# Patient Record
Sex: Female | Born: 1940
Health system: Southern US, Community
[De-identification: ages and names within clinical notes are randomized; demographics above are authoritative.]

## PROBLEM LIST (undated history)

## (undated) DIAGNOSIS — R32 Unspecified urinary incontinence: Secondary | ICD-10-CM

## (undated) DIAGNOSIS — J302 Other seasonal allergic rhinitis: Secondary | ICD-10-CM

## (undated) DIAGNOSIS — E079 Disorder of thyroid, unspecified: Secondary | ICD-10-CM

## (undated) DIAGNOSIS — M1990 Unspecified osteoarthritis, unspecified site: Secondary | ICD-10-CM

## (undated) DIAGNOSIS — E039 Hypothyroidism, unspecified: Secondary | ICD-10-CM

## (undated) HISTORY — DX: Disorder of thyroid, unspecified: E07.9

## (undated) HISTORY — PX: FOOT SURGERY: SHX648

## (undated) HISTORY — PX: DILATION AND CURETTAGE OF UTERUS: SHX78

## (undated) HISTORY — DX: Unspecified urinary incontinence: R32

## (undated) HISTORY — PX: TONSILLECTOMY: SUR1361

---

## 1999-04-18 ENCOUNTER — Other Ambulatory Visit: Admission: RE | Admit: 1999-04-18 | Discharge: 1999-04-18 | Payer: Self-pay | Admitting: Obstetrics and Gynecology

## 2000-07-14 ENCOUNTER — Other Ambulatory Visit: Admission: RE | Admit: 2000-07-14 | Discharge: 2000-07-14 | Payer: Self-pay | Admitting: Obstetrics and Gynecology

## 2001-07-14 ENCOUNTER — Other Ambulatory Visit: Admission: RE | Admit: 2001-07-14 | Discharge: 2001-07-14 | Payer: Self-pay | Admitting: Obstetrics and Gynecology

## 2002-08-24 ENCOUNTER — Other Ambulatory Visit: Admission: RE | Admit: 2002-08-24 | Discharge: 2002-08-24 | Payer: Self-pay | Admitting: Obstetrics and Gynecology

## 2003-08-28 ENCOUNTER — Other Ambulatory Visit: Admission: RE | Admit: 2003-08-28 | Discharge: 2003-08-28 | Payer: Self-pay | Admitting: Obstetrics and Gynecology

## 2008-12-28 ENCOUNTER — Ambulatory Visit (HOSPITAL_COMMUNITY): Admission: RE | Admit: 2008-12-28 | Discharge: 2008-12-28 | Payer: Self-pay | Admitting: Obstetrics and Gynecology

## 2008-12-28 ENCOUNTER — Encounter (INDEPENDENT_AMBULATORY_CARE_PROVIDER_SITE_OTHER): Payer: Self-pay | Admitting: Obstetrics and Gynecology

## 2010-12-07 ENCOUNTER — Encounter: Payer: Self-pay | Admitting: Obstetrics and Gynecology

## 2011-03-04 LAB — CBC
HCT: 39.8 % (ref 36.0–46.0)
Hemoglobin: 13.2 g/dL (ref 12.0–15.0)
MCHC: 33.3 g/dL (ref 30.0–36.0)
MCV: 96.6 fL (ref 78.0–100.0)
Platelets: 207 10*3/uL (ref 150–400)
RBC: 4.12 MIL/uL (ref 3.87–5.11)
RDW: 13.2 % (ref 11.5–15.5)
WBC: 6.8 10*3/uL (ref 4.0–10.5)

## 2011-04-01 NOTE — Op Note (Signed)
Brenda Ortiz, Brenda Ortiz           ACCOUNT NO.:  [id number]   MEDICAL RECORD NO.:  [id number]          PATIENT TYPE:  AMB   LOCATION:  SDC                           FACILITY:  [facility]   PHYSICIAN:  Lenoard Aden, M.D.DATE OF BIRTH:  November 08, 1941   DATE OF PROCEDURE:  12/28/2008  DATE OF DISCHARGE:                               OPERATIVE REPORT   PREOPERATIVE DIAGNOSIS:  Postmenopausal bleeding.   POSTOPERATIVE DIAGNOSIS:  Postmenopausal bleeding plus probable atrophic  endometrium.   PROCEDURE:  Diagnostic hysteroscopy, D and C   SURGEON:  Lenoard Aden, MD   ANESTHESIA:  MAC paracervical.   ESTIMATED BLOOD LOSS:  Less than 50 mL.   COMPLICATIONS:  None.   FLUID DEFICIT:  30 mL.  The patient to recovery in good condition.   SPECIMEN:  Endometrial curettings to pathology.   BRIEF OPERATIVE NOTE:  After being apprised of risks of anesthesia,  infection, bleeding, injury to organs, need for repair delayed versus  immediate complications secondary to uterine perforation with possible  need for repair.  The patient was brought to the operating room.  She  was administered IV sedation without difficulty, prepped and draped in  usual sterile fashion.  Catheterized until bladder was empty.  Speculum  placed to paracervical block, placed at 4 and 8 o'clock using a dilute  Marcaine solution 22 mL total, a dilute Pitressin solution placed at 3  and 9 o'clock to the cervicovaginal junction.  Good hemostasis is noted.  No intravascular extravasation was noted.  At this time, the cervix is  easily was sounded to 8 cm.  Uterus sounds to 8 cm.  Cervix easily  dilated up to #25 [facility] dilator.  Hysteroscope placed.  Visualization  reveals a nontraumatized endometrial cavity.  No evidence of  perforation.  Normal tubal ostia.  Normal thin anterior and posterior  endometrial wall.  No focal lesions noted.  D&C is then performed in a  four quadrant method using sharp curettage.   Revisualization reveals no  evidence of perforation, otherwise normal endometrial cavity.  Procedure  is therefore terminated.  All instruments removed.  The patient  tolerates procedure, was awakened and transferred to recovery in good  condition.      Lenoard Aden, M.D.  Electronically Signed     RJT/MEDQ  D:  12/28/2008  T:  12/28/2008  Job:  [id number]

## 2013-02-07 ENCOUNTER — Other Ambulatory Visit: Payer: Self-pay | Admitting: Gastroenterology

## 2013-11-03 ENCOUNTER — Encounter: Payer: Self-pay | Admitting: Podiatrist

## 2013-11-03 ENCOUNTER — Ambulatory Visit (INDEPENDENT_AMBULATORY_CARE_PROVIDER_SITE_OTHER): Payer: Medicare Other

## 2013-11-03 ENCOUNTER — Ambulatory Visit (INDEPENDENT_AMBULATORY_CARE_PROVIDER_SITE_OTHER): Payer: Medicare Other | Admitting: Podiatrist

## 2013-11-03 VITALS — BP 112/62 | HR 60 | Resp 16 | Ht 66.0 in | Wt 140.0 lb

## 2013-11-03 DIAGNOSIS — Q828 Other specified congenital malformations of skin: Secondary | ICD-10-CM

## 2013-11-03 DIAGNOSIS — R52 Pain, unspecified: Secondary | ICD-10-CM

## 2013-11-03 DIAGNOSIS — M216X9 Other acquired deformities of unspecified foot: Secondary | ICD-10-CM

## 2013-11-03 DIAGNOSIS — M2580 Other specified joint disorders, unspecified joint: Secondary | ICD-10-CM

## 2013-11-03 DIAGNOSIS — M948X9 Other specified disorders of cartilage, unspecified sites: Secondary | ICD-10-CM

## 2013-11-03 NOTE — Patient Instructions (Signed)
Try the little offloading pad in your shoe-- if it helps, a custom orthotic to wear in your shoes may also be beneficial.  Your small bone underneath your great toe joint may also be inflammed-- if the padding doesn't help we can try a steroid injection into the area to reduce the pain.

## 2013-11-03 NOTE — Progress Notes (Signed)
   Subjective:    Patient ID: Brenda Ortiz, female    DOB: 12-25-1940, 72 y.o.   MRN: [id number]   "My right Ortiz hurts where the callus is.  My left Ortiz hurts on the ball of the Ortiz, sharp pains go through it."  Ortiz Pain This is a new (Pain 1st Metatarsal B/L) problem. Episode onset: Couple of months. The problem occurs intermittently. The problem has been waxing and waning. Nothing aggravates the symptoms. She has tried nothing for the symptoms.      Review of Systems  Genitourinary: Positive for urgency.  All other systems reviewed and are negative.       Objective:   Physical Exam GENERAL APPEARANCE: Alert, conversant. Appropriately groomed. No acute distress.  VASCULAR: Pedal pulses palpable and strong bilateral.  Capillary refill time is immediate to all digits,  Proximal to distal cooling it warm to warm.  Digital hair growth is present bilateral  NEUROLOGIC: sensation is intact epicritically and protectively to 5.07 monofilament at 5/5 sites bilateral.  Light touch is intact bilateral, vibratory sensation intact bilateral, achilles tendon reflex is intact bilateral.  MUSCULOSKELETAL: acceptable muscle strength, tone and stability bilateral.  High arched Ortiz type is noted with forefoot cavus deformity present. Contracture of lesser digits with nonweightbearing is noted. She also has pain in the sesamoid region of the left Ortiz as well.  DERMATOLOGIC: Hyperkeratotic lesion submetatarsal region is noted due to the cavus deformity and significant pressure being put forth on the metatarsal heads. No breakdown in integument is seen.      Assessment & Plan:  Cavus Ortiz deformity bilateral, sesamoiditis, hyperkeratotic porokeratotic lesion Plan: Lightly debrided the hyperkeratotic lesion. Recommended an offloading pad and discussed if this is beneficial a custom orthotic would be helpful for her Ortiz. He would need to be a very thin orthotic as she likes to wear cute  shoes

## 2015-06-11 ENCOUNTER — Encounter (HOSPITAL_COMMUNITY): Payer: Self-pay | Admitting: Internal Medicine

## 2015-06-11 ENCOUNTER — Observation Stay (HOSPITAL_COMMUNITY)
Admission: AD | Admit: 2015-06-11 | Discharge: 2015-06-12 | Disposition: A | Discharge status: Home / Self Care | Payer: Medicare Other | Source: Ambulatory Visit | Attending: Internal Medicine | Admitting: Internal Medicine

## 2015-06-11 DIAGNOSIS — E876 Hypokalemia: Secondary | ICD-10-CM | POA: Diagnosis not present

## 2015-06-11 DIAGNOSIS — R079 Chest pain, unspecified: Principal | ICD-10-CM | POA: Diagnosis present

## 2015-06-11 DIAGNOSIS — R32 Unspecified urinary incontinence: Secondary | ICD-10-CM | POA: Insufficient documentation

## 2015-06-11 DIAGNOSIS — E039 Hypothyroidism, unspecified: Secondary | ICD-10-CM | POA: Diagnosis not present

## 2015-06-11 DIAGNOSIS — Z87891 Personal history of nicotine dependence: Secondary | ICD-10-CM | POA: Diagnosis not present

## 2015-06-11 LAB — COMPREHENSIVE METABOLIC PANEL
ALBUMIN: 4.1 g/dL (ref 3.5–5.0)
ALT: 16 U/L (ref 14–54)
ANION GAP: 8 (ref 5–15)
AST: 24 U/L (ref 15–41)
Alkaline Phosphatase: 45 U/L (ref 38–126)
BILIRUBIN TOTAL: 0.8 mg/dL (ref 0.3–1.2)
BUN: 12 mg/dL (ref 6–20)
CALCIUM: 9.3 mg/dL (ref 8.9–10.3)
CHLORIDE: 104 mmol/L (ref 101–111)
CO2: 29 mmol/L (ref 22–32)
Creatinine, Ser: 0.92 mg/dL (ref 0.44–1.00)
GFR calc non Af Amer: >60 mL/min (ref >60)
Glucose, Bld: 95 mg/dL (ref 65–99)
POTASSIUM: 3.4 mmol/L — AB (ref 3.5–5.1)
SODIUM: 141 mmol/L (ref 135–145)
Total Protein: 7.1 g/dL (ref 6.5–8.1)

## 2015-06-11 LAB — CBC WITH DIFFERENTIAL/PLATELET
Basophils Absolute: 0.1 10*3/uL (ref 0.0–0.1)
Basophils Relative: 1 % (ref 0–1)
EOS ABS: 0.4 10*3/uL (ref 0.0–0.7)
Eosinophils Relative: 6 % — ABNORMAL HIGH (ref 0–5)
HCT: 43.1 % (ref 36.0–46.0)
Hemoglobin: 14.4 g/dL (ref 12.0–15.0)
LYMPHS PCT: 38 % (ref 12–46)
Lymphs Abs: 2.7 10*3/uL (ref 0.7–4.0)
MCH: 31.9 pg (ref 26.0–34.0)
MCHC: 33.4 g/dL (ref 30.0–36.0)
MCV: 95.6 fL (ref 78.0–100.0)
Monocytes Absolute: 0.5 10*3/uL (ref 0.1–1.0)
Monocytes Relative: 6 % (ref 3–12)
Neutro Abs: 3.6 10*3/uL (ref 1.7–7.7)
Neutrophils Relative %: 49 % (ref 43–77)
PLATELETS: 222 10*3/uL (ref 150–400)
RBC: 4.51 MIL/uL (ref 3.87–5.11)
RDW: 13.4 % (ref 11.5–15.5)
WBC: 7.2 10*3/uL (ref 4.0–10.5)

## 2015-06-11 LAB — TROPONIN I: Troponin I: <0.03 ng/mL (ref <0.031)

## 2015-06-11 MED ORDER — LEVOTHYROXINE SODIUM 75 MCG PO TABS
75.0000 ug | ORAL_TABLET | Freq: Every day | ORAL | Status: DC
  Administered 2015-06-12: 75 ug via ORAL
  Filled 2015-06-11 (×2): qty 1

## 2015-06-11 MED ORDER — METHOCARBAMOL 500 MG PO TABS
500.0000 mg | ORAL_TABLET | Freq: Four times a day (QID) | ORAL | Status: DC | PRN
  Filled 2015-06-11: qty 1

## 2015-06-11 MED ORDER — DARIFENACIN HYDROBROMIDE ER 7.5 MG PO TB24
7.5000 mg | ORAL_TABLET | Freq: Every day | ORAL | Status: DC
  Administered 2015-06-12: 7.5 mg via ORAL
  Filled 2015-06-11: qty 1

## 2015-06-11 MED ORDER — POTASSIUM CHLORIDE CRYS ER 20 MEQ PO TBCR
20.0000 meq | EXTENDED_RELEASE_TABLET | Freq: Once | ORAL | Status: AC
  Administered 2015-06-11: 20 meq via ORAL
  Filled 2015-06-11: qty 1

## 2015-06-11 MED ORDER — SODIUM CHLORIDE 0.9 % IV SOLN
INTRAVENOUS | Status: AC
  Administered 2015-06-11: 20:00:00 via INTRAVENOUS

## 2015-06-11 MED ORDER — LISINOPRIL 5 MG PO TABS
5.0000 mg | ORAL_TABLET | Freq: Every day | ORAL | Status: DC
  Administered 2015-06-12: 5 mg via ORAL
  Filled 2015-06-11 (×2): qty 1

## 2015-06-11 MED ORDER — ATORVASTATIN CALCIUM 80 MG PO TABS
80.0000 mg | ORAL_TABLET | Freq: Every day | ORAL | Status: DC
  Filled 2015-06-11: qty 1

## 2015-06-11 MED ORDER — TRAMADOL HCL 50 MG PO TABS
50.0000 mg | ORAL_TABLET | Freq: Four times a day (QID) | ORAL | Status: DC | PRN
  Administered 2015-06-11 – 2015-06-12 (×2): 50 mg via ORAL
  Filled 2015-06-11 (×2): qty 1

## 2015-06-11 MED ORDER — ENOXAPARIN SODIUM 40 MG/0.4ML ~~LOC~~ SOLN
40.0000 mg | SUBCUTANEOUS | Status: DC
  Filled 2015-06-11 (×2): qty 0.4

## 2015-06-11 MED ORDER — ACETAMINOPHEN 325 MG PO TABS
650.0000 mg | ORAL_TABLET | Freq: Four times a day (QID) | ORAL | Status: DC | PRN
  Administered 2015-06-11: 650 mg via ORAL
  Filled 2015-06-11: qty 2

## 2015-06-11 MED ORDER — ACETAMINOPHEN 650 MG RE SUPP: 650.0000 mg | Freq: Four times a day (QID) | RECTAL | Status: DC | PRN

## 2015-06-11 MED ORDER — ESTROGENS CONJUGATED 0.625 MG PO TABS
0.6250 mg | ORAL_TABLET | Freq: Every day | ORAL | Status: DC
  Filled 2015-06-11: qty 1

## 2015-06-11 MED ORDER — SODIUM CHLORIDE 0.9 % IJ SOLN
3.0000 mL | Freq: Two times a day (BID) | INTRAMUSCULAR | Status: DC
  Administered 2015-06-11: 3 mL via INTRAVENOUS

## 2015-06-11 MED ORDER — CONJ ESTROG-MEDROXYPROGEST ACE 0.625-2.5 MG PO TABS: 1.0000 | ORAL_TABLET | Freq: Every day | ORAL | Status: DC

## 2015-06-11 MED ORDER — HYDRALAZINE HCL 20 MG/ML IJ SOLN: 10.0000 mg | Freq: Four times a day (QID) | INTRAMUSCULAR | Status: DC | PRN

## 2015-06-11 MED ORDER — MEDROXYPROGESTERONE ACETATE 2.5 MG PO TABS
2.5000 mg | ORAL_TABLET | Freq: Every day | ORAL | Status: DC
  Filled 2015-06-11: qty 1

## 2015-06-11 MED ORDER — ASPIRIN EC 325 MG PO TBEC
325.0000 mg | DELAYED_RELEASE_TABLET | Freq: Every day | ORAL | Status: DC
  Administered 2015-06-11 – 2015-06-12 (×2): 325 mg via ORAL
  Filled 2015-06-11 (×2): qty 1

## 2015-06-11 NOTE — Progress Notes (Signed)
Pt refusing lisinupril, BP 135/63, potassium 3.4 Dr. Selena Batten notified.

## 2015-06-11 NOTE — H&P (Signed)
Brenda Ortiz is an 74 y.o. female.    Pcp: Pearson Grippe   Chief Complaint:  Chest pain HPI: 74 yo female with hx of hypothyroidism c/o chest pain starting yesterday afternoon.  Lasting for about 10 hrs.  Pt states that the pain was left sided under breast and radiated around towards the back. Pt denies fever chills, cough palp, sob, n/v, heartburn.  EKG nsr at 66, nl axis, slight t inversion in the v1-3.  No prior ekg for comparison.  Pt was chest pain free at office and sent for evaluation to hospital.    Past Medical History  Diagnosis Date  . Thyroid disease   . Incontinence     Past Surgical History  Procedure Laterality Date  . Dilation and curettage of uterus    . Foot surgery Right   . Tonsillectomy      Family History  Problem Relation Age of Onset  . Arthritis Mother   . Arthritis Father   . Diabetes Father   . Diabetes Brother   . Diabetes Paternal Grandmother    Social History:  reports that she quit smoking about 44 years ago. Her smoking use included Cigarettes. She has a 5 pack-year smoking history. She has never used smokeless tobacco. She reports that she drinks alcohol. She reports that she does not use illicit drugs.  Allergies: No Known Allergies  Medications Prior to Admission  Medication Sig Dispense Refill  . calcium-vitamin D (OSCAL) 250-125 MG-UNIT per tablet Take 1 tablet by mouth daily.    Marland Kitchen PREMPRO 0.625-2.5 MG per tablet Take 1 tablet by mouth daily.     Marland Kitchen SYNTHROID 75 MCG tablet Take 75 mcg by mouth daily before breakfast.     . VESICARE 5 MG tablet Take 5 mg by mouth daily.       No results found for this or any previous visit (from the past 48 hour(s)). No results found.  Review of Systems  Constitutional: Negative.   HENT: Negative.   Eyes: Negative.   Respiratory: Negative.   Cardiovascular: Positive for chest pain. Negative for palpitations, orthopnea, claudication, leg swelling and PND.  Gastrointestinal: Negative.    Genitourinary: Negative.   Musculoskeletal: Negative.   Skin: Negative.   Neurological: Negative.   Endo/Heme/Allergies: Negative.   Psychiatric/Behavioral: Negative.     Blood pressure 166/73, pulse 63, temperature 97.7 F (36.5 C), temperature source Oral, resp. rate 18, height  (1.676 m), weight 65.046 kg (143 lb 6.4 oz), SpO2 100 %. Physical Exam  Constitutional: She is oriented to person, place, and time. She appears well-developed and well-nourished.  HENT:  Head: Normocephalic and atraumatic.  Mouth/Throat: No oropharyngeal exudate.  Eyes: Conjunctivae and EOM are normal. Pupils are equal, round, and reactive to light. No scleral icterus.  Neck: Normal range of motion. Neck supple. No JVD present. No tracheal deviation present. No thyromegaly present.  Cardiovascular: Normal rate and regular rhythm.  Exam reveals no gallop and no friction rub.   No murmur heard. Respiratory: Effort normal and breath sounds normal. No respiratory distress. She has no wheezes. She has no rales. She exhibits no tenderness.  GI: Soft. Bowel sounds are normal. She exhibits no distension. There is no tenderness. There is no rebound and no guarding.  Musculoskeletal: Normal range of motion. She exhibits no edema or tenderness.  Lymphadenopathy:    She has no cervical adenopathy.  Neurological: She is alert and oriented to person, place, and time. She has normal reflexes. She displays  normal reflexes. No cranial nerve deficit. She exhibits normal muscle tone. Coordination normal.  Skin: Skin is warm and dry. No rash noted. No erythema. No pallor.  Psychiatric: She has a normal mood and affect. Her behavior is normal. Judgment and thought content normal.     Assessment/Plan Cp Tele Trop i q6h x3 Aspirin, lipitor, lisinopril for bp control.  Discuss case with Ganji, outpatient stress testing if cardiac markers negative  Hypothyroidism Cont synthroid  Hypokalemia Replete Check cmp in  am  Pearson Grippe 06/11/2015, 6:47 PM

## 2015-06-12 ENCOUNTER — Observation Stay (HOSPITAL_COMMUNITY): Payer: Medicare Other

## 2015-06-12 DIAGNOSIS — R079 Chest pain, unspecified: Secondary | ICD-10-CM | POA: Diagnosis not present

## 2015-06-12 LAB — CBC WITH DIFFERENTIAL/PLATELET
BASOS PCT: 1 % (ref 0–1)
Basophils Absolute: 0.1 10*3/uL (ref 0.0–0.1)
Eosinophils Absolute: 0.4 10*3/uL (ref 0.0–0.7)
Eosinophils Relative: 7 % — ABNORMAL HIGH (ref 0–5)
HEMATOCRIT: 37.7 % (ref 36.0–46.0)
Hemoglobin: 12.5 g/dL (ref 12.0–15.0)
Lymphocytes Relative: 48 % — ABNORMAL HIGH (ref 12–46)
Lymphs Abs: 2.7 10*3/uL (ref 0.7–4.0)
MCH: 31.7 pg (ref 26.0–34.0)
MCHC: 33.2 g/dL (ref 30.0–36.0)
MCV: 95.7 fL (ref 78.0–100.0)
Monocytes Absolute: 0.5 10*3/uL (ref 0.1–1.0)
Monocytes Relative: 8 % (ref 3–12)
NEUTROS ABS: 2 10*3/uL (ref 1.7–7.7)
Neutrophils Relative %: 36 % — ABNORMAL LOW (ref 43–77)
Platelets: 185 10*3/uL (ref 150–400)
RBC: 3.94 MIL/uL (ref 3.87–5.11)
RDW: 13.5 % (ref 11.5–15.5)
WBC: 5.5 10*3/uL (ref 4.0–10.5)

## 2015-06-12 LAB — TROPONIN I: Troponin I: <0.03 ng/mL (ref <0.031)

## 2015-06-12 LAB — COMPREHENSIVE METABOLIC PANEL
ALT: 12 U/L — ABNORMAL LOW (ref 14–54)
AST: 17 U/L (ref 15–41)
Albumin: 3.1 g/dL — ABNORMAL LOW (ref 3.5–5.0)
Alkaline Phosphatase: 34 U/L — ABNORMAL LOW (ref 38–126)
Anion gap: 6 (ref 5–15)
BUN: 8 mg/dL (ref 6–20)
CO2: 27 mmol/L (ref 22–32)
Calcium: 8.1 mg/dL — ABNORMAL LOW (ref 8.9–10.3)
Chloride: 108 mmol/L (ref 101–111)
Creatinine, Ser: 0.8 mg/dL (ref 0.44–1.00)
GFR calc non Af Amer: >60 mL/min (ref >60)
Glucose, Bld: 96 mg/dL (ref 65–99)
Potassium: 3.9 mmol/L (ref 3.5–5.1)
Sodium: 141 mmol/L (ref 135–145)
Total Bilirubin: 0.6 mg/dL (ref 0.3–1.2)
Total Protein: 5.4 g/dL — ABNORMAL LOW (ref 6.5–8.1)

## 2015-06-12 LAB — LIPID PANEL
Cholesterol: 155 mg/dL (ref 0–200)
HDL: 66 mg/dL (ref >40)
LDL CALC: 80 mg/dL (ref 0–99)
TRIGLYCERIDES: 43 mg/dL (ref <150)
Total CHOL/HDL Ratio: 2.3 RATIO
VLDL: 9 mg/dL (ref 0–40)

## 2015-06-12 MED ORDER — ASPIRIN 325 MG PO TBEC: 325.0000 mg | DELAYED_RELEASE_TABLET | Freq: Every day | ORAL | Status: DC

## 2015-06-12 MED ORDER — ATORVASTATIN CALCIUM 80 MG PO TABS: 80.0000 mg | ORAL_TABLET | Freq: Every day | ORAL | Status: DC

## 2015-06-12 MED ORDER — TRAMADOL HCL 50 MG PO TABS: 50.0000 mg | ORAL_TABLET | Freq: Four times a day (QID) | ORAL | Status: DC | PRN

## 2015-06-12 MED ORDER — METHOCARBAMOL 500 MG PO TABS: 500.0000 mg | ORAL_TABLET | Freq: Four times a day (QID) | ORAL | Status: DC | PRN

## 2015-06-12 NOTE — Discharge Summary (Signed)
Physician Discharge Summary  Patient ID: Brenda Ortiz MRN: [id number] DOB/AGE: 07/17/1941 74 y.o.  Admit date: 06/11/2015 Discharge date: 06/12/2015  Admission Diagnoses:   Chest pain Hypothyroidism Hypokalemia Urinary incontinence  Discharge Diagnoses:  Principal Problem:   Chest pain Active Problems:   Hypothyroidism   Discharged Condition: stable  Hospital Course: 74 yo female with hypothryoidism c/o left sided chest pain starting 1 day prior to admission lasting for about 10 hrs.  Pt was seen in office and sent to hospital for chest pain r/o.  Discussed case with Dr. Jacinto Halim and has stress testing for Friday.  Cp free during admission. Trop markers were negative.  Potassium was repleted.  Pt was continued on aspirin and started on lipitor.   Has some mild left sided back pain, "muscle spasm" .  I warned the patient that there is a small chance of shingles in the differential diagnosis as she has had shingles in the past.  Pt will watch for any rash and let us know immediately if this occurs. Pt's markers were negative and she has follow up arranged as above.   Consults: None  Significant Diagnostic Studies: labs:   Treatments: aspirin, lipitor  Discharge Exam:   Blood pressure 130/66, pulse 60, temperature 97.3 F (36.3 C), temperature source Oral, resp. rate 18, height 5\' 6"  (1.676 m), weight 65.046 kg (143 lb 6.4 oz), SpO2 100 %. Heent: anicteric Neck: no jvd Heart: rrr s1, s2 Lung: ctab Abd: soft, nt, nd, +bs Ext: no c/c/e Skin:  No rash  A/P Cp, resolved Cont aspirin, lipitor Please f/u with Dr. Jerre Simon office for stress test on Friday,  I spoke with [name] to arrange  Hypokalemia Repleted,  Resolved  Hypothyroidism Cont levothyroxine  Back pain Robaxin, tramadol, tylenol prn Pt is aware to watch for signs of shingles  Disposition: Home     Medication List    TAKE these medications        aspirin 325 MG EC tablet  Take 1 tablet (325 mg  total) by mouth daily.     atorvastatin 80 MG tablet  Commonly known as:  LIPITOR  Take 1 tablet (80 mg total) by mouth daily at 6 PM.     calcium-vitamin D 250-125 MG-UNIT per tablet  Commonly known as:  OSCAL  Take 1 tablet by mouth daily.     methocarbamol 500 MG tablet  Commonly known as:  ROBAXIN  Take 1 tablet (500 mg total) by mouth every 6 (six) hours as needed for muscle spasms.     multivitamin with minerals Tabs tablet  Take 1 tablet by mouth daily.     PREMPRO 0.625-2.5 MG per tablet  Generic drug:  estrogen (conjugated)-medroxyprogesterone  Take 1 tablet by mouth daily.     SYNTHROID 75 MCG tablet  Generic drug:  levothyroxine  Take 75 mcg by mouth every morning.     traMADol 50 MG tablet  Commonly known as:  ULTRAM  Take 1 tablet (50 mg total) by mouth every 6 (six) hours as needed for severe pain.     VESICARE 5 MG tablet  Generic drug:  solifenacin  Take 5 mg by mouth daily.           Follow-up Information    Follow up with Pearson Grippe, MD In 3 weeks.   Specialty:  Internal Medicine   Contact information:   [address] [place] [address] [phone]       Follow up with Yates Decamp,  MD In 3 days.   Specialty:  Cardiology   Why:  for stress test   Contact information:   [address] [place] [address] [phone]       Signed: Pearson Grippe 06/12/2015, 9:12 AM

## 2015-06-12 NOTE — Discharge Instructions (Signed)
Please contact pcp if any further chest pain and follow up with Dr. Jacinto Halim

## 2016-07-22 ENCOUNTER — Other Ambulatory Visit: Payer: Self-pay | Admitting: Obstetrics and Gynecology

## 2016-08-07 NOTE — Patient Instructions (Signed)
Your procedure is scheduled on:08/14/16  Enter through the Main Entrance at :8am Pick up desk phone and dial [phone] and inform us of your arrival.  Please call [phone] if you have any problems the morning of surgery.  Remember: Do not eat food or drink liquids, including water, after midnight:WED.   You may brush your teeth the morning of surgery.  Take these meds the morning of surgery with a sip of water:Synthroid  DO NOT wear jewelry, eye make-up, lipstick,body lotion, or dark fingernail polish.  (Polished toes are ok) You may wear deodorant.   Patients discharged on the day of surgery will not be allowed to drive home. Wear loose fitting, comfortable clothes for your ride home.

## 2016-08-08 ENCOUNTER — Encounter (HOSPITAL_COMMUNITY): Payer: Self-pay

## 2016-08-08 ENCOUNTER — Encounter (HOSPITAL_COMMUNITY)
Admission: RE | Admit: 2016-08-08 | Discharge: 2016-08-08 | Disposition: A | Discharge status: Home / Self Care | Payer: Medicare Other | Source: Ambulatory Visit | Attending: Obstetrics and Gynecology | Admitting: Obstetrics and Gynecology

## 2016-08-08 DIAGNOSIS — Z01818 Encounter for other preprocedural examination: Secondary | ICD-10-CM | POA: Diagnosis not present

## 2016-08-08 HISTORY — DX: Hypothyroidism, unspecified: E03.9

## 2016-08-08 LAB — CBC
HCT: 40.7 % (ref 36.0–46.0)
Hemoglobin: 14.1 g/dL (ref 12.0–15.0)
MCH: 32 pg (ref 26.0–34.0)
MCHC: 34.6 g/dL (ref 30.0–36.0)
MCV: 92.5 fL (ref 78.0–100.0)
Platelets: 234 10*3/uL (ref 150–400)
RBC: 4.4 MIL/uL (ref 3.87–5.11)
RDW: 13.9 % (ref 11.5–15.5)
WBC: 6 10*3/uL (ref 4.0–10.5)

## 2016-08-13 MED ORDER — CEFAZOLIN SODIUM-DEXTROSE 2-4 GM/100ML-% IV SOLN
2.0000 g | INTRAVENOUS | Status: AC
Start: 2016-08-14 — End: 2016-08-14
  Administered 2016-08-14: 2 g via INTRAVENOUS

## 2016-08-14 ENCOUNTER — Ambulatory Visit (HOSPITAL_COMMUNITY)
Admission: RE | Admit: 2016-08-14 | Discharge: 2016-08-14 | Disposition: A | Discharge status: Home / Self Care | Payer: Medicare Other | Source: Ambulatory Visit | Attending: Obstetrics and Gynecology | Admitting: Obstetrics and Gynecology

## 2016-08-14 ENCOUNTER — Encounter (HOSPITAL_COMMUNITY)
Admission: RE | Disposition: A | Discharge status: Home / Self Care | Payer: Self-pay | Source: Ambulatory Visit | Attending: Obstetrics and Gynecology

## 2016-08-14 ENCOUNTER — Encounter (HOSPITAL_COMMUNITY): Payer: Self-pay

## 2016-08-14 ENCOUNTER — Ambulatory Visit (HOSPITAL_COMMUNITY): Payer: Medicare Other | Admitting: Anesthesiology

## 2016-08-14 DIAGNOSIS — N840 Polyp of corpus uteri: Secondary | ICD-10-CM | POA: Diagnosis not present

## 2016-08-14 DIAGNOSIS — N950 Postmenopausal bleeding: Secondary | ICD-10-CM | POA: Diagnosis present

## 2016-08-14 DIAGNOSIS — Z7982 Long term (current) use of aspirin: Secondary | ICD-10-CM | POA: Insufficient documentation

## 2016-08-14 DIAGNOSIS — Z87891 Personal history of nicotine dependence: Secondary | ICD-10-CM | POA: Diagnosis not present

## 2016-08-14 HISTORY — PX: HYSTEROSCOPY WITH D & C: SHX1775

## 2016-08-14 SURGERY — DILATATION AND CURETTAGE /HYSTEROSCOPY
Anesthesia: General | Site: Vagina

## 2016-08-14 MED ORDER — LACTATED RINGERS IV SOLN
INTRAVENOUS | Status: DC
Start: 2016-08-14 — End: 2016-08-14
  Administered 2016-08-14 (×2): via INTRAVENOUS

## 2016-08-14 MED ORDER — PROPOFOL 10 MG/ML IV BOLUS
INTRAVENOUS | Status: DC | PRN
Start: 2016-08-14 — End: 2016-08-14
  Administered 2016-08-14: 200 mg via INTRAVENOUS

## 2016-08-14 MED ORDER — HYDROMORPHONE HCL 1 MG/ML IJ SOLN
INTRAMUSCULAR | Status: AC
  Filled 2016-08-14: qty 1

## 2016-08-14 MED ORDER — ONDANSETRON HCL 4 MG/2ML IJ SOLN: 4.0000 mg | Freq: Once | INTRAMUSCULAR | Status: DC | PRN

## 2016-08-14 MED ORDER — BUPIVACAINE HCL (PF) 0.25 % IJ SOLN
INTRAMUSCULAR | Status: AC
  Filled 2016-08-14: qty 30

## 2016-08-14 MED ORDER — LIDOCAINE HCL (CARDIAC) 20 MG/ML IV SOLN
INTRAVENOUS | Status: DC | PRN
  Administered 2016-08-14: 100 mg via INTRAVENOUS

## 2016-08-14 MED ORDER — MIDAZOLAM HCL 5 MG/5ML IJ SOLN: INTRAMUSCULAR | Status: DC | PRN

## 2016-08-14 MED ORDER — LACTATED RINGERS IR SOLN
Status: DC | PRN
  Administered 2016-08-14: 3000 mL

## 2016-08-14 MED ORDER — BUPIVACAINE HCL (PF) 0.25 % IJ SOLN
INTRAMUSCULAR | Status: DC | PRN
  Administered 2016-08-14: 20 mL

## 2016-08-14 MED ORDER — TRAMADOL HCL 50 MG PO TABS: 50.0000 mg | ORAL_TABLET | Freq: Four times a day (QID) | ORAL | 0 refills | Status: DC | PRN

## 2016-08-14 MED ORDER — KETOROLAC TROMETHAMINE 30 MG/ML IJ SOLN
INTRAMUSCULAR | Status: DC | PRN
  Administered 2016-08-14: 30 mg via INTRAVENOUS

## 2016-08-14 MED ORDER — ONDANSETRON HCL 4 MG/2ML IJ SOLN
INTRAMUSCULAR | Status: DC | PRN
  Administered 2016-08-14: 4 mg via INTRAVENOUS

## 2016-08-14 MED ORDER — LIDOCAINE HCL (CARDIAC) 20 MG/ML IV SOLN
INTRAVENOUS | Status: AC
  Filled 2016-08-14: qty 5

## 2016-08-14 MED ORDER — KETOROLAC TROMETHAMINE 30 MG/ML IJ SOLN
INTRAMUSCULAR | Status: AC
  Filled 2016-08-14: qty 1

## 2016-08-14 MED ORDER — FENTANYL CITRATE (PF) 100 MCG/2ML IJ SOLN: 25.0000 ug | INTRAMUSCULAR | Status: DC | PRN

## 2016-08-14 MED ORDER — DEXAMETHASONE SODIUM PHOSPHATE 10 MG/ML IJ SOLN
INTRAMUSCULAR | Status: AC
  Filled 2016-08-14: qty 1

## 2016-08-14 MED ORDER — DEXAMETHASONE SODIUM PHOSPHATE 4 MG/ML IJ SOLN
INTRAMUSCULAR | Status: DC | PRN
  Administered 2016-08-14: 10 mg via INTRAVENOUS

## 2016-08-14 MED ORDER — ONDANSETRON HCL 4 MG/2ML IJ SOLN
INTRAMUSCULAR | Status: AC
  Filled 2016-08-14: qty 2

## 2016-08-14 MED ORDER — FENTANYL CITRATE (PF) 100 MCG/2ML IJ SOLN
INTRAMUSCULAR | Status: AC
  Filled 2016-08-14: qty 2

## 2016-08-14 MED ORDER — PROPOFOL 10 MG/ML IV BOLUS
INTRAVENOUS | Status: AC
  Filled 2016-08-14: qty 20

## 2016-08-14 SURGICAL SUPPLY — 15 items
CANISTER SUCT 3000ML (MISCELLANEOUS) ×3 IMPLANT
CATH ROBINSON RED A/P 16FR (CATHETERS) ×3 IMPLANT
CLOTH BEACON ORANGE TIMEOUT ST (SAFETY) ×3 IMPLANT
CONTAINER PREFILL 10% NBF 60ML (FORM) ×3 IMPLANT
GLOVE BIO SURGEON STRL SZ7.5 (GLOVE) ×3 IMPLANT
GLOVE BIOGEL PI IND STRL 7.0 (GLOVE) ×1 IMPLANT
GLOVE BIOGEL PI INDICATOR 7.0 (GLOVE) ×2
GOWN STRL REUS W/TWL LRG LVL3 (GOWN DISPOSABLE) ×9 IMPLANT
PACK VAGINAL MINOR WOMEN LF (CUSTOM PROCEDURE TRAY) ×3 IMPLANT
PAD OB MATERNITY 4.3X12.25 (PERSONAL CARE ITEMS) ×3 IMPLANT
SYR TB 1ML 25GX5/8 (SYRINGE) IMPLANT
TOWEL OR 17X24 6PK STRL BLUE (TOWEL DISPOSABLE) ×6 IMPLANT
TUBING AQUILEX INFLOW (TUBING) ×3 IMPLANT
TUBING AQUILEX OUTFLOW (TUBING) ×3 IMPLANT
WATER STERILE IRR 1000ML POUR (IV SOLUTION) ×3 IMPLANT

## 2016-08-14 NOTE — Transfer of Care (Signed)
Immediate Anesthesia Transfer of Care Note  Patient: Brenda Ortiz  Procedure(s) Performed: Procedure(s): DILATATION AND CURETTAGE /HYSTEROSCOPY (N/A)  Patient Location: PACU  Anesthesia Type:General  Level of Consciousness: awake, alert  and oriented  Airway & Oxygen Therapy: Patient Spontanous Breathing and Patient connected to nasal cannula oxygen  Post-op Assessment: Report given to RN and Post -op Vital signs reviewed and stable  Post vital signs: Reviewed and stable  Last Vitals:  Vitals:   08/14/16 0821  BP: (!) 151/55  Pulse: 69  Resp: 14  Temp: 36.4 C    Last Pain:  Vitals:   08/14/16 0821  TempSrc: Oral      Patients Stated Pain Goal:  (pt doesn't wany anything for pain narcotic wise) (08/14/16 16100821)  Complications: No apparent anesthesia complications

## 2016-08-14 NOTE — Op Note (Signed)
08/14/2016  10:44 AM  PATIENT:  Epimenio FootMargaret B Ranes  75 y.o. female  PRE-OPERATIVE DIAGNOSIS:  Postmenopausal Bleeding  POST-OPERATIVE DIAGNOSIS:  Postmenopausal Bleeding  PROCEDURE:  Procedure(s): DILATATION AND CURETTAGE /HYSTEROSCOPY REMOVAL of endometrial polyp  SURGEON:  Surgeon(s): Olivia Mackieichard Teralyn Mullins, MD  ASSISTANTS: none   ANESTHESIA:   local and general  ESTIMATED BLOOD LOSS: minimal  DRAINS: none   LOCAL MEDICATIONS USED:  MARCAINE    and Amount: 20 ml  SPECIMEN:  Source of Specimen:  EMC and polyp  DISPOSITION OF SPECIMEN:  PATHOLOGY  COUNTS:  YES  DICTATION #: [id number]  PLAN OF CARE: dc home  PATIENT DISPOSITION:  PACU - hemodynamically stable.

## 2016-08-14 NOTE — Discharge Instructions (Signed)
DISCHARGE INSTRUCTIONS: HYSTEROSCOPY The following instructions have been prepared to help you care for yourself upon your return home.  May take Ibuprofen after 4:00 pm 08/14/16.   Personal hygiene:  Use sanitary pads for vaginal drainage, not tampons.  Shower the day after your procedure.  NO tub baths, pools or Jacuzzis for 2-3 weeks.  Wipe front to back after using the bathroom.  Activity and limitations:  Do NOT drive or operate any equipment for 24 hours. The effects of anesthesia are still present and drowsiness may result.  Do NOT rest in bed all day.  Walking is encouraged.  Walk up and down stairs slowly.  You may resume your normal activity in one to two days or as indicated by your physician. Sexual activity: NO intercourse for at least 2 weeks after the procedure, or as indicated by your Doctor.  Diet: Eat a light meal as desired this evening. You may resume your usual diet tomorrow.  Return to Work: You may resume your work activities in one to two days or as indicated by Therapist, sportsyour Doctor.  What to expect after your surgery: Expect to have vaginal bleeding/discharge for 2-3 days and spotting for up to 10 days. It is not unusual to have soreness for up to 1-2 weeks. You may have a slight burning sensation when you urinate for the first day. Mild cramps may continue for a couple of days. You may have a regular period in 2-6 weeks.  Call your doctor for any of the following:  Excessive vaginal bleeding or clotting, saturating and changing one pad every hour.  Inability to urinate 6 hours after discharge from hospital.  Pain not relieved by pain medication.  Fever of 100.4 F or greater.  Unusual vaginal discharge or odor.  Return to office _________________Call for an appointment ___________________ Patients signature: ______________________ Nurses signature ________________________  Post Anesthesia Care Unit [phone]

## 2016-08-14 NOTE — H&P (Signed)
Brenda FootMargaret B Ortiz is an 75 y.o. female with PMB. Nl ebx. Recurrent PMB. Diag HS today.  Pertinent Gynecological History: Menses: post-menopausal Bleeding: post menopausal bleeding Contraception: none DES exposure: denies Blood transfusions: none Sexually transmitted diseases: no past history Previous GYN Procedures: na  Last mammogram: normal Date: 2017 Last pap: normal Date: 2017 OB History: G2, P2   Menstrual History: Menarche age: [age] No LMP recorded. Patient is postmenopausal.    Past Medical History:  Diagnosis Date  . Hypothyroidism   . Incontinence   . Thyroid disease     Past Surgical History:  Procedure Laterality Date  . DILATION AND CURETTAGE OF UTERUS    . Ortiz SURGERY Right   . TONSILLECTOMY      Family History  Problem Relation Age of Onset  . Arthritis Mother   . Arthritis Father   . Diabetes Father   . Diabetes Brother   . Diabetes Paternal Grandmother     Social History:  reports that she quit smoking about 45 years ago. Her smoking use included Cigarettes. She has a 5.00 pack-year smoking history. She has never used smokeless tobacco. She reports that she drinks alcohol. She reports that she does not use drugs.  Allergies: No Known Allergies  Prescriptions Prior to Admission  Medication Sig Dispense Refill Last Dose  . calcium-vitamin D (OSCAL) 250-125 MG-UNIT per tablet Take 1 tablet by mouth daily.   08/13/2016 at Unknown time  . mirabegron ER (MYRBETRIQ) 50 MG TB24 tablet Take 50 mg by mouth every morning.   08/13/2016 at Unknown time  . Multiple Vitamin (MULTIVITAMIN WITH MINERALS) TABS tablet Take 1 tablet by mouth daily.   08/13/2016 at Unknown time  . PREMPRO 0.625-2.5 MG per tablet Take 1 tablet by mouth daily.    08/13/2016 at Unknown time  . SYNTHROID 75 MCG tablet Take 75 mcg by mouth every morning.    08/14/2016 at 0715  . aspirin EC 325 MG EC tablet Take 1 tablet (325 mg total) by mouth daily. 30 tablet 0 Unknown at Unknown time  .  atorvastatin (LIPITOR) 80 MG tablet Take 1 tablet (80 mg total) by mouth daily at 6 PM. (Patient not taking: Reported on 08/06/2016) 30 tablet 0 Not Taking at Unknown time  . methocarbamol (ROBAXIN) 500 MG tablet Take 1 tablet (500 mg total) by mouth every 6 (six) hours as needed for muscle spasms. (Patient not taking: Reported on 08/06/2016) 60 tablet 0 Not Taking at Unknown time  . traMADol (ULTRAM) 50 MG tablet Take 1 tablet (50 mg total) by mouth every 6 (six) hours as needed for severe pain. (Patient not taking: Reported on 08/06/2016) 30 tablet 0 Not Taking at Unknown time    Review of Systems  Constitutional: Negative.   All other systems reviewed and are negative.   There were no vitals taken for this visit. Physical Exam  Nursing note and vitals reviewed. Constitutional: She is oriented to person, place, and time. She appears well-developed and well-nourished.  HENT:  Head: Normocephalic and atraumatic.  Neck: Normal range of motion. Neck supple. No thyromegaly present.  Cardiovascular: Normal rate and regular rhythm.   Respiratory: Effort normal and breath sounds normal.  GI: Soft. Bowel sounds are normal.  Genitourinary: Vagina normal. Rectal exam shows guaiac positive stool.  Musculoskeletal: Normal range of motion.  Neurological: She is alert and oriented to person, place, and time. She has normal reflexes.  Skin: Skin is warm and dry.  Psychiatric: She has a normal mood  and affect.    No results found for this or any previous visit (from the past 24 hour(s)).  No results found.  Assessment/Plan: PMB- recurrrent- nl sono, nl ebx Diag HS with D&C. Consent done.  Brenda Ortiz J 08/14/2016, 8:29 AM

## 2016-08-14 NOTE — Anesthesia Preprocedure Evaluation (Signed)
Anesthesia Evaluation  Patient identified by MRN, date of birth, ID band Patient awake    Reviewed: Allergy & Precautions, NPO status , Patient's Chart, lab work & pertinent test results  History of Anesthesia Complications Negative for: history of anesthetic complications  Airway Mallampati: II  TM Distance: >3 FB Neck ROM: Full    Dental no notable dental hx. (+) Dental Advisory Given   Pulmonary neg pulmonary ROS, former smoker,    Pulmonary exam normal breath sounds clear to auscultation       Cardiovascular negative cardio ROS Normal cardiovascular exam Rhythm:Regular Rate:Normal     Neuro/Psych negative neurological ROS  negative psych ROS   GI/Hepatic negative GI ROS, Neg liver ROS,   Endo/Other  Hypothyroidism   Renal/GU negative Renal ROS  negative genitourinary   Musculoskeletal negative musculoskeletal ROS (+)   Abdominal   Peds negative pediatric ROS (+)  Hematology negative hematology ROS (+)   Anesthesia Other Findings   Reproductive/Obstetrics negative OB ROS                             Anesthesia Physical Anesthesia Plan  ASA: II  Anesthesia Plan: General   Post-op Pain Management:    Induction: Intravenous  Airway Management Planned: LMA  Additional Equipment:   Intra-op Plan:   Post-operative Plan: Extubation in OR  Informed Consent: I have reviewed the patients History and Physical, chart, labs and discussed the procedure including the risks, benefits and alternatives for the proposed anesthesia with the patient or authorized representative who has indicated his/her understanding and acceptance.   Dental advisory given  Plan Discussed with: CRNA  Anesthesia Plan Comments:         Anesthesia Quick Evaluation

## 2016-08-14 NOTE — Anesthesia Procedure Notes (Signed)
Procedure Name: LMA Insertion Date/Time: 08/14/2016 9:40 AM Performed by: Junious SilkGILBERT, Alondria Mousseau Pre-anesthesia Checklist: Patient identified, Emergency Drugs available, Suction available, Patient being monitored and Timeout performed Patient Re-evaluated:Patient Re-evaluated prior to inductionOxygen Delivery Method: Circle system utilized Preoxygenation: Pre-oxygenation with 100% oxygen Intubation Type: IV induction LMA: LMA inserted LMA Size: 4.0 Number of attempts: 1 Placement Confirmation: positive ETCO2,  CO2 detector and breath sounds checked- equal and bilateral Tube secured with: Tape Dental Injury: Teeth and Oropharynx as per pre-operative assessment

## 2016-08-14 NOTE — Progress Notes (Signed)
Patient seen and examined. Consent witnessed and signed. No changes noted. Update completed.Patient ID: Brenda FootMargaret B Ortiz, female   DOB: 1941/06/19, 75 y.o.   MRN: [id number]

## 2016-08-15 ENCOUNTER — Encounter (HOSPITAL_COMMUNITY): Payer: Self-pay | Admitting: Obstetrics and Gynecology

## 2016-08-15 NOTE — Op Note (Signed)
[facility]:  Ortiz, Brenda           ACCOUNT NO.:  [id number]  MEDICAL RECORD NO.:  [id number]  LOCATION:  [facility]                          FACILITY:  [facility]  PHYSICIAN:  Lenoard Adenichard J. Treniyah Lynn, M.D.DATE OF BIRTH:  [birth year]  DATE OF PROCEDURE: DATE OF DISCHARGE:  08/14/2016                              OPERATIVE REPORT   PREOPERATIVE DIAGNOSIS:  Postmenopausal bleeding with negative endometrial biopsy and refractory bleeding.  POSTOPERATIVE DIAGNOSIS:  Postmenopausal bleeding with negative endometrial biopsy and refractory bleeding plus small left endometrial polyp.  PROCEDURE:  Diagnostic hysteroscopy, D and C, endometrial polypectomy.  SURGEON:  Lenoard Adenichard J. Fedra Lanter, M.D.  ASSISTANT:  None.  ANESTHESIA:  General and local.  ESTIMATED BLOOD LOSS:  Less than 50 mL.  FLUID DEFICIT:  75 mL.  COMPLICATIONS:  None.  COUNTS:  Correct.  DISPOSITION:  The patient to recovery in good condition.  BRIEF OPERATIVE NOTE:  After being apprised of risks of anesthesia, infection, bleeding, injury to surrounding organs, possible need for repair, delayed versus immediate complications to include bowel and bladder injury, possible need for repair, the patient was brought to the operating room, and she was administered general anesthetic without complications.  She was prepped and draped in usual sterile fashion. Catheterized until the bladder was empty.  Exam under anesthesia reveals an anteflexed uterus and no adnexal masses appreciated.  No rectovaginal nodularity noted.  At this time, bivalve speculum placed, dilute Marcaine solution, placed 20 mL total standard paracervical block. Cervix easily dilated up to a #23 Pratt dilator.  Hysteroscope placed. Visualization reveals a thin endometrium with 1 small polypoid area along the lateral uterine wall.  By the left tubal ostia, which was removed using biopsy forceps and sent with the endometrial curettings which are correct collected using  sharp curettage in a four-quadrant method.  At this time, all instruments removed.  Good hemostasis was noted.  Fluid deficit as noted.  The patient tolerated the procedure well, was awakened, and transferred to recovery in good condition.     Lenoard Adenichard J. Bohdi Leeds, M.D.     RJT/MEDQ  D:  08/14/2016  T:  08/15/2016  Job:  [id number]  cc:   Lenoard Adenichard J. Mahealani Sulak, M.D. Fax: [phone]

## 2016-08-15 NOTE — Anesthesia Postprocedure Evaluation (Signed)
Anesthesia Post Note  Patient: Brenda Ortiz  Procedure(s) Performed: Procedure(s) (LRB): DILATATION AND CURETTAGE /HYSTEROSCOPY (N/A)  Anesthesia Type: General Level of consciousness: awake and alert Pain management: pain level controlled Vital Signs Assessment: post-procedure vital signs reviewed and stable Respiratory status: spontaneous breathing, nonlabored ventilation, respiratory function stable and patient connected to nasal cannula oxygen Cardiovascular status: blood pressure returned to baseline and stable Postop Assessment: no signs of nausea or vomiting Anesthetic complications: no    Last Vitals:  Vitals:   08/14/16 1115 08/14/16 1150  BP: (!) 150/73 (!) 134/92  Pulse: (!) 57 (!) 57  Resp: 14 14  Temp: 36.6 C 36.5 C    Last Pain:  Vitals:   08/14/16 0821  TempSrc: Oral                 Byren Pankow JENNETTE

## 2017-03-16 ENCOUNTER — Encounter: Payer: Self-pay | Admitting: Podiatry

## 2017-03-16 ENCOUNTER — Ambulatory Visit (INDEPENDENT_AMBULATORY_CARE_PROVIDER_SITE_OTHER): Payer: Medicare Other

## 2017-03-16 ENCOUNTER — Ambulatory Visit (INDEPENDENT_AMBULATORY_CARE_PROVIDER_SITE_OTHER): Payer: Medicare Other | Admitting: Podiatry

## 2017-03-16 VITALS — BP 134/92 | HR 73

## 2017-03-16 DIAGNOSIS — M79673 Pain in unspecified foot: Secondary | ICD-10-CM

## 2017-03-16 DIAGNOSIS — L84 Corns and callosities: Secondary | ICD-10-CM

## 2017-03-16 DIAGNOSIS — Q667 Congenital pes cavus, unspecified foot: Secondary | ICD-10-CM

## 2017-03-16 DIAGNOSIS — R52 Pain, unspecified: Secondary | ICD-10-CM

## 2017-03-16 NOTE — Progress Notes (Signed)
   Subjective:    Patient ID: Brenda Ortiz, female    DOB: June 15, 1941, 76 y.o.   MRN: [id number]  HPI 76 year old female presents the office today for concerns of high arch feet which been ongoing her entire life and she's had foot pain majority of her life because of her foot type. He did have heel surgery when she was younger but no other foot surgery. She gets painful calluses which have been worsening. She Is interested in orthotics. No other complaints.    Review of Systems  All other systems reviewed and are negative.      Objective:   Physical Exam General: AAO x3, NAD  Dermatological: Hyperkertotic lesions bilateral submetatarsal 1 and 5. No underlying ulceration, drainage or signs of infection. No open lesions.   Vascular: Dorsalis Pedis artery and Posterior Tibial artery pedal pulses are 2/4 bilateral with immedate capillary fill time. There is no pain with calf compression, swelling, warmth, erythema.   Neruologic: Grossly intact via light touch bilateral. Vibratory intact via tuning fork bilateral. Protective threshold with Semmes Wienstein monofilament intact to all pedal sites bilateral.   Musculoskeletal: Cavus foot type is present. Tenderness to the hyperkeratotic lesions. No other areas of tenderness are identified.  Muscular strength 5/5 in all groups tested bilateral.  Gait: Unassisted, Nonantalgic.     Assessment & Plan:  76 year old female with symptomatic hyperkeratotic lesions due to cavus foot type -Treatment options discussed including all alternatives, risks, and complications -Etiology of symptoms were discussed -Lesions sharply debrided without complications.  -She would like to proceed with orthotics. She was measured for them today and sent to [facility] labs. Will offload the callus areas.  -RTC in 3 weeks to PUO or sooner if needed.  Ovid Curd, DPM

## 2017-04-09 ENCOUNTER — Other Ambulatory Visit: Payer: Medicare Other

## 2017-04-20 ENCOUNTER — Telehealth: Payer: Self-pay | Admitting: *Deleted

## 2017-04-20 NOTE — Telephone Encounter (Signed)
Called and left message to reschedule missed appt to pick up orthotics

## 2017-04-23 ENCOUNTER — Other Ambulatory Visit: Payer: Medicare Other | Admitting: Orthotics

## 2017-07-13 ENCOUNTER — Ambulatory Visit (INDEPENDENT_AMBULATORY_CARE_PROVIDER_SITE_OTHER): Payer: Medicare Other | Admitting: Sports Medicine

## 2017-07-13 ENCOUNTER — Ambulatory Visit
Admission: RE | Admit: 2017-07-13 | Discharge: 2017-07-13 | Disposition: A | Discharge status: Home / Self Care | Payer: Medicare Other | Source: Ambulatory Visit | Attending: Sports Medicine | Admitting: Sports Medicine

## 2017-07-13 VITALS — BP 116/80 | Ht 66.0 in | Wt 142.0 lb

## 2017-07-13 DIAGNOSIS — M25551 Pain in right hip: Secondary | ICD-10-CM

## 2017-07-13 NOTE — Progress Notes (Signed)
   Subjective:    Patient ID: Brenda Ortiz, female    DOB: 01-06-1941, 76 y.o.   MRN: [id number]  HPI 76 yo female presenting with R hip and thigh pain x 4-6 weeks duration.  No history of falls or injury.  She notes the pain is worse when going from a seated to standing position and with climbing stairs.  She lives in a 3-story home so she has been climbing stairs daily.  She notes most of her pain occurs at nighttime when she lies down.  She reports she has had greater trochanteric bursitis in the past.  Advil helps some, has not tried applying ice or heating pads.  Denies radicular pain but reports sometimes her right thigh also feels sore.  Denies back pain.  Denies numbness and tingling.    Review of Systems  Musculoskeletal: Negative for back pain and joint swelling.  Neurological: Negative for numbness.      Objective:   Physical Exam  76 yo female, NAD, sitting comfortably in exam room.   BP 116/80   Ht 5\' 6"  (1.676 m)   Wt 142 lb (64.4 kg)   BMI 22.92 kg/m   Right hip: Full ROM bilaterally.  IR: 80 Deg, ER: 80 Deg, Flexion: 120 Deg, Extension: 100 Deg, Abduction: 45 Deg, Adduction: 45 Deg Strength IR: 5/5, ER: 5/5, Flexion: 5/5, Extension: 5/5, Abduction: 5/5, Adduction: 5/5 Strength 4/5 with resistance against abduction.  Pelvic alignment unremarkable to inspection and palpation. Standing hip rotation and gait without unsteadiness. Greater trochanter without tenderness to palpation   Mild tenderness to palpation over gluteus medius.  No SI joint tenderness and normal minimal SI movement.    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  No tenderness to palpation over greater troch to suggest bursitis.  Possible she may have some underlying arthritic changes also contributing to her pain, however less likely given absence of groin pain.  -will obtain AP and lateral XR to r/o arthritis  -Recommend PT: 1-2 visits for  pelvic stabilization and hip strengthening home exercise program  -follow up in 6 weeks   Patient seen and evaluated with the resident. I agree with the above plan of care. X-rays were reviewed. There are no significant degenerative changes seen. Proceed with physical therapy as noted above and follow-up with me in 6 weeks. Call with questions or concerns in the interim.

## 2017-07-14 DIAGNOSIS — M25551 Pain in right hip: Secondary | ICD-10-CM | POA: Insufficient documentation

## 2017-07-14 NOTE — Assessment & Plan Note (Signed)
Likely 2/2 gluteus medius tendinopathy given location.  No tenderness to palpation over greater troch to suggest bursitis.  Possible she may have some underlying arthritic changes also contributing to her pain, however less likely given absence of groin pain.  -will obtain AP and lateral XR to r/o arthritis  -Recommend PT: 1-2 visits for pelvic stabilization and hip strengthening home exercise program  -follow up in 6 weeks

## 2017-07-21 ENCOUNTER — Ambulatory Visit: Payer: Medicare Other | Attending: Sports Medicine | Admitting: Physical Therapy

## 2017-07-21 ENCOUNTER — Encounter: Payer: Self-pay | Admitting: Physical Therapy

## 2017-07-21 DIAGNOSIS — M6281 Muscle weakness (generalized): Secondary | ICD-10-CM | POA: Insufficient documentation

## 2017-07-21 DIAGNOSIS — M25551 Pain in right hip: Secondary | ICD-10-CM | POA: Insufficient documentation

## 2017-07-21 DIAGNOSIS — R262 Difficulty in walking, not elsewhere classified: Secondary | ICD-10-CM | POA: Insufficient documentation

## 2017-07-21 NOTE — Therapy (Signed)
[facility] Outpatient Rehabilitation [facility] [address] [place], [place], [address] Phone: [phone]   Fax:  [phone]  Physical Therapy Evaluation  Patient Details  Name: Brenda Ortiz MRN: [id number] Date of Birth: 1941/03/01 Referring Provider: Dr. Reino Bellis   Encounter Date: 07/21/2017      PT End of Session - 07/21/17 1533    Visit Number 1   Number of Visits 8   Date for PT Re-Evaluation 09/15/17   PT Start Time 0933   PT Stop Time 1015   PT Time Calculation (min) 42 min   Activity Tolerance Patient tolerated treatment well   Behavior During Therapy [facility] for tasks assessed/performed      Past Medical History:  Diagnosis Date  . Hypothyroidism   . Incontinence   . Thyroid disease     Past Surgical History:  Procedure Laterality Date  . DILATION AND CURETTAGE OF UTERUS    . FOOT SURGERY Right   . HYSTEROSCOPY W/D&C N/A 08/14/2016   Procedure: DILATATION AND CURETTAGE /HYSTEROSCOPY;  Surgeon: Olivia Mackie, MD;  Location: [facility];  Service: Gynecology;  Laterality: N/A;  . TONSILLECTOMY      There were no vitals filed for this visit.       Subjective Assessment - 07/21/17 0940    Subjective Patient has a 2-3 month history of right hip pain that raidiates into her right thigh. She has increased pain when she goes up stairs and when she sits.    Patient is accompained by: Interpreter   Pertinent History Has had bursitis in the past    Limitations Sitting;Standing;House hold activities  in and out of the car    How long can you sit comfortably? If she sits for too long she has some pain when standing    How long can you stand comfortably? No limit   How long can you walk comfortably? has to walk slow    Currently in Pain? Yes   Pain Score 4    Pain Location Hip   Pain Orientation Right   Pain Descriptors / Indicators Aching   Pain Type Acute pain   Pain Onset More than a month ago   Pain Frequency Constant   Aggravating  Factors  Going up and down stairs    Pain Relieving Factors rest; advil;    Effect of Pain on Daily Activities Pain when goin up and down stairs             [facility] PT Assessment - 07/21/17 0001      Assessment   Medical Diagnosis Right hip pain    Referring Provider Dr. Reino Bellis    Onset Date/Surgical Date --  2-3 weeks prior   Hand Dominance Right   Next MD Visit about 4 weeks    Prior Therapy None      Precautions   Precautions None     Restrictions   Weight Bearing Restrictions No     Balance Screen   Has the patient fallen in the past 6 months No   Has the patient had a decrease in activity level because of a fear of falling?  No   Is the patient reluctant to leave their home because of a fear of falling?  No     Home Environment   Additional Comments 3 sets of stairs in her house.      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  Cognition   Overall Cognitive Status Within Functional Limits for tasks assessed   Attention Focused   Focused Attention Appears intact   Memory Appears intact   Awareness Appears intact     Observation/Other Assessments   Focus on Therapeutic Outcomes (FOTO)  Not given by the front desk      Sensation   Light Touch Appears Intact     Coordination   Gross Motor Movements are Fluid and Coordinated Yes   Fine Motor Movements are Fluid and Coordinated Yes     ROM / Strength   AROM / PROM / Strength AROM;PROM;Strength     AROM   Overall AROM Comments noraml lumbar motion      PROM   Overall PROM Comments minor pain at end range flexion      Strength   Overall Strength Comments left lower extrenmity 5/5    Strength Assessment Site Hip;Knee   Right/Left Hip Right   Right Hip Flexion 3+/5   Right Hip ABduction 3+/5   Right/Left Knee Right;Left   Right Knee Flexion 4+/5   Right Knee Extension 4/5   Left Knee Flexion 5/5   Left Knee Extension 5/5      Flexibility   Soft Tissue Assessment /Muscle Length yes   Hamstrings 90/90 20 degrees bilateral    Piriformis decreased pain with prrifromis stretching      Palpation   Palpation comment Tenderness to plapation around her greater trochanter and iup into the glut/ glut medius area. No tenderness to palpation in the lumbar spine      Special Tests    Special Tests Hip Special Tests   Hip Special Tests  Hip Scouring     Hip Scouring   Comments (-) bilateral      Ambulation/Gait   Gait Comments right foot pronation with gait.             Objective measurements completed on examination: See above findings.          [facility] Adult PT Treatment/Exercise - 07/21/17 0001      Lumbar Exercises: Stretches   Active Hamstring Stretch Limitations 90/90 2x30 sec hold    Single Knee to Chest Stretch Limitations 2x30sec hold    Piriformis Stretch Limitations 2x30 sec hold      Knee/Hip Exercises: Standing   Other Standing Knee Exercises Hip extension 2x10      Knee/Hip Exercises: Supine   Bridges Limitations x10   Other Supine Knee/Hip Exercises clamshell 2x10                PT Education - 07/21/17 1532    Education provided Yes   Education Details reviewed HEP; Reviewed the anatomy of bursisits and overuse of gluteals for walking    Person(s) Educated Patient   Methods Explanation;Demonstration;Tactile cues;Verbal cues;Handout   Comprehension Verbalized understanding;Returned demonstration;Verbal cues required;Tactile cues required          PT Short Term Goals - 07/21/17 1543      PT SHORT TERM GOAL #1   Title Patient will increase gross right hip strength to 4+/5    Time 4   Period Weeks   Status New     PT SHORT TERM GOAL #2   Title Patient will dmesotrate a 15 second single leg stance time without pain    Time 4   Period Weeks   Status New     PT SHORT TERM GOAL #3   Title Patient be independent with initial HEP  Time 4   Period Weeks   Status  New           PT Long Term Goals - 07/21/17 1544      PT LONG TERM GOAL #1   Title Patient will demsotrate 5/5 gross bilateral hip strength in order to perfrom ADL's without pain    Time 8   Period Weeks   Status New   Target Date 08/18/17     PT LONG TERM GOAL #2   Title Patient will go up/down 8 steps without pain in order to ambaulte in her house.    Time 8   Period Weeks   Status New   Target Date 09/15/17     PT LONG TERM GOAL #3   Title Patient will be idependent with workout program to proomote hip strength and stability.    Time 8   Period Weeks   Status New   Target Date 09/15/17                Plan - 07/21/17 1535    Clinical Impression Statement Patient is a 76 year old female who presents with right sided hip pain with pain into her anterior thigh at times. Signs and symptoms are consistent with trochanteric bursitis. She has soreness in her gluteals. She has increased pain when going up and down stairs. She has weakness with hip extnesion and hip abduction. She would benefit from further skilled therapy to improve her strength and decrease her pain in her hip. She was given and HEP. She will work on her HEP and come in 1x a week for updates, modaliteis and manual therapy if needed.    Clinical Presentation Evolving   Clinical Presentation due to: fluctuationg pain with activity    Clinical Decision Making Low   Rehab Potential Good   Clinical Impairments Affecting Rehab Potential high arches    PT Frequency 2x / week   PT Duration 8 weeks   PT Treatment/Interventions ADLs/Self Care Home Management;Cryotherapy;Electrical Stimulation;Iontophoresis 4mg /ml Dexamethasone;Stair training;Gait training;Traction;Ultrasound;Therapeutic activities;Therapeutic exercise;Patient/family education;Passive range of motion;Manual techniques;Splinting;Taping;Dry needling;Neuromuscular re-education   PT Next Visit Plan assess tolerance to exercises; add single leg stance;  standing hip 3 way; yellow lateral band walk; review stretches; SLR; add thomas stretch and lateral trunk rotation    PT Home Exercise Plan pirifromis stretch; single knee to chest; hamstring stretch; supine clamshell; bridge, standing hip extension    Consulted and Agree with Plan of Care Patient      Patient will benefit from skilled therapeutic intervention in order to improve the following deficits and impairments:  Abnormal gait, Pain, Decreased strength, Decreased endurance, Decreased activity tolerance  Visit Diagnosis: Pain in right hip - Plan: PT plan of care cert/re-cert  Muscle weakness (generalized) - Plan: PT plan of care cert/re-cert  Difficulty in walking, not elsewhere classified - Plan: PT plan of care cert/re-cert      G-Codes - 07/21/17 1557    Functional Assessment Tool Used (Outpatient Only) clinical decision making    Functional Limitation Mobility: Walking and moving around   Mobility: Walking and Moving Around Current Status ([id number]) At least 1 percent but less than 20 percent impaired, limited or restricted   Mobility: Walking and Moving Around Goal Status (U0454(G8979) At least 1 percent but less than 20 percent impaired, limited or restricted       Problem List Patient Active Problem List   Diagnosis Date Noted  . Right hip pain 07/14/2017  . Chest pain 06/11/2015  .  Hypothyroidism 06/11/2015    Dessie Coma PT DPT  07/21/2017, 4:22 PM  [facility] [address] [place], [place], [address] Phone: [phone]   Fax:  [phone]  Name: Brenda Ortiz MRN: [id number] Date of Birth: 30-Apr-1941

## 2017-07-28 ENCOUNTER — Ambulatory Visit: Payer: Medicare Other | Admitting: Physical Therapy

## 2017-07-28 DIAGNOSIS — R262 Difficulty in walking, not elsewhere classified: Secondary | ICD-10-CM

## 2017-07-28 DIAGNOSIS — M6281 Muscle weakness (generalized): Secondary | ICD-10-CM

## 2017-07-28 DIAGNOSIS — M25551 Pain in right hip: Secondary | ICD-10-CM

## 2017-07-29 ENCOUNTER — Encounter: Payer: Self-pay | Admitting: Physical Therapy

## 2017-07-29 NOTE — Therapy (Signed)
[facility] Outpatient Rehabilitation [facility] [address] [place], [place], [address] Phone: [phone]   Fax:  [phone]  Physical Therapy Treatment  Patient Details  Name: Brenda Ortiz MRN: [id number] Date of Birth: [birth year] Referring Provider: Dr. Reino Bellis   Encounter Date: 07/28/2017      PT End of Session - 07/29/17 1453    Visit Number 2   Number of Visits 8   Date for PT Re-Evaluation 09/15/17   PT Start Time 1500   PT Stop Time 1544   PT Time Calculation (min) 44 min   Activity Tolerance Patient tolerated treatment well   Behavior During Therapy [facility] for tasks assessed/performed      Past Medical History:  Diagnosis Date  . Hypothyroidism   . Incontinence   . Thyroid disease     Past Surgical History:  Procedure Laterality Date  . DILATION AND CURETTAGE OF UTERUS    . FOOT SURGERY Right   . HYSTEROSCOPY W/D&C N/A 08/14/2016   Procedure: DILATATION AND CURETTAGE /HYSTEROSCOPY;  Surgeon: Olivia Mackie, MD;  Location: [facility];  Service: Gynecology;  Laterality: N/A;  . TONSILLECTOMY      There were no vitals filed for this visit.      Subjective Assessment - 07/29/17 1216    Subjective Patient reports her hip is better but still sore. She feels more sore in the morning. She has been working on her stretching and strengthening at home.    Pertinent History Has had bursitis in the past    Limitations Sitting;Standing;House hold activities   How long can you sit comfortably? If she sits for too long she has some pain when standing    How long can you stand comfortably? No limit   How long can you walk comfortably? has to walk slow    Currently in Pain? Yes   Pain Score 2    Pain Location Hip   Pain Orientation Right   Pain Descriptors / Indicators Aching   Pain Type Acute pain   Pain Onset More than a month ago   Pain Frequency Constant   Aggravating Factors  going down the stairs    Pain Relieving Factors rest and  advil    Effect of Pain on Daily Activities pain when going down the stairs                          [facility] Adult PT Treatment/Exercise - 07/29/17 0001      Lumbar Exercises: Stretches   Active Hamstring Stretch Limitations 90/90 2x30 sec hold    Single Knee to Chest Stretch Limitations 2x30sec hold    Lower Trunk Rotation Limitations x10    Piriformis Stretch Limitations 2x30 sec hold      Knee/Hip Exercises: Supine   Bridges Limitations x10    Other Supine Knee/Hip Exercises clamshell 2x10     Manual Therapy   Manual Therapy Soft tissue mobilization;Manual Traction   Soft tissue mobilization IASTYM to glutes; IT band toll out inc sidelying.    Manual Traction LAD to left hip           Trigger Point Dry Needling - 07/29/17 1451    Consent Given? Yes   Education Handout Provided Yes   Muscles Treated Lower Body Piriformis   Piriformis Response Twitch response elicited              PT Education - 07/29/17 1451    Education provided Yes  Education Details reviewed HEP; reviewed rationale behind TPDN; Reviewed benefits and risk of TPDN;     Person(s) Educated Patient   Methods Explanation;Demonstration;Tactile cues;Verbal cues;Handout   Comprehension Verbalized understanding;Returned demonstration;Verbal cues required;Tactile cues required          PT Short Term Goals - 07/29/17 1457      PT SHORT TERM GOAL #1   Title Patient will increase gross right hip strength to 4+/5    Time 4   Period Weeks   Status On-going     PT SHORT TERM GOAL #2   Title Patient will dmesotrate a 15 second single leg stance time without pain    Time 4   Period Weeks   Status On-going     PT SHORT TERM GOAL #3   Title Patient be independent with initial HEP    Time 4   Period Weeks   Status On-going           PT Long Term Goals - 07/21/17 1544      PT LONG TERM GOAL #1   Title Patient will demsotrate 5/5 gross bilateral hip strength in order to  perfrom ADL's without pain    Time 8   Period Weeks   Status New   Target Date 08/18/17     PT LONG TERM GOAL #2   Title Patient will go up/down 8 steps without pain in order to ambaulte in her house.    Time 8   Period Weeks   Status New   Target Date 09/15/17     PT LONG TERM GOAL #3   Title Patient will be idependent with workout program to proomote hip strength and stability.    Time 8   Period Weeks   Status New   Target Date 09/15/17               Plan - 07/29/17 1453    Clinical Impression Statement Patient tolerated dry needling well. She had a good twitch repsose of her pirfiromis. She reported improved tightness after treatment. Shetolerated exercises well. She was advised to continue with exrcises at home.    Clinical Presentation Evolving   Clinical Decision Making Low   Rehab Potential Good   Clinical Impairments Affecting Rehab Potential high arches    PT Frequency 2x / week   PT Duration 8 weeks   PT Treatment/Interventions ADLs/Self Care Home Management;Cryotherapy;Electrical Stimulation;Iontophoresis /ml Dexamethasone;Stair training;Gait training;Traction;Ultrasound;Therapeutic activities;Therapeutic exercise;Patient/family education;Passive range of motion;Manual techniques;Splinting;Taping;Dry needling;Neuromuscular re-education   PT Next Visit Plan assess tolerance to exercises; add single leg stance; standing hip 3 way; yellow lateral band walk; review stretches; SLR; add thomas stretch and lateral trunk rotation    PT Home Exercise Plan pirifromis stretch; single knee to chest; hamstring stretch; supine clamshell; bridge, standing hip extension    Consulted and Agree with Plan of Care Patient      Patient will benefit from skilled therapeutic intervention in order to improve the following deficits and impairments:  Abnormal gait, Pain, Decreased strength, Decreased endurance, Decreased activity tolerance  Visit Diagnosis: Pain in right  hip  Muscle weakness (generalized)  Difficulty in walking, not elsewhere classified     Problem List Patient Active Problem List   Diagnosis Date Noted  . Right hip pain 07/14/2017  . Chest pain 06/11/2015  . Hypothyroidism 06/11/2015    Dessie Coma PT DPT  07/29/2017, 2:59 PM  [facility] [address] [place], [place], [address] Phone: [phone]  Fax:  [phone]  Name: Brenda Ortiz MRN: [id number] Date of Birth: 08/17/1941

## 2017-08-05 ENCOUNTER — Ambulatory Visit: Payer: Medicare Other | Admitting: Physical Therapy

## 2017-08-05 DIAGNOSIS — R262 Difficulty in walking, not elsewhere classified: Secondary | ICD-10-CM

## 2017-08-05 DIAGNOSIS — M25551 Pain in right hip: Secondary | ICD-10-CM

## 2017-08-05 DIAGNOSIS — M6281 Muscle weakness (generalized): Secondary | ICD-10-CM

## 2017-08-06 NOTE — Therapy (Signed)
[facility] [address] [place], [place], [address] Phone: [phone]   Fax:  [phone]  Physical Therapy Treatment  Patient Details  Name: Brenda Ortiz MRN: [id number] Date of Birth: 05/02/1941 Referring Provider: Dr. Reino Bellis   Encounter Date: 08/05/2017      PT End of Session - 08/06/17 0952    Visit Number 3   Number of Visits 8   Date for PT Re-Evaluation 09/15/17   PT Start Time 1018   PT Stop Time 1102   PT Time Calculation (min) 44 min   Activity Tolerance Patient tolerated treatment well   Behavior During Therapy [facility] for tasks assessed/performed      Past Medical History:  Diagnosis Date  . Hypothyroidism   . Incontinence   . Thyroid disease     Past Surgical History:  Procedure Laterality Date  . DILATION AND CURETTAGE OF UTERUS    . FOOT SURGERY Right   . HYSTEROSCOPY W/D&C N/A 08/14/2016   Procedure: DILATATION AND CURETTAGE /HYSTEROSCOPY;  Surgeon: Olivia Mackie, MD;  Location: [facility];  Service: Gynecology;  Laterality: N/A;  . TONSILLECTOMY      There were no vitals filed for this visit.      Subjective Assessment - 08/06/17 0949    Subjective Patient reports she can still feel her hip but it is much better. She was very sore for 2 days after the needling but after it was much better. She will be going to [place] for 2 weeks. Therapy will review stretching for her trip.    Patient is accompained by: Interpreter   Pertinent History Has had bursitis in the past    Limitations Sitting;Standing;House hold activities   How long can you sit comfortably? If she sits for too long she has some pain when standing    How long can you stand comfortably? No limit   How long can you walk comfortably? has to walk slow    Currently in Pain? Yes   Pain Score 1    Pain Location Hip   Pain Orientation Right   Pain Descriptors / Indicators Aching   Pain Type Acute pain   Pain Onset More than a  month ago   Pain Frequency Constant   Aggravating Factors  going down the stairs    Pain Relieving Factors rest and advil    Effect of Pain on Daily Activities pain when going down the stairs                          [facility] Adult PT Treatment/Exercise - 08/06/17 0001      Lumbar Exercises: Stretches   Active Hamstring Stretch Limitations 90/90 2x30 sec hold    Single Knee to Chest Stretch Limitations 2x30sec hold    Lower Trunk Rotation Limitations x10    Piriformis Stretch Limitations 2x30 sec hold; supine hip ER 2x20sec hold      Knee/Hip Exercises: Standing   Other Standing Knee Exercises supine clamshell      Manual Therapy   Manual Therapy Soft tissue mobilization;Manual Traction   Soft tissue mobilization IASTYM to glutes; IT band toll out inc sidelying.    Manual Traction LAD to left hip           Trigger Point Dry Needling - 08/06/17 0956    Consent Given? Yes   Education Handout Provided No   Muscles Treated Lower Body Piriformis   Piriformis Response Twitch response elicited  PT Education - 08/06/17 [phone]    Education provided Yes   Education Details rebviewed strethcing for her trip    Person(s) Educated Patient   Methods Explanation;Demonstration;Tactile cues;Verbal cues;Handout   Comprehension Verbalized understanding;Returned demonstration;Verbal cues required;Tactile cues required          PT Short Term Goals - 07/29/17 1457      PT SHORT TERM GOAL #1   Title Patient will increase gross right hip strength to 4+/5    Time 4   Period Weeks   Status On-going     PT SHORT TERM GOAL #2   Title Patient will dmesotrate a 15 second single leg stance time without pain    Time 4   Period Weeks   Status On-going     PT SHORT TERM GOAL #3   Title Patient be independent with initial HEP    Time 4   Period Weeks   Status On-going           PT Long Term Goals - 07/21/17 1544      PT LONG TERM GOAL #1   Title  Patient will demsotrate 5/5 gross bilateral hip strength in order to perfrom ADL's without pain    Time 8   Period Weeks   Status New   Target Date 08/18/17     PT LONG TERM GOAL #2   Title Patient will go up/down 8 steps without pain in order to ambaulte in her house.    Time 8   Period Weeks   Status New   Target Date 09/15/17     PT LONG TERM GOAL #3   Title Patient will be idependent with workout program to proomote hip strength and stability.    Time 8   Period Weeks   Status New   Target Date 09/15/17               Plan - 08/06/17 0953    Clinical Impression Statement Patient tolerated needling well. Therapy needled 2 spots in her piriformis muscle and got a great twitch response. Patient tolerated stretching well. When patient returns therapy will begin with more strengthening exercies.    Clinical Presentation Evolving   Clinical Presentation due to: fluctuating pain and activity    Clinical Decision Making Low   Rehab Potential Good   Clinical Impairments Affecting Rehab Potential high arches    PT Frequency 2x / week   PT Duration 8 weeks   PT Treatment/Interventions ADLs/Self Care Home Management;Cryotherapy;Electrical Stimulation;Iontophoresis /ml Dexamethasone;Stair training;Gait training;Traction;Ultrasound;Therapeutic activities;Therapeutic exercise;Patient/family education;Passive range of motion;Manual techniques;Splinting;Taping;Dry needling;Neuromuscular re-education   PT Next Visit Plan assess tolerance to exercises; add single leg stance; standing hip 3 way; yellow lateral band walk; review stretches; SLR; add thomas stretch and lateral trunk rotation    PT Home Exercise Plan pirifromis stretch; single knee to chest; hamstring stretch; supine clamshell; bridge, standing hip extension    Consulted and Agree with Plan of Care Patient      Patient will benefit from skilled therapeutic intervention in order to improve the following deficits and  impairments:  Abnormal gait, Pain, Decreased strength, Decreased endurance, Decreased activity tolerance  Visit Diagnosis: Pain in right hip  Muscle weakness (generalized)  Difficulty in walking, not elsewhere classified     Problem List Patient Active Problem List   Diagnosis Date Noted  . Right hip pain 07/14/2017  . Chest pain 06/11/2015  . Hypothyroidism 06/11/2015    Dessie Coma PT DPT  08/06/2017, 9:58 AM  [facility] [address] [place], [place], [address] Phone: [phone]   Fax:  [phone]  Name: DANIJAH NOH MRN: [id number] Date of Birth: 09/05/1941

## 2017-08-24 ENCOUNTER — Ambulatory Visit: Payer: Medicare Other | Attending: Sports Medicine | Admitting: Physical Therapy

## 2017-08-24 ENCOUNTER — Encounter: Payer: Self-pay | Admitting: Physical Therapy

## 2017-08-24 DIAGNOSIS — R262 Difficulty in walking, not elsewhere classified: Secondary | ICD-10-CM | POA: Diagnosis present

## 2017-08-24 DIAGNOSIS — M6281 Muscle weakness (generalized): Secondary | ICD-10-CM | POA: Insufficient documentation

## 2017-08-24 DIAGNOSIS — M25551 Pain in right hip: Secondary | ICD-10-CM | POA: Diagnosis present

## 2017-08-24 NOTE — Therapy (Signed)
[facility] [address] [place], [place], [address] Phone: [phone]   Fax:  [phone]  Physical Therapy Treatment  Patient Details  Name: Brenda Ortiz MRN: [id number] Date of Birth: Dec 20, 1940 Referring Provider: Dr. Reino Bellis   Encounter Date: 08/24/2017      PT End of Session - 08/24/17 2154    Visit Number 4   Number of Visits 8   Date for PT Re-Evaluation 09/15/17   PT Start Time 1330   PT Stop Time 1415   PT Time Calculation (min) 45 min   Activity Tolerance Patient tolerated treatment well   Behavior During Therapy [facility] for tasks assessed/performed      Past Medical History:  Diagnosis Date  . Hypothyroidism   . Incontinence   . Thyroid disease     Past Surgical History:  Procedure Laterality Date  . DILATION AND CURETTAGE OF UTERUS    . FOOT SURGERY Right   . HYSTEROSCOPY W/D&C N/A 08/14/2016   Procedure: DILATATION AND CURETTAGE /HYSTEROSCOPY;  Surgeon: Olivia Mackie, MD;  Location: [facility];  Service: Gynecology;  Laterality: N/A;  . TONSILLECTOMY      There were no vitals filed for this visit.      Subjective Assessment - 08/24/17 1341    Subjective Patient just returned from [place] for 2 weeks. She did a large amount of driving and walking. Her left hip is now hurting her worse then her right. Her right hip has improved. she continues to have pain with stairs.    Currently in Pain? Yes   Pain Score 5    Pain Location Hip   Pain Orientation Right   Pain Descriptors / Indicators Aching   Pain Type Acute pain   Pain Onset More than a month ago   Pain Frequency Constant   Aggravating Factors  going down the stairs    Pain Relieving Factors rest and advil    Effect of Pain on Daily Activities pain going down stairs    Multiple Pain Sites Yes                         [facility] Adult PT Treatment/Exercise - 08/24/17 0001      Lumbar Exercises: Stretches   Active  Hamstring Stretch Limitations 90/90 2x30 sec hold    Single Knee to Chest Stretch Limitations 2x30sec hold    Lower Trunk Rotation Limitations x10    Piriformis Stretch Limitations 2x30 sec hold; supine hip ER 2x20sec hold      Knee/Hip Exercises: Supine   Bridges Limitations 2x10    Straight Leg Raises Limitations 2x7 bilateral    Other Supine Knee/Hip Exercises clamshell 2x10     Manual Therapy   Manual Therapy Soft tissue mobilization;Manual Traction   Soft tissue mobilization IASTYM to glutes; IT band roll out in sidelying bilateral .    Manual Traction LAD to left hip                 PT Education - 08/24/17 2151    Education provided Yes   Education Details added strengthening exercises    Person(s) Educated Patient   Methods Explanation;Demonstration;Tactile cues;Verbal cues   Comprehension Verbalized understanding;Returned demonstration;Verbal cues required;Tactile cues required          PT Short Term Goals - 07/29/17 1457      PT SHORT TERM GOAL #1   Title Patient will increase gross right hip strength to 4+/5  Time 4   Period Weeks   Status On-going     PT SHORT TERM GOAL #2   Title Patient will dmesotrate a 15 second single leg stance time without pain    Time 4   Period Weeks   Status On-going     PT SHORT TERM GOAL #3   Title Patient be independent with initial HEP    Time 4   Period Weeks   Status On-going           PT Long Term Goals - 07/21/17 1544      PT LONG TERM GOAL #1   Title Patient will demsotrate 5/5 gross bilateral hip strength in order to perfrom ADL's without pain    Time 8   Period Weeks   Status New   Target Date 08/18/17     PT LONG TERM GOAL #2   Title Patient will go up/down 8 steps without pain in order to ambaulte in her house.    Time 8   Period Weeks   Status New   Target Date 09/15/17     PT LONG TERM GOAL #3   Title Patient will be idependent with workout program to proomote hip strength and  stability.    Time 8   Period Weeks   Status New   Target Date 09/15/17               Plan - 08/24/17 2157    Clinical Impression Statement Good tolerance to treatment. Therapy added strengthening exercises. no increased pain on the left. Therapy rolled the left today as well. Patient will see Dr Margaretha Sheffield this week.  If needed she will get perscription so therapy can work on hr left as well.    Clinical Presentation Evolving   Clinical Presentation due to: fluctuating pain with activity    Clinical Decision Making Low   Rehab Potential Good   Clinical Impairments Affecting Rehab Potential high arches    PT Frequency 2x / week   PT Duration 8 weeks   PT Treatment/Interventions ADLs/Self Care Home Management;Cryotherapy;Electrical Stimulation;Iontophoresis /ml Dexamethasone;Stair training;Gait training;Traction;Ultrasound;Therapeutic activities;Therapeutic exercise;Patient/family education;Passive range of motion;Manual techniques;Splinting;Taping;Dry needling;Neuromuscular re-education   PT Next Visit Plan assess tolerance to exercises; add single leg stance; standing hip 3 way; yellow lateral band walk; review stretches; SLR; add thomas stretch and lateral trunk rotation    PT Home Exercise Plan pirifromis stretch; single knee to chest; hamstring stretch; supine clamshell; bridge, standing hip extension    Consulted and Agree with Plan of Care Patient      Patient will benefit from skilled therapeutic intervention in order to improve the following deficits and impairments:  Abnormal gait, Pain, Decreased strength, Decreased endurance, Decreased activity tolerance  Visit Diagnosis: Pain in right hip  Muscle weakness (generalized)  Difficulty in walking, not elsewhere classified     Problem List Patient Active Problem List   Diagnosis Date Noted  . Right hip pain 07/14/2017  . Chest pain 06/11/2015  . Hypothyroidism 06/11/2015    Dessie Coma 08/24/2017, 10:02  PM  [facility] [address] [place], [place], [address] Phone: [phone]   Fax:  [phone]  Name: Brenda Ortiz MRN: [id number] Date of Birth: June 06, 1941

## 2017-09-01 ENCOUNTER — Ambulatory Visit (INDEPENDENT_AMBULATORY_CARE_PROVIDER_SITE_OTHER): Payer: Medicare Other | Admitting: Sports Medicine

## 2017-09-01 VITALS — BP 112/70 | Ht 66.0 in | Wt 140.0 lb

## 2017-09-01 DIAGNOSIS — M25552 Pain in left hip: Secondary | ICD-10-CM

## 2017-09-01 DIAGNOSIS — M25551 Pain in right hip: Secondary | ICD-10-CM

## 2017-09-01 NOTE — Progress Notes (Signed)
   Subjective:    Patient ID: Brenda Ortiz, female    DOB: [birth year], 76 y.o.   MRN: [id number]  HPI   Patient comes in today for follow-up on right hip pain. She was improving with physical therapy until she took a long trip to [place]. Since then, her right hip pain has worsened slightly she is also getting identical pain in the left hip. She continues to localize it to the lateral hip. Worse with activity such as getting up and down out of a chair. Pain will radiate at times into her right thigh. She denies groin pain. No numbness or tingling. She takes 200 mg of Advil as needed which does help. Heat also helps. X-rays of her hip are fairly unremarkable. No significant degenerative changes. She has had a total of 4 physical therapy visits.   Review of Systems    as above Objective:   Physical Exam  Well-developed, well-nourished. No acute distress  Examination of both hips show smooth painless hip range of motion bilaterally. Negative logroll bilaterally. She is tender to palpation at the insertion of the gluteus medius tendon bilaterally. Still has some hip weakness bilaterally. No real tenderness over the greater trochanteric bursa. Negative straight leg raise. Neurovascularly intact distally.      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 I want her to continue with physical therapy and I will put in an order to have the therapist treat both hips. She may continue with her 200 mg of ibuprofen as needed since this is helpful. Continue with heat as well. Follow-up with me again in 6 weeks. We did discuss the possibility of an IM Depo-Medrol injection if symptoms persist but she would like to wait on that for now. Call with questions or concerns prior to her follow-up visit.

## 2017-09-08 ENCOUNTER — Ambulatory Visit: Payer: Medicare Other | Admitting: Physical Therapy

## 2017-09-08 DIAGNOSIS — M25551 Pain in right hip: Secondary | ICD-10-CM

## 2017-09-08 DIAGNOSIS — R262 Difficulty in walking, not elsewhere classified: Secondary | ICD-10-CM

## 2017-09-08 DIAGNOSIS — M6281 Muscle weakness (generalized): Secondary | ICD-10-CM

## 2017-09-09 ENCOUNTER — Encounter: Payer: Self-pay | Admitting: Physical Therapy

## 2017-09-09 NOTE — Therapy (Signed)
[facility] [address] [place], [place], [id number] Phone: [phone]   Fax:  [phone]  Physical Therapy Treatment  Patient Details  Name: [name] MRN: [id number] Date of Birth: 1941-04-13 Referring Provider: Dr. Reino Bellisimothy Draper   Encounter Date: 09/08/2017      PT End of Session - 09/09/17 0758    Visit Number 5   Number of Visits 8   Date for PT Re-Evaluation 09/15/17   PT Start Time 1415   PT Stop Time 1458   PT Time Calculation (min) 43 min   Activity Tolerance Patient tolerated treatment well   [facility] for tasks assessed/performed      Past Medical History:  Diagnosis Date  . Hypothyroidism   . Incontinence   . Thyroid disease     Past Surgical History:  Procedure Laterality Date  . DILATION AND CURETTAGE OF UTERUS    . FOOT SURGERY Right   . HYSTEROSCOPY W/D&C N/A 08/14/2016   Procedure: DILATATION AND CURETTAGE /HYSTEROSCOPY;  Surgeon: Olivia Mackieichard Taavon, MD;  Location: [facility];  Service: Gynecology;  Laterality: N/A;  . TONSILLECTOMY      There were no vitals filed for this visit.      Subjective Assessment - 09/09/17 0748    Subjective Patient continues to have pain but she rpeorts it is not as bad on eighter side. She has revcieved a script to work on her left hip. She has been going to the gym.    Pertinent History Has had bursitis in the past    Limitations Sitting;Standing;House hold activities   How long can you sit comfortably? If she sits for too long she has some pain when standing    How long can you stand comfortably? No limit   How long can you walk comfortably? has to walk slow    Currently in Pain? Yes   Pain Score 4    Pain Location Hip   Pain Orientation Right   Pain Descriptors / Indicators Aching   Pain Type Acute pain   Pain Onset More than a month ago   Pain Frequency Constant   Aggravating Factors  going down stairs    Pain Relieving Factors  rest and advil   Effect of Pain on Daily Activities Pain going up and down stairs                          [facility] Adult PT Treatment/Exercise - 09/09/17 0001      Lumbar Exercises: Stretches   Active Hamstring Stretch Limitations 90/90 2x30 sec hold    Single Knee to Chest Stretch Limitations 2x30sec hold    Lower Trunk Rotation Limitations x10    Piriformis Stretch Limitations 2x30 sec hold; supine hip ER 2x20sec hold      Knee/Hip Exercises: Standing   Knee Flexion Limitations 2x10   Forward Step Up Limitations 2x10 with 2-3 second hold bilateral    SLS 5x20 sec hold      Knee/Hip Exercises: Supine   Bridges Limitations 2x10    Straight Leg Raises Limitations 2x10 bilateral    Other Supine Knee/Hip Exercises clamshell 2x10                PT Education - 09/09/17 0756    Education provided Yes   Education Details reviewed strengthening and exercises    Person(s) Educated Patient   Methods Explanation;Demonstration;Tactile cues;Verbal cues   Comprehension Verbalized understanding;Returned demonstration;Verbal cues  required;Tactile cues required          PT Short Term Goals - 09/09/17 0826      PT SHORT TERM GOAL #1   Title Patient will increase gross right hip strength to 4+/5    Baseline continues to work on strength    Time 4   Period Weeks   Status On-going     PT SHORT TERM GOAL #2   Title Patient will dmesotrate a 15 second single leg stance time without pain    Baseline given for HEP needs mod UE assist    Time 4   Period Weeks   Status On-going     PT SHORT TERM GOAL #3   Title Patient be independent with initial HEP    Time 4   Period Weeks   Status On-going           PT Long Term Goals - 07/21/17 1544      PT LONG TERM GOAL #1   Title Patient will demsotrate 5/5 gross bilateral hip strength in order to perfrom ADL's without pain    Time 8   Period Weeks   Status New   Target Date 08/18/17     PT LONG TERM GOAL #2    Title Patient will go up/down 8 steps without pain in order to ambaulte in her house.    Time 8   Period Weeks   Status New   Target Date 09/15/17     PT LONG TERM GOAL #3   Title Patient will be idependent with workout program to proomote hip strength and stability.    Time 8   Period Weeks   Status New   Target Date 09/15/17               Plan - 09/09/17 0759    Clinical Impression Statement Therapy added exercises for full HEP today. Therapy also added stretching. She had no increase in pain with treatment. Her left leg appears to be much weaker then the right, She was given graded step exercises to work on at the gym. Stairs continue to be her C/O.    Clinical Presentation Evolving   Clinical Presentation due to: fluctuating pain with activity    Rehab Potential Good   Clinical Impairments Affecting Rehab Potential high arches    PT Frequency 2x / week   PT Duration 8 weeks   PT Treatment/Interventions ADLs/Self Care Home Management;Cryotherapy;Electrical Stimulation;Iontophoresis 4mg /ml Dexamethasone;Stair training;Gait training;Traction;Ultrasound;Therapeutic activities;Therapeutic exercise;Patient/family education;Passive range of motion;Manual techniques;Splinting;Taping;Dry needling;Neuromuscular re-education   PT Next Visit Plan assess tolerance to exercises; add single leg stance; standing hip 3 way; yellow lateral band walk; review stretches; SLR; add thomas stretch and lateral trunk rotation    PT Home Exercise Plan pirifromis stretch; single knee to chest; hamstring stretch; supine clamshell; bridge, standing hip extension    Consulted and Agree with Plan of Care Patient      Patient will benefit from skilled therapeutic intervention in order to improve the following deficits and impairments:  Abnormal gait, Pain, Decreased strength, Decreased endurance, Decreased activity tolerance  Visit Diagnosis: Pain in right hip  Muscle weakness  (generalized)  Difficulty in walking, not elsewhere classified     Problem List Patient Active Problem List   Diagnosis Date Noted  . Right hip pain 07/14/2017  . Chest pain 06/11/2015  . Hypothyroidism 06/11/2015    Dessie Coma PT DPT  09/09/2017, 12:42 PM  [facility] [address]  [address] [place], [place], [address] Phone: [phone]   Fax:  [phone]  Name: Brenda Ortiz MRN: [id number] Date of Birth: April 26, 1941

## 2017-09-28 ENCOUNTER — Ambulatory Visit: Payer: Medicare Other | Attending: Sports Medicine | Admitting: Physical Therapy

## 2017-09-28 DIAGNOSIS — M25551 Pain in right hip: Secondary | ICD-10-CM

## 2017-09-28 DIAGNOSIS — R262 Difficulty in walking, not elsewhere classified: Secondary | ICD-10-CM

## 2017-09-28 DIAGNOSIS — M6281 Muscle weakness (generalized): Secondary | ICD-10-CM

## 2017-09-28 NOTE — Therapy (Signed)
[facility] [address] [place], [place], [address] Phone: [phone]   Fax:  [phone]  Physical Therapy Treatment  Patient Details  Name: Brenda Ortiz MRN: [id number] Date of Birth: 01-29-1941 Referring Provider: Dr. Reino Bellis    Encounter Date: 09/28/2017  PT End of Session - 09/28/17 1101    Visit Number  6    Number of Visits  8    Date for PT Re-Evaluation  10/26/17    PT Start Time  1025    PT Stop Time  1103    PT Time Calculation (min)  38 min    Activity Tolerance  Patient tolerated treatment well    Behavior During Therapy  [facility] for tasks assessed/performed       Past Medical History:  Diagnosis Date  . Hypothyroidism   . Incontinence   . Thyroid disease     Past Surgical History:  Procedure Laterality Date  . DILATION AND CURETTAGE OF UTERUS    . FOOT SURGERY Right   . TONSILLECTOMY      There were no vitals filed for this visit.  Subjective Assessment - 09/28/17 1428    Subjective  Patient reports intermittent pain. She has been walking more but has not been doing her exercises as much as she shoulder. Her left hip hurts worse but her right hip is hurting as well.  She had no pain today but she did have pain in hte left hip yesterday.     Patient is accompained by:  Interpreter    Pertinent History  Has had bursitis in the past     Limitations  Sitting;Standing;House hold activities    How long can you sit comfortably?  If she sits for too long she has some pain when standing     How long can you stand comfortably?  No limit    How long can you walk comfortably?  has to walk slow     Currently in Pain?  No/denies         [facility] PT Assessment - 09/28/17 0001      Strength   Right Hip Flexion  4/5    Right Hip ABduction  4/5    Right Knee Flexion  4+/5    Right Knee Extension  4+/5    Left Knee Flexion  5/5    Left Knee Extension  5/5      Palpation   Palpation comment   continued toenderness to palpation bilateral                   [facility] Adult PT Treatment/Exercise - 09/28/17 0001      Lumbar Exercises: Stretches   Active Hamstring Stretch Limitations  90/90 2x30 sec hold     Piriformis Stretch Limitations  2x30 sec hold; supine hip ER 2x20sec hold       Knee/Hip Exercises: Standing   Knee Flexion Limitations  2x10    Other Standing Knee Exercises  tandem stance 2x20sec hold each; narrow base of supprt x30 seconds eyes open x30 sec eyes closed.       Knee/Hip Exercises: Supine   Bridges Limitations  2x10     Straight Leg Raises Limitations  2x10 bilateral     Other Supine Knee/Hip Exercises  clamshell 2x10             PT Education - 09/28/17 1434    Education provided  Yes    Education Details  reviewed ther-ex and balance  exercises     Person(s) Educated  Patient    Methods  Explanation;Demonstration;Tactile cues    Comprehension  Verbalized understanding;Returned demonstration;Verbal cues required;Tactile cues required       PT Short Term Goals - 09/28/17 1443      PT SHORT TERM GOAL #1   Title  Patient will increase gross right hip strength to 4+/5     Baseline  continues to work on strength     Time  4    Period  Weeks    Status  On-going      PT SHORT TERM GOAL #2   Title  Patient will dmesotrate a 15 second single leg stance time without pain     Baseline  given for HEP needs mod UE assist     Time  4    Period  Weeks    Status  On-going      PT SHORT TERM GOAL #3   Title  Patient be independent with initial HEP     Time  4    Period  Weeks    Status  On-going        PT Long Term Goals - 09/28/17 1444      PT LONG TERM GOAL #1   Title  Patient will demsotrate 5/5 gross bilateral hip strength in order to perfrom ADL's without pain     Time  8    Period  Weeks    Status  On-going      PT LONG TERM GOAL #2   Title  Patient will go up/down 8 steps without pain in order to ambaulte in her house.      Time  8    Period  Weeks    Status  On-going      PT LONG TERM GOAL #3   Title  Patient will be idependent with workout program to proomote hip strength and stability.     Time  8    Period  Weeks    Status  On-going            Plan - 09/28/17 1033    Clinical Impression Statement  Reviewed exercises and exercise technique. Patient encouraged to continue her exercises at home over the the next week. Therapy will continue to focus on balance and strengthening for the next few weeks. She will have more time to work on her exercises consitently     Clinical Presentation  Evolving    Clinical Decision Making  Low    PT Treatment/Interventions  ADLs/Self Care Home Management;Cryotherapy;Electrical Stimulation;Iontophoresis 4mg /ml Dexamethasone;Stair training;Gait training;Traction;Ultrasound;Therapeutic activities;Therapeutic exercise;Patient/family education;Passive range of motion;Manual techniques;Splinting;Taping;Dry needling;Neuromuscular re-education    PT Next Visit Plan  assess tolerance to exercises; add single leg stance; standing hip 3 way; yellow lateral band walk; review stretches; SLR; add thomas stretch and lateral trunk rotation     PT Home Exercise Plan  pirifromis stretch; single knee to chest; hamstring stretch; supine clamshell; bridge, standing hip extension     Consulted and Agree with Plan of Care  Patient       Patient will benefit from skilled therapeutic intervention in order to improve the following deficits and impairments:  Abnormal gait, Pain, Decreased strength, Decreased endurance, Decreased activity tolerance  Visit Diagnosis: Pain in right hip - Plan: PT plan of care cert/re-cert  Muscle weakness (generalized) - Plan: PT plan of care cert/re-cert  Difficulty in walking, not elsewhere classified - Plan: PT plan of care cert/re-cert   G-Codes - 09/28/17  1445    Functional Assessment Tool Used (Outpatient Only)  clinical decision making     Functional  Limitation  Mobility: Walking and moving around    Mobility: Walking and Moving Around Current Status [phone](G8978)  At least 1 percent but less than 20 percent impaired, limited or restricted    Mobility: Walking and Moving Around Goal Status [id number]([id number])  At least 1 percent but less than 20 percent impaired, limited or restricted       Problem List Patient Active Problem List   Diagnosis Date Noted  . Right hip pain 07/14/2017  . Chest pain 06/11/2015  . Hypothyroidism 06/11/2015    Dessie Comaavid J Kemisha Bonnette PT DPT  09/28/2017, 2:49 PM  [facility] [address] [place], [place], [address] Phone: [phone]   Fax:  [phone]  Name: [name] MRN: [id number] Date of Birth: 08-Jun-1941

## 2017-10-12 ENCOUNTER — Ambulatory Visit: Payer: Medicare Other | Admitting: Physical Therapy

## 2017-10-12 ENCOUNTER — Encounter: Payer: Self-pay | Admitting: Physical Therapy

## 2017-10-12 DIAGNOSIS — M25551 Pain in right hip: Secondary | ICD-10-CM

## 2017-10-12 DIAGNOSIS — M6281 Muscle weakness (generalized): Secondary | ICD-10-CM

## 2017-10-12 DIAGNOSIS — R262 Difficulty in walking, not elsewhere classified: Secondary | ICD-10-CM

## 2017-10-12 NOTE — Therapy (Signed)
[facility] [address] [place], [place], [address] Phone: [phone]   Fax:  [phone]  Physical Therapy Treatment  Patient Details  Name: Epimenio FootMargaret B Lacap MRN: [id number] Date of Birth: Dec 22, 1940 Referring Provider: Dr. Reino Bellisimothy Draper    Encounter Date: 10/12/2017  PT End of Session - 10/12/17 1031    Visit Number  7    Number of Visits  8    Date for PT Re-Evaluation  10/26/17    PT Start Time  1018    PT Stop Time  1100    PT Time Calculation (min)  42 min    Activity Tolerance  Patient tolerated treatment well    Behavior During Therapy  [facility] for tasks assessed/performed       Past Medical History:  Diagnosis Date  . Hypothyroidism   . Incontinence   . Thyroid disease     Past Surgical History:  Procedure Laterality Date  . DILATION AND CURETTAGE OF UTERUS    . FOOT SURGERY Right   . HYSTEROSCOPY W/D&C N/A 08/14/2016   Procedure: DILATATION AND CURETTAGE /HYSTEROSCOPY;  Surgeon: Olivia Mackieichard Taavon, MD;  Location: [facility];  Service: Gynecology;  Laterality: N/A;  . TONSILLECTOMY      There were no vitals filed for this visit.  Subjective Assessment - 10/12/17 1027    Currently in Pain?  No/denies                      [facility] - 10/12/17 0001      High Level Balance   High Level Balance Comments  tandem stance 2x30 sec mod UE asstistance; Narrow base on air-ex EO/ EC 2x20 xsec each; step onto air-ex x10 each       Knee/Hip Exercises: Supine   Bridges Limitations  2x10 with green band     Straight Leg Raises Limitations  2x10 bilateral 1 lb     Other Supine Knee/Hip Exercises  clamshell 2x10 green              PT Education - 10/12/17 1030    Education provided  No    Person(s) Educated  Patient    Methods  Explanation    Comprehension  Verbalized understanding;Returned demonstration;Verbal cues required;Tactile cues required       PT Short Term  Goals - 10/12/17 1128      PT SHORT TERM GOAL #1   Title  Patient will increase gross right hip strength to 4+/5     Baseline  continues to work on strength     Time  4    Period  Weeks    Status  On-going      PT SHORT TERM GOAL #2   Title  Patient will dmesotrate a 15 second single leg stance time without pain     Baseline  continues to require mod A     Time  4    Period  Weeks    Status  On-going      PT SHORT TERM GOAL #3   Title  Patient be independent with initial HEP     Time  4    Period  Weeks    Status  On-going        PT Long Term Goals - 10/12/17 1129      PT LONG TERM GOAL #1   Title  Patient will demsotrate 5/5 gross bilateral hip strength in order to perfrom ADL's without pain  Time  8    Period  Weeks    Status  On-going      PT LONG TERM GOAL #2   Title  Patient will go up/down 8 steps without pain in order to ambaulte in her house.     Time  8    Period  Weeks    Status  On-going      PT LONG TERM GOAL #3   Title  Patient will be idependent with workout program to proomote hip strength and stability.     Time  8    Period  Weeks    Status  On-going            Plan - 10/12/17 1033    Clinical Impression Statement  Patient encouraged to continue eworking on her HEP at home. She has not been able to do much work 2nd to the holidays. She is having less pain. She had no pain with treatment. Therapy continues to focus on balance and strengthening exercises. She would benefit from further skilled therapy to continue to work on balance and strengthening.     Clinical Presentation  Evolving    Clinical Decision Making  Low    Rehab Potential  Good    Clinical Impairments Affecting Rehab Potential  high arches     PT Frequency  2x / week    PT Duration  8 weeks    PT Treatment/Interventions  ADLs/Self Care Home Management;Cryotherapy;Electrical Stimulation;Iontophoresis 4mg /ml Dexamethasone;Stair training;Gait  training;Traction;Ultrasound;Therapeutic activities;Therapeutic exercise;Patient/family education;Passive range of motion;Manual techniques;Splinting;Taping;Dry needling;Neuromuscular re-education    PT Next Visit Plan  assess tolerance to exercises; add single leg stance; standing hip 3 way; yellow lateral band walk; review stretches; SLR; add thomas stretch and lateral trunk rotation; consider Ionto     PT Home Exercise Plan  pirifromis stretch; single knee to chest; hamstring stretch; supine clamshell; bridge, standing hip extension     Consulted and Agree with Plan of Care  Patient       Patient will benefit from skilled therapeutic intervention in order to improve the following deficits and impairments:  Abnormal gait, Pain, Decreased strength, Decreased endurance, Decreased activity tolerance  Visit Diagnosis: Pain in right hip  Muscle weakness (generalized)  Difficulty in walking, not elsewhere classified     Problem List Patient Active Problem List   Diagnosis Date Noted  . Right hip pain 07/14/2017  . Chest pain 06/11/2015  . Hypothyroidism 06/11/2015    Dessie Comaavid J Genavie Boettger 10/12/2017, 11:33 AM  [facility] [address] [place], [place], [address] Phone: [phone]   Fax:  [phone]  Name: Epimenio FootMargaret B Rubis MRN: [id number] Date of Birth: [birth year]

## 2017-10-13 ENCOUNTER — Encounter: Payer: Self-pay | Admitting: Sports Medicine

## 2017-10-13 ENCOUNTER — Ambulatory Visit: Payer: Medicare Other | Admitting: Sports Medicine

## 2017-10-13 VITALS — BP 120/80 | Ht 66.5 in | Wt 144.0 lb

## 2017-10-13 DIAGNOSIS — M25551 Pain in right hip: Secondary | ICD-10-CM

## 2017-10-13 DIAGNOSIS — M25552 Pain in left hip: Secondary | ICD-10-CM | POA: Diagnosis not present

## 2017-10-14 NOTE — Progress Notes (Signed)
   Subjective:    Patient ID: Brenda Ortiz, female    DOB: [birth year], 76 y.o.   MRN: [id number]  HPI   Patient comes in today for follow-up on right hip pain. She continues to improve with physical therapy. She has very little discomfort. She is able to ambulate up and down stairs much easier now.   Review of Systems    as above Objective:   Physical Exam  Well-developed, well-nourished. No acute distress. Awake alert and oriented 3. Vital signs reviewed  Right hip: Smooth painless hip range of motion with a negative log roll. She has no tenderness to palpation today at the insertion of the gluteus medius tendon onto the femur. Negative Trendelenburg. Slight hip weakness but improved since her last office visit in October. Neurovascularly intact distally. Walking without a limp.      Assessment & Plan:   Much improved hip pain secondary to gluteus medius tendinopathy  Patient is doing very well. I think she would benefit from a few more sessions of physical therapy. We will put in a new order to continue with PT with instructions for the patient to be discharged to a home exercise program per the therapist's discretion. As long as the patient continues to improve she can follow-up with me as needed.

## 2017-10-19 ENCOUNTER — Encounter: Payer: Self-pay | Admitting: Physical Therapy

## 2017-10-19 ENCOUNTER — Ambulatory Visit: Payer: Medicare Other | Attending: Sports Medicine | Admitting: Physical Therapy

## 2017-10-19 DIAGNOSIS — M6281 Muscle weakness (generalized): Secondary | ICD-10-CM | POA: Diagnosis present

## 2017-10-19 DIAGNOSIS — M25551 Pain in right hip: Secondary | ICD-10-CM | POA: Diagnosis present

## 2017-10-19 DIAGNOSIS — R262 Difficulty in walking, not elsewhere classified: Secondary | ICD-10-CM

## 2017-10-20 ENCOUNTER — Encounter: Payer: Self-pay | Admitting: Physical Therapy

## 2017-10-20 NOTE — Therapy (Signed)
[facility] [address] [place], [place], [address] Phone: [phone]   Fax:  [phone]  Physical Therapy Treatment  Patient Details  Name: Brenda Ortiz MRN: [id number] Date of Birth: 02/27/1941 Referring Provider: Dr. Reino Bellisimothy Draper    Encounter Date: 10/19/2017  PT End of Session - 10/19/17 0825    Visit Number  8    Number of Visits  13    Date for PT Re-Evaluation  11/30/17    PT Start Time  0804    PT Stop Time  0845    PT Time Calculation (min)  41 min    Activity Tolerance  Patient tolerated treatment well    Behavior During Therapy  [facility] for tasks assessed/performed       Past Medical History:  Diagnosis Date  . Hypothyroidism   . Incontinence   . Thyroid disease     Past Surgical History:  Procedure Laterality Date  . DILATION AND CURETTAGE OF UTERUS    . FOOT SURGERY Right   . HYSTEROSCOPY W/D&C N/A 08/14/2016   Procedure: DILATATION AND CURETTAGE /HYSTEROSCOPY;  Surgeon: Olivia Mackieichard Taavon, MD;  Location: [facility];  Service: Gynecology;  Laterality: N/A;  . TONSILLECTOMY      There were no vitals filed for this visit.  Subjective Assessment - 10/19/17 0816    Subjective  Patient reports her hip had felt good until this morning. She went on a long ride on Saturday but other then that she can think of anything that she has done. Her pain today is on the left.     Pertinent History  Has had bursitis in the past     Limitations  Sitting;Standing;House hold activities    How long can you sit comfortably?  If she sits for too long she has some pain when standing     How long can you stand comfortably?  No limit    How long can you walk comfortably?  has to walk slow     Currently in Pain?  Yes    Pain Score  4     Pain Location  Hip    Pain Orientation  Right    Pain Descriptors / Indicators  Aching    Pain Type  Acute pain    Pain Onset  More than a month ago    Pain Frequency  Constant     Aggravating Factors   going down the stairs     Pain Relieving Factors  rest and advil     Effect of Pain on Daily Activities  pain going up and down the stairs                       [facility] - 10/20/17 0001      High Level Balance   High Level Balance Comments  tandem stance 2x30 sec mod UE asstistance; Narrow base on air-ex EO/ EC 2x20 xsec each; step onto air-ex x10 each       Lumbar Exercises: Stretches   Active Hamstring Stretch Limitations  90/90 2x30 sec hold     Single Knee to Chest Stretch Limitations  2x30sec hold     Piriformis Stretch Limitations  2x30 sec hold; supine hip ER 2x20sec hold       Knee/Hip Exercises: Standing   Knee Flexion Limitations  2x10    Forward Step Up Limitations  2x10 with 2-3 second hold bilateral     SLS  5x20  sec hold       Knee/Hip Exercises: Supine   Bridges Limitations  2x10 with green band     Straight Leg Raises Limitations  2x10 bilateral 1 lb     Other Supine Knee/Hip Exercises  clamshell 2x10 green              PT Education - 10/19/17 0824    Education provided  Yes    Education Details  reviewed HEP; IONTO use    Person(s) Educated  Patient    Methods  Explanation    Comprehension  Verbalized understanding;Returned demonstration;Verbal cues required;Tactile cues required       PT Short Term Goals - 10/12/17 1128      PT SHORT TERM GOAL #1   Title  Patient will increase gross right hip strength to 4+/5     Baseline  continues to work on strength     Time  4    Period  Weeks    Status  On-going      PT SHORT TERM GOAL #2   Title  Patient will dmesotrate a 15 second single leg stance time without pain     Baseline  continues to require mod A     Time  4    Period  Weeks    Status  On-going      PT SHORT TERM GOAL #3   Title  Patient be independent with initial HEP     Time  4    Period  Weeks    Status  On-going        PT Long Term Goals - 10/12/17 1129      PT  LONG TERM GOAL #1   Title  Patient will demsotrate 5/5 gross bilateral hip strength in order to perfrom ADL's without pain     Time  8    Period  Weeks    Status  On-going      PT LONG TERM GOAL #2   Title  Patient will go up/down 8 steps without pain in order to ambaulte in her house.     Time  8    Period  Weeks    Status  On-going      PT LONG TERM GOAL #3   Title  Patient will be idependent with workout program to proomote hip strength and stability.     Time  8    Period  Weeks    Status  On-going            Plan - 10/19/17 0835    Clinical Impression Statement  Therapy added Ionto and worked on manual therapy to reduce left hip inflammation. Her pain is focused right around her trochanter on the left. She had some instability with the air-ex activity which was more on the left.     Clinical Presentation  Evolving    Clinical Decision Making  Low    Rehab Potential  Good    PT Frequency  2x / week    PT Duration  8 weeks    PT Treatment/Interventions  ADLs/Self Care Home Management;Cryotherapy;Electrical Stimulation;Iontophoresis 4mg /ml Dexamethasone;Stair training;Gait training;Traction;Ultrasound;Therapeutic activities;Therapeutic exercise;Patient/family education;Passive range of motion;Manual techniques;Splinting;Taping;Dry needling;Neuromuscular re-education    PT Next Visit Plan  continue with balance and stability activity     PT Home Exercise Plan  pirifromis stretch; single knee to chest; hamstring stretch; supine clamshell; bridge, standing hip extension     Consulted and Agree with Plan of Care  Patient  Patient will benefit from skilled therapeutic intervention in order to improve the following deficits and impairments:  Abnormal gait, Pain, Decreased strength, Decreased endurance, Decreased activity tolerance  Visit Diagnosis: Pain in right hip  Muscle weakness (generalized)  Difficulty in walking, not elsewhere classified     Problem  List Patient Active Problem List   Diagnosis Date Noted  . Right hip pain 07/14/2017  . Chest pain 06/11/2015  . Hypothyroidism 06/11/2015    Dessie Coma  PT DPT  10/20/2017, 12:39 PM  [facility] [address] [place], [place], [address] Phone: [phone]   Fax:  [phone]  Name: Brenda Ortiz MRN: [id number] Date of Birth: Jul 13, 1941

## 2017-10-26 ENCOUNTER — Ambulatory Visit: Payer: Medicare Other | Admitting: Physical Therapy

## 2017-11-02 ENCOUNTER — Ambulatory Visit: Payer: Medicare Other | Admitting: Physical Therapy

## 2017-11-02 ENCOUNTER — Encounter: Payer: Self-pay | Admitting: Physical Therapy

## 2017-11-02 DIAGNOSIS — M6281 Muscle weakness (generalized): Secondary | ICD-10-CM

## 2017-11-02 DIAGNOSIS — M25551 Pain in right hip: Secondary | ICD-10-CM | POA: Diagnosis not present

## 2017-11-02 DIAGNOSIS — R262 Difficulty in walking, not elsewhere classified: Secondary | ICD-10-CM

## 2017-11-02 NOTE — Therapy (Signed)
[facility] [address] [place], [place], [address] Phone: [phone]   Fax:  [phone]  Physical Therapy Treatment  Patient Details  Name: Brenda Ortiz MRN: [id number] Date of Birth: 08/17/1941 Referring Provider: Dr. Reino Bellisimothy Draper    Encounter Date: 11/02/2017  PT End of Session - 11/02/17 1009    Visit Number  9    Number of Visits  13    Date for PT Re-Evaluation  11/30/17    PT Start Time  0935    PT Stop Time  1015    PT Time Calculation (min)  40 min    Activity Tolerance  Patient tolerated treatment well    Behavior During Therapy  [facility] assessed/[facility]       Past Medical History:  Diagnosis Date  . Hypothyroidism   . Incontinence   . Thyroid disease     Past Surgical History:  Procedure Laterality Date  . DILATION AND CURETTAGE OF UTERUS    . FOOT SURGERY Right   . HYSTEROSCOPY W/D&C N/A 08/14/2016   Procedure: DILATATION AND CURETTAGE /HYSTEROSCOPY;  Surgeon: Olivia Mackieichard Taavon, MD;  Location: [facility];  Service: Gynecology;  Laterality: N/A;  . TONSILLECTOMY      There were no vitals filed for this visit.  Subjective Assessment - 11/02/17 0959    Subjective  Patients pain level was aout a 6/10 today. She had very little pain yesterday.                       [facility] Adult PT Treatment/Exercise - 11/02/17 1242      High Level Balance   High Level Balance Comments  tandem stance 2x30 sec mod UE asstistance; Narrow base on air-ex EO/ EC 2x20 xsec each; step onto air-ex x10 each       Lumbar Exercises: Stretches   Active Hamstring Stretch Limitations  90/90 2x30 sec hold     Single Knee to Chest Stretch Limitations  2x30sec hold     Piriformis Stretch Limitations  2x30 sec hold; supine hip ER 2x20sec hold       Knee/Hip Exercises: Standing   Forward Step Up Limitations  2x10 with 2-3 second hold bilateral     SLS  5x20 sec hold       Knee/Hip Exercises: Supine   Bridges Limitations  2x10 with green band     Bridges with Clamshell  10 reps;2 sets    Straight Leg Raises Limitations  2x10 bilateral 1 lb     Other Supine Knee/Hip Exercises  clamshell 2x10 green              PT Education - 11/02/17 1238    Education provided  Yes    Education Details  reviewed HEP; importance of improving stability     Person(s) Educated  Patient    Methods  Explanation;Demonstration    Comprehension  Verbalized understanding;Returned demonstration;Verbal cues required;Tactile cues required       PT Short Term Goals - 10/12/17 1128      PT SHORT TERM GOAL #1   Title  Patient will increase gross right hip strength to 4+/5     Baseline  continues to work on strength     Time  4    Period  Weeks    Status  On-going      PT SHORT TERM GOAL #2   Title  Patient will dmesotrate a 15 second single leg stance time without pain  Baseline  continues to require mod A     Time  4    Period  Weeks    Status  On-going      PT SHORT TERM GOAL #3   Title  Patient be independent with initial HEP     Time  4    Period  Weeks    Status  On-going        PT Long Term Goals - 10/12/17 1129      PT LONG TERM GOAL #1   Title  Patient will demsotrate 5/5 gross bilateral hip strength in order to perfrom ADL's without pain     Time  8    Period  Weeks    Status  On-going      PT LONG TERM GOAL #2   Title  Patient will go up/down 8 steps without pain in order to ambaulte in her house.     Time  8    Period  Weeks    Status  On-going      PT LONG TERM GOAL #3   Title  Patient will be idependent with workout program to proomote hip strength and stability.     Time  8    Period  Weeks    Status  On-going            Plan - 11/02/17 1239    Clinical Impression Statement  Patient reported fatigue in her hip but no pain. She continues to work on her exercises. There dosent seem to be any pattern to her pain. Some days she has pain. Some days she has no  pain.     Clinical Presentation  Evolving    Clinical Decision Making  Low    Rehab Potential  Good    Clinical Impairments Affecting Rehab Potential  high arches     PT Frequency  2x / week    PT Duration  8 weeks    PT Treatment/Interventions  ADLs/Self Care Home Management;Cryotherapy;Electrical Stimulation;Iontophoresis 4mg /ml Dexamethasone;Stair training;Gait training;Traction;Ultrasound;Therapeutic activities;Therapeutic exercise;Patient/family education;Passive range of motion;Manual techniques;Splinting;Taping;Dry needling;Neuromuscular re-education    PT Next Visit Plan  continue with balance and stability activity     PT Home Exercise Plan  pirifromis stretch; single knee to chest; hamstring stretch; supine clamshell; bridge, standing hip extension     Consulted and Agree with Plan of Care  Patient       Patient will benefit from skilled therapeutic intervention in order to improve the following deficits and impairments:  Abnormal gait, Pain, Decreased strength, Decreased endurance, Decreased activity tolerance  Visit Diagnosis: Pain in right hip  Muscle weakness (generalized)  Difficulty in walking, not elsewhere classified     Problem List Patient Active Problem List   Diagnosis Date Noted  . Right hip pain 07/14/2017  . Chest pain 06/11/2015  . Hypothyroidism 06/11/2015    Dessie Comaavid J Elis Sauber  PT DPT  11/02/2017, 12:43 PM  [facility] [address] [place], [place], [address] Phone: [phone]   Fax:  [phone]  Name: Brenda Ortiz MRN: [id number] Date of Birth: December 23, 1940

## 2017-11-18 ENCOUNTER — Encounter: Payer: Self-pay | Admitting: Physical Therapy

## 2017-11-18 ENCOUNTER — Ambulatory Visit: Payer: Medicare Other | Attending: Sports Medicine | Admitting: Physical Therapy

## 2017-11-18 DIAGNOSIS — M6281 Muscle weakness (generalized): Secondary | ICD-10-CM | POA: Insufficient documentation

## 2017-11-18 DIAGNOSIS — R262 Difficulty in walking, not elsewhere classified: Secondary | ICD-10-CM | POA: Diagnosis present

## 2017-11-18 DIAGNOSIS — M25551 Pain in right hip: Secondary | ICD-10-CM | POA: Insufficient documentation

## 2017-11-18 NOTE — Therapy (Signed)
[facility] [address] [place], [place], [address] Phone: [phone]   Fax:  [phone]  Physical Therapy Treatment  Patient Details  Name: Brenda Ortiz MRN: [id number] Date of Birth: 1941/02/13 Referring Provider: Dr. Reino Bellisimothy Draper    Encounter Date: 11/18/2017  PT End of Session - 11/18/17 0934    Visit Number  10    Number of Visits  13    Date for PT Re-Evaluation  11/30/17    PT Start Time  0934    PT Stop Time  1014    PT Time Calculation (min)  40 min    Activity Tolerance  Patient tolerated treatment well    Behavior During Therapy  [facility] assessed/performed       Past Medical History:  Diagnosis Date  . Hypothyroidism   . Incontinence   . Thyroid disease     Past Surgical History:  Procedure Laterality Date  . DILATION AND CURETTAGE OF UTERUS    . Ortiz SURGERY Right   . HYSTEROSCOPY W/D&C N/A 08/14/2016   Procedure: DILATATION AND CURETTAGE /HYSTEROSCOPY;  Surgeon: Olivia Mackieichard Taavon, MD;  Location: [facility];  Service: Gynecology;  Laterality: N/A;  . TONSILLECTOMY      There were no vitals filed for this visit.  Subjective Assessment - 11/18/17 0935    Subjective  Pt reports obtaining a balance pad for home. Exercises almost every day at sport time. Stairs aggrivate Lt hip and it wakes her at night. Reports neighbor asked if she was limping.     Currently in Pain?  Yes    Pain Score  5  when moving or rolling on it at night       Pilates Reformer used for LE/core strength, postural strength, lumbopelvic disassociation and core control.  Exercises included: Footwork- 2R 1B Bridging- 2R1B ball bw knees Supine Arm work- 1R1B Feet in Straps- 1R1B  Figure 4 stretch                          PT Short Term Goals - 10/12/17 1128      PT SHORT TERM GOAL #1   Title  Patient will increase gross right hip strength to 4+/5     Baseline  continues to work on strength     Time  4    Period  Weeks    Status  On-going      PT SHORT TERM GOAL #2   Title  Patient will dmesotrate a 15 second single leg stance time without pain     Baseline  continues to require mod A     Time  4    Period  Weeks    Status  On-going      PT SHORT TERM GOAL #3   Title  Patient be independent with initial HEP     Time  4    Period  Weeks    Status  On-going        PT Long Term Goals - 10/12/17 1129      PT LONG TERM GOAL #1   Title  Patient will demsotrate 5/5 gross bilateral hip strength in order to perfrom ADL's without pain     Time  8    Period  Weeks    Status  On-going      PT LONG TERM GOAL #2   Title  Patient will go up/down 8 steps without pain in order to ambaulte  in her house.     Time  8    Period  Weeks    Status  On-going      PT LONG TERM GOAL #3   Title  Patient will be idependent with workout program to proomote hip strength and stability.     Time  8    Period  Weeks    Status  On-going            Plan - 11/18/17 1010    Clinical Impression Statement  Utilized reformer today for full body strength challenge, felt work without increase in pain. Notable limitation in Lt Ortiz DF v Rt.     PT Treatment/Interventions  ADLs/Self Care Home Management;Cryotherapy;Electrical Stimulation;Iontophoresis 4mg /ml Dexamethasone;Stair training;Gait training;Traction;Ultrasound;Therapeutic activities;Therapeutic exercise;Patient/family education;Passive range of motion;Manual techniques;Splinting;Taping;Dry needling;Neuromuscular re-education    PT Next Visit Plan  Lt ankle DF stretch, gross lumbopelvic strength & balance    PT Home Exercise Plan  pirifromis stretch; single knee to chest; hamstring stretch; supine clamshell; bridge, standing hip extension     Consulted and Agree with Plan of Care  Patient       Patient will benefit from skilled therapeutic intervention in order to improve the following deficits and impairments:  Abnormal gait, Pain,  Decreased strength, Decreased endurance, Decreased activity tolerance  Visit Diagnosis: Pain in right hip  Muscle weakness (generalized)  Difficulty in walking, not elsewhere classified     Problem List Patient Active Problem List   Diagnosis Date Noted  . Right hip pain 07/14/2017  . Chest pain 06/11/2015  . Hypothyroidism 06/11/2015    Peg Fifer C. Waldon Sheerin PT, DPT 11/18/17 1:07 PM   [facility] [address] [place], [place], [address] Phone: [phone]   Fax:  [phone]  Name: Brenda Ortiz MRN: [id number] Date of Birth: 1940/11/30

## 2017-11-24 ENCOUNTER — Encounter: Payer: Self-pay | Admitting: Physical Therapy

## 2017-11-24 ENCOUNTER — Ambulatory Visit: Payer: Medicare Other | Admitting: Physical Therapy

## 2017-11-24 DIAGNOSIS — M25551 Pain in right hip: Secondary | ICD-10-CM | POA: Diagnosis not present

## 2017-11-24 DIAGNOSIS — R262 Difficulty in walking, not elsewhere classified: Secondary | ICD-10-CM

## 2017-11-24 DIAGNOSIS — M6281 Muscle weakness (generalized): Secondary | ICD-10-CM

## 2017-11-24 NOTE — Therapy (Signed)
[facility] [address] [place], [place], [address] Phone: [phone]   Fax:  [phone]  Physical Therapy Treatment  Patient Details  Name: Brenda Ortiz MRN: [id number] Date of Birth: [birth year] Referring Provider: Dr. Reino Bellis    Encounter Date: 11/24/2017  PT End of Session - 11/24/17 2141    Visit Number  11    Number of Visits  13    Date for PT Re-Evaluation  11/30/17    PT Start Time  1425    PT Stop Time  1505    PT Time Calculation (min)  40 min    Activity Tolerance  Patient tolerated treatment well    Behavior During Therapy  [facility] for tasks assessed/performed       Past Medical History:  Diagnosis Date  . Hypothyroidism   . Incontinence   . Thyroid disease     Past Surgical History:  Procedure Laterality Date  . DILATION AND CURETTAGE OF UTERUS    . FOOT SURGERY Right   . HYSTEROSCOPY W/D&C N/A 08/14/2016   Procedure: DILATATION AND CURETTAGE /HYSTEROSCOPY;  Surgeon: Olivia Mackie, MD;  Location: [facility];  Service: Gynecology;  Laterality: N/A;  . TONSILLECTOMY      There were no vitals filed for this visit.  Subjective Assessment - 11/24/17 1431    Subjective  Patient reports it has not been bad but it is still there. She feels like it is getting better but the pain still comes and goes. She has some pain at night.     Patient is accompained by:  Interpreter    Pertinent History  Has had bursitis in the past     Limitations  Sitting;Standing;House hold activities    How long can you sit comfortably?  If she sits for too long she has some pain when standing     How long can you stand comfortably?  No limit    How long can you walk comfortably?  has to walk slow     Currently in Pain?  Yes    Pain Score  5     Pain Location  Hip    Pain Orientation  Right    Pain Descriptors / Indicators  Aching    Pain Type  Acute pain    Pain Onset  More than a month ago    Pain Frequency  Constant     Aggravating Factors   going down the stairs     Pain Relieving Factors  rest and advil     Effect of Pain on Daily Activities  pain going up and down                       [facility] Adult PT Treatment/Exercise - 11/24/17 0001      Lumbar Exercises: Stretches   Active Hamstring Stretch Limitations  90/90 2x30 sec hold     Single Knee to Chest Stretch Limitations  2x30sec hold     Piriformis Stretch Limitations  2x30 sec hold; supine hip ER 2x20sec hold       Knee/Hip Exercises: Supine   Other Supine Knee/Hip Exercises  Reformer: leg press 2x10; leg press with ball 2x10; leg circles 1 blue 2x10 each direction cuing to keep back flat and core tight.              PT Education - 11/24/17 2139    Education provided  Yes    Education Details  technique with  ther-ex     Person(s) Educated  Patient    Methods  Explanation;Demonstration;Tactile cues;Verbal cues    Comprehension  Verbalized understanding;Returned demonstration;Verbal cues required;Tactile cues required       PT Short Term Goals - 10/12/17 1128      PT SHORT TERM GOAL #1   Title  Patient will increase gross right hip strength to 4+/5     Baseline  continues to work on strength     Time  4    Period  Weeks    Status  On-going      PT SHORT TERM GOAL #2   Title  Patient will dmesotrate a 15 second single leg stance time without pain     Baseline  continues to require mod A     Time  4    Period  Weeks    Status  On-going      PT SHORT TERM GOAL #3   Title  Patient be independent with initial HEP     Time  4    Period  Weeks    Status  On-going        PT Long Term Goals - 10/12/17 1129      PT LONG TERM GOAL #1   Title  Patient will demsotrate 5/5 gross bilateral hip strength in order to perfrom ADL's without pain     Time  8    Period  Weeks    Status  On-going      PT LONG TERM GOAL #2   Title  Patient will go up/down 8 steps without pain in order to ambaulte in her house.     Time   8    Period  Weeks    Status  On-going      PT LONG TERM GOAL #3   Title  Patient will be idependent with workout program to proomote hip strength and stability.     Time  8    Period  Weeks    Status  On-going            Plan - 11/24/17 2148    Clinical Impression Statement  Patient  reported improved pain after the reformed. she required min cuing for technique. She was given inforamtion about the pilatis class. Therapy will re-certafy patient next visit with the potential of D/C     Clinical Presentation  Evolving    Clinical Decision Making  Low    Rehab Potential  Good    Clinical Impairments Affecting Rehab Potential  high arches     PT Frequency  2x / week    PT Duration  8 weeks    PT Treatment/Interventions  ADLs/Self Care Home Management;Cryotherapy;Electrical Stimulation;Iontophoresis 4mg /ml Dexamethasone;Stair training;Gait training;Traction;Ultrasound;Therapeutic activities;Therapeutic exercise;Patient/family education;Passive range of motion;Manual techniques;Splinting;Taping;Dry needling;Neuromuscular re-education    PT Next Visit Plan  Lt ankle DF stretch, gross lumbopelvic strength & balance    PT Home Exercise Plan  pirifromis stretch; single knee to chest; hamstring stretch; supine clamshell; bridge, standing hip extension     Consulted and Agree with Plan of Care  Patient       Patient will benefit from skilled therapeutic intervention in order to improve the following deficits and impairments:  Abnormal gait, Pain, Decreased strength, Decreased endurance, Decreased activity tolerance  Visit Diagnosis: Pain in right hip  Muscle weakness (generalized)  Difficulty in walking, not elsewhere classified     Problem List Patient Active Problem List   Diagnosis Date Noted  . Right hip  pain 07/14/2017  . Chest pain 06/11/2015  . Hypothyroidism 06/11/2015    Dessie Coma PT DPT  11/24/2017, 9:57 PM  [facility] [address] [place], [place], [address] Phone: [phone]   Fax:  [phone]  Name: Brenda Ortiz MRN: [id number] Date of Birth: [birth year]

## 2017-11-25 ENCOUNTER — Ambulatory Visit: Payer: Medicare Other | Admitting: Physical Therapy

## 2017-12-08 ENCOUNTER — Encounter: Payer: Self-pay | Admitting: Physical Therapy

## 2017-12-08 ENCOUNTER — Ambulatory Visit: Payer: Medicare Other | Admitting: Physical Therapy

## 2017-12-08 DIAGNOSIS — M25551 Pain in right hip: Secondary | ICD-10-CM | POA: Diagnosis not present

## 2017-12-08 DIAGNOSIS — M6281 Muscle weakness (generalized): Secondary | ICD-10-CM

## 2017-12-08 DIAGNOSIS — R262 Difficulty in walking, not elsewhere classified: Secondary | ICD-10-CM

## 2017-12-09 NOTE — Therapy (Signed)
[place], [place], [address] Phone: [phone]   Fax:  [phone]  Physical Therapy Treatment/ Discharge   Patient Details  Name: Brenda Ortiz MRN: [id number] Date of Birth: 1941/05/07 Referring Provider: Dr. Lilia Argue    Encounter Date: 12/08/2017  PT End of Session - 12/08/17 1057    Visit Number  12    Number of Visits  13    Date for PT Re-Evaluation  11/30/17    PT Start Time  1027 Patient was 17 minutes late     PT Stop Time  1058    PT Time Calculation (min)  31 min    Activity Tolerance  Patient tolerated treatment well    Behavior During Therapy  [facility] for tasks assessed/performed       Past Medical History:  Diagnosis Date  . Hypothyroidism   . Incontinence   . Thyroid disease     Past Surgical History:  Procedure Laterality Date  . DILATION AND CURETTAGE OF UTERUS    . FOOT SURGERY Right   . HYSTEROSCOPY W/D&C N/A 08/14/2016   Procedure: DILATATION AND CURETTAGE /HYSTEROSCOPY;  Surgeon: Brien Few, MD;  Location: [facility];  Service: Gynecology;  Laterality: N/A;  . TONSILLECTOMY      There were no vitals filed for this visit.  Subjective Assessment - 12/09/17 1016    Subjective  Patient feels like her hip has been better recently. She still has minor pain from time to time but overall it is better. She has been trying to do her exercises more consistently.     Patient is accompained by:  Interpreter    Pertinent History  Has had bursitis in the past     Limitations  Sitting;Standing;House hold activities    How long can you sit comfortably?  If she sits for too long she has some pain when standing     How long can you stand comfortably?  No limit    How long can you walk comfortably?  has to walk slow     Currently in Pain?  No/denies                      [facility] Adult PT Treatment/Exercise - 12/09/17 0001      Lumbar Exercises: Stretches   Active  Hamstring Stretch Limitations  90/90 2x30 sec hold     Single Knee to Chest Stretch Limitations  2x30sec hold     Piriformis Stretch Limitations  2x30 sec hold; supine hip ER 2x20sec hold       Knee/Hip Exercises: Supine   Bridges Limitations  2x10 with green band     Bridges with Clamshell  10 reps;2 sets    Straight Leg Raises Limitations  2x10 bilateral 1 lb     Other Supine Knee/Hip Exercises  clamshell 2x10 green              PT Education - 12/09/17 1020    Education provided  Yes    Education Details  HEP     Person(s) Educated  Patient    Methods  Explanation;Demonstration;Tactile cues    Comprehension  Verbalized understanding;Returned demonstration;Verbal cues required;Tactile cues required;Need further instruction       PT Short Term Goals - 12/09/17 1028      PT SHORT TERM GOAL #1   Title  Patient will increase gross right hip strength to 4+/5     Baseline  4+/5 hip flexion and  abduction blateral     Time  4    Period  Weeks    Status  Achieved      PT SHORT TERM GOAL #2   Title  Patient will dmesotrate a 15 second single leg stance time without pain     Baseline  20 secondse with only small UE correction     Time  4    Period  Weeks    Status  Achieved      PT SHORT TERM GOAL #3   Title  Patient be independent with initial HEP     Baseline  perfroming at home     Time  4    Period  Weeks        PT Long Term Goals - 12/09/17 1029      PT LONG TERM GOAL #1   Title  Patient will demsotrate 5/5 gross bilateral hip strength in order to perfrom ADL's without pain     Baseline  4+/5 but no pain with ADL's     Time  8    Period  Weeks    Status  Partially Met      PT LONG TERM GOAL #2   Title  Patient will go up/down 8 steps without pain in order to ambaulte in her house.     Baseline  perfroming stairs without much pain     Period  Weeks    Status  Achieved      PT LONG TERM GOAL #3   Title  Patient will be idependent with workout program to  proomote hip strength and stability.     Time  8    Period  Weeks    Status  Achieved            Plan - 12/08/17 1104    Clinical Impression Statement  Patient conntinues to have intermittent pain but she has a full program to work on at home. She was advised she needs to be more consistent with her exercises. She is going to a trainer and she is doing yoga as well. Therapy reviewed her complete HEP. She feels comfortable with HEP. See below for goal specific progress.     Clinical Presentation  Evolving    Clinical Decision Making  Low    Rehab Potential  Good    Clinical Impairments Affecting Rehab Potential  high arches     PT Frequency  2x / week    PT Duration  8 weeks    PT Treatment/Interventions  ADLs/Self Care Home Management;Cryotherapy;Electrical Stimulation;Iontophoresis 36m/ml Dexamethasone;Stair training;Gait training;Traction;Ultrasound;Therapeutic activities;Therapeutic exercise;Patient/family education;Passive range of motion;Manual techniques;Splinting;Taping;Dry needling;Neuromuscular re-education    PT Next Visit Plan  Lt ankle DF stretch, gross lumbopelvic strength & balance    PT Home Exercise Plan  pirifromis stretch; single knee to chest; hamstring stretch; supine clamshell; bridge, standing hip extension     Consulted and Agree with Plan of Care  Patient       Patient will benefit from skilled therapeutic intervention in order to improve the following deficits and impairments:  Abnormal gait, Pain, Decreased strength, Decreased endurance, Decreased activity tolerance  Visit Diagnosis: Pain in right hip  Muscle weakness (generalized)  Difficulty in walking, not elsewhere classified  PHYSICAL THERAPY DISCHARGE SUMMARY  Visits from Start of Care: 12  Current functional level related to goals / functional outcomes: Back to working out and walking with minimal pain    Remaining deficits: Intdermittent pain. Still not consistent with POC  Education /  Equipment: HEP Plan: Patient agrees to discharge.  Patient goals were not met. Patient is being discharged due to meeting the stated rehab goals.  ?????       Problem List Patient Active Problem List   Diagnosis Date Noted  . Right hip pain 07/14/2017  . Chest pain 06/11/2015  . Hypothyroidism 06/11/2015    Carney Living PT DPT  12/09/2017, 10:31 AM  [facility] [address] [place], [place], [address] Phone: [phone]   Fax:  [phone]  Name: ANNALEIA PENCE MRN: [id number] Date of Birth: 09-23-1941

## 2018-01-08 ENCOUNTER — Emergency Department (HOSPITAL_COMMUNITY): Admission: EM | Admit: 2018-01-08 | Payer: Medicare Other | Source: Home / Self Care

## 2019-10-25 ENCOUNTER — Ambulatory Visit
Admission: RE | Admit: 2019-10-25 | Discharge: 2019-10-25 | Disposition: A | Discharge status: Home / Self Care | Payer: Medicare Other | Source: Ambulatory Visit | Attending: Internal Medicine | Admitting: Internal Medicine

## 2019-10-25 ENCOUNTER — Other Ambulatory Visit: Payer: Self-pay | Admitting: Internal Medicine

## 2019-10-25 ENCOUNTER — Other Ambulatory Visit: Payer: Medicare Other

## 2019-10-25 DIAGNOSIS — R109 Unspecified abdominal pain: Secondary | ICD-10-CM

## 2019-10-25 DIAGNOSIS — R319 Hematuria, unspecified: Secondary | ICD-10-CM

## 2019-10-27 ENCOUNTER — Ambulatory Visit: Payer: Medicare Other | Admitting: Sports Medicine

## 2019-10-27 ENCOUNTER — Other Ambulatory Visit: Payer: Self-pay

## 2019-10-27 ENCOUNTER — Encounter: Payer: Self-pay | Admitting: Sports Medicine

## 2019-10-27 VITALS — BP 167/70 | Ht 66.0 in | Wt 148.0 lb

## 2019-10-27 DIAGNOSIS — M25552 Pain in left hip: Secondary | ICD-10-CM | POA: Insufficient documentation

## 2019-10-27 DIAGNOSIS — M545 Low back pain, unspecified: Secondary | ICD-10-CM

## 2019-10-27 DIAGNOSIS — M25551 Pain in right hip: Secondary | ICD-10-CM | POA: Diagnosis not present

## 2019-10-27 NOTE — Assessment & Plan Note (Signed)
History and exam today most consistent with mild osteoarthritis of the hip.  Hip flexor tendinopathy is possible though less likely.  Recent CT pelvis shows mild degenerative changes of the left hip. We will move forward with MRI of the hip for better evaluation of soft tissue and potential degenerative changes.  Following MRI, will consider a cortisone injection which would help provide a definitive diagnosis of osteoarthritis of the hip.  At present, this seems to be only a mild inconvenience and has not been significantly impacting her quality of life.  If her upcoming imaging and treatment is consistent with osteoarthritis, will have a more thorough discussion regarding possible hip replacement. 

## 2019-10-27 NOTE — Assessment & Plan Note (Deleted)
History and exam today most consistent with mild osteoarthritis of the hip.  Hip flexor tendinopathy is possible though less likely.  Recent CT pelvis shows mild degenerative changes of the left hip. We will move forward with MRI of the hip for better evaluation of soft tissue and potential degenerative changes.  Following MRI, will consider a cortisone injection which would help provide a definitive diagnosis of osteoarthritis of the hip.  At present, this seems to be only a mild inconvenience and has not been significantly impacting her quality of life.  If her upcoming imaging and treatment is consistent with osteoarthritis, will have a more thorough discussion regarding possible hip replacement.

## 2019-10-27 NOTE — Assessment & Plan Note (Addendum)
Differential includes paraspinal muscle strain, progression of lumbar spine osteoarthritis.  Most likely muscular.  She is not aware of any trauma or changes in her exercise regimen though she does note significantly increased stress in her life recently due to 2 deaths in the family.  Low suspicion that this is related to the degenerative changes in the lumbar spine noted on recent CT.  We will plan to treat conservatively for now. -Ibuprofen as needed -Robaxin as needed -Heat as needed -Call clinic if no improvement in the next 1 to 2 weeks

## 2019-10-27 NOTE — Progress Notes (Signed)
Brenda Ortiz is a 78 y.o. female who presents to [facility] today for the following: Chronic left hip pain and new low back pain.  Left hip pain She reports that her left hip pain has remained unchanged for several years now.  She has mild hip pain on a daily basis.  This pain is an achy sensation with a 5/10 severity that she notices throughout the day and is worsened by some exercises.  The pain is primarily in the front of her hip where her yoga instructor suggested she may have a hip flexor injury.  She does not notice any sharp pain or numbness or tingling.  She most often has discomfort with walking and yoga.  Overall, this is not a debilitating discomfort more of an acute awareness that her left hip is not moving or functioning perfectly.  Low back pain She first noticed her moderate low back pain 5 days ago.  She does not recall any trauma or changes in her exercise regimen.  (She rides her stationary bike 30 minutes a day and performs yoga 2 times a week) on the first day she noticed the back pain, it was incredibly uncomfortable and has been slowly improving.  The pain is generally an achy back pain that she first noted along her right lower back and has spread to both sides now.  She has been applying a heat pad regularly in addition to 200-400 mg of ibuprofen daily and occasional Robaxin.    PMH reviewed.  ROS as above. Medications reviewed.  Exam:  BP (!) 167/70   Ht 5\' 6"  (1.676 m)   Wt 148 lb (67.1 kg)   BMI 23.89 kg/m  Gen: Well NAD  Lower back Inspection: normal curvature of lumbar spine Palpation: Nontender to percussion over spinous processes.  Mild tenderness to palpation of the right paraspinal muscles.  No tenderness over SI joints bilaterally. ROM: Good ROM with axial rotation of the lumbar spine.  Mild reproducible discomfort with rotation to the right.  No pain and good ROM with lumbar flexion and extension. Neurovascular: Straight leg raise negative bilaterally.   Sensation to light touch intact. Gait: antalgic  Left hip MSK: Normal musculature.  No evident atrophy or hypertrophy. Palpation:No tenderness to palpation of the left trochanter. Mild tenderness to palpation roughly 5 cm below her ASIS. ROM: Decreased range of motion with internal rotation of the hips while seated.   Strength: 5/5 strength with hip flexion, knee flexion, knee extension bilaterally. Neurovascular: Sensation to light touch intact.  Straight leg raise negative. Special test: Discomfort elicited with internal and external rotation of the left hip while lying down and while seated.  Assessment and Plan: 1) Low back pain Differential includes paraspinal muscle strain, progression of lumbar spine osteoarthritis.  Most likely muscular.  She is not aware of any trauma or changes in her exercise regimen though she does note significantly increased stress in her life recently due to 2 deaths in the family.  Low suspicion that this is related to the degenerative changes in the lumbar spine noted on recent CT.  We will plan to treat conservatively for now. -Ibuprofen as needed -Robaxin as needed -Heat as needed -Call clinic if no improvement in the next 1 to 2 weeks  Left hip pain History and exam today most consistent with mild osteoarthritis of the hip.  Hip flexor tendinopathy is possible though less likely.  Recent CT pelvis shows mild degenerative changes of the left hip. We will move  forward with MRI of the hip for better evaluation of soft tissue and potential degenerative changes.  Following MRI, will consider a cortisone injection which would help provide a definitive diagnosis of osteoarthritis of the hip.  At present, this seems to be only a mild inconvenience and has not been significantly impacting her quality of life.  If her upcoming imaging and treatment is consistent with osteoarthritis, will have a more thorough discussion regarding possible hip replacement.   Patient  seen and evaluated with the resident.  I agree with the above plan of care.  Patient has been under my care for hip pain for several months.  She has had exhaustive physical therapy with minimal improvement.  X-rays and a recent CT scan did suggest mild degenerative changes in this left hip but I think they are more advanced than what these images show.  In order to differentiate OA from soft tissue pathology I recommended an MRI scan of the left hip.  Phone follow-up with those results when available.  If OA is confirmed then I would recommend a single diagnostic intra-articular hip injection prior to total hip arthroplasty.  Patient agrees with this plan.

## 2019-10-27 NOTE — Patient Instructions (Addendum)
It was a nice to meet you today Brenda Ortiz.  Here is a quick summary of the 2 problems that we talked about:  Back pain: It seems like this is most likely a muscular issue probably related to the increased stress in her life.  I expect this to get better in the next 1 to 2 weeks.  I think that you doing all of the right things for treatment.  You can continue to take ibuprofen as we discussed in addition to Robaxin if helpful.  Heat is also helpful.  Please give [place] a call if you do not experience any improvement in the next 2 weeks.  Hip pain: Based on your history and physical, this is very likely some osteoarthritis of your left hip.  Were going to get an MRI for further evaluation and better imaging of the soft tissue (tendons and muscles) of your hip.  Following the MRI, we will consider a cortisone injection.

## 2019-11-14 ENCOUNTER — Other Ambulatory Visit: Payer: Medicare Other

## 2019-11-19 ENCOUNTER — Other Ambulatory Visit: Payer: Self-pay

## 2019-11-19 ENCOUNTER — Ambulatory Visit
Admission: RE | Admit: 2019-11-19 | Discharge: 2019-11-19 | Disposition: A | Discharge status: Home / Self Care | Payer: Medicare Other | Source: Ambulatory Visit | Attending: Sports Medicine | Admitting: Sports Medicine

## 2019-11-19 DIAGNOSIS — M25552 Pain in left hip: Secondary | ICD-10-CM

## 2019-11-21 ENCOUNTER — Telehealth: Payer: Self-pay | Admitting: Sports Medicine

## 2019-11-21 ENCOUNTER — Other Ambulatory Visit: Payer: Self-pay

## 2019-11-21 DIAGNOSIS — M25552 Pain in left hip: Secondary | ICD-10-CM

## 2019-11-21 NOTE — Telephone Encounter (Signed)
  I spoke with Brenda Ortiz on the phone today after reviewing the MRI of her left hip.  She has moderate to severe left hip osteoarthritis.  Based on this finding I recommended consultation with Dr. Magnus Ivan to discuss further treatment.  Although a cortisone injection may temporarily help, definitive treatment is a total hip arthroplasty and the patient has had symptoms now for several years.  I will defer further treatment to the discretion of Dr. Magnus Ivan and the patient will follow up with me as needed.

## 2019-11-22 ENCOUNTER — Ambulatory Visit: Payer: Medicare Other | Admitting: Sports Medicine

## 2019-12-01 ENCOUNTER — Ambulatory Visit: Payer: Self-pay

## 2019-12-01 ENCOUNTER — Ambulatory Visit (INDEPENDENT_AMBULATORY_CARE_PROVIDER_SITE_OTHER): Payer: Medicare PPO

## 2019-12-01 ENCOUNTER — Ambulatory Visit (INDEPENDENT_AMBULATORY_CARE_PROVIDER_SITE_OTHER): Payer: Medicare PPO | Admitting: Orthopaedic Surgery

## 2019-12-01 ENCOUNTER — Encounter: Payer: Self-pay | Admitting: Orthopaedic Surgery

## 2019-12-01 ENCOUNTER — Other Ambulatory Visit: Payer: Self-pay

## 2019-12-01 DIAGNOSIS — M25552 Pain in left hip: Secondary | ICD-10-CM

## 2019-12-01 DIAGNOSIS — M1612 Unilateral primary osteoarthritis, left hip: Secondary | ICD-10-CM

## 2019-12-01 NOTE — Progress Notes (Deleted)
Office Visit Note   Patient: Brenda Ortiz           Date of Birth: August 28, 1941           MRN: [id number] Visit Date: 12/01/2019              Requested by: Thurman Coyer, DO [address],  [place] [address] PCP: Prince Solian, MD   Assessment & Plan: Visit Diagnoses:  1. Pain in left hip   2. Unilateral primary osteoarthritis, left hip     Plan: ***  Follow-Up Instructions: No follow-ups on file.   Orders:  Orders Placed This Encounter  Procedures  . XR HIP UNILAT W OR W/O PELVIS 2-3 VIEWS LEFT  . XR HIP UNILAT W OR W/O PELVIS 2-3 VIEWS LEFT   No orders of the defined types were placed in this encounter.     Procedures: No procedures performed   Clinical Data: No additional findings.   Subjective: Chief Complaint  Patient presents with  . Left Hip - Pain    HPI  Review of Systems   Objective: Vital Signs: There were no vitals taken for this visit.  Physical Exam  Ortho  Exam  Specialty Comments:  No specialty comments available.  Imaging: No results found.   PMFS History: Patient Active Problem List   Diagnosis Date Noted  . Unilateral primary  osteoarthritis, left hip 12/01/2019  . Low back pain 10/27/2019  . Left hip pain 10/27/2019  . Right hip pain 07/14/2017  . Chest pain 06/11/2015  . Hypothyroidism 06/11/2015   Past Medical History:  Diagnosis Date  . Hypothyroidism   . Incontinence   . Thyroid disease     Family History  Problem Relation Age of Onset  . Arthritis Mother   . Arthritis Father   . Diabetes Father   . Diabetes Brother   . Diabetes Paternal Grandmother     Past Surgical History:  Procedure Laterality Date  . DILATION AND CURETTAGE OF UTERUS    . FOOT SURGERY Right   . HYSTEROSCOPY WITH D & C N/A 08/14/2016   Procedure: DILATATION AND CURETTAGE /HYSTEROSCOPY;  Surgeon: Olivia Mackie, MD;  Location: [facility];  Service: Gynecology;  Laterality: N/A;  . TONSILLECTOMY     Social History   Occupational History  . Not on file  Tobacco Use  . Smoking status: Former Smoker    Packs/day: 0.50    Years: 10.00    Pack years: 5.00    Types: Cigarettes    Quit date: 11/17/1970    Years since quitting: 49.0  . Smokeless tobacco: Never Used  Substance and Sexual Activity  . Alcohol use: Yes    Alcohol/week: 0.0 standard drinks    Comment: 2-3 times a week  . Drug use: No  . Sexual activity: Not on file

## 2019-12-01 NOTE — Progress Notes (Signed)
Office Visit Note   Patient: Brenda Ortiz           Date of Birth: 12/08/1940           MRN: [id number] Visit Date: 12/01/2019              Requested by: Thurman Coyer, DO [address],  [place] [address] PCP: Prince Solian, MD   Assessment & Plan: Visit Diagnoses:  1. Pain in left hip   2. Unilateral primary osteoarthritis, left hip     Plan: I went over her x-rays in detail with her including her CT and MRI.  She does understand that she has significant arthritis in her left hip.  I do feel at some point given her high level activities and function and given the arthritis in her hip that she would benefit from a hip replacement.  She does wish to try an intra-articular steroid injection in her left hip and I agree with this treatment plan.  I would like her to see my partner Dr. Junius Roads next week as a consultation for an ultrasound-guided intra-articular steroid injection in her left hip.  He can then have her come back and see me 3 to 4 weeks after that injection.  All question concerns were answered and addressed.  Follow-Up Instructions: No follow-ups on file.   Orders:  Orders Placed This Encounter  Procedures  . XR HIP UNILAT W OR W/O PELVIS 2-3 VIEWS LEFT  . XR HIP UNILAT W OR W/O PELVIS 2-3 VIEWS LEFT   No orders of the defined types were placed in this encounter.     Procedures: No procedures performed   Clinical Data: No additional findings.   Subjective: Chief Complaint  Patient presents with  . Left Hip - Pain  The patient is a very pleasant and active 79 year old female who has been having worsening left hip pain for several months now.  She actually has a CT scan of her abdomen and pelvis as well as an MRI.  We did get plain films today.  She does hurt only in the groin on that left hip.  It hurts with pivoting activities.  She is active with Pilates and yoga and she has been experiencing worsening pain and stiffness with those  activities.  She is never had any surgery on her hip before.  She denies any specific injury.  She has had no active medical issues otherwise.  HPI  Review of Systems She currently denies any headache, chest pain, shortness of breath, fever, chills, nausea, vomiting  Objective: Vital Signs: There were no vitals taken for this visit.  Physical Exam She is alert and orient x3 and in no acute distress.  She gets up on the exam table easily.  She walks with just a slight limp.  She does not walk with an assistive device. Ortho Exam Examination of her right hip is normal examination of her left hip shows stiffness and pain with internal and external rotation. Specialty Comments:  No specialty comments available.  Imaging: XR HIP UNILAT W OR W/O PELVIS 2-3 VIEWS LEFT  Result Date: 12/01/2019 An AP pelvis and lateral of the left hip shows significant arthritic changes in the left hip with joint space narrowing and sclerotic changes as well as particular osteophytes.  XR HIP UNILAT W OR W/O PELVIS 2-3 VIEWS LEFT  Result Date: 12/01/2019 A low AP pelvis and lateral left hip shows significant arthritic changes of left hip.  The  MRI is independently reviewed of her left hip and it does show moderate to severe arthritic changes with joint effusion, degenerative labral tearing, significant cartilage wear on the femoral head and acetabulum as well as signal changes in the bone itself.  PMFS History: Patient Active Problem List   Diagnosis Date Noted  . Unilateral primary osteoarthritis, left hip 12/01/2019  . Low back pain 10/27/2019  . Left hip pain 10/27/2019  . Right hip pain 07/14/2017  . Chest pain 06/11/2015  . Hypothyroidism 06/11/2015   Past Medical History:  Diagnosis Date  . Hypothyroidism   . Incontinence   . Thyroid disease     Family History  Problem Relation Age of Onset  . Arthritis Mother   . Arthritis Father   . Diabetes Father   . Diabetes Brother   . Diabetes  Paternal Grandmother     Past Surgical History:  Procedure Laterality Date  . DILATION AND CURETTAGE OF UTERUS    . FOOT SURGERY Right   . HYSTEROSCOPY WITH D & C N/A 08/14/2016   Procedure: DILATATION AND CURETTAGE /HYSTEROSCOPY;  Surgeon: Olivia Mackie, MD;  Location: [facility];  Service: Gynecology;  Laterality: N/A;  . TONSILLECTOMY     Social History   Occupational History  . Not on file  Tobacco Use  . Smoking status: Former Smoker    Packs/day: 0.50    Years: 10.00    Pack years: 5.00    Types: Cigarettes    Quit date: 11/17/1970    Years since quitting: 49.0  . Smokeless tobacco: Never Used  Substance and Sexual Activity  . Alcohol use: Yes    Alcohol/week: 0.0 standard drinks    Comment: 2-3 times a week  . Drug use: No  . Sexual activity: Not on file

## 2019-12-06 ENCOUNTER — Encounter: Payer: Self-pay | Admitting: Family Medicine

## 2019-12-06 ENCOUNTER — Ambulatory Visit: Payer: Self-pay

## 2019-12-06 ENCOUNTER — Ambulatory Visit: Payer: Medicare PPO | Admitting: Family Medicine

## 2019-12-06 ENCOUNTER — Other Ambulatory Visit: Payer: Self-pay

## 2019-12-06 DIAGNOSIS — M1612 Unilateral primary osteoarthritis, left hip: Secondary | ICD-10-CM | POA: Diagnosis not present

## 2019-12-06 NOTE — Progress Notes (Signed)
Subjective: Patient is here for ultrasound-guided intra-articular left hip injection.   Left-sided groin pain and stiffness with DJD.  Interestingly, her father was the first patient to ever have a hip replacement.  Objective: She has pain with internal hip rotation.  Procedure: Ultrasound-guided left hip injection: After sterile prep with Betadine, injected 8 cc 1% lidocaine without epinephrine and 40 mg methylprednisolone using a 22-gauge spinal needle, passing the needle through the iliofemoral ligament into the femoral head/neck junction.  Injectate was seen filling the joint capsule.  She had very good immediate relief.  She will follow up with Dr. Magnus Ivan as scheduled.

## 2019-12-12 ENCOUNTER — Ambulatory Visit: Payer: Medicare PPO | Attending: Internal Medicine

## 2019-12-12 DIAGNOSIS — Z23 Encounter for immunization: Secondary | ICD-10-CM

## 2019-12-12 NOTE — Progress Notes (Signed)
   Covid-19 Vaccination Clinic  Name:  ALZINA GOLDA    MRN: [id number] DOB: [birth year]  12/12/2019  Ms. Brocks was observed post Covid-19 immunization for 15 minutes without incidence. She was provided with Vaccine Information Sheet and instruction to access the V-Safe system.   Ms. Carley was instructed to call 911 with any severe reactions post vaccine: Marland Kitchen Difficulty breathing  . Swelling of your face and throat  . A fast heartbeat  . A bad rash all over your body  . Dizziness and weakness    Immunizations Administered    Name Date Dose VIS Date Route   Pfizer COVID-19 Vaccine 12/12/2019  8:46 AM 0.3 mL 10/28/2019 Intramuscular   Manufacturer: [employer], [employer]   Lot: [id number]   NDC: [id number]

## 2020-01-02 ENCOUNTER — Ambulatory Visit: Payer: Medicare PPO | Admitting: Orthopaedic Surgery

## 2020-01-02 ENCOUNTER — Other Ambulatory Visit: Payer: Self-pay

## 2020-01-02 ENCOUNTER — Ambulatory Visit: Payer: Medicare PPO | Attending: Internal Medicine

## 2020-01-02 DIAGNOSIS — Z23 Encounter for immunization: Secondary | ICD-10-CM

## 2020-01-02 NOTE — Progress Notes (Signed)
   Covid-19 Vaccination Clinic  Name:  AMEISHA MCCLELLAN    MRN: [id number] DOB: 1941/08/25  01/02/2020  Ms. Gayler was observed post Covid-19 immunization for 15 minutes without incidence. She was provided with Vaccine Information Sheet and instruction to access the V-Safe system.   Ms. Yazdi was instructed to call 911 with any severe reactions post vaccine: Marland Kitchen Difficulty breathing  . Swelling of your face and throat  . A fast heartbeat  . A bad rash all over your body  . Dizziness and weakness    Immunizations Administered    Name Date Dose VIS Date Route   Pfizer COVID-19 Vaccine 01/02/2020  9:16 AM 0.3 mL 10/28/2019 Intramuscular   Manufacturer: [employer], [employer]   Lot: [id number]   NDC: [id number]

## 2020-01-03 ENCOUNTER — Encounter: Payer: Self-pay | Admitting: Orthopaedic Surgery

## 2020-01-03 ENCOUNTER — Ambulatory Visit: Payer: Medicare PPO | Admitting: Orthopaedic Surgery

## 2020-01-03 DIAGNOSIS — M1612 Unilateral primary osteoarthritis, left hip: Secondary | ICD-10-CM

## 2020-01-03 NOTE — Progress Notes (Signed)
The patient comes in today for continued follow-up.  She has known significant arthritis of her left hip.  This is now detrimentally affecting her actives daily living, her quality of life and her mobility.  She does walk with a limp.  Her pain is in the groin.  Her x-rays her last visit shows the extent of her osteoarthritis of her left hip.  She did try an intra-articular left hip injection under ultrasound.  That was with a steroid.  She says it felt just only minimally better after that injection.  She says it is becoming more aggravating to her.  She cannot walk and be as active as she would like to be at this point.  She is otherwise a very healthy 79 years old.  I had given her a handout before about hip replacement surgery.  Today I showed her hip model.  We talked in detail about what her interoperative and postoperative course would involve with hip replacement surgery.  Given the failure of conservative treatment for now over a year and her worsening signs and symptoms I agree that a hip replacement is the next step for her.  On exam she does have significant pain with the left hip during internal and external rotation and compression of the hip itself.  She has no other active medical issues.  She is not on blood thinning medication.  We talked about her interoperative and postoperative course and what to expect with this type of surgery.  At this point we will work on getting her scheduled.  All question concerns were answered and addressed.

## 2020-01-16 ENCOUNTER — Other Ambulatory Visit: Payer: Self-pay

## 2020-01-20 ENCOUNTER — Other Ambulatory Visit: Payer: Self-pay | Admitting: Physician Assistant

## 2020-01-26 ENCOUNTER — Encounter (HOSPITAL_COMMUNITY): Payer: Self-pay

## 2020-01-26 NOTE — Patient Instructions (Addendum)
DUE TO COVID-19 ONLY ONE VISITOR IS ALLOWED TO COME WITH YOU AND STAY IN THE WAITING ROOM ONLY DURING PRE OP AND PROCEDURE. THE ONE VISITOR MAY VISIT WITH YOU IN YOUR PRIVATE ROOM DURING VISITING HOURS ONLY!!   COVID SWAB TESTING MUST BE COMPLETED ON: Tuesday, January 31, 2020 at  9:20AM  [address], [place] -[facility] enter pre surgical testing line (Must self quarantine after testing. Follow instructions on handout.)             Your procedure is scheduled on: Friday, February 03, 2020   Report to [facility] Main  Entrance    Report to admitting at 7:15 AM   Call this number if you have problems the morning of surgery [phone]   Do not eat food:After Midnight.   May have liquids until 6:45 AM day of surgery   CLEAR LIQUID DIET  Foods Allowed                                                                     Foods Excluded  Water, Black Coffee and tea, regular and decaf                             liquids that you cannot  Plain Jell-O in any flavor  (No red)                                           see through such as: Fruit ices (not with fruit pulp)                                     milk, soups, orange juice  Iced Popsicles (No red)                                    All solid food Carbonated beverages, regular and diet                                    Apple juices Sports drinks like Gatorade (No red) Lightly seasoned clear broth or consume(fat free) Sugar, honey syrup  Sample Menu Breakfast                                Lunch                                     Supper Cranberry juice                    Beef broth                            Chicken broth Jell-O  Grape juice                           Apple juice Coffee or tea                        Jell-O                                      Popsicle                                                Coffee or tea                        Coffee or  tea   Complete one Ensure drink the morning of surgery at 6:45 AM the day of surgery.   Oral Hygiene is also important to reduce your risk of infection.                                    Remember - BRUSH YOUR TEETH THE MORNING OF SURGERY WITH YOUR REGULAR TOOTHPASTE   Do NOT smoke after Midnight   Take these medicines the morning of surgery with A SIP OF WATER: Synthroid, Allegra                               You may not have any metal on your body including hair pins, jewelry, and body piercings             Do not wear make-up, lotions, powders, perfumes/cologne, or deodorant             Do not wear nail polish.  Do not shave  48 hours prior to surgery.               Do not bring valuables to the hospital. [place].   Contacts, dentures or bridgework may not be worn into surgery.   Bring small overnight bag day of surgery.    Special Instructions: Bring a copy of your healthcare power of attorney and living will documents         the day of surgery if you haven't scanned them in before.              Please read over the following fact sheets you were given:  [facility] - Preparing for Surgery Before surgery, you can play an important role.  Because skin is not sterile, your skin needs to be as free of germs as possible.  You can reduce the number of germs on your skin by washing with CHG (chlorahexidine gluconate) soap before surgery.  CHG is an antiseptic cleaner which kills germs and bonds with the skin to continue killing germs even after washing. Please DO NOT use if you have an allergy to CHG or antibacterial soaps.  If your skin becomes reddened/irritated stop using the CHG and inform your nurse when you arrive at Short Stay. Do not shave (  including legs and underarms) for at least 48 hours prior to the first CHG shower.  You may shave your face/neck.  Please follow these instructions carefully:  1.  Shower with CHG Soap the  night before surgery and the  morning of surgery.  2.  If you choose to wash your hair, wash your hair first as usual with your normal  shampoo.  3.  After you shampoo, rinse your hair and body thoroughly to remove the shampoo.                             4.  Use CHG as you would any other liquid soap.  You can apply chg directly to the skin and wash.  Gently with a scrungie or clean washcloth.  5.  Apply the CHG Soap to your body ONLY FROM THE NECK DOWN.   Do   not use on face/ open                           Wound or open sores. Avoid contact with eyes, ears mouth and   genitals (private parts).                       Wash face,  Genitals (private parts) with your normal soap.             6.  Wash thoroughly, paying special attention to the area where your    surgery  will be performed.  7.  Thoroughly rinse your body with warm water from the neck down.  8.  DO NOT shower/wash with your normal soap after using and rinsing off the CHG Soap.                9.  Pat yourself dry with a clean towel.            10.  Wear clean pajamas.            11.  Place clean sheets on your bed the night of your first shower and do not  sleep with pets. Day of Surgery : Do not apply any lotions/deodorants the morning of surgery.  Please wear clean clothes to the hospital/surgery center.  FAILURE TO FOLLOW THESE INSTRUCTIONS MAY RESULT IN THE CANCELLATION OF YOUR SURGERY  PATIENT SIGNATURE_________________________________  NURSE SIGNATURE__________________________________  ________________________________________________________________________   Rogelia Mire  An incentive spirometer is a tool that can help keep your lungs clear and active. This tool measures how well you are filling your lungs with each breath. Taking long deep breaths may help reverse or decrease the chance of developing breathing (pulmonary) problems (especially infection) following:  A long period of time when you are unable to  move or be active. BEFORE THE PROCEDURE   If the spirometer includes an indicator to show your best effort, your nurse or respiratory therapist will set it to a desired goal.  If possible, sit up straight or lean slightly forward. Try not to slouch.  Hold the incentive spirometer in an upright position. INSTRUCTIONS FOR USE  1. Sit on the edge of your bed if possible, or sit up as far as you can in bed or on a chair. 2. Hold the incentive spirometer in an upright position. 3. Breathe out normally. 4. Place the mouthpiece in your mouth and seal your lips tightly around it. 5. Breathe in slowly and as deeply  as possible, raising the piston or the ball toward the top of the column. 6. Hold your breath for 3-5 seconds or for as long as possible. Allow the piston or ball to fall to the bottom of the column. 7. Remove the mouthpiece from your mouth and breathe out normally. 8. Rest for a few seconds and repeat Steps 1 through 7 at least 10 times every 1-2 hours when you are awake. Take your time and take a few normal breaths between deep breaths. 9. The spirometer may include an indicator to show your best effort. Use the indicator as a goal to work toward during each repetition. 10. After each set of 10 deep breaths, practice coughing to be sure your lungs are clear. If you have an incision (the cut made at the time of surgery), support your incision when coughing by placing a pillow or rolled up towels firmly against it. Once you are able to get out of bed, walk around indoors and cough well. You may stop using the incentive spirometer when instructed by your caregiver.  RISKS AND COMPLICATIONS  Take your time so you do not get dizzy or light-headed.  If you are in pain, you may need to take or ask for pain medication before doing incentive spirometry. It is harder to take a deep breath if you are having pain. AFTER USE  Rest and breathe slowly and easily.  It can be helpful to keep track of  a log of your progress. Your caregiver can provide you with a simple table to help with this. If you are using the spirometer at home, follow these instructions: SEEK MEDICAL CARE IF:   You are having difficultly using the spirometer.  You have trouble using the spirometer as often as instructed.  Your pain medication is not giving enough relief while using the spirometer.  You develop fever of 100.5 F (38.1 C) or higher. SEEK IMMEDIATE MEDICAL CARE IF:   You cough up bloody sputum that had not been present before.  You develop fever of 102 F (38.9 C) or greater.  You develop worsening pain at or near the incision site. MAKE SURE YOU:   Understand these instructions.  Will watch your condition.  Will get help right away if you are not doing well or get worse. Document Released: 03/16/2007 Document Revised: 01/26/2012 Document Reviewed: 05/17/2007 [facility] Patient Information 2014 [place], [place].   ________________________________________________________________________

## 2020-01-30 ENCOUNTER — Other Ambulatory Visit: Payer: Self-pay

## 2020-01-30 ENCOUNTER — Encounter (HOSPITAL_COMMUNITY): Payer: Self-pay

## 2020-01-30 ENCOUNTER — Encounter (HOSPITAL_COMMUNITY)
Admission: RE | Admit: 2020-01-30 | Discharge: 2020-01-30 | Disposition: A | Discharge status: Home / Self Care | Payer: Medicare PPO | Source: Ambulatory Visit | Attending: Orthopaedic Surgery | Admitting: Orthopaedic Surgery

## 2020-01-30 HISTORY — DX: Unspecified osteoarthritis, unspecified site: M19.90

## 2020-01-30 HISTORY — DX: Other seasonal allergic rhinitis: J30.2

## 2020-01-30 NOTE — Progress Notes (Signed)
Brenda Ortiz has completed her COVID 55 vaccination series  PCP - Dr. Felipa Eth Cardiologist - N/A  Chest x-ray - N/A EKG - N/A Stress Test - N/A ECHO - N/A Cardiac Cath - N/A  Sleep Study - N/A CPAP - N/A   Fasting Blood Sugar - N/A Checks Blood Sugar __N/A___ times a day  Blood Thinner Instructions: N/A Aspirin Instructions: N/A Last Dose: N/A  Anesthesia review: N/A  Patient denies shortness of breath, fever, cough and chest pain at PAT appointment   Patient verbalized understanding of instructions that were given to them at the PAT appointment. Patient was also instructed that they will need to review over the PAT instructions again at home before surgery.

## 2020-01-31 ENCOUNTER — Other Ambulatory Visit (HOSPITAL_COMMUNITY)
Admission: RE | Admit: 2020-01-31 | Discharge: 2020-01-31 | Disposition: A | Discharge status: Home / Self Care | Payer: Medicare PPO | Source: Ambulatory Visit | Attending: Orthopaedic Surgery | Admitting: Orthopaedic Surgery

## 2020-01-31 ENCOUNTER — Encounter (HOSPITAL_COMMUNITY)
Admission: RE | Admit: 2020-01-31 | Discharge: 2020-01-31 | Disposition: A | Discharge status: Home / Self Care | Payer: Medicare PPO | Source: Ambulatory Visit | Attending: Orthopaedic Surgery | Admitting: Orthopaedic Surgery

## 2020-01-31 DIAGNOSIS — Z20822 Contact with and (suspected) exposure to covid-19: Secondary | ICD-10-CM | POA: Diagnosis not present

## 2020-01-31 DIAGNOSIS — Z01812 Encounter for preprocedural laboratory examination: Secondary | ICD-10-CM | POA: Diagnosis present

## 2020-01-31 LAB — CBC
HCT: 41.3 % (ref 36.0–46.0)
Hemoglobin: 13.5 g/dL (ref 12.0–15.0)
MCH: 31.8 pg (ref 26.0–34.0)
MCHC: 32.7 g/dL (ref 30.0–36.0)
MCV: 97.2 fL (ref 80.0–100.0)
Platelets: 239 10*3/uL (ref 150–400)
RBC: 4.25 MIL/uL (ref 3.87–5.11)
RDW: 13.2 % (ref 11.5–15.5)
WBC: 7.2 10*3/uL (ref 4.0–10.5)
nRBC: 0 % (ref 0.0–0.2)

## 2020-01-31 LAB — SARS CORONAVIRUS 2 (TAT 6-24 HRS): SARS Coronavirus 2: NEGATIVE

## 2020-01-31 LAB — SURGICAL PCR SCREEN
MRSA, PCR: NEGATIVE
Staphylococcus aureus: POSITIVE — AB

## 2020-02-02 NOTE — H&P (Signed)
TOTAL HIP ADMISSION H&P  Patient is admitted for left total hip arthroplasty.  Subjective:  Chief Complaint: left hip pain  HPI: Brenda Ortiz, 79 y.o. female, has a history of pain and functional disability in the left hip(s) due to arthritis and patient has failed non-surgical conservative treatments for greater than 12 weeks to include NSAID's and/or analgesics, corticosteriod injections, use of assistive devices and activity modification.  Onset of symptoms was gradual starting 2 years ago with gradually worsening course since that time.The patient noted no past surgery on the left hip(s).  Patient currently rates pain in the left hip at 10 out of 10 with activity. Patient has night pain, worsening of pain with activity and weight bearing, pain that interfers with activities of daily living and pain with passive range of motion. Patient has evidence of subchondral cysts, subchondral sclerosis, periarticular osteophytes and joint space narrowing by imaging studies. This condition presents safety issues increasing the risk of falls.  There is no current active infection.  Patient Active Problem List   Diagnosis Date Noted  . Unilateral primary osteoarthritis, left hip 12/01/2019  . Low back pain 10/27/2019  . Left hip pain 10/27/2019  . Right hip pain 07/14/2017  . Chest pain 06/11/2015  . Hypothyroidism 06/11/2015   Past Medical History:  Diagnosis Date  . Arthritis   . Hypothyroidism   . Incontinence   . Seasonal allergies     Past Surgical History:  Procedure Laterality Date  . DILATION AND CURETTAGE OF UTERUS     x2  . FOOT SURGERY Right   . HYSTEROSCOPY WITH D & C N/A 08/14/2016   Procedure: DILATATION AND CURETTAGE /HYSTEROSCOPY;  Surgeon: Brien Few, MD;  Location: [facility];  Service: Gynecology;  Laterality: N/A;  . TONSILLECTOMY      No current facility-administered medications for this encounter.   Current Outpatient Medications  Medication Sig Dispense Refill  Last Dose  . acetaminophen (TYLENOL) 500 MG tablet Take 500 mg by mouth every 6 (six) hours as needed (for pain.).     Marland Kitchen calcium-vitamin D (OSCAL) 250-125 MG-UNIT per tablet Take 1 tablet by mouth at bedtime.      . fexofenadine (ALLEGRA) 180 MG tablet Take 180 mg by mouth daily as needed for allergies or rhinitis.     . Multiple Vitamin (MULTIVITAMIN WITH MINERALS) TABS tablet Take 1 tablet by mouth at bedtime.      . naproxen sodium (ALEVE) 220 MG tablet Take 220 mg by mouth daily as needed (pain.).     Marland Kitchen PREMPRO 0.625-2.5 MG per tablet Take 1 tablet by mouth at bedtime.      Marland Kitchen SYNTHROID 75 MCG tablet Take 75 mcg by mouth daily before breakfast.       No Known Allergies  Social History   Tobacco Use  . Smoking status: Former Smoker    Packs/day: 0.50    Years: 10.00    Pack years: 5.00    Types: Cigarettes    Quit date: 11/17/1970    Years since quitting: 49.2  . Smokeless tobacco: Never Used  Substance Use Topics  . Alcohol use: Yes    Alcohol/week: 0.0 standard drinks    Comment: 2-3 times a week    Family History  Problem Relation Age of Onset  . Arthritis Mother   . Arthritis Father   . Diabetes Father   . Diabetes Brother   . Diabetes Paternal Grandmother      Review of Systems  All other  systems reviewed and are negative.   Objective:  Physical Exam  Constitutional: She is oriented to person, place, and time. She appears well-developed and well-nourished.  HENT:  Head: Normocephalic and atraumatic.  Eyes: Pupils are equal, round, and reactive to light. EOM are normal.  Cardiovascular: Normal rate and regular rhythm.  Respiratory: Effort normal and breath sounds normal.  GI: Soft. Bowel sounds are normal.  Musculoskeletal:     Cervical back: Normal range of motion and neck supple.     Left hip: Tenderness and bony tenderness present. Decreased range of motion. Decreased strength.  Neurological: She is alert and oriented to person, place, and time.  Skin: Skin  is warm and dry.  Psychiatric: She has a normal mood and affect.    Vital signs in last 24 hours:    Labs:   Estimated body mass index is 24.12 kg/m as calculated from the following:   Height as of 01/31/20: 5\' 7"  (1.702 m).   Weight as of 01/31/20: 69.9 kg.   Imaging Review Plain radiographs demonstrate severe degenerative joint disease of the left hip(s). The bone quality appears to be good for age and reported activity level.      Assessment/Plan:  End stage arthritis, left hip(s)  The patient history, physical examination, clinical judgement of the provider and imaging studies are consistent with end stage degenerative joint disease of the left hip(s) and total hip arthroplasty is deemed medically necessary. The treatment options including medical management, injection therapy, arthroscopy and arthroplasty were discussed at length. The risks and benefits of total hip arthroplasty were presented and reviewed. The risks due to aseptic loosening, infection, stiffness, dislocation/subluxation,  thromboembolic complications and other imponderables were discussed.  The patient acknowledged the explanation, agreed to proceed with the plan and consent was signed. Patient is being admitted for inpatient treatment for surgery, pain control, PT, OT, prophylactic antibiotics, VTE prophylaxis, progressive ambulation and ADL's and discharge planning.The patient is planning to be discharged home with home health services

## 2020-02-03 ENCOUNTER — Ambulatory Visit (HOSPITAL_COMMUNITY): Payer: Medicare PPO

## 2020-02-03 ENCOUNTER — Observation Stay (HOSPITAL_COMMUNITY): Payer: Medicare PPO

## 2020-02-03 ENCOUNTER — Encounter (HOSPITAL_COMMUNITY)
Admission: RE | Disposition: A | Discharge status: Home / Self Care | Payer: Self-pay | Source: Ambulatory Visit | Attending: Orthopaedic Surgery

## 2020-02-03 ENCOUNTER — Ambulatory Visit (HOSPITAL_COMMUNITY): Payer: Medicare PPO | Admitting: Certified Registered Nurse Anesthetist

## 2020-02-03 ENCOUNTER — Encounter (HOSPITAL_COMMUNITY): Payer: Self-pay | Admitting: Orthopaedic Surgery

## 2020-02-03 ENCOUNTER — Observation Stay (HOSPITAL_COMMUNITY)
Admission: RE | Admit: 2020-02-03 | Discharge: 2020-02-05 | Disposition: A | Discharge status: Home / Self Care | Payer: Medicare PPO | Source: Ambulatory Visit | Attending: Orthopaedic Surgery | Admitting: Orthopaedic Surgery

## 2020-02-03 ENCOUNTER — Telehealth (HOSPITAL_COMMUNITY): Payer: Self-pay | Admitting: *Deleted

## 2020-02-03 ENCOUNTER — Other Ambulatory Visit: Payer: Self-pay

## 2020-02-03 DIAGNOSIS — E039 Hypothyroidism, unspecified: Secondary | ICD-10-CM | POA: Diagnosis not present

## 2020-02-03 DIAGNOSIS — Z79890 Hormone replacement therapy (postmenopausal): Secondary | ICD-10-CM | POA: Insufficient documentation

## 2020-02-03 DIAGNOSIS — Z96642 Presence of left artificial hip joint: Secondary | ICD-10-CM | POA: Diagnosis present

## 2020-02-03 DIAGNOSIS — Z419 Encounter for procedure for purposes other than remedying health state, unspecified: Secondary | ICD-10-CM

## 2020-02-03 DIAGNOSIS — M1612 Unilateral primary osteoarthritis, left hip: Secondary | ICD-10-CM | POA: Diagnosis not present

## 2020-02-03 DIAGNOSIS — Z87891 Personal history of nicotine dependence: Secondary | ICD-10-CM | POA: Insufficient documentation

## 2020-02-03 HISTORY — PX: TOTAL HIP ARTHROPLASTY: SHX124

## 2020-02-03 HISTORY — PX: JOINT REPLACEMENT: SHX530

## 2020-02-03 SURGERY — ARTHROPLASTY, HIP, TOTAL, ANTERIOR APPROACH
Anesthesia: Spinal | Site: Hip | Laterality: Left

## 2020-02-03 MED ORDER — STERILE WATER FOR IRRIGATION IR SOLN
Status: DC | PRN
  Administered 2020-02-03: 2000 mL

## 2020-02-03 MED ORDER — ONDANSETRON HCL 4 MG/2ML IJ SOLN
INTRAMUSCULAR | Status: AC
  Filled 2020-02-03: qty 2

## 2020-02-03 MED ORDER — TRANEXAMIC ACID-NACL 1000-0.7 MG/100ML-% IV SOLN
1000.0000 mg | INTRAVENOUS | Status: AC
  Administered 2020-02-03: 1000 mg via INTRAVENOUS
  Filled 2020-02-03: qty 100

## 2020-02-03 MED ORDER — EPHEDRINE 5 MG/ML INJ
INTRAVENOUS | Status: AC
  Filled 2020-02-03: qty 10

## 2020-02-03 MED ORDER — PHENOL 1.4 % MT LIQD: 1.0000 | OROMUCOSAL | Status: DC | PRN

## 2020-02-03 MED ORDER — EPHEDRINE SULFATE-NACL 50-0.9 MG/10ML-% IV SOSY
PREFILLED_SYRINGE | INTRAVENOUS | Status: DC | PRN
  Administered 2020-02-03 (×2): 5 mg via INTRAVENOUS

## 2020-02-03 MED ORDER — MENTHOL 3 MG MT LOZG: 1.0000 | LOZENGE | OROMUCOSAL | Status: DC | PRN

## 2020-02-03 MED ORDER — DOCUSATE SODIUM 100 MG PO CAPS
100.0000 mg | ORAL_CAPSULE | Freq: Two times a day (BID) | ORAL | Status: DC
  Administered 2020-02-03 – 2020-02-05 (×4): 100 mg via ORAL
  Filled 2020-02-03 (×4): qty 1

## 2020-02-03 MED ORDER — METHOCARBAMOL 500 MG IVPB - SIMPLE MED
500.0000 mg | Freq: Four times a day (QID) | INTRAVENOUS | Status: DC | PRN
  Filled 2020-02-03: qty 50

## 2020-02-03 MED ORDER — ASPIRIN 81 MG PO CHEW
81.0000 mg | CHEWABLE_TABLET | Freq: Two times a day (BID) | ORAL | Status: DC
  Administered 2020-02-03 – 2020-02-05 (×4): 81 mg via ORAL
  Filled 2020-02-03 (×4): qty 1

## 2020-02-03 MED ORDER — LEVOTHYROXINE SODIUM 75 MCG PO TABS
75.0000 ug | ORAL_TABLET | Freq: Every day | ORAL | Status: DC
  Administered 2020-02-04 – 2020-02-05 (×2): 75 ug via ORAL
  Filled 2020-02-03 (×2): qty 1

## 2020-02-03 MED ORDER — ONDANSETRON HCL 4 MG PO TABS: 4.0000 mg | ORAL_TABLET | Freq: Four times a day (QID) | ORAL | Status: DC | PRN

## 2020-02-03 MED ORDER — ACETAMINOPHEN 325 MG PO TABS
325.0000 mg | ORAL_TABLET | Freq: Four times a day (QID) | ORAL | Status: DC | PRN
  Administered 2020-02-05: 650 mg via ORAL
  Filled 2020-02-03: qty 2

## 2020-02-03 MED ORDER — METOCLOPRAMIDE HCL 5 MG/ML IJ SOLN: 5.0000 mg | Freq: Three times a day (TID) | INTRAMUSCULAR | Status: DC | PRN

## 2020-02-03 MED ORDER — ALUM & MAG HYDROXIDE-SIMETH 200-200-20 MG/5ML PO SUSP: 30.0000 mL | ORAL | Status: DC | PRN

## 2020-02-03 MED ORDER — SODIUM CHLORIDE 0.9 % IR SOLN
Status: DC | PRN
  Administered 2020-02-03: 1000 mL

## 2020-02-03 MED ORDER — ESTROGENS CONJUGATED 0.625 MG PO TABS
0.6250 mg | ORAL_TABLET | Freq: Every day | ORAL | Status: DC
  Administered 2020-02-03 – 2020-02-04 (×2): 0.625 mg via ORAL
  Filled 2020-02-03 (×3): qty 1

## 2020-02-03 MED ORDER — CHLORHEXIDINE GLUCONATE 4 % EX LIQD: 60.0000 mL | Freq: Once | CUTANEOUS | Status: DC

## 2020-02-03 MED ORDER — POVIDONE-IODINE 10 % EX SWAB
2.0000 "application " | Freq: Once | CUTANEOUS | Status: AC
  Administered 2020-02-03: 2 via TOPICAL

## 2020-02-03 MED ORDER — METHOCARBAMOL 500 MG PO TABS
500.0000 mg | ORAL_TABLET | Freq: Four times a day (QID) | ORAL | Status: DC | PRN
  Administered 2020-02-03 – 2020-02-05 (×7): 500 mg via ORAL
  Filled 2020-02-03 (×8): qty 1

## 2020-02-03 MED ORDER — DIPHENHYDRAMINE HCL 12.5 MG/5ML PO ELIX: 12.5000 mg | ORAL_SOLUTION | ORAL | Status: DC | PRN

## 2020-02-03 MED ORDER — CEFAZOLIN SODIUM-DEXTROSE 1-4 GM/50ML-% IV SOLN
1.0000 g | Freq: Four times a day (QID) | INTRAVENOUS | Status: AC
  Administered 2020-02-03 (×2): 1 g via INTRAVENOUS
  Filled 2020-02-03 (×2): qty 50

## 2020-02-03 MED ORDER — BUPIVACAINE IN DEXTROSE 0.75-8.25 % IT SOLN
INTRATHECAL | Status: DC | PRN
  Administered 2020-02-03: 1.8 mL via INTRATHECAL

## 2020-02-03 MED ORDER — SODIUM CHLORIDE 0.9 % IV SOLN: INTRAVENOUS | Status: DC

## 2020-02-03 MED ORDER — HYDROCODONE-ACETAMINOPHEN 5-325 MG PO TABS
1.0000 | ORAL_TABLET | ORAL | Status: DC | PRN
  Administered 2020-02-03: 15:00:00 1 via ORAL
  Filled 2020-02-03: qty 1

## 2020-02-03 MED ORDER — LACTATED RINGERS IV SOLN: INTRAVENOUS | Status: DC

## 2020-02-03 MED ORDER — OXYCODONE HCL 5 MG PO TABS
5.0000 mg | ORAL_TABLET | ORAL | Status: DC | PRN
  Administered 2020-02-03: 10 mg via ORAL
  Administered 2020-02-03: 5 mg via ORAL
  Administered 2020-02-04 – 2020-02-05 (×6): 10 mg via ORAL
  Filled 2020-02-03 (×5): qty 2
  Filled 2020-02-03: qty 1
  Filled 2020-02-03: qty 2

## 2020-02-03 MED ORDER — FENTANYL CITRATE (PF) 100 MCG/2ML IJ SOLN
INTRAMUSCULAR | Status: AC
  Filled 2020-02-03: qty 2

## 2020-02-03 MED ORDER — ONDANSETRON HCL 4 MG/2ML IJ SOLN
INTRAMUSCULAR | Status: DC | PRN
  Administered 2020-02-03: 4 mg via INTRAVENOUS

## 2020-02-03 MED ORDER — FENTANYL CITRATE (PF) 100 MCG/2ML IJ SOLN
INTRAMUSCULAR | Status: DC | PRN
  Administered 2020-02-03 (×2): 50 ug via INTRAVENOUS

## 2020-02-03 MED ORDER — PROPOFOL 10 MG/ML IV BOLUS
INTRAVENOUS | Status: DC | PRN
  Administered 2020-02-03: 20 mg via INTRAVENOUS

## 2020-02-03 MED ORDER — POLYETHYLENE GLYCOL 3350 17 G PO PACK: 17.0000 g | PACK | Freq: Every day | ORAL | Status: DC | PRN

## 2020-02-03 MED ORDER — ADULT MULTIVITAMIN W/MINERALS CH
1.0000 | ORAL_TABLET | Freq: Every day | ORAL | Status: DC
  Administered 2020-02-03 – 2020-02-04 (×2): 1 via ORAL
  Filled 2020-02-03 (×2): qty 1

## 2020-02-03 MED ORDER — METOCLOPRAMIDE HCL 5 MG PO TABS: 5.0000 mg | ORAL_TABLET | Freq: Three times a day (TID) | ORAL | Status: DC | PRN

## 2020-02-03 MED ORDER — MEDROXYPROGESTERONE ACETATE 2.5 MG PO TABS
2.5000 mg | ORAL_TABLET | Freq: Every day | ORAL | Status: DC
  Administered 2020-02-03 – 2020-02-04 (×2): 2.5 mg via ORAL
  Filled 2020-02-03 (×3): qty 1

## 2020-02-03 MED ORDER — CONJ ESTROG-MEDROXYPROGEST ACE 0.625-2.5 MG PO TABS: 1.0000 | ORAL_TABLET | Freq: Every day | ORAL | Status: DC

## 2020-02-03 MED ORDER — MORPHINE SULFATE (PF) 2 MG/ML IV SOLN: 0.5000 mg | INTRAVENOUS | Status: DC | PRN

## 2020-02-03 MED ORDER — PANTOPRAZOLE SODIUM 40 MG PO TBEC: 40.0000 mg | DELAYED_RELEASE_TABLET | Freq: Every day | ORAL | Status: DC

## 2020-02-03 MED ORDER — TRAMADOL HCL 50 MG PO TABS
50.0000 mg | ORAL_TABLET | Freq: Four times a day (QID) | ORAL | Status: DC
  Administered 2020-02-03 – 2020-02-04 (×3): 50 mg via ORAL
  Filled 2020-02-03 (×3): qty 1

## 2020-02-03 MED ORDER — HYDROCODONE-ACETAMINOPHEN 7.5-325 MG PO TABS: 1.0000 | ORAL_TABLET | ORAL | Status: DC | PRN

## 2020-02-03 MED ORDER — 0.9 % SODIUM CHLORIDE (POUR BTL) OPTIME
TOPICAL | Status: DC | PRN
  Administered 2020-02-03: 1000 mL

## 2020-02-03 MED ORDER — CALCIUM CARBONATE-VITAMIN D 500-200 MG-UNIT PO TABS
1.0000 | ORAL_TABLET | Freq: Every day | ORAL | Status: DC
  Administered 2020-02-03 – 2020-02-04 (×2): 1 via ORAL
  Filled 2020-02-03 (×2): qty 1

## 2020-02-03 MED ORDER — PANTOPRAZOLE SODIUM 40 MG PO TBEC
40.0000 mg | DELAYED_RELEASE_TABLET | Freq: Every day | ORAL | Status: DC
  Administered 2020-02-03 – 2020-02-04 (×2): 40 mg via ORAL
  Filled 2020-02-03 (×2): qty 1

## 2020-02-03 MED ORDER — PROPOFOL 500 MG/50ML IV EMUL
INTRAVENOUS | Status: DC | PRN
  Administered 2020-02-03: 75 ug/kg/min via INTRAVENOUS

## 2020-02-03 MED ORDER — ONDANSETRON HCL 4 MG/2ML IJ SOLN
4.0000 mg | Freq: Four times a day (QID) | INTRAMUSCULAR | Status: DC | PRN
  Administered 2020-02-03: 4 mg via INTRAVENOUS
  Filled 2020-02-03: qty 2

## 2020-02-03 MED ORDER — PHENYLEPHRINE HCL-NACL 10-0.9 MG/250ML-% IV SOLN
INTRAVENOUS | Status: DC | PRN
  Administered 2020-02-03: 25 ug/min via INTRAVENOUS

## 2020-02-03 MED ORDER — CEFAZOLIN SODIUM-DEXTROSE 2-4 GM/100ML-% IV SOLN
2.0000 g | INTRAVENOUS | Status: AC
  Administered 2020-02-03: 2 g via INTRAVENOUS
  Filled 2020-02-03: qty 100

## 2020-02-03 SURGICAL SUPPLY — 42 items
BAG ZIPLOCK 12X15 (MISCELLANEOUS) IMPLANT
BENZOIN TINCTURE PRP APPL 2/3 (GAUZE/BANDAGES/DRESSINGS) IMPLANT
BLADE SAW SGTL 18X1.27X75 (BLADE) ×2 IMPLANT
BLADE SAW SGTL 18X1.27X75MM (BLADE) ×1
CLOSURE WOUND 1/2 X4 (GAUZE/BANDAGES/DRESSINGS)
COVER PERINEAL POST (MISCELLANEOUS) ×3 IMPLANT
COVER SURGICAL LIGHT HANDLE (MISCELLANEOUS) ×3 IMPLANT
COVER WAND RF STERILE (DRAPES) ×3 IMPLANT
DRAPE STERI IOBAN 125X83 (DRAPES) ×3 IMPLANT
DRAPE U-SHAPE 47X51 STRL (DRAPES) ×6 IMPLANT
DRSG AQUACEL AG ADV 3.5X10 (GAUZE/BANDAGES/DRESSINGS) ×3 IMPLANT
DURAPREP 26ML APPLICATOR (WOUND CARE) ×3 IMPLANT
ELECT REM PT RETURN 15FT ADLT (MISCELLANEOUS) ×3 IMPLANT
GAUZE XEROFORM 1X8 LF (GAUZE/BANDAGES/DRESSINGS) ×3 IMPLANT
GLOVE BIO SURGEON STRL SZ7.5 (GLOVE) ×3 IMPLANT
GLOVE BIOGEL PI IND STRL 8 (GLOVE) ×2 IMPLANT
GLOVE BIOGEL PI INDICATOR 8 (GLOVE) ×4
GLOVE ECLIPSE 8.0 STRL XLNG CF (GLOVE) ×3 IMPLANT
GOWN STRL REUS W/TWL XL LVL3 (GOWN DISPOSABLE) ×6 IMPLANT
HANDPIECE INTERPULSE COAX TIP (DISPOSABLE) ×3
HEAD M SROM 36MM PLUS 1.5 (Hips) IMPLANT
HOLDER FOLEY CATH W/STRAP (MISCELLANEOUS) ×3 IMPLANT
KIT TURNOVER KIT A (KITS) IMPLANT
LINER ACETAB NEUTRAL 36ID 520D (Liner) ×2 IMPLANT
PACK ANTERIOR HIP CUSTOM (KITS) ×3 IMPLANT
PENCIL SMOKE EVACUATOR (MISCELLANEOUS) ×2 IMPLANT
PIN SECTOR W/GRIP ACE CUP 52MM (Hips) ×2 IMPLANT
SET HNDPC FAN SPRY TIP SCT (DISPOSABLE) ×1 IMPLANT
SROM M HEAD 36MM PLUS 1.5 (Hips) ×3 IMPLANT
STAPLER VISISTAT 35W (STAPLE) ×2 IMPLANT
STEM CORAIL KA11 (Stem) ×2 IMPLANT
STRIP CLOSURE SKIN 1/2X4 (GAUZE/BANDAGES/DRESSINGS) IMPLANT
SUT ETHIBOND NAB CT1 #1 30IN (SUTURE) ×3 IMPLANT
SUT ETHILON 2 0 PS N (SUTURE) IMPLANT
SUT MNCRL AB 4-0 PS2 18 (SUTURE) IMPLANT
SUT VIC AB 0 CT1 36 (SUTURE) ×3 IMPLANT
SUT VIC AB 1 CT1 36 (SUTURE) ×3 IMPLANT
SUT VIC AB 2-0 CT1 27 (SUTURE) ×6
SUT VIC AB 2-0 CT1 TAPERPNT 27 (SUTURE) ×2 IMPLANT
TRAY FOLEY MTR SLVR 14FR STAT (SET/KITS/TRAYS/PACK) ×2 IMPLANT
TRAY FOLEY MTR SLVR 16FR STAT (SET/KITS/TRAYS/PACK) IMPLANT
YANKAUER SUCT BULB TIP 10FT TU (MISCELLANEOUS) ×3 IMPLANT

## 2020-02-03 NOTE — Transfer of Care (Signed)
Immediate Anesthesia Transfer of Care Note  Patient: Brenda Ortiz  Procedure(s) Performed: LEFT TOTAL HIP ARTHROPLASTY ANTERIOR APPROACH (Left Hip)  Patient Location: PACU  Anesthesia Type:Spinal  Level of Consciousness: awake, alert  and oriented  Airway & Oxygen Therapy: Patient Spontanous Breathing and Patient connected to face mask oxygen  Post-op Assessment: Report given to RN and Post -op Vital signs reviewed and stable  Post vital signs: Reviewed and stable  Last Vitals:  Vitals Value Taken Time  BP 119/60 02/03/20 1109  Temp    Pulse 71 02/03/20 1110  Resp 21 02/03/20 1110  SpO2 100 % 02/03/20 1110  Vitals shown include unvalidated device data.  Last Pain:  Vitals:   02/03/20 0800  TempSrc:   PainSc: 0-No pain         Complications: No apparent anesthesia complications

## 2020-02-03 NOTE — Progress Notes (Signed)
Physical Therapy Evaluation Patient Details Name: Brenda Ortiz MRN: [id number] DOB: [birth year] Today's Date: 02/03/2020   02/03/20 1500  PT Visit Information  Last PT Received On 02/03/20  Assistance Needed +1  History of Present Illness Patient is 79 y.o. female s/p Lt THA anterior approach on 02/03/20 with PMH significant for hyothyroidism, OA.  Precautions  Precautions Fall  Restrictions  Weight Bearing Restrictions No  Home Living  Family/patient expects to be discharged to: Private residence  Living Arrangements Spouse/significant other  Available Help at Discharge Family  Type of [place] Access Level entry;Stairs to enter  Entrance Stairs-Number of Steps 2  Entrance Stairs-Rails None  Home Layout Multi-level  Alternate Level Stairs-Number of Steps 14  Alternate Level Stairs-Rails Left  Bathroom Shower/Tub Walk-in shower  Bathroom Toilet Handicapped height  Bathroom Accessibility Yes  Home Equipment Walker - 4 wheels;BSC;Shower seat  Additional Comments pt has 3 level home, level entry through the back to basement level, pt plans to stay down here in bedroom and has walkin shower on this level  Prior Function  Level of Independence Independent  Communication  Communication No difficulties  Pain Assessment  Pain Assessment 0-10  Pain Score 7  Pain Location Lt hip  Pain Descriptors / Indicators Aching;Burning;Sore  Pain Intervention(s) Limited activity within patient's tolerance;Monitored during session;Repositioned  Cognition  Arousal/Alertness Awake/alert  Behavior During Therapy WFL for tasks assessed/performed  Overall Cognitive Status Within Functional Limits for tasks assessed  Upper Extremity Assessment  Upper Extremity Assessment Overall WFL for tasks assessed  Lower Extremity Assessment  Lower Extremity Assessment Generalized weakness;LLE deficits/detail  LLE Unable to fully assess due to immobilization  LLE Sensation WNL  LLE Coordination  WNL  Cervical / Trunk Assessment  Cervical / Trunk Assessment Normal  Bed Mobility  Overal bed mobility Needs Assistance  Bed Mobility Supine to Sit  Supine to sit Min assist;HOB elevated  General bed mobility comments verbal cues to use bed rail, assist to bring Lt LE to EOB, pt limited by pain  Transfers  Overall transfer level Needs assistance  Equipment used Rolling walker (2 wheeled)  Transfers Sit to/from [employer] Transfers  Sit to Stand Min assist;From elevated surface  Stand pivot transfers Min assist  General transfer comment cues for hand placement/technique with RW, assist required to complete power up and steady in standing. pt required verbal cues for step sequencing, pt limited by pain and greater difficulty advancing Lt LE vs. weight bearing on Lt LE. Pt able to take small steps in RW with assist to position to turn to recliner.  Balance  Overall balance assessment Needs assistance  Sitting-balance support Feet supported  Sitting balance-Leahy Scale Good  Standing balance support During functional activity;Bilateral upper extremity supported  Standing balance-Leahy Scale Poor  Exercises  Exercises Total Joint  Total Joint Exercises  Ankle Circles/Pumps AROM;Both;10 reps;Seated  PT - End of Session  Equipment Utilized During Treatment Gait belt  Activity Tolerance Patient limited by pain  Patient left in chair;with call bell/phone within reach;with chair alarm set;with family/visitor present  Nurse Communication Mobility status  PT Assessment  PT Recommendation/Assessment Patient needs continued PT services  PT Visit Diagnosis Muscle weakness (generalized) (M62.81);Difficulty in walking, not elsewhere classified (R26.2);Pain  Pain - Right/Left Left  Pain - part of body Hip  PT Problem List Decreased strength;Decreased range of motion;Decreased activity tolerance;Decreased balance;Decreased mobility;Decreased knowledge of use of DME;Pain  PT Plan  PT  Frequency (ACUTE ONLY) 7X/week  PT Treatment/Interventions (ACUTE  ONLY) DME instruction;Gait training;Stair training;Functional mobility training;Therapeutic activities;Therapeutic exercise;Balance training;Patient/family education  AM-PAC PT "6 Clicks" Mobility Outcome Measure (Version 2)  Help needed turning from your back to your side while in a flat bed without using bedrails? 3  Help needed moving from lying on your back to sitting on the side of a flat bed without using bedrails? 3  Help needed moving to and from a bed to a chair (including a wheelchair)? 3  Help needed standing up from a chair using your arms (e.g., wheelchair or bedside chair)? 3  Help needed to walk in hospital room? 2  Help needed climbing 3-5 steps with a railing?  2  6 Click Score 16  Consider Recommendation of Discharge To: Home with [facility]  PT Recommendation  Follow Up Recommendations Follow surgeon's recommendation for DC plan and follow-up therapies;Home health PT  PT equipment Rolling walker with 5" wheels  Individuals Consulted  Consulted and Agree with Results and Recommendations Patient  Acute Rehab PT Goals  Patient Stated Goal to stop hurting  PT Goal Formulation With patient  Time For Goal Achievement 02/10/20  Potential to Achieve Goals Good  PT Time Calculation  PT Start Time (ACUTE ONLY) 1553  PT Stop Time (ACUTE ONLY) 1630  PT Time Calculation (min) (ACUTE ONLY) 37 min  PT General Charges  $$ ACUTE PT VISIT 1 Visit  PT Evaluation  $PT Eval Low Complexity 1 Low  PT Treatments  $Therapeutic Activity 8-22 mins  Written Expression  Dominant Hand Right    Wynn Maudlin, DPT Physical Therapist with [facility] [phone]  02/03/2020 7:23 PM

## 2020-02-03 NOTE — H&P (Signed)
The patient understands fully that we are proceeding with a left total hip arthroplasty to treat the pain of her severe osteoarthritis.  See current H&P.  There has been no acute changes in her medical status.  We had a long and thorough discussion about the risk and benefits of surgery.  The left hip has been marked and informed consent is obtained.

## 2020-02-03 NOTE — Brief Op Note (Signed)
02/03/2020  10:52 AM  PATIENT:  Brenda Ortiz  79 y.o. female  PRE-OPERATIVE DIAGNOSIS:  Left hip osteoarthritis  POST-OPERATIVE DIAGNOSIS:  Left hip osteoarthritis  PROCEDURE:  Procedure(s): LEFT TOTAL HIP ARTHROPLASTY ANTERIOR APPROACH (Left)  SURGEON:  Surgeon(s) and Role:    Kathryne Hitch, MD - Primary  PHYSICIAN ASSISTANT:  Rexene Edison, PA-C  ANESTHESIA:   spinal  EBL:  150 mL   COUNTS:  YES  DICTATION: .Other Dictation: Dictation Number [id number]  PLAN OF CARE: Admit for overnight observation  PATIENT DISPOSITION:  PACU - hemodynamically stable.   Delay start of Pharmacological VTE agent (>24hrs) due to surgical blood loss or risk of bleeding: no

## 2020-02-03 NOTE — Anesthesia Preprocedure Evaluation (Signed)
Anesthesia Evaluation  Patient identified by MRN, date of birth, ID band Patient awake    Reviewed: Allergy & Precautions, NPO status , Patient's Chart, lab work & pertinent test results  History of Anesthesia Complications Negative for: history of anesthetic complications  Airway Mallampati: II  TM Distance: >3 FB Neck ROM: Full    Dental  (+) Teeth Intact   Pulmonary neg pulmonary ROS, former smoker,    Pulmonary exam normal        Cardiovascular negative cardio ROS Normal cardiovascular exam     Neuro/Psych negative neurological ROS  negative psych ROS   GI/Hepatic negative GI ROS, Neg liver ROS,   Endo/Other  Hypothyroidism   Renal/GU negative Renal ROS  negative genitourinary   Musculoskeletal  (+) Arthritis , Osteoarthritis,    Abdominal   Peds  Hematology negative hematology ROS (+)   Anesthesia Other Findings   Reproductive/Obstetrics                             Anesthesia Physical Anesthesia Plan  ASA: II  Anesthesia Plan: Spinal   Post-op Pain Management:    Induction:   PONV Risk Score and Plan: 2 and Propofol infusion, Treatment may vary due to age or medical condition, Ondansetron and TIVA  Airway Management Planned: Nasal Cannula and Simple Face Mask  Additional Equipment: None  Intra-op Plan:   Post-operative Plan:   Informed Consent: I have reviewed the patients History and Physical, chart, labs and discussed the procedure including the risks, benefits and alternatives for the proposed anesthesia with the patient or authorized representative who has indicated his/her understanding and acceptance.       Plan Discussed with:   Anesthesia Plan Comments:         Anesthesia Quick Evaluation

## 2020-02-03 NOTE — Anesthesia Procedure Notes (Signed)
Spinal  Patient location during procedure: OR End time: 02/03/2020 9:50 AM Staffing Performed: resident/CRNA  Anesthesiologist: Lidia Collum, MD Resident/CRNA: Maxwell Caul, CRNA Preanesthetic Checklist Completed: patient identified, IV checked, site marked, risks and benefits discussed, surgical consent, monitors and equipment checked, pre-op evaluation and timeout performed Spinal Block Patient position: sitting Prep: DuraPrep Patient monitoring: heart rate, cardiac monitor, continuous pulse ox and blood pressure Approach: midline Location: L3-4 Injection technique: single-shot Needle Needle type: Pencan  Needle gauge: 24 G Needle length: 10 cm Assessment Sensory level: T4 Additional Notes IV functioning, monitors applied to pt. Expiration date of kit checked and confirmed to be in date. Sterile prep and drape, hand hygiene and sterile gloved used. Pt was positioned and spine was prepped in sterile fashion. Skin was anesthetized with lidocaine. Free flow of clear CSF obtained prior to injecting local anesthetic into CSF. Spinal needle aspirated freely following injection. Needle was carefully withdrawn, and pt tolerated procedure well. Loss of motor and sensory on exam post injection.

## 2020-02-03 NOTE — Op Note (Signed)
NAME: Brenda Ortiz, Brenda Ortiz MEDICAL RECORD [id number] ACCOUNT [id number] DATE OF BIRTH:Feb 19, 1941 FACILITY: [facility] LOCATION: [facility] PHYSICIAN:Kathryn Cosby Aretha Parrot, MD  OPERATIVE REPORT  DATE OF PROCEDURE:  02/03/2020  PREOPERATIVE DIAGNOSIS:  Primary osteoarthritis and degenerative joint disease of left hip.  POSTOPERATIVE DIAGNOSIS:  Primary osteoarthritis and degenerative joint disease of left hip.  PROCEDURE:  Left total hip arthroplasty through direct anterior approach.  IMPLANTS:  DePuy Sector Gription acetabular component size 52, size 32+0 neutral polyethylene liner, size 11 Corail femoral component with standard offset, size 36+1.5 metal hip ball.  SURGEON:  Vanita Panda.  Magnus Ivan, MD  ASSISTANT:  Richardean Canal, PA-C  ANESTHESIA:  Spinal.  ANTIBIOTICS:  Two g IV Ancef.  ESTIMATED BLOOD LOSS:  150 mL.  COMPLICATIONS:  None.  INDICATIONS:  The patient is a very pleasant 79 year old patient well known to me.  We have been seeing her for a while due to severe arthritis involving her left hip.  She is an avid walker and it has gotten to where this is really affecting her  exercise routine in terms of arthritis and pain in her left hip.  She actually has a leg length discrepancy as well due to the arthritis.  Her left leg is slightly shorter than the right.  She has tried all forms of conservative treatment, including an  intraarticular steroid injection.  At this point, her pain has become daily and is detrimentally affecting her mobility, her quality of life and her activities of daily living to the point she does wish to proceed with a total hip arthroplasty.  We  talked about the risk of acute blood loss anemia, nerve or vessel injury, fracture, infection, dislocation, DVT and implant failure.  We talked about our goals being decreased pain, improved mobility and overall improved quality of life.  DESCRIPTION OF PROCEDURE:  After informed consent was obtained and  appropriate left hip was marked, she was brought to the operating room and sat up on a stretcher.  Spinal anesthesia was obtained.  She was then laid in the supine position on a  stretcher.  A Foley catheter was placed.  I assessed her leg length again and found her to be a little shorter on the left than the right.  Traction boots were then placed on both her feet.  I placed her supine on the Hana fracture table, with the  perineal post in place and both legs in line skeletal traction device and no traction applied.  Her left operative hip was prepped and draped with DuraPrep and sterile drapes.  A timeout was called.  She was identified as correct patient, correct left  hip.  I then made an incision just inferior and posterior to the anterior superior spine and carried this obliquely down the leg.  We dissected down to the tensor fascia lata muscle.  Tensor fascia was then divided longitudinally to proceed with direct  anterior approach to the hip.  We identified and cauterized circumflex vessels and identified the hip capsule, opened up the hip capsule in an L-type format, finding a moderate joint effusion and significant periarticular osteophytes around the femoral  head and neck.  Within the joint capsule, I placed Cobra retractors around the medial and lateral femoral neck and then made our femoral neck cut with an oscillating saw just proximal to the lesser trochanter and completed this with an osteotome.  We  placed a corkscrew guide in the femoral head and removed the femoral head in its entirety.  I then  placed a bent Hohmann over the medial acetabular rim and removed remnants of the acetabulum and the labral debris.  I then began reaming under direct  visualization from a size 44 reamer in stepwise increments, going up to a size 51, with all reamers under direct visualization, the last reamer placed under direct fluoroscopy so I could obtain my depth of reaming, my inclination and anteversion.  I  then  placed the real DePuy Sector Gription acetabular component size 52 and we went with a 36+0 neutral polyethylene liner.  Attention was then turned to the femur.  With the leg externally rotated to 120 degrees, extended and adducted, we were able to place  a Mueller retractor medially and a Hohmann retractor above the greater trochanter.  I released lateral joint capsule and used a box-cutting osteotome to enter the femoral canal and a rongeur to lateralize.  We then began broaching using the Corail  broaching system from a size 8 going up to a size 11.  With a size 11 in place, we trialed a standard offset femoral neck and a 32+1.5 trial hip ball.  I brought the leg back over and up and with traction and internal rotation, reducing the pelvis, we  were pleased with leg length, offset, range of motion and stability assessed radiographically and clinically.  I then dislocated the hip and removed the trial components.  We placed the real Corail femoral component with standard offset size 11 and the  real 36+1.5 metal hip ball and again reduced this in the acetabulum and it was stable.  We then irrigated the soft tissue with normal saline solution using pulse lavage.  We closed the joint capsule with interrupted #1 Ethibond suture, followed by  closing the tensor fascia with #1 Vicryl.  Zero Vicryl was used to close deep tissue, 2-0 Vicryl was used to close subcutaneous tissue and we reapproximated the skin with interrupted staples.  Xeroform and Aquacel dressing was applied.  She was taken off  the Hana table and taken to the recovery room in stable condition.  All final counts were correct.  There were no complications noted.  Of note Benita Stabile, PA-C, assisted during the entire case.  His assistance was crucial for facilitating all aspects of  this case.  VN/NUANCE  D:02/03/2020 T:02/03/2020 JOB:[id number]

## 2020-02-03 NOTE — Anesthesia Procedure Notes (Signed)
Procedure Name: MAC Date/Time: 02/03/2020 9:42 AM Performed by: Maxwell Caul, CRNA Pre-anesthesia Checklist: Patient identified, Emergency Drugs available, Suction available and Patient being monitored Oxygen Delivery Method: Simple face mask

## 2020-02-03 NOTE — Anesthesia Postprocedure Evaluation (Signed)
Anesthesia Post Note  Patient: ROBYNN MARCEL  Procedure(s) Performed: LEFT TOTAL HIP ARTHROPLASTY ANTERIOR APPROACH (Left Hip)     Patient location during evaluation: PACU Anesthesia Type: Spinal Level of consciousness: oriented and awake and alert Pain management: pain level controlled Vital Signs Assessment: post-procedure vital signs reviewed and stable Respiratory status: spontaneous breathing, respiratory function stable and nonlabored ventilation Cardiovascular status: blood pressure returned to baseline and stable Postop Assessment: no headache, no backache, no apparent nausea or vomiting and spinal receding Anesthetic complications: no    Last Vitals:  Vitals:   02/03/20 1446 02/03/20 1556  BP: 112/88 130/68  Pulse: 63 67  Resp: 16 16  Temp: 36.7 C 36.6 C  SpO2: 100% 99%    Last Pain:  Vitals:   02/03/20 1634  TempSrc:   PainSc: 5                  Lucretia Kern

## 2020-02-04 DIAGNOSIS — M1612 Unilateral primary osteoarthritis, left hip: Secondary | ICD-10-CM | POA: Diagnosis not present

## 2020-02-04 LAB — CBC
HCT: 38.3 % (ref 36.0–46.0)
Hemoglobin: 12.3 g/dL (ref 12.0–15.0)
MCH: 32.2 pg (ref 26.0–34.0)
MCHC: 32.1 g/dL (ref 30.0–36.0)
MCV: 100.3 fL — ABNORMAL HIGH (ref 80.0–100.0)
Platelets: 193 10*3/uL (ref 150–400)
RBC: 3.82 MIL/uL — ABNORMAL LOW (ref 3.87–5.11)
RDW: 13.2 % (ref 11.5–15.5)
WBC: 7.1 10*3/uL (ref 4.0–10.5)
nRBC: 0 % (ref 0.0–0.2)

## 2020-02-04 LAB — BASIC METABOLIC PANEL
Anion gap: 7 (ref 5–15)
BUN: 10 mg/dL (ref 8–23)
CO2: 28 mmol/L (ref 22–32)
Calcium: 8.2 mg/dL — ABNORMAL LOW (ref 8.9–10.3)
Chloride: 103 mmol/L (ref 98–111)
Creatinine, Ser: 0.7 mg/dL (ref 0.44–1.00)
GFR calc Af Amer: >60 mL/min (ref >60)
GFR calc non Af Amer: >60 mL/min (ref >60)
Glucose, Bld: 117 mg/dL — ABNORMAL HIGH (ref 70–99)
Potassium: 4.2 mmol/L (ref 3.5–5.1)
Sodium: 138 mmol/L (ref 135–145)

## 2020-02-04 MED ORDER — ASPIRIN 81 MG PO CHEW: 81.0000 mg | CHEWABLE_TABLET | Freq: Two times a day (BID) | ORAL | 0 refills | Status: DC

## 2020-02-04 MED ORDER — METHOCARBAMOL 500 MG PO TABS: 500.0000 mg | ORAL_TABLET | Freq: Four times a day (QID) | ORAL | 1 refills | Status: DC | PRN

## 2020-02-04 MED ORDER — OXYCODONE HCL 5 MG PO TABS: 5.0000 mg | ORAL_TABLET | ORAL | 0 refills | Status: DC | PRN

## 2020-02-04 NOTE — Progress Notes (Signed)
Subjective: 1 Day Post-Op Procedure(s) (LRB): LEFT TOTAL HIP ARTHROPLASTY ANTERIOR APPROACH (Left) Patient reports pain as moderate.    Objective: Vital signs in last 24 hours: Temp:  [96.8 F (36 C)-98.7 F (37.1 C)] 98 F (36.7 C) (03/20 0447) Pulse Rate:  [58-74] 70 (03/20 0447) Resp:  [8-22] 18 (03/20 0447) BP: (104-138)/(51-88) 131/66 (03/20 0447) SpO2:  [99 %-100 %] 99 % (03/20 0447)  Intake/Output from previous day: 03/19 0701 - 03/20 0700 In: 3671.1 [P.O.:965; I.V.:2456.1; IV Piggyback:250] Out: 1875 [Urine:1725; Blood:150] Intake/Output this shift: Total I/O In: 120 [P.O.:120] Out: -   Recent Labs    02/04/20 0234  HGB 12.3   Recent Labs    02/04/20 0234  WBC 7.1  RBC 3.82*  HCT 38.3  PLT 193   Recent Labs    02/04/20 0234  NA 138  K 4.2  CL 103  CO2 28  BUN 10  CREATININE 0.70  GLUCOSE 117*  CALCIUM 8.2*   No results for input(s): LABPT, INR in the last 72 hours.  Sensation intact distally Intact pulses distally Dorsiflexion/Plantar flexion intact Incision: dressing C/D/I   Assessment/Plan: 1 Day Post-Op Procedure(s) (LRB): LEFT TOTAL HIP ARTHROPLASTY ANTERIOR APPROACH (Left) Up with therapy Discharge home with home health this afternoon if clears therapy.    Patient's anticipated LOS is less than 2 midnights, meeting these requirements: - Younger than 62 - Lives within 1 hour of care - Has a competent adult at home to recover with post-op recover - NO history of  - Chronic pain requiring opiods  - Diabetes  - Coronary Artery Disease  - Heart failure  - Heart attack  - Stroke  - DVT/VTE  - Cardiac arrhythmia  - Respiratory Failure/COPD  - Renal failure  - Anemia  - Advanced Liver disease       Kathryne Hitch 02/04/2020, 10:17 AM

## 2020-02-04 NOTE — Discharge Summary (Signed)
Patient ID: Brenda Ortiz MRN: [id number] DOB/AGE: 77-Aug-1942 77 y.o.  Admit date: 02/03/2020 Discharge date: 02/04/2020  Admission Diagnoses:  Principal Problem:   Unilateral primary osteoarthritis, left hip Active Problems:   Status post total replacement of left hip   Discharge Diagnoses:  Same  Past Medical History:  Diagnosis Date  . Arthritis   . Hypothyroidism   . Incontinence   . Seasonal allergies     Surgeries: Procedure(s): LEFT TOTAL HIP ARTHROPLASTY ANTERIOR APPROACH on 02/03/2020   Consultants:   Discharged Condition: Improved  Hospital Course: Brenda Ortiz is an 79 y.o. female who was admitted 02/03/2020 for operative treatment ofUnilateral primary osteoarthritis, left hip. Patient has severe unremitting pain that affects sleep, daily activities, and work/hobbies. After pre-op clearance the patient was taken to the operating room on 02/03/2020 and underwent  Procedure(s): LEFT TOTAL HIP ARTHROPLASTY ANTERIOR APPROACH.    Patient was given perioperative antibiotics:  Anti-infectives (From admission, onward)   Start     Dose/Rate Route Frequency Ordered Stop   02/03/20 1600  ceFAZolin (ANCEF) IVPB 1 g/50 mL premix     1 g 100 mL/hr over 30 Minutes Intravenous Every 6 hours 02/03/20 1408 02/03/20 2230   02/03/20 0745  ceFAZolin (ANCEF) IVPB 2g/100 mL premix     2 g 200 mL/hr over 30 Minutes Intravenous On call to O.R. 02/03/20 0736 02/03/20 1001       Patient was given sequential compression devices, early ambulation, and chemoprophylaxis to prevent DVT.  Patient benefited maximally from hospital stay and there were no complications.    Recent vital signs:  Patient Vitals for the past 24 hrs:  BP Temp Temp src Pulse Resp SpO2  02/04/20 0447 131/66 98 F (36.7 C) Oral 70 18 99 %  02/04/20 0115 129/61 98.7 F (37.1 C) - 74 16 100 %  02/03/20 2200 138/68 97.8 F (36.6 C) Oral 72 16 99 %  02/03/20 1701 137/66 98.2 F (36.8 C) - 71 16 99 %   02/03/20 1556 130/68 97.8 F (36.6 C) Oral 67 16 99 %  02/03/20 1446 112/88 98.1 F (36.7 C) - 63 16 100 %  02/03/20 1345 133/60 (!) 97.5 F (36.4 C) Oral 70 20 100 %  02/03/20 1245 111/69 (!) 96.8 F (36 C) - 65 20 100 %  02/03/20 1230 104/61 - - 68 (!) 8 100 %  02/03/20 1215 (!) 119/51 - - (!) 58 19 100 %  02/03/20 1200 (!) 107/55 - - 60 13 100 %  02/03/20 1145 (!) 105/56 - - 63 11 100 %  02/03/20 1130 (!) 112/55 - - 69 12 100 %  02/03/20 1115 (!) 112/53 - - 71 (!) 22 100 %  02/03/20 1109 119/60 (!) 97.4 F (36.3 C) - 71 18 100 %     Recent laboratory studies:  Recent Labs    02/04/20 0234  WBC 7.1  HGB 12.3  HCT 38.3  PLT 193  NA 138  K 4.2  CL 103  CO2 28  BUN 10  CREATININE 0.70  GLUCOSE 117*  CALCIUM 8.2*     Discharge Medications:   Allergies as of 02/04/2020   No Known Allergies     Medication List    TAKE these medications   acetaminophen 500 MG tablet Commonly known as: TYLENOL Take 500 mg by mouth every 6 (six) hours as needed (for pain.).   aspirin 81 MG chewable tablet Chew 1 tablet (81 mg total) by mouth 2 (two)  times daily.   calcium-vitamin D 250-125 MG-UNIT tablet Commonly known as: OSCAL Take 1 tablet by mouth at bedtime.   fexofenadine 180 MG tablet Commonly known as: ALLEGRA Take 180 mg by mouth daily as needed for allergies or rhinitis.   methocarbamol 500 MG tablet Commonly known as: ROBAXIN Take 1 tablet (500 mg total) by mouth every 6 (six) hours as needed for muscle spasms.   multivitamin with minerals Tabs tablet Take 1 tablet by mouth at bedtime.   naproxen sodium 220 MG tablet Commonly known as: ALEVE Take 220 mg by mouth daily as needed (pain.).   oxyCODONE 5 MG immediate release tablet Commonly known as: Oxy IR/ROXICODONE Take 1 tablet (5 mg total) by mouth every 4 (four) hours as needed for moderate pain.   Prempro 0.625-2.5 MG tablet Generic drug: estrogen (conjugated)-medroxyprogesterone Take 1 tablet by  mouth at bedtime.   Synthroid 75 MCG tablet Generic drug: levothyroxine Take 75 mcg by mouth daily before breakfast.            Durable Medical Equipment  (From admission, onward)         Start     Ordered   02/03/20 1409  DME 3 n 1  Once     02/03/20 1408   02/03/20 1409  DME Walker rolling  Once    Question Answer Comment  Walker: With 5 Inch Wheels   Patient needs a walker to treat with the following condition Status post total replacement of left hip      02/03/20 1408          Diagnostic Studies: DG Pelvis Portable  Result Date: 02/03/2020 CLINICAL DATA:  Left hip arthroplasty EXAM: PORTABLE PELVIS 1-2 VIEWS COMPARISON:  12/01/2019 FINDINGS: Single AP view of the pelvis demonstrates interval postsurgical changes from left total hip arthroplasty. Arthroplasty components appear in their expected alignment without periprosthetic fracture. Expected postsurgical changes within the soft tissues overlying the left hip. IMPRESSION: Interval postsurgical changes from left total hip arthroplasty. Electronically Signed   By: Davina Poke D.O.   On: 02/03/2020 11:40   DG C-Arm 1-60 Min-No Report  Result Date: 02/03/2020 CLINICAL DATA:  Osteoarthritis of the left hip. Status post left total hip arthroplasty. EXAM: OPERATIVE LEFT HIP (WITH PELVIS IF PERFORMED) 1 VIEW, dg C-arm 1-60 MIN TECHNIQUE: Fluoroscopic spot image(s) were submitted for interpretation post-operatively. COMPARISON:  RADIOGRAPHS DATED 12/01/2019 FINDINGS: AP C-arm images demonstrate that the acetabular and femoral components of the total hip prosthesis are in excellent position. No fracture or dislocation. IMPRESSION: Satisfactory appearance of the left hip after total hip prosthesis insertion. FLUOROSCOPY TIME:  16.5 seconds.  1.97 mGy. C-arm fluoroscopic images were obtained intraoperatively and submitted for post operative interpretation. Electronically Signed   By: Lorriane Shire M.D.   On: 02/03/2020 11:08    DG HIP OPERATIVE UNILAT W OR W/O PELVIS LEFT  Result Date: 02/03/2020 CLINICAL DATA:  Osteoarthritis of the left hip. Status post left total hip arthroplasty. EXAM: OPERATIVE LEFT HIP (WITH PELVIS IF PERFORMED) 1 VIEW, dg C-arm 1-60 MIN TECHNIQUE: Fluoroscopic spot image(s) were submitted for interpretation post-operatively. COMPARISON:  RADIOGRAPHS DATED 12/01/2019 FINDINGS: AP C-arm images demonstrate that the acetabular and femoral components of the total hip prosthesis are in excellent position. No fracture or dislocation. IMPRESSION: Satisfactory appearance of the left hip after total hip prosthesis insertion. FLUOROSCOPY TIME:  16.5 seconds.  1.97 mGy. C-arm fluoroscopic images were obtained intraoperatively and submitted for post operative interpretation. Electronically Signed   By: Jeneen Rinks  Maxwell M.D.   On: 02/03/2020 11:08    Disposition: Discharge disposition: 01-Home or Self Care         Follow-up Information    Kathryne Hitch, MD Follow up in 2 week(s).   Specialty: Orthopedic Surgery Contact information: [address] [place] [address] [phone]            Signed: Kathryne Hitch 02/04/2020, 10:18 AM

## 2020-02-04 NOTE — Plan of Care (Signed)
Progressing well today

## 2020-02-04 NOTE — Progress Notes (Signed)
Physical Therapy Treatment Patient Details Name: Brenda Ortiz MRN: [id number] DOB: 01-02-1941 Today's Date: 02/04/2020    History of Present Illness Patient is 79 y.o. female s/p Lt THA anterior approach on 02/03/20 with PMH significant for hyothyroidism, OA.    PT Comments    Pt assisted with ambulating to and from bathroom.  Pt reports pain and mild dizziness with mobility.  Pt requires increased time for movement and ambulating.  Pt assisted to recliner end of session and reported nausea so provided cool washclothes.  Will return for afternoon session however do not anticipate d/c home today.   Follow Up Recommendations  Follow surgeon's recommendation for DC plan and follow-up therapies;Home health PT     Equipment Recommendations  Rolling walker with 5" wheels    Recommendations for Other Services       Precautions / Restrictions Precautions Precautions: Fall Restrictions Weight Bearing Restrictions: No    Mobility  Bed Mobility Overal bed mobility: Needs Assistance Bed Mobility: Supine to Sit     Supine to sit: Min guard     General bed mobility comments: verbal cues for self assist, utilized bed rail  Transfers Overall transfer level: Needs assistance Equipment used: Rolling walker (2 wheeled) Transfers: Sit to/from Stand Sit to Stand: Min assist         General transfer comment: verbal cues for UE and LE positioning and weight shifting; assist to rise and steady  Ambulation/Gait Ambulation/Gait assistance: Min assist Gait Distance (Feet): 16 Feet Assistive device: Rolling walker (2 wheeled) Gait Pattern/deviations: Step-to pattern;Decreased stance time - left;Antalgic Gait velocity: decreased   General Gait Details: verbal cues for sequence, RW positioning, step length; pt requires significantly increased time   Stairs             Wheelchair Mobility    Modified Rankin (Stroke Patients Only)       Balance                                             Cognition Arousal/Alertness: Awake/alert Behavior During Therapy: WFL for tasks assessed/performed Overall Cognitive Status: Within Functional Limits for tasks assessed                                        Exercises      General Comments        Pertinent Vitals/Pain Pain Assessment: 0-10 Pain Score: 8  Pain Location: Lt hip Pain Descriptors / Indicators: Aching;Burning;Sore Pain Intervention(s): Monitored during session;Repositioned    Home Living                      Prior Function            PT Goals (current goals can now be found in the care plan section) Progress towards PT goals: Progressing toward goals    Frequency    7X/week      PT Plan Current plan remains appropriate    Co-evaluation              AM-PAC PT "6 Clicks" Mobility   Outcome Measure  Help needed turning from your back to your side while in a flat bed without using bedrails?: A Little Help needed moving from lying on your back to sitting on the  side of a flat bed without using bedrails?: A Little Help needed moving to and from a bed to a chair (including a wheelchair)?: A Little Help needed standing up from a chair using your arms (e.g., wheelchair or bedside chair)?: A Little Help needed to walk in hospital room?: A Little Help needed climbing 3-5 steps with a railing? : A Lot 6 Click Score: 17    End of Session Equipment Utilized During Treatment: Gait belt Activity Tolerance: Patient limited by pain Patient left: in chair;with call bell/phone within reach;with chair alarm set Nurse Communication: Mobility status PT Visit Diagnosis: Muscle weakness (generalized) (M62.81);Difficulty in walking, not elsewhere classified (R26.2)     Time: 9977-4142 PT Time Calculation (min) (ACUTE ONLY): 44 min  Charges:  $Gait Training: 23-37 mins $Therapeutic Activity: 8-22 mins                     Thomasene Mohair PT,  DPT Acute Rehabilitation Services Office: [phone]   Sarajane Jews 02/04/2020, 4:31 PM

## 2020-02-04 NOTE — Discharge Instructions (Signed)

## 2020-02-04 NOTE — Progress Notes (Signed)
Physical Therapy Treatment Patient Details Name: Brenda Ortiz MRN: [id number] DOB: 08-12-1941 Today's Date: 02/04/2020    History of Present Illness Patient is 79 y.o. female s/p Lt THA anterior approach on 02/03/20 with PMH significant for hyothyroidism, OA.    PT Comments    Pt assisted with performed LE exercises.  Pt requested ambulating to bathroom again this afternoon and then assisted back to bed.  Pt did not feel able to tolerate ambulating in hallway yet.  Pt does not appear ready for d/c home today.    Follow Up Recommendations  Follow surgeon's recommendation for DC plan and follow-up therapies;Home health PT     Equipment Recommendations  Rolling walker with 5" wheels    Recommendations for Other Services       Precautions / Restrictions Precautions Precautions: Fall Restrictions Weight Bearing Restrictions: No    Mobility  Bed Mobility Overal bed mobility: Needs Assistance Bed Mobility: Supine to Sit;Sit to Supine     Supine to sit: Min guard Sit to supine: Min guard   General bed mobility comments: verbal cues for self assist, utilized bed rail  Transfers Overall transfer level: Needs assistance Equipment used: Rolling walker (2 wheeled) Transfers: Sit to/from Stand Sit to Stand: Min assist         General transfer comment: verbal cues for UE and LE positioning and weight shifting; assist to rise and steady  Ambulation/Gait Ambulation/Gait assistance: Min assist Gait Distance (Feet): 16 Feet Assistive device: Rolling walker (2 wheeled) Gait Pattern/deviations: Step-to pattern;Decreased stance time - left;Antalgic Gait velocity: decreased   General Gait Details: verbal cues for sequence, RW positioning, step length; pt requires significantly increased time; ambulated to bathroom again, declined hallway due to weakness and fatigue   Stairs             Wheelchair Mobility    Modified Rankin (Stroke Patients Only)        Balance                                            Cognition Arousal/Alertness: Awake/alert Behavior During Therapy: WFL for tasks assessed/performed Overall Cognitive Status: Within Functional Limits for tasks assessed                                        Exercises Total Joint Exercises Ankle Circles/Pumps: AROM;Both;10 reps Quad Sets: AROM;Left;10 reps Heel Slides: AAROM;Left;10 reps Hip ABduction/ADduction: AAROM;Left;10 reps Long Arc Quad: AROM;Left;10 reps    General Comments        Pertinent Vitals/Pain Pain Assessment: 0-10 Pain Score: 6  Pain Location: Lt hip Pain Descriptors / Indicators: Aching;Burning;Sore Pain Intervention(s): Repositioned;Monitored during session;Premedicated before session    Home Living                      Prior Function            PT Goals (current goals can now be found in the care plan section) Progress towards PT goals: Progressing toward goals    Frequency    7X/week      PT Plan Current plan remains appropriate    Co-evaluation              AM-PAC PT "6 Clicks" Mobility   Outcome Measure  Help  needed turning from your back to your side while in a flat bed without using bedrails?: A Little Help needed moving from lying on your back to sitting on the side of a flat bed without using bedrails?: A Little Help needed moving to and from a bed to a chair (including a wheelchair)?: A Little Help needed standing up from a chair using your arms (e.g., wheelchair or bedside chair)?: A Little Help needed to walk in hospital room?: A Little Help needed climbing 3-5 steps with a railing? : A Little 6 Click Score: 18    End of Session Equipment Utilized During Treatment: Gait belt Activity Tolerance: Patient limited by pain Patient left: in bed;with call bell/phone within reach;with bed alarm set;with family/visitor present Nurse Communication: Mobility status PT Visit  Diagnosis: Muscle weakness (generalized) (M62.81);Difficulty in walking, not elsewhere classified (R26.2)     Time: 4276-7011 PT Time Calculation (min) (ACUTE ONLY): 38 min  Charges:  $Gait Training: 8-22 mins $Therapeutic Exercise: 8-22 mins                    Paulino Door, DPT Acute Rehabilitation Services Office: [phone]  Brenda Ortiz 02/04/2020, 4:39 PM

## 2020-02-04 NOTE — TOC Initial Note (Signed)
Transition of Care [facility]) - Initial/Assessment Note    Patient Details  Name: Brenda Ortiz MRN: [id number] Date of Birth: 05/26/1941  [facility]) CM/SW Contact:    Armanda Heritage, RN Phone Number: 02/04/2020, 11:36 AM  Clinical Narrative:  Patient [facility] up with [facility] for HHPT. [employer] to deliver rolling walker to bedside. Patient declines 3in1.                 Expected Discharge Plan: Home w Home Health Services Barriers to Discharge: No Barriers Identified   Patient Goals and CMS Choice Patient states their goals for this hospitalization and ongoing recovery are:: to go home CMS Medicare.gov Compare Post Acute Care list provided to:: Patient Choice offered to / list presented to : Patient  Expected Discharge Plan and Services Expected Discharge Plan: Home w Home Health Services   Discharge Planning Services: CM Consult Post Acute Care Choice: Home Health Living arrangements for the past 2 months: Single Family Home Expected Discharge Date: 02/04/20               DME Arranged: Dan Humphreys rolling DME Agency: [employer] Date DME Agency Contacted: 02/04/20 Time DME Agency Contacted: 1136 Representative spoke with at DME Agency: [place] HH Arranged: PT HH Agency: [facility] (formerly [facility]) Date HH Agency Contacted: 02/04/20 Time HH Agency Contacted: 1136 Representative spoke with at [facility] Agency: Laurelyn Sickle  Prior Living Arrangements/Services Living arrangements for the past 2 months: Single Family Home   Patient language and need for interpreter reviewed:: Yes Do you feel safe going back to the place where you live?: Yes      Need for Family Participation in Patient Care: Yes (Comment) Care giver support system in place?: Yes (comment)   Criminal Activity/Legal Involvement Pertinent to Current Situation/Hospitalization: No - Comment as needed  Activities of Daily Living Home Assistive Devices/Equipment: Eyeglasses, Raised toilet seat  with rails ADL Screening (condition at time of admission) Patient's cognitive ability adequate to safely complete daily activities?: Yes Is the patient deaf or have difficulty hearing?: No Does the patient have difficulty seeing, even when wearing glasses/contacts?: No Does the patient have difficulty concentrating, remembering, or making decisions?: No Patient able to express need for assistance with ADLs?: Yes Does the patient have difficulty dressing or bathing?: No Independently performs ADLs?: Yes (appropriate for developmental age) Does the patient have difficulty walking or climbing stairs?: No Weakness of Legs: None Weakness of Arms/Hands: None  Permission Sought/Granted                  Emotional Assessment Appearance:: Appears stated age Attitude/Demeanor/Rapport: Engaged Affect (typically observed): Accepting Orientation: : Oriented to Self, Oriented to Place, Oriented to  Time, Oriented to Situation   Psych Involvement: No (comment)  Admission diagnosis:  Status post total replacement of left hip [Z96.642] Patient Active Problem List   Diagnosis Date Noted  . Status post total replacement of left hip 02/03/2020  . Unilateral primary osteoarthritis, left hip 12/01/2019  . Low back pain 10/27/2019  . Left hip pain 10/27/2019  . Right hip pain 07/14/2017  . Chest pain 06/11/2015  . Hypothyroidism 06/11/2015   PCP:  Chilton Greathouse, MD Pharmacy:   [id number] - [place], [place] - [address] AT [facility] [address] [place] [address] Phone: [phone] Fax: [phone]  [id number] - [place], [place] - [address] AT [facility] [address]  [place] [address] Phone: [phone] Fax: [phone]     Social Determinants of Health (SDOH) Interventions    Readmission Risk Interventions No flowsheet data found.

## 2020-02-05 DIAGNOSIS — M1612 Unilateral primary osteoarthritis, left hip: Secondary | ICD-10-CM | POA: Diagnosis not present

## 2020-02-05 NOTE — Discharge Summary (Signed)
Discharge Diagnoses:  Principal Problem:   Unilateral primary osteoarthritis, left hip Active Problems:   Status post total replacement of left hip   Surgeries: Procedure(s): LEFT TOTAL HIP ARTHROPLASTY ANTERIOR APPROACH on 02/03/2020    Consultants:   Discharged Condition: Improved  Hospital Course: Brenda Ortiz is an 79 y.o. female who was admitted 02/03/2020 with a chief complaint of osteoarthritis left hip, with a final diagnosis of Left hip osteoarthritis.  Patient was brought to the operating room on 02/03/2020 and underwent Procedure(s): LEFT TOTAL HIP ARTHROPLASTY ANTERIOR APPROACH.    Patient was given perioperative antibiotics:  Anti-infectives (From admission, onward)   Start     Dose/Rate Route Frequency Ordered Stop   02/03/20 1600  ceFAZolin (ANCEF) IVPB 1 g/50 mL premix     1 g 100 mL/hr over 30 Minutes Intravenous Every 6 hours 02/03/20 1408 02/03/20 2230   02/03/20 0745  ceFAZolin (ANCEF) IVPB 2g/100 mL premix     2 g 200 mL/hr over 30 Minutes Intravenous On call to O.R. 02/03/20 0736 02/03/20 1001    .  Patient was given sequential compression devices, early ambulation, and aspirin for DVT prophylaxis.  Recent vital signs:  Patient Vitals for the past 24 hrs:  BP Temp Temp src Pulse Resp SpO2  02/05/20 0514 126/60 98.3 F (36.8 C) Oral 78 14 93 %  02/04/20 2052 125/60 98.4 F (36.9 C) Oral 68 16 98 %  .  Recent laboratory studies: DG Pelvis Portable  Result Date: 02/03/2020 CLINICAL DATA:  Left hip arthroplasty EXAM: PORTABLE PELVIS 1-2 VIEWS COMPARISON:  12/01/2019 FINDINGS: Single AP view of the pelvis demonstrates interval postsurgical changes from left total hip arthroplasty. Arthroplasty components appear in their expected alignment without periprosthetic fracture. Expected postsurgical changes within the soft tissues overlying the left hip. IMPRESSION: Interval postsurgical changes from left total hip arthroplasty. Electronically Signed   By:  Duanne Guess D.O.   On: 02/03/2020 11:40   DG C-Arm 1-60 Min-No Report  Result Date: 02/03/2020 CLINICAL DATA:  Osteoarthritis of the left hip. Status post left total hip arthroplasty. EXAM: OPERATIVE LEFT HIP (WITH PELVIS IF PERFORMED) 1 VIEW, dg C-arm 1-60 MIN TECHNIQUE: Fluoroscopic spot image(s) were submitted for interpretation post-operatively. COMPARISON:  RADIOGRAPHS DATED 12/01/2019 FINDINGS: AP C-arm images demonstrate that the acetabular and femoral components of the total hip prosthesis are in excellent position. No fracture or dislocation. IMPRESSION: Satisfactory appearance of the left hip after total hip prosthesis insertion. FLUOROSCOPY TIME:  16.5 seconds.  1.97 mGy. C-arm fluoroscopic images were obtained intraoperatively and submitted for post operative interpretation. Electronically Signed   By: Francene Boyers M.D.   On: 02/03/2020 11:08   DG HIP OPERATIVE UNILAT W OR W/O PELVIS LEFT  Result Date: 02/03/2020 CLINICAL DATA:  Osteoarthritis of the left hip. Status post left total hip arthroplasty. EXAM: OPERATIVE LEFT HIP (WITH PELVIS IF PERFORMED) 1 VIEW, dg C-arm 1-60 MIN TECHNIQUE: Fluoroscopic spot image(s) were submitted for interpretation post-operatively. COMPARISON:  RADIOGRAPHS DATED 12/01/2019 FINDINGS: AP C-arm images demonstrate that the acetabular and femoral components of the total hip prosthesis are in excellent position. No fracture or dislocation. IMPRESSION: Satisfactory appearance of the left hip after total hip prosthesis insertion. FLUOROSCOPY TIME:  16.5 seconds.  1.97 mGy. C-arm fluoroscopic images were obtained intraoperatively and submitted for post operative interpretation. Electronically Signed   By: Francene Boyers M.D.   On: 02/03/2020 11:08    Discharge Medications:   Allergies as of 02/05/2020   No Known Allergies  Medication List    TAKE these medications   acetaminophen 500 MG tablet Commonly known as: TYLENOL Take 500 mg by mouth every 6  (six) hours as needed (for pain.).   aspirin 81 MG chewable tablet Chew 1 tablet (81 mg total) by mouth 2 (two) times daily.   calcium-vitamin D 250-125 MG-UNIT tablet Commonly known as: OSCAL Take 1 tablet by mouth at bedtime.   fexofenadine 180 MG tablet Commonly known as: ALLEGRA Take 180 mg by mouth daily as needed for allergies or rhinitis.   methocarbamol 500 MG tablet Commonly known as: ROBAXIN Take 1 tablet (500 mg total) by mouth every 6 (six) hours as needed for muscle spasms.   multivitamin with minerals Tabs tablet Take 1 tablet by mouth at bedtime.   naproxen sodium 220 MG tablet Commonly known as: ALEVE Take 220 mg by mouth daily as needed (pain.).   oxyCODONE 5 MG immediate release tablet Commonly known as: Oxy IR/ROXICODONE Take 1 tablet (5 mg total) by mouth every 4 (four) hours as needed for moderate pain.   Prempro 0.625-2.5 MG tablet Generic drug: estrogen (conjugated)-medroxyprogesterone Take 1 tablet by mouth at bedtime.   Synthroid 75 MCG tablet Generic drug: levothyroxine Take 75 mcg by mouth daily before breakfast.            Durable Medical Equipment  (From admission, onward)         Start     Ordered   02/03/20 1409  DME 3 n 1  Once     02/03/20 1408   02/03/20 1409  DME Walker rolling  Once    Question Answer Comment  Walker: With 5 Inch Wheels   Patient needs a walker to treat with the following condition Status post total replacement of left hip      02/03/20 1408          Diagnostic Studies: DG Pelvis Portable  Result Date: 02/03/2020 CLINICAL DATA:  Left hip arthroplasty EXAM: PORTABLE PELVIS 1-2 VIEWS COMPARISON:  12/01/2019 FINDINGS: Single AP view of the pelvis demonstrates interval postsurgical changes from left total hip arthroplasty. Arthroplasty components appear in their expected alignment without periprosthetic fracture. Expected postsurgical changes within the soft tissues overlying the left hip. IMPRESSION:  Interval postsurgical changes from left total hip arthroplasty. Electronically Signed   By: Davina Poke D.O.   On: 02/03/2020 11:40   DG C-Arm 1-60 Min-No Report  Result Date: 02/03/2020 CLINICAL DATA:  Osteoarthritis of the left hip. Status post left total hip arthroplasty. EXAM: OPERATIVE LEFT HIP (WITH PELVIS IF PERFORMED) 1 VIEW, dg C-arm 1-60 MIN TECHNIQUE: Fluoroscopic spot image(s) were submitted for interpretation post-operatively. COMPARISON:  RADIOGRAPHS DATED 12/01/2019 FINDINGS: AP C-arm images demonstrate that the acetabular and femoral components of the total hip prosthesis are in excellent position. No fracture or dislocation. IMPRESSION: Satisfactory appearance of the left hip after total hip prosthesis insertion. FLUOROSCOPY TIME:  16.5 seconds.  1.97 mGy. C-arm fluoroscopic images were obtained intraoperatively and submitted for post operative interpretation. Electronically Signed   By: Lorriane Shire M.D.   On: 02/03/2020 11:08   DG HIP OPERATIVE UNILAT W OR W/O PELVIS LEFT  Result Date: 02/03/2020 CLINICAL DATA:  Osteoarthritis of the left hip. Status post left total hip arthroplasty. EXAM: OPERATIVE LEFT HIP (WITH PELVIS IF PERFORMED) 1 VIEW, dg C-arm 1-60 MIN TECHNIQUE: Fluoroscopic spot image(s) were submitted for interpretation post-operatively. COMPARISON:  RADIOGRAPHS DATED 12/01/2019 FINDINGS: AP C-arm images demonstrate that the acetabular and femoral components of the total  hip prosthesis are in excellent position. No fracture or dislocation. IMPRESSION: Satisfactory appearance of the left hip after total hip prosthesis insertion. FLUOROSCOPY TIME:  16.5 seconds.  1.97 mGy. C-arm fluoroscopic images were obtained intraoperatively and submitted for post operative interpretation. Electronically Signed   By: Francene Boyers M.D.   On: 02/03/2020 11:08    Patient benefited maximally from their hospital stay and there were no complications.     Disposition: Discharge  disposition: 01-Home or Self Care      Discharge Instructions    Call MD / Call 911   Complete by: As directed    If you experience chest pain or shortness of breath, CALL 911 and be transported to the hospital emergency room.  If you develope a fever above 101 F, pus (white drainage) or increased drainage or redness at the wound, or calf pain, call your surgeon's office.   Constipation Prevention   Complete by: As directed    Drink plenty of fluids.  Prune juice may be helpful.  You may use a stool softener, such as Colace (over the counter) 100 mg twice a day.  Use MiraLax (over the counter) for constipation as needed.   Diet - low sodium heart healthy   Complete by: As directed    Increase activity slowly as tolerated   Complete by: As directed      Follow-up Information    Kathryne Hitch, MD Follow up in 2 week(s).   Specialty: Orthopedic Surgery Contact information: [address] [place] [address] [phone]        [facility] Follow up.   Specialty: Home Health Services Why: agency will provide home health physical therapy. agency will call you to schedule first visit. Contact information: [address] [place] [address] [phone]            Signed: Nadara Mustard 02/05/2020, 8:23 AM

## 2020-02-05 NOTE — Progress Notes (Signed)
Physical Therapy Treatment Patient Details Name: Brenda Ortiz MRN: [id number] DOB: Sep 25, 1941 Today's Date: 02/05/2020    History of Present Illness Patient is 79 y.o. female s/p Lt THA anterior approach on 02/03/20 with PMH significant for hyothyroidism, OA.    PT Comments    Pt progressing well. Improved gait stability today, although decr velocity this pm compared to am. Reviewed stairs with dtr, pt and dtr able to return demo; visually simulated/demo'd up/down for pt husband. Pt ready to d/c with family support from PT standpoint.    Follow Up Recommendations  Follow surgeon's recommendation for DC plan and follow-up therapies;Home health PT     Equipment Recommendations  Rolling walker with 5" wheels    Recommendations for Other Services       Precautions / Restrictions Precautions Precautions: Fall Restrictions Weight Bearing Restrictions: No    Mobility  Bed Mobility               General bed mobility comments: in chair   Transfers Overall transfer level: Needs assistance Equipment used: Rolling walker (2 wheeled) Transfers: Sit to/from Stand Sit to Stand: Supervision(from recliner and BSC)         General transfer comment: verbal cues for UE and LE positioning and weight shifting;   Ambulation/Gait Ambulation/Gait assistance: Min guard;Supervision Gait Distance (Feet): 30 Feet(10' more ) Assistive device: Rolling walker (2 wheeled) Gait Pattern/deviations: Step-to pattern;Decreased stance time - left;Antalgic Gait velocity: decreased   General Gait Details: verbal cues for sequence, gait progression. improved fluidity of gait today, incr step length   Stairs Stairs: Yes Stairs assistance: Min assist;Min guard Stair Management: Step to pattern;Backwards;With walker Number of Stairs: 1(x2) General stair comments: cues for sequence and safe technique    Wheelchair Mobility    Modified Rankin (Stroke Patients Only)       Balance                                             Cognition Arousal/Alertness: Awake/alert Behavior During Therapy: WFL for tasks assessed/performed Overall Cognitive Status: Within Functional Limits for tasks assessed                                        Exercises      General Comments        Pertinent Vitals/Pain Pain Assessment: 0-10 Pain Score: 4  Pain Location: Lt hip Pain Descriptors / Indicators: Aching;Burning;Sore Pain Intervention(s): Limited activity within patient's tolerance;Monitored during session;Premedicated before session    Home Living                      Prior Function            PT Goals (current goals can now be found in the care plan section) Acute Rehab PT Goals Patient Stated Goal: to stop hurting PT Goal Formulation: With patient Time For Goal Achievement: 02/10/20 Potential to Achieve Goals: Good Progress towards PT goals: Progressing toward goals    Frequency    7X/week      PT Plan Current plan remains appropriate    Co-evaluation              AM-PAC PT "6 Clicks" Mobility   Outcome Measure  Help needed turning from your back to  your side while in a flat bed without using bedrails?: A Little Help needed moving from lying on your back to sitting on the side of a flat bed without using bedrails?: A Little Help needed moving to and from a bed to a chair (including a wheelchair)?: A Little Help needed standing up from a chair using your arms (e.g., wheelchair or bedside chair)?: A Little Help needed to walk in hospital room?: A Little Help needed climbing 3-5 steps with a railing? : A Little 6 Click Score: 18    End of Session Equipment Utilized During Treatment: Gait belt Activity Tolerance: Patient tolerated treatment well Patient left: in bed;with call bell/phone within reach;with bed alarm set;with family/visitor present Nurse Communication: Mobility status PT Visit Diagnosis:  Muscle weakness (generalized) (M62.81);Difficulty in walking, not elsewhere classified (R26.2) Pain - Right/Left: Left Pain - part of body: Hip     Time: 1025-8527 PT Time Calculation (min) (ACUTE ONLY): 50 min  Charges:  $Gait Training: 38-52 mins                     Kyla Duffy, PT   Acute Rehab Dept [facility]): [phone]   02/05/2020    [facility] 02/05/2020, 3:59 PM

## 2020-02-05 NOTE — Progress Notes (Signed)
Physical Therapy Treatment Patient Details Name: Brenda Ortiz MRN: [id number] DOB: 08/21/1941 Today's Date: 02/05/2020    History of Present Illness Patient is 79 y.o. female s/p Lt THA anterior approach on 02/03/20 with PMH significant for hyothyroidism, OA.    PT Comments    Pt progressing well this am, incr gait distance and less assist overall; will see again this pm--pt may be ready for d/c today depending on pm progress   Follow Up Recommendations  Follow surgeon's recommendation for DC plan and follow-up therapies;Home health PT     Equipment Recommendations  Rolling walker with 5" wheels    Recommendations for Other Services       Precautions / Restrictions Precautions Precautions: Fall Restrictions Weight Bearing Restrictions: No    Mobility  Bed Mobility Overal bed mobility: Needs Assistance Bed Mobility: Supine to Sit;Sit to Supine     Supine to sit: Min guard;Supervision Sit to supine: Min guard;Supervision   General bed mobility comments: verbal cues for self assist   Transfers Overall transfer level: Needs assistance Equipment used: Rolling walker (2 wheeled) Transfers: Sit to/from Stand Sit to Stand: Min guard;Supervision         General transfer comment: verbal cues for UE and LE positioning and weight shifting;   Ambulation/Gait Ambulation/Gait assistance: Min guard;Supervision Gait Distance (Feet): 55 Feet Assistive device: Rolling walker (2 wheeled) Gait Pattern/deviations: Step-to pattern;Decreased stance time - left;Antalgic     General Gait Details: verbal cues for sequence, gait progression. improved fluidity of gait today, incr step length   Stairs             Wheelchair Mobility    Modified Rankin (Stroke Patients Only)       Balance                                            Cognition Arousal/Alertness: Awake/alert Behavior During Therapy: WFL for tasks assessed/performed Overall  Cognitive Status: Within Functional Limits for tasks assessed                                        Exercises Total Joint Exercises Ankle Circles/Pumps: AROM;Both;10 reps Quad Sets: AROM;Left;10 reps Heel Slides: AAROM;Left;10 reps Hip ABduction/ADduction: AAROM;Left;10 reps    General Comments        Pertinent Vitals/Pain Pain Assessment: 0-10 Pain Score: 5  Pain Location: Lt hip Pain Descriptors / Indicators: Aching;Burning;Sore Pain Intervention(s): Limited activity within patient's tolerance;Monitored during session;Premedicated before session;Repositioned    Home Living                      Prior Function            PT Goals (current goals can now be found in the care plan section) Acute Rehab PT Goals Patient Stated Goal: to stop hurting PT Goal Formulation: With patient Time For Goal Achievement: 02/10/20 Potential to Achieve Goals: Good Progress towards PT goals: Progressing toward goals    Frequency    7X/week      PT Plan Current plan remains appropriate    Co-evaluation              AM-PAC PT "6 Clicks" Mobility   Outcome Measure  Help needed turning from your back to your side while in  a flat bed without using bedrails?: A Little Help needed moving from lying on your back to sitting on the side of a flat bed without using bedrails?: A Little Help needed moving to and from a bed to a chair (including a wheelchair)?: A Little Help needed standing up from a chair using your arms (e.g., wheelchair or bedside chair)?: A Little Help needed to walk in hospital room?: A Little Help needed climbing 3-5 steps with a railing? : A Little 6 Click Score: 18    End of Session Equipment Utilized During Treatment: Gait belt Activity Tolerance: Patient tolerated treatment well Patient left: in bed;with call bell/phone within reach;with bed alarm set;with family/visitor present Nurse Communication: Mobility status PT Visit  Diagnosis: Muscle weakness (generalized) (M62.81);Difficulty in walking, not elsewhere classified (R26.2) Pain - Right/Left: Left Pain - part of body: Hip     Time: 9980-0123 PT Time Calculation (min) (ACUTE ONLY): 32 min  Charges:  $Gait Training: 8-22 mins $Therapeutic Exercise: 8-22 mins                     Alie Hardgrove, PT   Acute Rehab Dept [facility]): [phone]   02/05/2020    [facility] 02/05/2020, 11:36 AM

## 2020-02-05 NOTE — Progress Notes (Signed)
Patient ID: Brenda Ortiz, female   DOB: 09-19-1941, 79 y.o.   MRN: [id number] Patient states she still has some pain and was slow with therapy yesterday.  Plan for therapy today discussed that if she does well with therapy orders are written for discharge to home if patient does not do well with therapy anticipate discharge on Monday.

## 2020-02-07 ENCOUNTER — Telehealth: Payer: Self-pay | Admitting: Physician Assistant

## 2020-02-07 ENCOUNTER — Encounter: Payer: Self-pay | Admitting: *Deleted

## 2020-02-07 NOTE — Telephone Encounter (Signed)
Gracee with [facility] called. She would like verbal orders for PT 3x wk 2 wk. Her call back number is [phone]

## 2020-02-08 NOTE — Telephone Encounter (Signed)
Called Gracee back to approve orders.

## 2020-02-09 ENCOUNTER — Telehealth: Payer: Self-pay | Admitting: Orthopaedic Surgery

## 2020-02-09 MED ORDER — OXYCODONE HCL 5 MG PO TABS: 5.0000 mg | ORAL_TABLET | ORAL | 0 refills | Status: DC | PRN

## 2020-02-09 NOTE — Telephone Encounter (Signed)
Patient called.  She needs a refill on her oxycodone.   Call back: [phone]

## 2020-02-09 NOTE — Telephone Encounter (Signed)
Please advise 

## 2020-02-16 ENCOUNTER — Other Ambulatory Visit: Payer: Self-pay

## 2020-02-16 ENCOUNTER — Ambulatory Visit (INDEPENDENT_AMBULATORY_CARE_PROVIDER_SITE_OTHER): Payer: Medicare PPO | Admitting: Physician Assistant

## 2020-02-16 ENCOUNTER — Encounter: Payer: Self-pay | Admitting: Physician Assistant

## 2020-02-16 DIAGNOSIS — Z96642 Presence of left artificial hip joint: Secondary | ICD-10-CM

## 2020-02-16 MED ORDER — OXYCODONE-ACETAMINOPHEN 5-325 MG PO TABS: 1.0000 | ORAL_TABLET | Freq: Four times a day (QID) | ORAL | 0 refills | Status: DC | PRN

## 2020-02-16 MED ORDER — METHOCARBAMOL 500 MG PO TABS: 500.0000 mg | ORAL_TABLET | Freq: Two times a day (BID) | ORAL | 0 refills | Status: DC | PRN

## 2020-02-16 NOTE — Progress Notes (Signed)
Post-Op Visit Note   Patient: Brenda Ortiz           Date of Birth: 10/16/1941           MRN: [id number] Visit Date: 02/16/2020 PCP: Chilton Greathouse, MD   Assessment & Plan:  Chief Complaint: No chief complaint on file.  Visit Diagnoses:  1. Status post total hip replacement, left     Plan: Patient is a pleasant 79 year old female who comes in today 2 weeks out left anterior total hip replacement by Dr. Magnus Ivan 02/03/2020.  She has been doing well.  She has been getting home health physical therapy where she is progressing nicely and ambulating with a walker.  She has been taking oxycodone and Robaxin nearly around-the-clock.  No fevers or chills.  Examination of the left hip reveals a well-healing surgical incision with staples intact.  She does have moderate swelling to the left lower extremity.  Calf is soft and nontender.  She is neurovascular intact distally.  Today, staples were removed and Steri-Strips applied.  She will continue with home health physical therapy for 1 additional visit and then transition to an outpatient setting.  Internal referral has been made for this.  I have refilled her oxycodone and Robaxin but we will start the weaning process now.  She will follow-up with Dr. Magnus Ivan in 4 weeks time for repeat evaluation and AP pelvis lateral left hip x-rays.  Follow-Up Instructions: Return in about 4 weeks (around 03/15/2020) for with Dr. Magnus Ivan.   Orders:  No orders of the defined types were placed in this encounter.  No orders of the defined types were placed in this encounter.   Imaging: No new imaging  PMFS History: Patient Active Problem List   Diagnosis Date Noted  . Status post total replacement of left hip 02/03/2020  . Unilateral primary osteoarthritis, left hip 12/01/2019  . Low back pain 10/27/2019  . Left hip pain 10/27/2019  . Right hip pain 07/14/2017  . Chest pain 06/11/2015  . Hypothyroidism 06/11/2015   Past Medical History:    Diagnosis Date  . Arthritis   . Hypothyroidism   . Incontinence   . Seasonal allergies     Family History  Problem Relation Age of Onset  . Arthritis Mother   . Arthritis Father   . Diabetes Father   . Diabetes Brother   . Diabetes Paternal Grandmother     Past Surgical History:  Procedure Laterality Date  . DILATION AND CURETTAGE OF UTERUS     x2  . FOOT SURGERY Right   . HYSTEROSCOPY WITH D & C N/A 08/14/2016   Procedure: DILATATION AND CURETTAGE /HYSTEROSCOPY;  Surgeon: Olivia Mackie, MD;  Location: [facility];  Service: Gynecology;  Laterality: N/A;  . TONSILLECTOMY    . TOTAL HIP ARTHROPLASTY Left 02/03/2020   Procedure: LEFT TOTAL HIP ARTHROPLASTY ANTERIOR APPROACH;  Surgeon: Kathryne Hitch, MD;  Location: [facility];  Service: Orthopedics;  Laterality: Left;   Social History   Occupational History  . Not on file  Tobacco Use  . Smoking status: Former Smoker    Packs/day: 0.50    Years: 10.00    Pack years: 5.00    Types: Cigarettes    Quit date: 11/17/1970    Years since quitting: 49.2  . Smokeless tobacco: Never Used  Substance and Sexual Activity  . Alcohol use: Yes    Alcohol/week: 0.0 standard drinks    Comment: 2-3 times a week  . Drug use: No  .  Sexual activity: Not on file

## 2020-02-23 ENCOUNTER — Ambulatory Visit: Payer: Medicare PPO | Attending: Physician Assistant | Admitting: Physical Therapy

## 2020-02-23 ENCOUNTER — Other Ambulatory Visit: Payer: Self-pay

## 2020-02-23 ENCOUNTER — Encounter: Payer: Self-pay | Admitting: Physical Therapy

## 2020-02-23 DIAGNOSIS — M25652 Stiffness of left hip, not elsewhere classified: Secondary | ICD-10-CM | POA: Insufficient documentation

## 2020-02-23 DIAGNOSIS — M25559 Pain in unspecified hip: Secondary | ICD-10-CM | POA: Diagnosis not present

## 2020-02-23 DIAGNOSIS — R29898 Other symptoms and signs involving the musculoskeletal system: Secondary | ICD-10-CM | POA: Insufficient documentation

## 2020-02-23 NOTE — Therapy (Addendum)
[facility] [address] [place], [place], [address] Phone: [phone]   Fax:  [phone]  Physical Therapy Evaluation  Patient Details  Name: Brenda Ortiz MRN: [id number] Date of Birth: [birth year] Referring Provider (PT): Cristie Hem, [place]   Encounter Date: 02/23/2020  PT End of Session - 02/23/20 1226    Visit Number  1    Number of Visits  17    Date for PT Re-Evaluation  04/19/20    Progress Note Due on Visit  10    PT Start Time  1138    PT Stop Time  1226    PT Time Calculation (min)  48 min    Activity Tolerance  Patient tolerated treatment well    Behavior During Therapy  [facility] for tasks assessed/performed       Past Medical History:  Diagnosis Date  . Arthritis   . Hypothyroidism   . Incontinence   . Seasonal allergies     Past Surgical History:  Procedure Laterality Date  . DILATION AND CURETTAGE OF UTERUS     x2  . FOOT SURGERY Right   . HYSTEROSCOPY WITH D & C N/A 08/14/2016   Procedure: DILATATION AND CURETTAGE /HYSTEROSCOPY;  Surgeon: Olivia Mackie, MD;  Location: [facility];  Service: Gynecology;  Laterality: N/A;  . TONSILLECTOMY    . TOTAL HIP ARTHROPLASTY Left 02/03/2020   Procedure: LEFT TOTAL HIP ARTHROPLASTY ANTERIOR APPROACH;  Surgeon: Kathryne Hitch, MD;  Location: [facility];  Service: Orthopedics;  Laterality: Left;    There were no vitals filed for this visit.   Subjective Assessment - 02/23/20 1134    Subjective  " I had arthritis in my hip. I went to PT when it first started years ago. We tried the shot, but it didn't work, the biggest issue was discomfort when walking. Everything is going fine after the surgery. It hurts after the surgery, but i'm just having discomfort. I've been given 4 home health sessions.    Limitations  Lifting;Walking;Standing;Other (comment)   Getting balance and getting first steps   How long can you sit comfortably?  unlimited    How long can  you stand comfortably?  unlimited    How long can you walk comfortably?  unlimited    Patient Stated Goals  Wants to be able to get up stairs and walking outside    Currently in Pain?  No/denies   Took Oxycodone   Pain Score  0-No pain    Pain Location  Hip    Pain Orientation  Left    Pain Type  Surgical pain    Pain Frequency  Intermittent         [facility] PT Assessment - 02/23/20 0001      Assessment   Medical Diagnosis  Status post total hip replacement    Referring Provider (PT)  Cristie Hem, PA-C    Onset Date/Surgical Date  02/03/20    Hand Dominance  Right    Next MD Visit  03/15/2020    Prior Therapy  Yes      Precautions   Precautions  Anterior Hip    Precaution Booklet Issued  No      Restrictions   Weight Bearing Restrictions  No      Balance Screen   Has the patient fallen in the past 6 months  No   Indicates balance as an issue   Has the patient had a decrease in activity level because  of a fear of falling?   No    Is the patient reluctant to leave their home because of a fear of falling?   No      Home Environment   Living Environment  Private residence    Living Arrangements  Spouse/significant other    Available Help at Discharge  Family    Type of Home  House    Home Access  Level entry    Home Layout  Multi-level    Alternate Level Stairs-Number of Steps  14 steps each level    Alternate Level Stairs-Rails  Left    Home Equipment  Marvell - single point;Walker - 2 wheels      Prior Function   Level of Independence  Independent with basic ADLs    Vocation  Retired;Volunteer work    Leisure  Walking, exercising, gardening       Cognition   Overall Cognitive Status  Within Functional Limits for tasks assessed    Attention  Focused    Focused Attention  Appears intact    Memory  Appears intact    Awareness  Appears intact    Problem Solving  Appears intact    Executive Function  Reasoning    Reasoning  Appears intact      Observation/Other  Assessments   Focus on Therapeutic Outcomes (FOTO)   47% limited   predicted 31% limited     Posture/Postural Control   Posture/Postural Control  Postural limitations    Postural Limitations  Rounded Shoulders;Forward head;Decreased lumbar lordosis      ROM / Strength   AROM / PROM / Strength  AROM;PROM;Strength      AROM   AROM Assessment Site  Hip    Right/Left Hip  Left;Right    Right Hip Extension  -15   from neutral, standing with elbows on table    Right Hip Flexion  104    Right Hip External Rotation   8   mild flexion noted during assessment   Right Hip Internal Rotation   24    Left Hip Extension  -2   from neutral, standing with elbows on table    Left Hip Flexion  64    Left Hip External Rotation   24   mild flexion noted during assessment   Left Hip Internal Rotation   14      PROM   PROM Assessment Site  Hip    Right/Left Hip  Left    Left Hip Flexion  84   end range stiffness     Strength   Strength Assessment Site  Hip    Right/Left Hip  Right;Left    Right Hip Flexion  4/5    Right Hip Extension  3+/5   in availble ROM   Left Hip Flexion  4/5    Left Hip Extension  3+/5   inavailble ROM   Left Hip ABduction  3+/5   within availble ROM               Objective measurements completed on examination: See above findings.      [facility] Adult PT Treatment/Exercise - 02/23/20 0001      Exercises   Exercises  Knee/Hip      Knee/Hip Exercises: Stretches   Hip Flexor Stretch  Left;1 rep;30 seconds      Knee/Hip Exercises: Seated   Sit to Sand  1 set;5 reps      Knee/Hip Exercises: Supine   Straight Leg Raises  AROM;Left;1 set;10 reps      Knee/Hip Exercises: Sidelying   Clams  1 set, 10 reps             PT Education - 02/23/20 1254    Education Details  Patient was educated on new HEP, importance of good mechanics when standing from a seated position, and progression expectations    Person(s) Educated  Patient    Methods   Explanation;Demonstration;Handout    Comprehension  Verbalized understanding;Returned demonstration       PT Short Term Goals - 02/23/20 1255      PT SHORT TERM GOAL #1   Title  Patient will be able to independently initiate HEP    Baseline  Patient received HEP on 02/23/2020    Time  4    Period  Weeks    Status  New    Target Date  03/22/20      PT SHORT TERM GOAL #2   Title  Patient will be able to demonstrate and verbalize appropriate posture    Baseline  Forward head, decreased lumbar lordosis, Rounded shoulders    Time  4    Period  Weeks    Status  New    Target Date  03/22/20      PT SHORT TERM GOAL #3   Title  Patient will demonstrate an increased heel strike during initial contact in order to ambulate without an assistive device    Baseline  Using walker    Time  4    Period  Weeks    Status  New    Target Date  03/22/20        PT Long Term Goals - 02/23/20 1301      PT LONG TERM GOAL #1   Title  Patient will demsotrate 4+/5 gross bilateral hip strength in order to walk without pain    Baseline  4/5    Time  8    Period  Weeks    Status  New    Target Date  04/19/20      PT LONG TERM GOAL #2   Title  Patient will increase Lt. hip flexion and extension by >/= 10 degrees in order to navigate stairs in her home    Baseline  Lt. hip flexion: 64 Lt. hip extension: -2    Time  8    Period  Weeks    Status  New    Target Date  04/19/20      PT LONG TERM GOAL #3   Title  Patient will demonstrate 4/5 Lt. hip extension and abduction in order to get back to exercising independently per the patient's goals.    Baseline  extension: 3+/5,  abduction: 3+/5    Time  8    Period  Weeks    Status  New    Target Date  04/19/20      PT LONG TERM GOAL #4   Title  Patient will be independent with workout program to promote hip strength and stability.    Time  8    Period  Weeks    Target Date  04/19/20             Plan - 02/23/20 1243    Clinical  Impression Statement  Patient presents to the clinic with left hip stiffness after a THA on 02/03/2020. She reports no pain, only slight discomfort. She has limited Left hip ROM and bilateral weakness in the hip flexors and abductors. It  was noted that the patient has forefoot inversion and a decreased heel strike when walking. She reports that she would like to be able to walk up the stairs in her home and not have to use a cane within the next 3 weeks. Patient was educated on the importance of not pushing through her legs during sit-to-stands, and required a moderate amount of motivation and cueing. She would benefit from PT to address stiffness, pain, and ROM deficits in the left hip.    Personal Factors and Comorbidities  Age;Comorbidity 2    Comorbidities  Arthritis, Hypothyroidism    Examination-Activity Limitations  Continence;Dressing;Lift;Locomotion Level;Squat;Stairs;Stand;Transfers    Examination-Participation Restrictions  Community Activity    Stability/Clinical Decision Making  Stable/Uncomplicated    Clinical Decision Making  Low    Rehab Potential  Good    PT Frequency  2x / week    PT Duration  8 weeks    PT Treatment/Interventions  ADLs/Self Care Home Management;Cryotherapy;Electrical Stimulation;Iontophoresis 4mg /ml Dexamethasone;Moist Heat;Traction;Ultrasound;Gait training;Stair training;Functional mobility training;Therapeutic activities;Therapeutic exercise;Balance training;Neuromuscular re-education;Cognitive remediation;Patient/family education;Manual techniques;Compression bandaging;Scar mobilization;Passive range of motion;Dry needling;Energy conservation;Splinting;Visual/perceptual remediation/compensation;Joint Manipulations    PT Next Visit Plan  DIscuss HEP, Sit-to-stand, work on gait, Hip strengthening, stair training    PT Home Exercise Plan  Clams, SLR, Hip Flexor Stretch, Isometric Adduction w/ glute bridge    Consulted and Agree with Plan of Care  Patient        Patient will benefit from skilled therapeutic intervention in order to improve the following deficits and impairments:  Abnormal gait, Decreased balance, Decreased coordination, Decreased mobility, Decreased range of motion, Decreased scar mobility, Decreased strength, Difficulty walking, Increased fascial restricitons, Impaired flexibility, Improper body mechanics, Postural dysfunction, Pain  Visit Diagnosis: Hip pain  Stiffness of left hip, not elsewhere classified  Weakness of both hips     Problem List Patient Active Problem List   Diagnosis Date Noted  . Status post total replacement of left hip 02/03/2020  . Unilateral primary osteoarthritis, left hip 12/01/2019  . Low back pain 10/27/2019  . Left hip pain 10/27/2019  . Right hip pain 07/14/2017  . Chest pain 06/11/2015  . Hypothyroidism 06/11/2015    06/13/2015, SPT 02/23/2020, 1:26 PM  [facility] [address] [place], [place], [place] Phone: [phone]   Fax:  [phone]  Name: ANDRIEA HASEGAWA MRN: [name] Date of Birth: March 01, 1941

## 2020-02-24 ENCOUNTER — Telehealth: Payer: Self-pay | Admitting: Orthopaedic Surgery

## 2020-02-24 MED ORDER — METHOCARBAMOL 500 MG PO TABS: 500.0000 mg | ORAL_TABLET | Freq: Two times a day (BID) | ORAL | 0 refills | Status: DC | PRN

## 2020-02-24 MED ORDER — OXYCODONE-ACETAMINOPHEN 5-325 MG PO TABS: 1.0000 | ORAL_TABLET | Freq: Four times a day (QID) | ORAL | 0 refills | Status: DC | PRN

## 2020-02-24 NOTE — Telephone Encounter (Signed)
Pls advise. Thanks.  

## 2020-02-24 NOTE — Telephone Encounter (Signed)
Rx refill Oxycodon & Methocarbamol  Pharmacy: [facility] @ [address]

## 2020-02-29 ENCOUNTER — Ambulatory Visit: Payer: Medicare PPO | Admitting: Rehabilitative and Restorative Service Providers"

## 2020-02-29 ENCOUNTER — Encounter: Payer: Self-pay | Admitting: Rehabilitative and Restorative Service Providers"

## 2020-02-29 ENCOUNTER — Other Ambulatory Visit: Payer: Self-pay

## 2020-02-29 DIAGNOSIS — M25559 Pain in unspecified hip: Secondary | ICD-10-CM

## 2020-02-29 DIAGNOSIS — M25652 Stiffness of left hip, not elsewhere classified: Secondary | ICD-10-CM

## 2020-02-29 DIAGNOSIS — R29898 Other symptoms and signs involving the musculoskeletal system: Secondary | ICD-10-CM

## 2020-02-29 NOTE — Therapy (Signed)
[facility] Outpatient Rehabilitation [facility] [address] [place], [place], [address] Phone: [phone]   Fax:  [phone]  Physical Therapy Treatment  Patient Details  Name: Brenda Ortiz MRN: [id number] Date of Birth: 1941/03/13 Referring Provider (PT): Cristie Hem, [place]   Encounter Date: 02/29/2020  PT End of Session - 02/29/20 1622    Visit Number  2    Number of Visits  17    Date for PT Re-Evaluation  04/19/20    PT Start Time  0330    PT Stop Time  0429    PT Time Calculation (min)  59 min    Activity Tolerance  Patient tolerated treatment well    Behavior During Therapy  [facility] for tasks assessed/performed       Past Medical History:  Diagnosis Date  . Arthritis   . Hypothyroidism   . Incontinence   . Seasonal allergies     Past Surgical History:  Procedure Laterality Date  . DILATION AND CURETTAGE OF UTERUS     x2  . FOOT SURGERY Right   . HYSTEROSCOPY WITH D & C N/A 08/14/2016   Procedure: DILATATION AND CURETTAGE /HYSTEROSCOPY;  Surgeon: Olivia Mackie, MD;  Location: [facility];  Service: Gynecology;  Laterality: N/A;  . TONSILLECTOMY    . TOTAL HIP ARTHROPLASTY Left 02/03/2020   Procedure: LEFT TOTAL HIP ARTHROPLASTY ANTERIOR APPROACH;  Surgeon: Kathryne Hitch, MD;  Location: [facility];  Service: Orthopedics;  Laterality: Left;    There were no vitals filed for this visit.  Subjective Assessment - 02/29/20 1531    Subjective  I went on a walk yesterday outside 1/2 a block using my cane; walked up the stairs yesterday and today. No pain but my left hip is a little sore in the front.                       [facility] Adult PT Treatment/Exercise - 02/29/20 0001      Knee/Hip Exercises: Standing   Other Standing Knee Exercises  standing hamstring curls unilat bil x 15 at hi/lo bed at highest position; hip circles clockwise x 20 unilat bil; 2 step hip abduction unilat bil x 15; squats x 10 with hands on hi/lo table;  standing lateral walk holding onto hi/low table x 6 ft each direction x 2 bouts      Knee/Hip Exercises: Supine   Other Supine Knee/Hip Exercises  attempted SLR with quad set but too difficult for patient x 6; attempted SLR AAROM x 3 but still uncomfortable and therefore D/C; glute set with clam shell x 20; glute sett/quad set/SAQ combo x 20; SAQ x 20 with 5 sec hold; L small AROM hip abdct/addct with leg on bolster x 20; hamstring set with knees on bolster x 20 with 5 sec hold;       Modalities   Modalities  Cryotherapy      Cryotherapy   Number Minutes Cryotherapy  --   8 min   Cryotherapy Location  --   L hip supine              PT Short Term Goals - 02/29/20 1631      PT SHORT TERM GOAL #1   Title  Patient will be able to independently initiate HEP    Baseline  Patient received HEP on 02/23/2020    Time  4    Period  Weeks    Status  On-going    Target  Date  03/22/20      PT SHORT TERM GOAL #2   Title  Patient will be able to demonstrate and verbalize appropriate posture    Baseline  Forward head, decreased lumbar lordosis, Rounded shoulders    Time  4    Status  On-going    Target Date  03/22/20      PT SHORT TERM GOAL #3   Title  Patient will demonstrate an increased heel strike during initial contact in order to ambulate without an assistive device    Baseline  Using walker    Time  4    Period  Weeks    Status  On-going    Target Date  03/22/20        PT Long Term Goals - 02/23/20 1301      PT LONG TERM GOAL #1   Title  Patient will demsotrate 4+/5 gross bilateral hip strength in order to walk without pain    Baseline  4/5    Time  8    Period  Weeks    Status  New    Target Date  04/19/20      PT LONG TERM GOAL #2   Title  Patient will increase Lt. hip flexion and extension by >/= 10 degrees in order to navigate stairs in her home    Baseline  Lt. hip flexion: 64 Lt. hip extension: -2    Time  8    Period  Weeks    Status  New    Target Date   04/19/20      PT LONG TERM GOAL #3   Title  Patient will demonstrate 4/5 Lt. hip extension and abduction in order to get back to exercising independently per the patient's goals.    Baseline  extension: 3+/5,  abduction: 3+/5    Time  8    Period  Weeks    Status  New    Target Date  04/19/20      PT LONG TERM GOAL #4   Title  Patient will be independent with workout program to promote hip strength and stability.    Time  8    Period  Weeks    Target Date  04/19/20            Plan - 02/29/20 1622    Clinical Impression Statement  Pt presents to PT with slight L hip soreness status post increased home activities of walking outdoors and performing steps. Soreness located L anterior hip. PT monitored all activities to make sure no increased discomfort and if some reported, exercise was adjusted. Pt with L hip stiffness but proper muscle activation with tactile and verbal cueing during exercise. Pt would benefit from further PT for LE strengthening, balance, transfer and gait training to increase function and decrease soreness with functional mobility.    PT Frequency  2x / week    PT Duration  8 weeks    PT Treatment/Interventions  ADLs/Self Care Home Management;Cryotherapy;Electrical Stimulation;Iontophoresis 4mg /ml Dexamethasone;Moist Heat;Traction;Ultrasound;Gait training;Stair training;Functional mobility training;Therapeutic activities;Therapeutic exercise;Balance training;Neuromuscular re-education;Cognitive remediation;Patient/family education;Manual techniques;Compression bandaging;Scar mobilization;Passive range of motion;Dry needling;Energy conservation;Splinting;Visual/perceptual remediation/compensation;Joint Manipulations    PT Next Visit Plan  progress standing exercise in WB and for AROM/strengthening, SLR    PT Home Exercise Plan  Clams, SLR, Hip Flexor Stretch, Isometric Adduction w/ glute bridge    Consulted and Agree with Plan of Care  Patient       Patient will  benefit from skilled therapeutic intervention in  order to improve the following deficits and impairments:  Abnormal gait, Decreased balance, Decreased coordination, Decreased mobility, Decreased range of motion, Decreased scar mobility, Decreased strength, Difficulty walking, Increased fascial restricitons, Impaired flexibility, Improper body mechanics, Postural dysfunction, Pain  Visit Diagnosis: Hip pain  Stiffness of left hip, not elsewhere classified  Weakness of both hips     Problem List Patient Active Problem List   Diagnosis Date Noted  . Status post total replacement of left hip 02/03/2020  . Unilateral primary osteoarthritis, left hip 12/01/2019  . Low back pain 10/27/2019  . Left hip pain 10/27/2019  . Right hip pain 07/14/2017  . Chest pain 06/11/2015  . Hypothyroidism 06/11/2015    Loyed Wilmes 02/29/2020, 4:38 PM  [facility] [address] [place], [place], [address] Phone: [phone]   Fax:  [phone]  Name: Brenda Ortiz MRN: [id number] Date of Birth: [birth year]

## 2020-03-02 ENCOUNTER — Ambulatory Visit: Payer: Medicare PPO | Admitting: Physical Therapy

## 2020-03-02 ENCOUNTER — Other Ambulatory Visit: Payer: Self-pay

## 2020-03-02 ENCOUNTER — Encounter: Payer: Self-pay | Admitting: Physical Therapy

## 2020-03-02 DIAGNOSIS — M25559 Pain in unspecified hip: Secondary | ICD-10-CM | POA: Diagnosis not present

## 2020-03-02 DIAGNOSIS — R29898 Other symptoms and signs involving the musculoskeletal system: Secondary | ICD-10-CM

## 2020-03-02 DIAGNOSIS — M25652 Stiffness of left hip, not elsewhere classified: Secondary | ICD-10-CM

## 2020-03-02 NOTE — Therapy (Signed)
[facility] Outpatient Rehabilitation [facility] [address] [place], [place], [address] Phone: [phone]   Fax:  [phone]  Physical Therapy Treatment  Patient Details  Name: Brenda Ortiz MRN: [id number] Date of Birth: 01/02/1941 Referring Provider (PT): Cristie Hem, [place]   Encounter Date: 03/02/2020  PT End of Session - 03/02/20 1228    Visit Number  3    Number of Visits  17    Date for PT Re-Evaluation  04/19/20    PT Start Time  1103    PT Stop Time  1153    PT Time Calculation (min)  50 min       Past Medical History:  Diagnosis Date  . Arthritis   . Hypothyroidism   . Incontinence   . Seasonal allergies     Past Surgical History:  Procedure Laterality Date  . DILATION AND CURETTAGE OF UTERUS     x2  . FOOT SURGERY Right   . HYSTEROSCOPY WITH D & C N/A 08/14/2016   Procedure: DILATATION AND CURETTAGE /HYSTEROSCOPY;  Surgeon: Olivia Mackie, MD;  Location: [facility];  Service: Gynecology;  Laterality: N/A;  . TONSILLECTOMY    . TOTAL HIP ARTHROPLASTY Left 02/03/2020   Procedure: LEFT TOTAL HIP ARTHROPLASTY ANTERIOR APPROACH;  Surgeon: Kathryne Hitch, MD;  Location: [facility];  Service: Orthopedics;  Laterality: Left;    There were no vitals filed for this visit.  Subjective Assessment - 03/02/20 1105    Currently in Pain?  Yes    Pain Score  2    transition from sit-stand   Pain Location  Hip    Pain Orientation  Left    Pain Descriptors / Indicators  --   groan   Pain Type  Surgical pain    Aggravating Factors   sit-stand    Pain Relieving Factors  meds, ice                       [facility] Adult PT Treatment/Exercise - 03/02/20 0001      Ambulation/Gait   Ambulation/Gait  Yes    Ambulation/Gait Assistance  6: Modified independent (Device/Increase time)    Ambulation Distance (Feet)  200 Feet    Assistive device  Straight cane    Gait Comments  cues for equal step length and heel strike , needs cues  for sequencing of cane       Knee/Hip Exercises: Stretches   Hip Flexor Stretch  Left;1 rep;30 seconds      Knee/Hip Exercises: Standing   Other Standing Knee Exercises  side stepping at high table x 4 , h/s curls x 15 each , 3 way hip at hi table       Knee/Hip Exercises: Seated   Sit to Sand  10 reps   needs foam pad + pillow for height     Knee/Hip Exercises: Supine   Quad Sets  10 reps    Short Arc Quad Sets  20 reps    Heel Slides  15 reps    Straight Leg Raises  AROM;Left;1 set;5 reps    Straight Leg Raises Limitations  needs strap assist -still painful       Cryotherapy   Number Minutes Cryotherapy  10 Minutes    Cryotherapy Location  Hip    Type of Cryotherapy  Ice pack               PT Short Term Goals - 02/29/20 1631  PT SHORT TERM GOAL #1   Title  Patient will be able to independently initiate HEP    Baseline  Patient received HEP on 02/23/2020    Time  4    Period  Weeks    Status  On-going    Target Date  03/22/20      PT SHORT TERM GOAL #2   Title  Patient will be able to demonstrate and verbalize appropriate posture    Baseline  Forward head, decreased lumbar lordosis, Rounded shoulders    Time  4    Status  On-going    Target Date  03/22/20      PT SHORT TERM GOAL #3   Title  Patient will demonstrate an increased heel strike during initial contact in order to ambulate without an assistive device    Baseline  Using walker    Time  4    Period  Weeks    Status  On-going    Target Date  03/22/20        PT Long Term Goals - 02/23/20 1301      PT LONG TERM GOAL #1   Title  Patient will demsotrate 4+/5 gross bilateral hip strength in order to walk without pain    Baseline  4/5    Time  8    Period  Weeks    Status  New    Target Date  04/19/20      PT LONG TERM GOAL #2   Title  Patient will increase Lt. hip flexion and extension by >/= 10 degrees in order to navigate stairs in her home    Baseline  Lt. hip flexion: 64 Lt. hip  extension: -2    Time  8    Period  Weeks    Status  New    Target Date  04/19/20      PT LONG TERM GOAL #3   Title  Patient will demonstrate 4/5 Lt. hip extension and abduction in order to get back to exercising independently per the patient's goals.    Baseline  extension: 3+/5,  abduction: 3+/5    Time  8    Period  Weeks    Status  New    Target Date  04/19/20      PT LONG TERM GOAL #4   Title  Patient will be independent with workout program to promote hip strength and stability.    Time  8    Period  Weeks    Target Date  04/19/20            Plan - 03/02/20 1226    Clinical Impression Statement  Pt arrives with RW and SPC. Gait in clinic with [facility] and cues to decrease gait deviations, good carryover. Most difficulty with sit-stands. Used elevated mat to strengthen sit-stand. Continued difficutly with assisted SLR. Continued with heel slides and quad strength. Ice post session.    PT Next Visit Plan  progress standing exercise in WB and for AROM/strengthening, SLR    PT Home Exercise Plan  Clams, SLR, Hip Flexor Stretch, Isometric Adduction w/ glute bridge       Patient will benefit from skilled therapeutic intervention in order to improve the following deficits and impairments:  Abnormal gait, Decreased balance, Decreased coordination, Decreased mobility, Decreased range of motion, Decreased scar mobility, Decreased strength, Difficulty walking, Increased fascial restricitons, Impaired flexibility, Improper body mechanics, Postural dysfunction, Pain  Visit Diagnosis: Hip pain  Stiffness of left hip, not elsewhere classified  Weakness of both hips     Problem List Patient Active Problem List   Diagnosis Date Noted  . Status post total replacement of left hip 02/03/2020  . Unilateral primary osteoarthritis, left hip 12/01/2019  . Low back pain 10/27/2019  . Left hip pain 10/27/2019  . Right hip pain 07/14/2017  . Chest pain 06/11/2015  . Hypothyroidism  06/11/2015    Sherrie Mustache, PTA 03/02/2020, 12:29 PM  [facility] [address] [place], [place], [address] Phone: [phone]   Fax:  [phone]  Name: Brenda Ortiz MRN: [id number] Date of Birth: 1941-08-25

## 2020-03-07 ENCOUNTER — Other Ambulatory Visit: Payer: Self-pay

## 2020-03-07 ENCOUNTER — Encounter: Payer: Self-pay | Admitting: Physical Therapy

## 2020-03-07 ENCOUNTER — Ambulatory Visit: Payer: Medicare PPO | Admitting: Physical Therapy

## 2020-03-07 DIAGNOSIS — M25559 Pain in unspecified hip: Secondary | ICD-10-CM | POA: Diagnosis not present

## 2020-03-07 DIAGNOSIS — M25652 Stiffness of left hip, not elsewhere classified: Secondary | ICD-10-CM

## 2020-03-07 DIAGNOSIS — R29898 Other symptoms and signs involving the musculoskeletal system: Secondary | ICD-10-CM

## 2020-03-07 NOTE — Therapy (Signed)
[facility] Outpatient Rehabilitation [facility] [address] [place], [place], [address] Phone: [phone]   Fax:  [phone]  Physical Therapy Treatment  Patient Details  Name: Brenda Ortiz MRN: [id number] Date of Birth: 21-Dec-1940 Referring Provider (PT): Cristie Hem, [place]   Encounter Date: 03/07/2020  PT End of Session - 03/07/20 0949    Visit Number  4    Number of Visits  17    Date for PT Re-Evaluation  04/19/20    PT Start Time  0930    PT Stop Time  1025    PT Time Calculation (min)  55 min       Past Medical History:  Diagnosis Date  . Arthritis   . Hypothyroidism   . Incontinence   . Seasonal allergies     Past Surgical History:  Procedure Laterality Date  . DILATION AND CURETTAGE OF UTERUS     x2  . FOOT SURGERY Right   . HYSTEROSCOPY WITH D & C N/A 08/14/2016   Procedure: DILATATION AND CURETTAGE /HYSTEROSCOPY;  Surgeon: Olivia Mackie, MD;  Location: [facility];  Service: Gynecology;  Laterality: N/A;  . TONSILLECTOMY    . TOTAL HIP ARTHROPLASTY Left 02/03/2020   Procedure: LEFT TOTAL HIP ARTHROPLASTY ANTERIOR APPROACH;  Surgeon: Kathryne Hitch, MD;  Location: [facility];  Service: Orthopedics;  Laterality: Left;    There were no vitals filed for this visit.  Subjective Assessment - 03/07/20 0936    Subjective  I've been having spasms in front of thigh at night and it still hurts with first standing up, its sharp and I have to wait before I can take a step    Currently in Pain?  Yes    Pain Score  2     Pain Location  Hip    Pain Orientation  Left    Pain Descriptors / Indicators  Sharp    Aggravating Factors   sit-stand    Pain Relieving Factors  meds, ice                       [facility] Adult PT Treatment/Exercise - 03/07/20 0001      Ambulation/Gait   Pre-Gait Activities  gait in parallel bars with  1 UE light touch       Knee/Hip Exercises: Stretches   Hip Flexor Stretch Limitations  standing x 3  with RW       Knee/Hip Exercises: Aerobic   Nustep  L3 LEonly x 5 minutes       Knee/Hip Exercises: Standing   Hip Flexion Limitations  Marching, light touch in parallel bars     Hip Abduction  10 reps    Abduction Limitations  bilat, light touch    Hip Extension  10 reps    Extension Limitations  bilat light touch    SLS  10 sec with UE touch     SLS with Vectors  in parallel bars, bilat x 15 each cues for control    Other Standing Knee Exercises  side stepping , retro stepping , forward gait with one UE in paralle bars       Knee/Hip Exercises: Seated   Sit to Sand  10 reps   Hi/Lo table- raised - no UE     Cryotherapy   Number Minutes Cryotherapy  10 Minutes    Cryotherapy Location  Hip    Type of Cryotherapy  Ice pack  PT Short Term Goals - 02/29/20 1631      PT SHORT TERM GOAL #1   Title  Patient will be able to independently initiate HEP    Baseline  Patient received HEP on 02/23/2020    Time  4    Period  Weeks    Status  On-going    Target Date  03/22/20      PT SHORT TERM GOAL #2   Title  Patient will be able to demonstrate and verbalize appropriate posture    Baseline  Forward head, decreased lumbar lordosis, Rounded shoulders    Time  4    Status  On-going    Target Date  03/22/20      PT SHORT TERM GOAL #3   Title  Patient will demonstrate an increased heel strike during initial contact in order to ambulate without an assistive device    Baseline  Using walker    Time  4    Period  Weeks    Status  On-going    Target Date  03/22/20        PT Long Term Goals - 02/23/20 1301      PT LONG TERM GOAL #1   Title  Patient will demsotrate 4+/5 gross bilateral hip strength in order to walk without pain    Baseline  4/5    Time  8    Period  Weeks    Status  New    Target Date  04/19/20      PT LONG TERM GOAL #2   Title  Patient will increase Lt. hip flexion and extension by >/= 10 degrees in order to navigate stairs in her home     Baseline  Lt. hip flexion: 64 Lt. hip extension: -2    Time  8    Period  Weeks    Status  New    Target Date  04/19/20      PT LONG TERM GOAL #3   Title  Patient will demonstrate 4/5 Lt. hip extension and abduction in order to get back to exercising independently per the patient's goals.    Baseline  extension: 3+/5,  abduction: 3+/5    Time  8    Period  Weeks    Status  New    Target Date  04/19/20      PT LONG TERM GOAL #4   Title  Patient will be independent with workout program to promote hip strength and stability.    Time  8    Period  Weeks    Target Date  04/19/20            Plan - 03/07/20 1119    Clinical Impression Statement  Pt arrives with RW and  brings SPC. Worked in parallel bars with light touch for forward, side and retro stepping. She is concerned about her spasms at night, Encouraged her to massage stretch prior to bed. Reviewed standing and supine hip flexor stretches. Ice at end of session.    PT Next Visit Plan  progress standing exercise in WB and for AROM/strengthening, SLR    PT Home Exercise Plan  Clams, SLR, Hip Flexor Stretch, Isometric Adduction w/ glute bridge       Patient will benefit from skilled therapeutic intervention in order to improve the following deficits and impairments:  Abnormal gait, Decreased balance, Decreased coordination, Decreased mobility, Decreased range of motion, Decreased scar mobility, Decreased strength, Difficulty walking, Increased fascial restricitons, Impaired flexibility, Improper body  mechanics, Postural dysfunction, Pain  Visit Diagnosis: Hip pain  Stiffness of left hip, not elsewhere classified  Weakness of both hips     Problem List Patient Active Problem List   Diagnosis Date Noted  . Status post total replacement of left hip 02/03/2020  . Unilateral primary osteoarthritis, left hip 12/01/2019  . Low back pain 10/27/2019  . Left hip pain 10/27/2019  . Right hip pain 07/14/2017  . Chest pain  06/11/2015  . Hypothyroidism 06/11/2015    Brenda Ortiz, PTA 03/07/2020, 11:27 AM  [facility] [address] [place], [place], [address] Phone: [phone]   Fax:  [phone]  Name: Brenda Ortiz MRN: [id number] Date of Birth: 1941-04-07

## 2020-03-09 ENCOUNTER — Encounter: Payer: Self-pay | Admitting: Physical Therapy

## 2020-03-09 ENCOUNTER — Other Ambulatory Visit: Payer: Self-pay

## 2020-03-09 ENCOUNTER — Ambulatory Visit: Payer: Medicare PPO | Admitting: Physical Therapy

## 2020-03-09 DIAGNOSIS — M25652 Stiffness of left hip, not elsewhere classified: Secondary | ICD-10-CM

## 2020-03-09 DIAGNOSIS — M25559 Pain in unspecified hip: Secondary | ICD-10-CM | POA: Diagnosis not present

## 2020-03-09 DIAGNOSIS — R29898 Other symptoms and signs involving the musculoskeletal system: Secondary | ICD-10-CM

## 2020-03-09 NOTE — Therapy (Signed)
[facility] [address] [place], [place], [address] Phone: [phone]   Fax:  [phone]  Physical Therapy Treatment  Patient Details  Name: Brenda Ortiz MRN: [id number] Date of Birth: 25-Nov-1940 Referring Provider (PT): Cristie Hem, [place]   Encounter Date: 03/09/2020  PT End of Session - 03/09/20 1020    Visit Number  5    Number of Visits  17    Date for PT Re-Evaluation  04/19/20    PT Start Time  1015    PT Stop Time  1110    PT Time Calculation (min)  55 min       Past Medical History:  Diagnosis Date  . Arthritis   . Hypothyroidism   . Incontinence   . Seasonal allergies     Past Surgical History:  Procedure Laterality Date  . DILATION AND CURETTAGE OF UTERUS     x2  . FOOT SURGERY Right   . HYSTEROSCOPY WITH D & C N/A 08/14/2016   Procedure: DILATATION AND CURETTAGE /HYSTEROSCOPY;  Surgeon: Olivia Mackie, MD;  Location: [facility];  Service: Gynecology;  Laterality: N/A;  . TONSILLECTOMY    . TOTAL HIP ARTHROPLASTY Left 02/03/2020   Procedure: LEFT TOTAL HIP ARTHROPLASTY ANTERIOR APPROACH;  Surgeon: Kathryne Hitch, MD;  Location: [facility];  Service: Orthopedics;  Laterality: Left;    There were no vitals filed for this visit.  Subjective Assessment - 03/09/20 1018    Subjective  I am having a pain at my hip. My husband says I did too much. I did 5 minutes on by upright bike.    Currently in Pain?  Yes    Pain Score  3     Pain Location  Hip    Pain Orientation  Left    Pain Descriptors / Indicators  Sharp    Pain Type  Surgical pain    Aggravating Factors   getting up, prolonged sitting and then get up    Pain Relieving Factors  meds,ice                       [facility] Adult PT Treatment/Exercise - 03/09/20 0001      Knee/Hip Exercises: Stretches   Hip Flexor Stretch Limitations  standing x 3 with RW       Knee/Hip Exercises: Aerobic   Nustep  L3 LEonly x 6 minutes        Knee/Hip Exercises: Standing   Hip Flexion Limitations  Marching, light touch in parallel bars     Hip Abduction  10 reps    Abduction Limitations  bilat, light touch    Hip Extension  10 reps    Extension Limitations  bilat light touch    Gait Training  forward weight shifting, backward weight shifting, lateral weight shifting, forward gait in parallel bars focuing on roll thorugh, heel strike, knee flexion , also with SPC      Knee/Hip Exercises: Supine   Quad Sets Limitations  10 glute/quad sets     Heel Slides  15 reps    Heel Slides Limitations  with ER    Straight Leg Raises  5 reps    Straight Leg Raises Limitations  difficult    Straight Leg Raise with External Rotation  5 reps    Straight Leg Raise with External Rotation Limitations  difficult      Cryotherapy   Number Minutes Cryotherapy  10 Minutes    Cryotherapy Location  Hip    Type of Cryotherapy  Ice pack      Manual Therapy   Manual therapy comments  IASTM at left proximal hip and around scar                PT Short Term Goals - 02/29/20 1631      PT SHORT TERM GOAL #1   Title  Patient will be able to independently initiate HEP    Baseline  Patient received HEP on 02/23/2020    Time  4    Period  Weeks    Status  On-going    Target Date  03/22/20      PT SHORT TERM GOAL #2   Title  Patient will be able to demonstrate and verbalize appropriate posture    Baseline  Forward head, decreased lumbar lordosis, Rounded shoulders    Time  4    Status  On-going    Target Date  03/22/20      PT SHORT TERM GOAL #3   Title  Patient will demonstrate an increased heel strike during initial contact in order to ambulate without an assistive device    Baseline  Using walker    Time  4    Period  Weeks    Status  On-going    Target Date  03/22/20        PT Long Term Goals - 02/23/20 1301      PT LONG TERM GOAL #1   Title  Patient will demsotrate 4+/5 gross bilateral hip strength in order to walk  without pain    Baseline  4/5    Time  8    Period  Weeks    Status  New    Target Date  04/19/20      PT LONG TERM GOAL #2   Title  Patient will increase Lt. hip flexion and extension by >/= 10 degrees in order to navigate stairs in her home    Baseline  Lt. hip flexion: 64 Lt. hip extension: -2    Time  8    Period  Weeks    Status  New    Target Date  04/19/20      PT LONG TERM GOAL #3   Title  Patient will demonstrate 4/5 Lt. hip extension and abduction in order to get back to exercising independently per the patient's goals.    Baseline  extension: 3+/5,  abduction: 3+/5    Time  8    Period  Weeks    Status  New    Target Date  04/19/20      PT LONG TERM GOAL #4   Title  Patient will be independent with workout program to promote hip strength and stability.    Time  8    Period  Weeks    Target Date  04/19/20            Plan - 03/09/20 1108    Clinical Impression Statement  Increased pain possibly after starting upright bike at home for 5 minutes. Continued with hip strengthening as tolerated and gait training with cane. Still has difficulty with SLR due to weakness and pain.    PT Next Visit Plan  progress standing exercise in WB and for AROM/strengthening, SLR    PT Home Exercise Plan  Clams, SLR, Hip Flexor Stretch, Isometric Adduction w/ glute bridge       Patient will benefit from skilled therapeutic intervention in order to improve the following  deficits and impairments:  Abnormal gait, Decreased balance, Decreased coordination, Decreased mobility, Decreased range of motion, Decreased scar mobility, Decreased strength, Difficulty walking, Increased fascial restricitons, Impaired flexibility, Improper body mechanics, Postural dysfunction, Pain  Visit Diagnosis: Hip pain  Stiffness of left hip, not elsewhere classified  Weakness of both hips     Problem List Patient Active Problem List   Diagnosis Date Noted  . Status post total replacement of left  hip 02/03/2020  . Unilateral primary osteoarthritis, left hip 12/01/2019  . Low back pain 10/27/2019  . Left hip pain 10/27/2019  . Right hip pain 07/14/2017  . Chest pain 06/11/2015  . Hypothyroidism 06/11/2015    Sherrie Mustache, PTA 03/09/2020, 11:10 AM  [facility] [address] [place], [place], [address] Phone: [phone]   Fax:  [phone]  Name: Brenda Ortiz MRN: [id number] Date of Birth: [birth year]

## 2020-03-12 ENCOUNTER — Other Ambulatory Visit: Payer: Self-pay

## 2020-03-12 ENCOUNTER — Ambulatory Visit: Payer: Medicare PPO | Admitting: Physical Therapy

## 2020-03-12 ENCOUNTER — Encounter: Payer: Self-pay | Admitting: Physical Therapy

## 2020-03-12 DIAGNOSIS — M25652 Stiffness of left hip, not elsewhere classified: Secondary | ICD-10-CM

## 2020-03-12 DIAGNOSIS — M25559 Pain in unspecified hip: Secondary | ICD-10-CM

## 2020-03-12 DIAGNOSIS — R29898 Other symptoms and signs involving the musculoskeletal system: Secondary | ICD-10-CM

## 2020-03-12 NOTE — Therapy (Signed)
[place], [place], [address] Phone: [phone]   Fax:  [phone]  Physical Therapy Treatment  Patient Details  Name: Brenda Ortiz MRN: [id number] Date of Birth: [birth year] Referring Provider (PT): Aundra Dubin, [place]   Encounter Date: 03/12/2020  PT End of Session - 03/12/20 1418    Visit Number  6    Number of Visits  17    Date for PT Re-Evaluation  04/19/20    Progress Note Due on Visit  10    PT Start Time  2683    PT Stop Time  1506    PT Time Calculation (min)  51 min    Activity Tolerance  Patient tolerated treatment well    Behavior During Therapy  [facility] for tasks assessed/performed       Past Medical History:  Diagnosis Date  . Arthritis   . Hypothyroidism   . Incontinence   . Seasonal allergies     Past Surgical History:  Procedure Laterality Date  . DILATION AND CURETTAGE OF UTERUS     x2  . FOOT SURGERY Right   . HYSTEROSCOPY WITH D & C N/A 08/14/2016   Procedure: DILATATION AND CURETTAGE /HYSTEROSCOPY;  Surgeon: Brien Few, MD;  Location: [facility];  Service: Gynecology;  Laterality: N/A;  . TONSILLECTOMY    . TOTAL HIP ARTHROPLASTY Left 02/03/2020   Procedure: LEFT TOTAL HIP ARTHROPLASTY ANTERIOR APPROACH;  Surgeon: Mcarthur Rossetti, MD;  Location: [facility];  Service: Orthopedics;  Laterality: Left;    There were no vitals filed for this visit.  Subjective Assessment - 03/12/20 1421    Subjective  "I have alittle pain with standing from a seated positiong, and the longer I sit the more sore I get"    Pain Score  2     Pain Location  Hip    Pain Orientation  Right         [facility] PT Assessment - 03/12/20 0001      Assessment   Medical Diagnosis  Status post total hip replacement    Referring Provider (PT)  Aundra Dubin, PA-C    Onset Date/Surgical Date  02/03/20                   [facility] Adult PT Treatment/Exercise - 03/12/20 0001      Self-Care   Self-Care  Other Self-Care Comments    Other Self-Care Comments   performing trigger point release and using dough roller to release tension.      Knee/Hip Exercises: Stretches   Hip Flexor Stretch  2 reps;Left   supine   Hip Flexor Stretch Limitations  standing with SPC 2 x 30 sec    Other Knee/Hip Stretches  modified warrior pose 3 x 30 sec with bil HHA from tall table for stability.      Knee/Hip Exercises: Aerobic   Recumbent Bike  L3 x 5 min      Knee/Hip Exercises: Standing   Gait Training  forward/ backward walking with exaggerated heel strike/ toe off using RUE only mimicking SPC. 2 x 5 ea.      Modalities   Modalities  Moist Heat      Moist Heat Therapy   Number Minutes Moist Heat  10 Minutes    Moist Heat Location  Hip   in modified thomas hip stretch position      Manual Therapy   Manual Therapy  Soft tissue mobilization  Manual therapy comments  MTPR along the rectus femoris working distal to proximal x 4    Soft tissue mobilization  DTM and I ASTM along the rectus femoris , and pectineus               PT Short Term Goals - 02/29/20 1631      PT SHORT TERM GOAL #1   Title  Patient will be able to independently initiate HEP    Baseline  Patient received HEP on 02/23/2020    Time  4    Period  Weeks    Status  On-going    Target Date  03/22/20      PT SHORT TERM GOAL #2   Title  Patient will be able to demonstrate and verbalize appropriate posture    Baseline  Forward head, decreased lumbar lordosis, Rounded shoulders    Time  4    Status  On-going    Target Date  03/22/20      PT SHORT TERM GOAL #3   Title  Patient will demonstrate an increased heel strike during initial contact in order to ambulate without an assistive device    Baseline  Using walker    Time  4    Period  Weeks    Status  On-going    Target Date  03/22/20        PT Long Term Goals - 02/23/20 1301      PT LONG TERM GOAL #1   Title  Patient will demsotrate  4+/5 gross bilateral hip strength in order to walk without pain    Baseline  4/5    Time  8    Period  Weeks    Status  New    Target Date  04/19/20      PT LONG TERM GOAL #2   Title  Patient will increase Lt. hip flexion and extension by >/= 10 degrees in order to navigate stairs in her home    Baseline  Lt. hip flexion: 64 Lt. hip extension: -2    Time  8    Period  Weeks    Status  New    Target Date  04/19/20      PT LONG TERM GOAL #3   Title  Patient will demonstrate 4/5 Lt. hip extension and abduction in order to get back to exercising independently per the patient's goals.    Baseline  extension: 3+/5,  abduction: 3+/5    Time  8    Period  Weeks    Status  New    Target Date  04/19/20      PT LONG TERM GOAL #4   Title  Patient will be independent with workout program to promote hip strength and stability.    Time  8    Period  Weeks    Target Date  04/19/20            Plan - 03/12/20 1522    Clinical Impression Statement  pt arrives ambulating with SPC reporting 2/10 pain located along the proximal rectus femoris and pectineus. She responded well hip flexor and pectineus stretching, focused session manual techniques to reduce hip flexor soreness, and gait training using // which she responded well to. She reported 1/10 pain inthe L hip following MHP while in stretched position for the L hip flexor.    PT Treatment/Interventions  ADLs/Self Care Home Management;Cryotherapy;Electrical Stimulation;Iontophoresis 4mg /ml Dexamethasone;Moist Heat;Traction;Ultrasound;Gait training;Stair training;Functional mobility training;Therapeutic activities;Therapeutic exercise;Balance training;Neuromuscular re-education;Cognitive remediation;Patient/family education;Manual techniques;Compression  bandaging;Scar mobilization;Passive range of motion;Dry needling;Energy conservation;Splinting;Visual/perceptual remediation/compensation;Joint Manipulations    PT Next Visit Plan  progress  standing exercise in WB and for AROM/strengthening, SLR, STW along rectus and pectineus    PT Home Exercise Plan  Clams, SLR, Hip Flexor Stretch, Isometric Adduction w/ glute bridge    Consulted and Agree with Plan of Care  Patient       Patient will benefit from skilled therapeutic intervention in order to improve the following deficits and impairments:  Abnormal gait, Decreased balance, Decreased coordination, Decreased mobility, Decreased range of motion, Decreased scar mobility, Decreased strength, Difficulty walking, Increased fascial restricitons, Impaired flexibility, Improper body mechanics, Postural dysfunction, Pain  Visit Diagnosis: Hip pain  Stiffness of left hip, not elsewhere classified  Weakness of both hips     Problem List Patient Active Problem List   Diagnosis Date Noted  . Status post total replacement of left hip 02/03/2020  . Unilateral primary osteoarthritis, left hip 12/01/2019  . Low back pain 10/27/2019  . Left hip pain 10/27/2019  . Right hip pain 07/14/2017  . Chest pain 06/11/2015  . Hypothyroidism 06/11/2015   Lulu Riding PT, DPT, LAT, ATC  03/12/20  3:29 PM      [facility] [address] [place], [place], [address] Phone: [phone]   Fax:  [phone]  Name: Brenda Ortiz MRN: [id number] Date of Birth: [birth year]

## 2020-03-14 ENCOUNTER — Encounter: Payer: Self-pay | Admitting: Physical Therapy

## 2020-03-14 ENCOUNTER — Other Ambulatory Visit: Payer: Self-pay

## 2020-03-14 ENCOUNTER — Ambulatory Visit: Payer: Medicare PPO | Admitting: Physical Therapy

## 2020-03-14 DIAGNOSIS — M25559 Pain in unspecified hip: Secondary | ICD-10-CM | POA: Diagnosis not present

## 2020-03-14 DIAGNOSIS — M25652 Stiffness of left hip, not elsewhere classified: Secondary | ICD-10-CM

## 2020-03-14 DIAGNOSIS — R29898 Other symptoms and signs involving the musculoskeletal system: Secondary | ICD-10-CM

## 2020-03-14 NOTE — Therapy (Signed)
[facility] [address] [place], [place], [address] Phone: [phone]   Fax:  [phone]  Physical Therapy Treatment  Patient Details  Name: Brenda Ortiz MRN: [id number] Date of Birth: 1941/03/22 Referring Provider (PT): Cristie Hem, [place]   Encounter Date: 03/14/2020  PT End of Session - 03/14/20 1533    Visit Number  7    Number of Visits  17    Date for PT Re-Evaluation  04/19/20    PT Start Time  1415    PT Stop Time  1508    PT Time Calculation (min)  53 min    Activity Tolerance  Patient tolerated treatment well    Behavior During Therapy  [facility] for tasks assessed/performed       Past Medical History:  Diagnosis Date  . Arthritis   . Hypothyroidism   . Incontinence   . Seasonal allergies     Past Surgical History:  Procedure Laterality Date  . DILATION AND CURETTAGE OF UTERUS     x2  . FOOT SURGERY Right   . HYSTEROSCOPY WITH D & C N/A 08/14/2016   Procedure: DILATATION AND CURETTAGE /HYSTEROSCOPY;  Surgeon: Olivia Mackie, MD;  Location: [facility];  Service: Gynecology;  Laterality: N/A;  . TONSILLECTOMY    . TOTAL HIP ARTHROPLASTY Left 02/03/2020   Procedure: LEFT TOTAL HIP ARTHROPLASTY ANTERIOR APPROACH;  Surgeon: Kathryne Hitch, MD;  Location: [facility];  Service: Orthopedics;  Laterality: Left;    There were no vitals filed for this visit.  Subjective Assessment - 03/14/20 1420    Subjective  Patient reports the spasming has increased. Last night she had severe spasming that lasted from 11pm until 3AM. They are keeping her from being able to sleep. She is otherwise just sore. Her pain level today is a 3/10. She is carrying her cane coming into the clinc.    Limitations  Lifting;Walking;Standing;Other (comment)    How long can you sit comfortably?  unlimited    How long can you stand comfortably?  unlimited    How long can you walk comfortably?  unlimited    Patient Stated Goals  Wants to  be able to get up stairs and walking outside    Currently in Pain?  Yes    Pain Score  3     Pain Location  Hip    Pain Orientation  Right    Pain Descriptors / Indicators  Aching    Pain Type  Surgical pain    Pain Onset  More than a month ago    Pain Frequency  Intermittent    Aggravating Factors   night time    Pain Relieving Factors  positioning    Multiple Pain Sites  No                       [facility] Adult PT Treatment/Exercise - 03/14/20 0001      Knee/Hip Exercises: Aerobic   Nustep  without going past 90 degrees       Knee/Hip Exercises: Standing   Heel Raises Limitations  forward weight shifts with emphasis 2x10     Hip Abduction  10 reps    Abduction Limitations  bilat, light touch    Hip Extension  10 reps    Extension Limitations  bilat light touch      Manual Therapy   Manual Therapy  Soft tissue mobilization;Passive ROM    Manual therapy comments  MTPR along  the rectus femoris working distal to proximal x 4    Passive ROM  gentle hip extension stretch in sidelying; leg controlled              PT Education - 03/14/20 1529    Education Details  reviewed HEP and symptom mangement    Person(s) Educated  Patient    Methods  Demonstration;Explanation;Tactile cues;Verbal cues    Comprehension  Verbalized understanding;Returned demonstration;Verbal cues required;Tactile cues required       PT Short Term Goals - 03/14/20 1545      PT SHORT TERM GOAL #1   Title  Patient will be able to independently initiate HEP    Baseline  Patient received HEP on 02/23/2020    Time  4    Period  Weeks    Status  On-going    Target Date  03/22/20      PT SHORT TERM GOAL #2   Title  Patient will be able to demonstrate and verbalize appropriate posture    Baseline  Forward head, decreased lumbar lordosis, Rounded shoulders    Time  4    Period  Weeks    Status  On-going    Target Date  03/22/20      PT SHORT TERM GOAL #3   Title  Patient will  demonstrate an increased heel strike during initial contact in order to ambulate without an assistive device    Baseline  Using walker    Period  Weeks    Status  On-going    Target Date  03/22/20        PT Long Term Goals - 02/23/20 1301      PT LONG TERM GOAL #1   Title  Patient will demsotrate 4+/5 gross bilateral hip strength in order to walk without pain    Baseline  4/5    Time  8    Period  Weeks    Status  New    Target Date  04/19/20      PT LONG TERM GOAL #2   Title  Patient will increase Lt. hip flexion and extension by >/= 10 degrees in order to navigate stairs in her home    Baseline  Lt. hip flexion: 64 Lt. hip extension: -2    Time  8    Period  Weeks    Status  New    Target Date  04/19/20      PT LONG TERM GOAL #3   Title  Patient will demonstrate 4/5 Lt. hip extension and abduction in order to get back to exercising independently per the patient's goals.    Baseline  extension: 3+/5,  abduction: 3+/5    Time  8    Period  Weeks    Status  New    Target Date  04/19/20      PT LONG TERM GOAL #4   Title  Patient will be independent with workout program to promote hip strength and stability.    Time  8    Period  Weeks    Target Date  04/19/20            Plan - 03/14/20 1533    Clinical Impression Statement  Patient has focal swelling and tenderness along her scar. After manual therapy it was less hard and less sensative. Therapy tried controlled sidelying stretching with knee bent. She was not stretched past neutral but elft a good stretch. She had some pain with hip flexion. Her HEP was  modified to minimize hip flexion just for the weekend to see if this will help decrease her spasming. Therapy will re-assess Monday. She was interested in a really precise exercise program. Therapy advised her even with a precise exercise program she will need to follow her symptoms.    Personal Factors and Comorbidities  Age;Comorbidity 2    Comorbidities  Arthritis,  Hypothyroidism    Examination-Activity Limitations  Continence;Dressing;Lift;Locomotion Level;Squat;Stairs;Stand;Transfers    Examination-Participation Restrictions  Community Activity    Stability/Clinical Decision Making  Stable/Uncomplicated    Clinical Decision Making  Low    Rehab Potential  Good    PT Frequency  2x / week    PT Duration  8 weeks    PT Treatment/Interventions  ADLs/Self Care Home Management;Cryotherapy;Electrical Stimulation;Iontophoresis 4mg /ml Dexamethasone;Moist Heat;Traction;Ultrasound;Gait training;Stair training;Functional mobility training;Therapeutic activities;Therapeutic exercise;Balance training;Neuromuscular re-education;Cognitive remediation;Patient/family education;Manual techniques;Compression bandaging;Scar mobilization;Passive range of motion;Dry needling;Energy conservation;Splinting;Visual/perceptual remediation/compensation;Joint Manipulations    PT Next Visit Plan  progress standing exercise in WB and for AROM/strengthening, SLR, STW along rectus and pectineus    PT Home Exercise Plan  Clams, SLR, Hip Flexor Stretch, Isometric Adduction w/ glute bridge    Consulted and Agree with Plan of Care  Patient       Patient will benefit from skilled therapeutic intervention in order to improve the following deficits and impairments:  Abnormal gait, Decreased balance, Decreased coordination, Decreased mobility, Decreased range of motion, Decreased scar mobility, Decreased strength, Difficulty walking, Increased fascial restricitons, Impaired flexibility, Improper body mechanics, Postural dysfunction, Pain  Visit Diagnosis: Stiffness of left hip, not elsewhere classified  Hip pain  Weakness of both hips     Problem List Patient Active Problem List   Diagnosis Date Noted  . Status post total replacement of left hip 02/03/2020  . Unilateral primary osteoarthritis, left hip 12/01/2019  . Low back pain 10/27/2019  . Left hip pain 10/27/2019  . Right hip  pain 07/14/2017  . Chest pain 06/11/2015  . Hypothyroidism 06/11/2015    06/13/2015 PT DPT  03/14/2020, 3:49 PM  [facility] [address] [place], [place], [place] Phone: [phone]   Fax:  [phone]  Name: Brenda Ortiz MRN: [name] Date of Birth: 1941-03-05

## 2020-03-15 ENCOUNTER — Ambulatory Visit (INDEPENDENT_AMBULATORY_CARE_PROVIDER_SITE_OTHER): Payer: Medicare PPO | Admitting: Orthopaedic Surgery

## 2020-03-15 ENCOUNTER — Ambulatory Visit (INDEPENDENT_AMBULATORY_CARE_PROVIDER_SITE_OTHER): Payer: Medicare PPO

## 2020-03-15 ENCOUNTER — Encounter: Payer: Self-pay | Admitting: Orthopaedic Surgery

## 2020-03-15 DIAGNOSIS — Z96642 Presence of left artificial hip joint: Secondary | ICD-10-CM

## 2020-03-15 NOTE — Progress Notes (Signed)
The patient is 6 weeks status post a left total hip arthroplasty.  She is having some problems with hip pain when she first gets up and gets walking.  She has been very active with her therapy and riding her exercise bike.  She feels like last week she was doing well but she is not doing as well this week.  She is still using a cane when she ambulates.  She is only taken occasional pain medication.  I did watch her stand up and get walking and she is still having some pain when she first gets up.  I did put her left hip through internal/external rotation and there is only some mild pain.  There is pain to palpation of the trochanteric area.  We did x-ray her hip today since she is having pain in the pelvis view and lateral of her left hip shows a well-seated implant with no complicating features.  There is no change from her immediate postoperative films.  I gave her reassurance that everything looks good with her implants and I certainly was sympathetic to the pain she is having.  Hopefully with time she will continue to improve and get better.  I am encouraged that she will.  I am also encouraged by the fact that she is very active and is very motivated.  I would like to see her back in 4 weeks to see how she is doing overall.  All questions and concerns were answered and addressed.

## 2020-03-19 ENCOUNTER — Other Ambulatory Visit: Payer: Self-pay

## 2020-03-19 ENCOUNTER — Ambulatory Visit: Payer: Medicare PPO | Attending: Physician Assistant | Admitting: Physical Therapy

## 2020-03-19 DIAGNOSIS — R29898 Other symptoms and signs involving the musculoskeletal system: Secondary | ICD-10-CM

## 2020-03-19 DIAGNOSIS — M25559 Pain in unspecified hip: Secondary | ICD-10-CM | POA: Diagnosis present

## 2020-03-19 DIAGNOSIS — M25652 Stiffness of left hip, not elsewhere classified: Secondary | ICD-10-CM | POA: Insufficient documentation

## 2020-03-19 NOTE — Therapy (Signed)
[facility] [address] [place], [place], [address] Phone: [phone]   Fax:  [phone]  Physical Therapy Treatment  Patient Details  Name: Brenda Ortiz MRN: [id number] Date of Birth: December 27, 1940 Referring Provider (PT): Cristie Hem, [place]   Encounter Date: 03/19/2020  PT End of Session - 03/19/20 1426    Visit Number  8    Number of Visits  17    Date for PT Re-Evaluation  04/19/20    Progress Note Due on Visit  10    PT Start Time  1420    PT Stop Time  1500    PT Time Calculation (min)  40 min    Activity Tolerance  Patient tolerated treatment well    Behavior During Therapy  [facility] for tasks assessed/performed       Past Medical History:  Diagnosis Date  . Arthritis   . Hypothyroidism   . Incontinence   . Seasonal allergies     Past Surgical History:  Procedure Laterality Date  . DILATION AND CURETTAGE OF UTERUS     x2  . FOOT SURGERY Right   . HYSTEROSCOPY WITH D & C N/A 08/14/2016   Procedure: DILATATION AND CURETTAGE /HYSTEROSCOPY;  Surgeon: Olivia Mackie, MD;  Location: [facility];  Service: Gynecology;  Laterality: N/A;  . TONSILLECTOMY    . TOTAL HIP ARTHROPLASTY Left 02/03/2020   Procedure: LEFT TOTAL HIP ARTHROPLASTY ANTERIOR APPROACH;  Surgeon: Kathryne Hitch, MD;  Location: [facility];  Service: Orthopedics;  Laterality: Left;    There were no vitals filed for this visit.      [facility] PT Assessment - 03/19/20 0001      Assessment   Medical Diagnosis  Status post total hip replacement    Referring Provider (PT)  Cristie Hem, PA-C    Onset Date/Surgical Date  02/03/20                   [facility] Adult PT Treatment/Exercise - 03/19/20 0001      Therapeutic Activites    Therapeutic Activities  Other Therapeutic Activities    Other Therapeutic Activities  sit to stand taking steps on 3rd and 5th reps promoting confidence and reduce hesitancy.       Knee/Hip Exercises:  Stretches   Hip Flexor Stretch  2 reps;30 seconds      Knee/Hip Exercises: Aerobic   Recumbent Bike  L1 x 5 min       Knee/Hip Exercises: Seated   Marching  10 reps;2 sets   tactile cues for proper form   Sit to Sand  2 sets;5 reps   using 2 inch step on table to promote ease,      Knee/Hip Exercises: Supine   Quad Sets Limitations  glute sex 5 x 10     Straight Leg Raises  2 sets;10 reps    Other Supine Knee/Hip Exercises  hip flexion bil with heels on red physioball 2 x 15    Other Supine Knee/Hip Exercises  bent knee raise 2 x 15 with red phyisoball between      Manual Therapy   Manual Therapy  Joint mobilization    Joint Mobilization  grade I LAD for pain LLE    Soft tissue mobilization  DTM and I ASTM along the rectus femoris , and pectineus    Passive ROM  gentle hip extension stretch in sidelying; leg controlled  PT Short Term Goals - 03/14/20 1545      PT SHORT TERM GOAL #1   Title  Patient will be able to independently initiate HEP    Baseline  Patient received HEP on 02/23/2020    Time  4    Period  Weeks    Status  On-going    Target Date  03/22/20      PT SHORT TERM GOAL #2   Title  Patient will be able to demonstrate and verbalize appropriate posture    Baseline  Forward head, decreased lumbar lordosis, Rounded shoulders    Time  4    Period  Weeks    Status  On-going    Target Date  03/22/20      PT SHORT TERM GOAL #3   Title  Patient will demonstrate an increased heel strike during initial contact in order to ambulate without an assistive device    Baseline  Using walker    Period  Weeks    Status  On-going    Target Date  03/22/20        PT Long Term Goals - 02/23/20 1301      PT LONG TERM GOAL #1   Title  Patient will demsotrate 4+/5 gross bilateral hip strength in order to walk without pain    Baseline  4/5    Time  8    Period  Weeks    Status  New    Target Date  04/19/20      PT LONG TERM GOAL #2   Title   Patient will increase Lt. hip flexion and extension by >/= 10 degrees in order to navigate stairs in her home    Baseline  Lt. hip flexion: 64 Lt. hip extension: -2    Time  8    Period  Weeks    Status  New    Target Date  04/19/20      PT LONG TERM GOAL #3   Title  Patient will demonstrate 4/5 Lt. hip extension and abduction in order to get back to exercising independently per the patient's goals.    Baseline  extension: 3+/5,  abduction: 3+/5    Time  8    Period  Weeks    Status  New    Target Date  04/19/20      PT LONG TERM GOAL #4   Title  Patient will be independent with workout program to promote hip strength and stability.    Time  8    Period  Weeks    Target Date  04/19/20            Plan - 03/19/20 1448    Clinical Impression Statement  pt continues to report pain located in the proximal hip flexor/ TFL and pectineus region which improved with STW and stretching. continued working on hip flexor and glute activation using physioball to promote AAROM which she responded well to. reviewed pain and expectations following surgery due to pt's level of apprehension. She was able to perform sit to stand and take steps without hesitation/ apprehension with intermittent cues. no report of pain or stiffness at end of session.    PT Treatment/Interventions  ADLs/Self Care Home Management;Cryotherapy;Electrical Stimulation;Iontophoresis 4mg /ml Dexamethasone;Moist Heat;Traction;Ultrasound;Gait training;Stair training;Functional mobility training;Therapeutic activities;Therapeutic exercise;Balance training;Neuromuscular re-education;Cognitive remediation;Patient/family education;Manual techniques;Compression bandaging;Scar mobilization;Passive range of motion;Dry needling;Energy conservation;Splinting;Visual/perceptual remediation/compensation;Joint Manipulations    PT Next Visit Plan  progress standing exercise in WB and for AROM/strengthening, SLR, STW along rectus and  pectineus    PT  Home Exercise Plan  Clams, SLR, Hip Flexor Stretch, Isometric Adduction w/ glute bridge       Patient will benefit from skilled therapeutic intervention in order to improve the following deficits and impairments:  Abnormal gait, Decreased balance, Decreased coordination, Decreased mobility, Decreased range of motion, Decreased scar mobility, Decreased strength, Difficulty walking, Increased fascial restricitons, Impaired flexibility, Improper body mechanics, Postural dysfunction, Pain  Visit Diagnosis: Stiffness of left hip, not elsewhere classified  Hip pain  Weakness of both hips     Problem List Patient Active Problem List   Diagnosis Date Noted  . Status post total replacement of left hip 02/03/2020  . Unilateral primary osteoarthritis, left hip 12/01/2019  . Low back pain 10/27/2019  . Left hip pain 10/27/2019  . Right hip pain 07/14/2017  . Chest pain 06/11/2015  . Hypothyroidism 06/11/2015   Starr Lake PT, DPT, LAT, ATC  03/19/20  3:10 PM      [facility] [address] [place], [place], [address] Phone: [phone]   Fax:  [phone]  Name: Brenda Ortiz MRN: [id number] Date of Birth: 03/21/1941

## 2020-03-21 ENCOUNTER — Ambulatory Visit: Payer: Medicare PPO | Admitting: Physical Therapy

## 2020-03-21 ENCOUNTER — Other Ambulatory Visit: Payer: Self-pay

## 2020-03-21 ENCOUNTER — Encounter: Payer: Self-pay | Admitting: Physical Therapy

## 2020-03-21 DIAGNOSIS — R29898 Other symptoms and signs involving the musculoskeletal system: Secondary | ICD-10-CM

## 2020-03-21 DIAGNOSIS — M25652 Stiffness of left hip, not elsewhere classified: Secondary | ICD-10-CM

## 2020-03-21 DIAGNOSIS — M25559 Pain in unspecified hip: Secondary | ICD-10-CM

## 2020-03-21 NOTE — Therapy (Signed)
[facility] Outpatient Rehabilitation [facility] [address] [place], [place], [address] Phone: [phone]   Fax:  [phone]  Physical Therapy Treatment  Patient Details  Name: Brenda Ortiz MRN: [id number] Date of Birth: 1941/06/04 Referring Provider (PT): Brenda Ortiz, [place]   Encounter Date: 03/21/2020  PT End of Session - 03/21/20 1413    Visit Number  9    Number of Visits  17    Date for PT Re-Evaluation  04/19/20    Progress Note Due on Visit  10    PT Start Time  1413    Activity Tolerance  Patient tolerated treatment well    Behavior During Therapy  [facility] for tasks assessed/performed       Past Medical History:  Diagnosis Date  . Arthritis   . Hypothyroidism   . Incontinence   . Seasonal allergies     Past Surgical History:  Procedure Laterality Date  . DILATION AND CURETTAGE OF UTERUS     x2  . FOOT SURGERY Right   . HYSTEROSCOPY WITH D & C N/A 08/14/2016   Procedure: DILATATION AND CURETTAGE /HYSTEROSCOPY;  Surgeon: Brenda Mackie, MD;  Location: [facility];  Service: Gynecology;  Laterality: N/A;  . TONSILLECTOMY    . TOTAL HIP ARTHROPLASTY Left 02/03/2020   Procedure: LEFT TOTAL HIP ARTHROPLASTY ANTERIOR APPROACH;  Surgeon: Brenda Hitch, MD;  Location: [facility];  Service: Orthopedics;  Laterality: Left;    There were no vitals filed for this visit.  Subjective Assessment - 03/21/20 1418    Subjective  " I feel like I am doing better, pain / stiffness is about a 1/10 today in the front of the hip"    Patient Stated Goals  Wants to be able to get up stairs and walking outside    Currently in Pain?  Yes    Pain Score  1     Pain Location  Hip    Pain Orientation  Left    Pain Descriptors / Indicators  Aching    Pain Type  Chronic pain    Pain Onset  More than a month ago    Pain Frequency  Intermittent         [facility] PT Assessment - 03/21/20 0001      Assessment   Medical Diagnosis  Status post total hip replacement     Referring Provider (PT)  Brenda Ortiz                   [facility] Adult PT Treatment/Exercise - 03/21/20 0001      Knee/Hip Exercises: Stretches   Hip Flexor Stretch  2 reps;30 seconds      Knee/Hip Exercises: Aerobic   Nustep  L5 x 5 min LE only      Knee/Hip Exercises: Standing   Hip Flexion  2 sets;10 reps;Knee bent   LLE only using SPC and intermittent CGA for safety   Hip Abduction  2 sets;10 reps;Knee straight    Hip Extension  2 sets;10 reps      Knee/Hip Exercises: Seated   Sit to Sand  10 reps;without UE support;2 sets   2 inch step on table to help elevate table      Knee/Hip Exercises: Supine   Bridges  --    Bridges with Brenda Ortiz  2 sets;15 reps;Both;Strengthening    Other Supine Knee/Hip Exercises  hip flexion bil with heels on red physioball 2 x 20    Other Supine Knee/Hip  Exercises  bent knee raise 2 x 15 with red phyisoball between      Manual Therapy   Joint Mobilization  grade I LAD for pain LLE    Soft tissue mobilization   IASTM along the rectus femoris , and pectineus               PT Short Term Goals - 03/14/20 1545      PT SHORT TERM GOAL #1   Title  Patient will be able to independently initiate HEP    Baseline  Patient received HEP on 02/23/2020    Time  4    Period  Weeks    Status  On-going    Target Date  03/22/20      PT SHORT TERM GOAL #2   Title  Patient will be able to demonstrate and verbalize appropriate posture    Baseline  Forward head, decreased lumbar lordosis, Rounded shoulders    Time  4    Period  Weeks    Status  On-going    Target Date  03/22/20      PT SHORT TERM GOAL #3   Title  Patient will demonstrate an increased heel strike during initial contact in order to ambulate without an assistive device    Baseline  Using walker    Period  Weeks    Status  On-going    Target Date  03/22/20        PT Long Term Goals - 02/23/20 1301      PT LONG TERM GOAL #1   Title  Patient will  demsotrate 4+/5 gross bilateral hip strength in order to walk without pain    Baseline  4/5    Time  8    Period  Weeks    Status  New    Target Date  04/19/20      PT LONG TERM GOAL #2   Title  Patient will increase Lt. hip flexion and extension by >/= 10 degrees in order to navigate stairs in her home    Baseline  Lt. hip flexion: 64 Lt. hip extension: -2    Time  8    Period  Weeks    Status  New    Target Date  04/19/20      PT LONG TERM GOAL #3   Title  Patient will demonstrate 4/5 Lt. hip extension and abduction in order to get back to exercising independently per the patient's goals.    Baseline  extension: 3+/5,  abduction: 3+/5    Time  8    Period  Weeks    Status  New    Target Date  04/19/20      PT LONG TERM GOAL #4   Title  Patient will be independent with workout program to promote hip strength and stability.    Time  8    Period  Weeks    Target Date  04/19/20            Plan - 03/21/20 1430    Clinical Impression Statement  Brenda Ortiz reports 1/10 pain/ soreness today noting it feels like it is improving. continued working on stretching and STW along the proximal hip musculature which she responds well to. continued working on hip/ knee strengthening which she does well with but is hyper aware of her hip requeing intermittent reminders that soreness isn't causing damage. end of session she reported no pain.    PT Treatment/Interventions  ADLs/Self Care Home Management;Cryotherapy;Dealer  Stimulation;Iontophoresis 4mg /ml Dexamethasone;Moist Heat;Traction;Ultrasound;Gait training;Stair training;Functional mobility training;Therapeutic activities;Therapeutic exercise;Balance training;Neuromuscular re-education;Cognitive remediation;Patient/family education;Manual techniques;Compression bandaging;Scar mobilization;Passive range of motion;Dry needling;Energy conservation;Splinting;Visual/perceptual remediation/compensation;Joint Manipulations    PT Next Visit  Plan  progress standing exercise in WB and for AROM/strengthening, SLR, STW along the proximal hip PRN, update HEP PRN    PT Home Exercise Plan  Clams, SLR, Hip Flexor Stretch, Isometric Adduction w/ glute bridge    Consulted and Agree with Plan of Care  Patient       Patient will benefit from skilled therapeutic intervention in order to improve the following deficits and impairments:  Abnormal gait, Decreased balance, Decreased coordination, Decreased mobility, Decreased range of motion, Decreased scar mobility, Decreased strength, Difficulty walking, Increased fascial restricitons, Impaired flexibility, Improper body mechanics, Postural dysfunction, Pain  Visit Diagnosis: Stiffness of left hip, not elsewhere classified  Hip pain  Weakness of both hips     Problem List Patient Active Problem List   Diagnosis Date Noted  . Status post total replacement of left hip 02/03/2020  . Unilateral primary osteoarthritis, left hip 12/01/2019  . Low back pain 10/27/2019  . Left hip pain 10/27/2019  . Right hip pain 07/14/2017  . Chest pain 06/11/2015  . Hypothyroidism 06/11/2015   06/13/2015 PT, DPT, LAT, ATC  03/21/20  3:01 PM      [facility] [address] [place], [place], [place] Phone: [phone]   Fax:  [phone]  Name: Brenda Ortiz MRN: [name] Date of Birth: 1941-08-09

## 2020-03-26 ENCOUNTER — Ambulatory Visit: Payer: Medicare PPO | Admitting: Physical Therapy

## 2020-03-26 ENCOUNTER — Other Ambulatory Visit: Payer: Self-pay

## 2020-03-26 ENCOUNTER — Encounter: Payer: Self-pay | Admitting: Physical Therapy

## 2020-03-26 DIAGNOSIS — M25652 Stiffness of left hip, not elsewhere classified: Secondary | ICD-10-CM | POA: Diagnosis not present

## 2020-03-26 DIAGNOSIS — R29898 Other symptoms and signs involving the musculoskeletal system: Secondary | ICD-10-CM

## 2020-03-26 DIAGNOSIS — M25559 Pain in unspecified hip: Secondary | ICD-10-CM

## 2020-03-26 NOTE — Therapy (Signed)
[place], [place], [address] Phone: [phone]   Fax:  [phone]  Physical Therapy Treatment  Patient Details  Name: Brenda Ortiz MRN: [id number] Date of Birth: 12/03/1940 Referring Provider (PT): Aundra Dubin, [place]   Encounter Date: 03/26/2020  PT End of Session - 03/26/20 1110    Visit Number  10    Number of Visits  17    Date for PT Re-Evaluation  04/19/20    PT Start Time  7253    PT Stop Time  1143    PT Time Calculation (min)  38 min       Past Medical History:  Diagnosis Date  . Arthritis   . Hypothyroidism   . Incontinence   . Seasonal allergies     Past Surgical History:  Procedure Laterality Date  . DILATION AND CURETTAGE OF UTERUS     x2  . FOOT SURGERY Right   . HYSTEROSCOPY WITH D & C N/A 08/14/2016   Procedure: DILATATION AND CURETTAGE /HYSTEROSCOPY;  Surgeon: Brien Few, MD;  Location: [place] [facility];  Service: Gynecology;  Laterality: N/A;  . TONSILLECTOMY    . TOTAL HIP ARTHROPLASTY Left 02/03/2020   Procedure: LEFT TOTAL HIP ARTHROPLASTY ANTERIOR APPROACH;  Surgeon: Mcarthur Rossetti, MD;  Location: [facility];  Service: Orthopedics;  Laterality: Left;    There were no vitals filed for this visit.  Subjective Assessment - 03/26/20 1107    Subjective  I am so stiff. I am tired of having this pain in my hip muscles. I want to start back with my modified pilates and yoga. Walked 1/67mile this morning.    Currently in Pain?  Yes    Pain Score  1     Pain Location  Hip    Pain Orientation  Left    Pain Descriptors / Indicators  Aching    Pain Type  Chronic pain    Aggravating Factors   get up from sitting-pain wih stepping afterward    Pain Relieving Factors  Take time before stepping, sit slowly.                       [facility] Adult PT Treatment/Exercise - 03/26/20 0001      Neuro Re-ed    Neuro Re-ed Details   tandem, staggered and narrow  balance trials, SLS needs 2 finger support        Knee/Hip Exercises: Stretches   Hip Flexor Stretch  2 reps;30 seconds    Other Knee/Hip Stretches  Left hip adductor stretch -right side lunge        Knee/Hip Exercises: Aerobic   Nustep  L5 x 5 min LE only      Knee/Hip Exercises: Standing   Other Standing Knee Exercises  3 way hip at counter focusing lighttouch with SLS       Knee/Hip Exercises: Supine   Bridges  15 reps      Manual Therapy   Soft tissue mobilization  manual STW and roller to medial and anterior thigh                PT Short Term Goals - 03/14/20 1545      PT SHORT TERM GOAL #1   Title  Patient will be able to independently initiate HEP    Baseline  Patient received HEP on 02/23/2020    Time  4    Period  Weeks    Status  On-going    Target Date  03/22/20      PT SHORT TERM GOAL #2   Title  Patient will be able to demonstrate and verbalize appropriate posture    Baseline  Forward head, decreased lumbar lordosis, Rounded shoulders    Time  4    Period  Weeks    Status  On-going    Target Date  03/22/20      PT SHORT TERM GOAL #3   Title  Patient will demonstrate an increased heel strike during initial contact in order to ambulate without an assistive device    Baseline  Using walker    Period  Weeks    Status  On-going    Target Date  03/22/20        PT Long Term Goals - 02/23/20 1301      PT LONG TERM GOAL #1   Title  Patient will demsotrate 4+/5 gross bilateral hip strength in order to walk without pain    Baseline  4/5    Time  8    Period  Weeks    Status  New    Target Date  04/19/20      PT LONG TERM GOAL #2   Title  Patient will increase Lt. hip flexion and extension by >/= 10 degrees in order to navigate stairs in her home    Baseline  Lt. hip flexion: 64 Lt. hip extension: -2    Time  8    Period  Weeks    Status  New    Target Date  04/19/20      PT LONG TERM GOAL #3   Title  Patient will demonstrate 4/5 Lt. hip  extension and abduction in order to get back to exercising independently per the patient's goals.    Baseline  extension: 3+/5,  abduction: 3+/5    Time  8    Period  Weeks    Status  New    Target Date  04/19/20      PT LONG TERM GOAL #4   Title  Patient will be independent with workout program to promote hip strength and stability.    Time  8    Period  Weeks    Target Date  04/19/20            Plan - 03/26/20 1313    Clinical Impression Statement  Brenda Ortiz reports a low level of pain however she reports the pain limits her ability to transition from sit- stand without hesitation and prevents her from taking the first step without hesitiation. She is unable to SLS without UE support. More balance exercises today and time spent with stretching and STW to proxial/ medial hip muscles. She felt less pain with sit-tand afterward. SHe does UE support to get up from mat.    PT Next Visit Plan  progress standing exercise in WB and for AROM/strengthening, SLR, STW along the proximal hip PRN, update HEP PRN, sit-stand control    PT Home Exercise Plan  Clams, SLR, Hip Flexor Stretch, Isometric Adduction w/ glute bridge       Patient will benefit from skilled therapeutic intervention in order to improve the following deficits and impairments:  Abnormal gait, Decreased balance, Decreased coordination, Decreased mobility, Decreased range of motion, Decreased scar mobility, Decreased strength, Difficulty walking, Increased fascial restricitons, Impaired flexibility, Improper body mechanics, Postural dysfunction, Pain  Visit Diagnosis: Stiffness of left hip, not elsewhere classified  Hip pain  Weakness of both  hips     Problem List Patient Active Problem List   Diagnosis Date Noted  . Status post total replacement of left hip 02/03/2020  . Unilateral primary osteoarthritis, left hip 12/01/2019  . Low back pain 10/27/2019  . Left hip pain 10/27/2019  . Right hip pain 07/14/2017   . Chest pain 06/11/2015  . Hypothyroidism 06/11/2015    Sherrie Mustache, PTA 03/26/2020, 1:33 PM  [facility] [address] [place], [place], [address] Phone: [phone]   Fax:  [phone]  Name: Brenda Ortiz MRN: [id number] Date of Birth: September 16, 1941

## 2020-03-28 ENCOUNTER — Ambulatory Visit: Payer: Medicare PPO | Admitting: Physical Therapy

## 2020-03-28 ENCOUNTER — Encounter: Payer: Self-pay | Admitting: Physical Therapy

## 2020-03-28 ENCOUNTER — Other Ambulatory Visit: Payer: Self-pay

## 2020-03-28 DIAGNOSIS — M25559 Pain in unspecified hip: Secondary | ICD-10-CM

## 2020-03-28 DIAGNOSIS — M25652 Stiffness of left hip, not elsewhere classified: Secondary | ICD-10-CM | POA: Diagnosis not present

## 2020-03-28 DIAGNOSIS — R29898 Other symptoms and signs involving the musculoskeletal system: Secondary | ICD-10-CM

## 2020-03-28 NOTE — Therapy (Signed)
[place], [place], [address] Phone: [phone]   Fax:  [phone]  Physical Therapy Treatment  Patient Details  Name: Brenda Ortiz MRN: [id number] Date of Birth: 06-20-1941 Referring Provider (PT): Aundra Dubin, [place]   Encounter Date: 03/28/2020  PT End of Session - 03/28/20 0937    Visit Number  11    Number of Visits  17    Date for PT Re-Evaluation  04/19/20    PT Start Time  0932    PT Stop Time  1017    PT Time Calculation (min)  45 min       Past Medical History:  Diagnosis Date  . Arthritis   . Hypothyroidism   . Incontinence   . Seasonal allergies     Past Surgical History:  Procedure Laterality Date  . DILATION AND CURETTAGE OF UTERUS     x2  . FOOT SURGERY Right   . HYSTEROSCOPY WITH D & C N/A 08/14/2016   Procedure: DILATATION AND CURETTAGE /HYSTEROSCOPY;  Surgeon: Brien Few, MD;  Location: [facility];  Service: Gynecology;  Laterality: N/A;  . TONSILLECTOMY    . TOTAL HIP ARTHROPLASTY Left 02/03/2020   Procedure: LEFT TOTAL HIP ARTHROPLASTY ANTERIOR APPROACH;  Surgeon: Mcarthur Rossetti, MD;  Location: [facility];  Service: Orthopedics;  Laterality: Left;    There were no vitals filed for this visit.  Subjective Assessment - 03/28/20 0936    Subjective  I am stiff. I just got up. The pain at the hip has been better. The soft tissue work was very helpful last session.    Currently in Pain?  Yes    Pain Score  1     Pain Location  Hip    Pain Orientation  Left    Pain Descriptors / Indicators  Aching;Tightness    Pain Type  Chronic pain         [facility] PT Assessment - 03/28/20 0001      Observation/Other Assessments   Focus on Therapeutic Outcomes (FOTO)   59% improved from 53% limited                    [facility] Adult PT Treatment/Exercise - 03/28/20 0001      Neuro Re-ed    Neuro Re-ed Details   tandem, staggered and narrow balance trials, SLS  needs 2 finger support        Knee/Hip Exercises: Stretches   Hip Flexor Stretch  2 reps;30 seconds    Gastroc Stretch  2 reps;30 seconds    Other Knee/Hip Stretches  Left hip adductor stretch -right side lunge        Knee/Hip Exercises: Aerobic   Nustep  L5 x 5 min LE only      Knee/Hip Exercises: Standing   Forward Step Up  Right;Left;1 set;20 reps;Hand Hold: 1;Step Height: 6"    Other Standing Knee Exercises  3 way hip at counter focusing lighttouch with SLS    added airex pad     Knee/Hip Exercises: Seated   Sit to Sand  10 reps;without UE support;2 sets   foam pad for elevation on table     Manual Therapy   Soft tissue mobilization  manual STW and roller to medial and anterior thigh                PT Short Term Goals - 03/14/20 1545      PT SHORT TERM GOAL #1  Title  Patient will be able to independently initiate HEP    Baseline  Patient received HEP on 02/23/2020    Time  4    Period  Weeks    Status  On-going    Target Date  03/22/20      PT SHORT TERM GOAL #2   Title  Patient will be able to demonstrate and verbalize appropriate posture    Baseline  Forward head, decreased lumbar lordosis, Rounded shoulders    Time  4    Period  Weeks    Status  On-going    Target Date  03/22/20      PT SHORT TERM GOAL #3   Title  Patient will demonstrate an increased heel strike during initial contact in order to ambulate without an assistive device    Baseline  Using walker    Period  Weeks    Status  On-going    Target Date  03/22/20        PT Long Term Goals - 02/23/20 1301      PT LONG TERM GOAL #1   Title  Patient will demsotrate 4+/5 gross bilateral hip strength in order to walk without pain    Baseline  4/5    Time  8    Period  Weeks    Status  New    Target Date  04/19/20      PT LONG TERM GOAL #2   Title  Patient will increase Lt. hip flexion and extension by >/= 10 degrees in order to navigate stairs in her home    Baseline  Lt. hip flexion:  64 Lt. hip extension: -2    Time  8    Period  Weeks    Status  New    Target Date  04/19/20      PT LONG TERM GOAL #3   Title  Patient will demonstrate 4/5 Lt. hip extension and abduction in order to get back to exercising independently per the patient's goals.    Baseline  extension: 3+/5,  abduction: 3+/5    Time  8    Period  Weeks    Status  New    Target Date  04/19/20      PT LONG TERM GOAL #4   Title  Patient will be independent with workout program to promote hip strength and stability.    Time  8    Period  Weeks    Target Date  04/19/20            Plan - 03/28/20 1610    Clinical Impression Statement  Pt arrives carrying [facility] and stumbles as she walks into clinic, able to recover independently. She felt improvement in pain after STW last session. She is still hesitant to stand and step. Continued with balance and strength therex. Reminded of need for [facility] full time due to balance deficits.    PT Next Visit Plan  progress standing exercise in WB and for AROM/strengthening, SLR, STW along the proximal hip PRN, update HEP PRN, sit-stand control    PT Home Exercise Plan  Clams, SLR, Hip Flexor Stretch, Isometric Adduction w/ glute bridge       Patient will benefit from skilled therapeutic intervention in order to improve the following deficits and impairments:  Abnormal gait, Decreased balance, Decreased coordination, Decreased mobility, Decreased range of motion, Decreased scar mobility, Decreased strength, Difficulty walking, Increased fascial restricitons, Impaired flexibility, Improper body mechanics, Postural dysfunction, Pain  Visit Diagnosis: Stiffness  of left hip, not elsewhere classified  Hip pain  Weakness of both hips     Problem List Patient Active Problem List   Diagnosis Date Noted  . Status post total replacement of left hip 02/03/2020  . Unilateral primary osteoarthritis, left hip 12/01/2019  . Low back pain 10/27/2019  . Left hip pain  10/27/2019  . Right hip pain 07/14/2017  . Chest pain 06/11/2015  . Hypothyroidism 06/11/2015    Sherrie Mustache, PTA 03/28/2020, 10:22 AM  [facility] [address] [place], [place], [address] Phone: [phone]   Fax:  [phone]  Name: Brenda Ortiz MRN: [id number] Date of Birth: 24-Feb-1941

## 2020-04-02 ENCOUNTER — Ambulatory Visit: Payer: Medicare PPO | Admitting: Physical Therapy

## 2020-04-02 ENCOUNTER — Encounter: Payer: Self-pay | Admitting: Physical Therapy

## 2020-04-02 ENCOUNTER — Other Ambulatory Visit: Payer: Self-pay

## 2020-04-02 DIAGNOSIS — M25652 Stiffness of left hip, not elsewhere classified: Secondary | ICD-10-CM | POA: Diagnosis not present

## 2020-04-02 DIAGNOSIS — M25559 Pain in unspecified hip: Secondary | ICD-10-CM

## 2020-04-02 DIAGNOSIS — R29898 Other symptoms and signs involving the musculoskeletal system: Secondary | ICD-10-CM

## 2020-04-02 NOTE — Therapy (Signed)
[facility] [address] [place], [place], [address] Phone: [phone]   Fax:  [phone]  Physical Therapy Treatment Progress Note Reporting Period 02/23/2020  to 04/02/2020  See note below for Objective Data and Assessment of Progress/Goals.      Patient Details  Name: Brenda Ortiz MRN: [id number] Date of Birth: 14-Aug-1941 Referring Provider (PT): Cristie Hem, [place]   Encounter Date: 04/02/2020  PT End of Session - 04/02/20 1104    Visit Number  12    Number of Visits  17    Date for PT Re-Evaluation  04/19/20    Progress Note Due on Visit  20    PT Start Time  1101    PT Stop Time  1145    PT Time Calculation (min)  44 min    Activity Tolerance  Patient tolerated treatment well    Behavior During Therapy  [facility] for tasks assessed/performed       Past Medical History:  Diagnosis Date  . Arthritis   . Hypothyroidism   . Incontinence   . Seasonal allergies     Past Surgical History:  Procedure Laterality Date  . DILATION AND CURETTAGE OF UTERUS     x2  . FOOT SURGERY Right   . HYSTEROSCOPY WITH D & C N/A 08/14/2016   Procedure: DILATATION AND CURETTAGE /HYSTEROSCOPY;  Surgeon: Olivia Mackie, MD;  Location: [facility];  Service: Gynecology;  Laterality: N/A;  . TONSILLECTOMY    . TOTAL HIP ARTHROPLASTY Left 02/03/2020   Procedure: LEFT TOTAL HIP ARTHROPLASTY ANTERIOR APPROACH;  Surgeon: Kathryne Hitch, MD;  Location: [facility];  Service: Orthopedics;  Laterality: Left;    There were no vitals filed for this visit.  Subjective Assessment - 04/02/20 1106    Subjective  "I am still getting alot of stiffness in the hip especially after I have been sitting for long periods of time"    Patient Stated Goals  Wants to be able to get up stairs and walking outside    Currently in Pain?  Yes    Pain Location  Hip    Pain Orientation  Left    Pain Descriptors / Indicators  Aching;Tightness    Pain Type   Chronic pain    Aggravating Factors   getting up after sitting for long periods of time    Pain Relieving Factors  stretch         [facility] PT Assessment - 04/02/20 0001      Assessment   Medical Diagnosis  Status post total hip replacement    Referring Provider (PT)  Cristie Hem, PA-C      Strength   Left Hip Flexion  4+/5    Left Hip Extension  3+/5    Left Hip ABduction  3+/5                    [facility] Adult PT Treatment/Exercise - 04/02/20 0001      Therapeutic Activites    Other Therapeutic Activities  sit to stand stepping 10 ft and back x 5 cues to avoid overthinking activity       Knee/Hip Exercises: Stretches   Hip Flexor Stretch  30 seconds;4 reps   2 x in prone with strap 2 x in standing   Gastroc Stretch  2 reps;30 seconds   slan tboard     Knee/Hip Exercises: Aerobic   Nustep  L5 x 5 min LE only  Knee/Hip Exercises: Standing   Hip Flexion  Stengthening;2 sets;10 reps    Forward Step Up  2 sets;10 reps;Step Height: 6";Left      Knee/Hip Exercises: Supine   Bridges with Clamshell  2 sets;10 reps   with green band   Other Supine Knee/Hip Exercises  hp flexion with green theraband was around knees 2 x 10      Knee/Hip Exercises: Prone   Hip Extension  2 sets;10 reps   hamtring curl into hip extension          Balance Exercises - 04/02/20 1157      Balance Exercises: Standing   Standing Eyes Opened  Narrow base of support (BOS);2 reps;30 secs   2 x 15 pertubations with Narrow BOS   Standing Eyes Closed  Narrow base of support (BOS);2 reps;30 secs        PT Education - 04/02/20 1201    Education Details  reviewed avoiding to overthinking stiffness/ pain with standing from a seated positioning. taking breaks if sitting for longer than 30-45 min to reduce the hip from getting tight.    Person(s) Educated  Patient    Methods  Explanation;Handout    Comprehension  Verbalized understanding;Verbal cues required       PT Short Term  Goals - 04/02/20 1108      PT SHORT TERM GOAL #1   Title  Patient will be able to independently initiate HEP    Period  Weeks    Status  Achieved      PT SHORT TERM GOAL #2   Title  Patient will be able to demonstrate and verbalize appropriate posture    Period  Weeks    Status  Achieved      PT SHORT TERM GOAL #3   Title  Patient will demonstrate an increased heel strike during initial contact in order to ambulate without an assistive device    Period  Weeks    Status  Achieved        PT Long Term Goals - 04/02/20 1110      PT LONG TERM GOAL #1   Title  Patient will demsotrate 4+/5 gross bilateral hip strength in order to walk without pain    Period  Weeks            Plan - 04/02/20 1125    Clinical Impression Statement  pt reports she continues to feel increased stiffness in the front of the L hip. focused session on hip strengthening and balance training today which she does well. educated about avoiding over thinking activiites and focus on activity rather than pain/ stiffness. She reported no pain end of session.    PT Treatment/Interventions  ADLs/Self Care Home Management;Cryotherapy;Electrical Stimulation;Iontophoresis 4mg /ml Dexamethasone;Moist Heat;Traction;Ultrasound;Gait training;Stair training;Functional mobility training;Therapeutic activities;Therapeutic exercise;Balance training;Neuromuscular re-education;Cognitive remediation;Patient/family education;Manual techniques;Compression bandaging;Scar mobilization;Passive range of motion;Dry needling;Energy conservation;Splinting;Visual/perceptual remediation/compensation;Joint Manipulations    PT Next Visit Plan  progress standing exercise in WB and for AROM/strengthening, SLR, STW along the proximal hip PRN, update HEP PRN, sit-stand control    PT Home Exercise Plan  Clams, SLR, Hip Flexor Stretch, Isometric Adduction w/ glute bridge, bridge with clamshell, supine marching with resistance.    Consulted and Agree with  Plan of Care  Patient       Patient will benefit from skilled therapeutic intervention in order to improve the following deficits and impairments:  Abnormal gait, Decreased balance, Decreased coordination, Decreased mobility, Decreased range of motion, Decreased scar mobility, Decreased strength, Difficulty walking,  Increased fascial restricitons, Impaired flexibility, Improper body mechanics, Postural dysfunction, Pain  Visit Diagnosis: Stiffness of left hip, not elsewhere classified  Hip pain  Weakness of both hips     Problem List Patient Active Problem List   Diagnosis Date Noted  . Status post total replacement of left hip 02/03/2020  . Unilateral primary osteoarthritis, left hip 12/01/2019  . Low back pain 10/27/2019  . Left hip pain 10/27/2019  . Right hip pain 07/14/2017  . Chest pain 06/11/2015  . Hypothyroidism 06/11/2015   Lulu Riding PT, DPT, LAT, ATC  04/02/20  12:06 PM      [facility] Out[facility] Rehabilitation [facility] [address] [place], [place], [address] Phone: [phone]   Fax:  [phone]  Name: ARIBELLA VAVRA MRN: [id number] Date of Birth: Oct 30, 1941

## 2020-04-04 ENCOUNTER — Ambulatory Visit: Payer: Medicare PPO | Admitting: Physical Therapy

## 2020-04-04 ENCOUNTER — Other Ambulatory Visit: Payer: Self-pay

## 2020-04-04 DIAGNOSIS — R29898 Other symptoms and signs involving the musculoskeletal system: Secondary | ICD-10-CM

## 2020-04-04 DIAGNOSIS — M25652 Stiffness of left hip, not elsewhere classified: Secondary | ICD-10-CM

## 2020-04-04 DIAGNOSIS — M25559 Pain in unspecified hip: Secondary | ICD-10-CM

## 2020-04-04 NOTE — Therapy (Signed)
[facility] [address] [place], [place], [address] Phone: [phone]   Fax:  [phone]  Physical Therapy Treatment  Patient Details  Name: Brenda Ortiz MRN: [id number] Date of Birth: [birth year] Referring Provider (PT): Cristie Hem, [place]   Encounter Date: 04/04/2020  PT End of Session - 04/04/20 0936    Visit Number  13    Number of Visits  17    Date for PT Re-Evaluation  04/19/20    PT Start Time  0932    PT Stop Time  1014    PT Time Calculation (min)  42 min       Past Medical History:  Diagnosis Date  . Arthritis   . Hypothyroidism   . Incontinence   . Seasonal allergies     Past Surgical History:  Procedure Laterality Date  . DILATION AND CURETTAGE OF UTERUS     x2  . FOOT SURGERY Right   . HYSTEROSCOPY WITH D & C N/A 08/14/2016   Procedure: DILATATION AND CURETTAGE /HYSTEROSCOPY;  Surgeon: Olivia Mackie, MD;  Location: [facility];  Service: Gynecology;  Laterality: N/A;  . TONSILLECTOMY    . TOTAL HIP ARTHROPLASTY Left 02/03/2020   Procedure: LEFT TOTAL HIP ARTHROPLASTY ANTERIOR APPROACH;  Surgeon: Kathryne Hitch, MD;  Location: [facility];  Service: Orthopedics;  Laterality: Left;    There were no vitals filed for this visit.  Subjective Assessment - 04/04/20 0937    Subjective  No pain now but I had some pain during the night in the hip.    Currently in Pain?  Yes    Pain Score  1     Pain Location  Hip    Pain Orientation  Left    Pain Descriptors / Indicators  Aching;Tightness    Pain Type  Chronic pain                        [facility] Adult PT Treatment/Exercise - 04/04/20 0001      Knee/Hip Exercises: Aerobic   Nustep  L5 x 5 min LE only      Knee/Hip Exercises: Standing   Gait Training   cues for heel strike     Other Standing Knee Exercises  resisted gait with free motion 3# 4 x forward, retro and x 2 each for lateral stepping.       Knee/Hip Exercises:  Seated   Sit to Sand  10 reps   hands to riase, No UE to lower- uncontrolled descent- cues     Knee/Hip Exercises: Supine   Bridges with Clamshell  2 sets;10 reps   with green band, hands across chest    Single Leg Bridge  2 sets;10 reps    Other Supine Knee/Hip Exercises  hp flexion with green theraband was around knees 2 x 10               PT Short Term Goals - 04/02/20 1108      PT SHORT TERM GOAL #1   Title  Patient will be able to independently initiate HEP    Period  Weeks    Status  Achieved      PT SHORT TERM GOAL #2   Title  Patient will be able to demonstrate and verbalize appropriate posture    Period  Weeks    Status  Achieved      PT SHORT TERM GOAL #3   Title  Patient will demonstrate an  increased heel strike during initial contact in order to ambulate without an assistive device    Period  Weeks    Status  Achieved        PT Long Term Goals - 04/02/20 1110      PT LONG TERM GOAL #1   Title  Patient will demsotrate 4+/5 gross bilateral hip strength in order to walk without pain    Period  Weeks            Plan - 04/04/20 1018    Clinical Impression Statement  Pt reports she is about the same. She is trying to be minful of heel strike with gait and is still hesitant with first stepping after rising. Used resisted gait for strength and stabilization. Reviewed newest hep. She needs cues to maintain LE alignement. Has uncontrolled descent for sit-stands and needs UE to rise.    PT Next Visit Plan  progress standing exercise in WB and for AROM/strengthening, SLR, STW along the proximal hip PRN, update HEP PRN, sit-stand control    PT Home Exercise Plan  Clams, SLR, Hip Flexor Stretch, Isometric Adduction w/ glute bridge, bridge with clamshell, supine marching with resistance.       Patient will benefit from skilled therapeutic intervention in order to improve the following deficits and impairments:  Abnormal gait, Decreased balance, Decreased  coordination, Decreased mobility, Decreased range of motion, Decreased scar mobility, Decreased strength, Difficulty walking, Increased fascial restricitons, Impaired flexibility, Improper body mechanics, Postural dysfunction, Pain  Visit Diagnosis: Stiffness of left hip, not elsewhere classified  Hip pain  Weakness of both hips     Problem List Patient Active Problem List   Diagnosis Date Noted  . Status post total replacement of left hip 02/03/2020  . Unilateral primary osteoarthritis, left hip 12/01/2019  . Low back pain 10/27/2019  . Left hip pain 10/27/2019  . Right hip pain 07/14/2017  . Chest pain 06/11/2015  . Hypothyroidism 06/11/2015    Dorene Ar, PTA 04/04/2020, 10:20 AM  [facility] [address] [place], [place], [address] Phone: [phone]   Fax:  [phone]  Name: Brenda Ortiz MRN: [id number] Date of Birth: [birth year]

## 2020-04-10 ENCOUNTER — Ambulatory Visit: Payer: Medicare PPO | Admitting: Physical Therapy

## 2020-04-10 ENCOUNTER — Other Ambulatory Visit: Payer: Self-pay

## 2020-04-10 ENCOUNTER — Encounter: Payer: Self-pay | Admitting: Physical Therapy

## 2020-04-10 DIAGNOSIS — R29898 Other symptoms and signs involving the musculoskeletal system: Secondary | ICD-10-CM

## 2020-04-10 DIAGNOSIS — M25559 Pain in unspecified hip: Secondary | ICD-10-CM

## 2020-04-10 DIAGNOSIS — M25652 Stiffness of left hip, not elsewhere classified: Secondary | ICD-10-CM | POA: Diagnosis not present

## 2020-04-10 NOTE — Therapy (Signed)
[facility] [address] [place], [place], [address] Phone: [phone]   Fax:  [phone]  Physical Therapy Treatment  Patient Details  Name: Brenda Ortiz MRN: [id number] Date of Birth: May 21, 1941 Referring Provider (PT): Cristie Hem, [place]   Encounter Date: 04/10/2020  PT End of Session - 04/10/20 1105    Visit Number  14    Number of Visits  17    Date for PT Re-Evaluation  04/19/20    PT Start Time  1102    PT Stop Time  1144    PT Time Calculation (min)  42 min       Past Medical History:  Diagnosis Date  . Arthritis   . Hypothyroidism   . Incontinence   . Seasonal allergies     Past Surgical History:  Procedure Laterality Date  . DILATION AND CURETTAGE OF UTERUS     x2  . FOOT SURGERY Right   . HYSTEROSCOPY WITH D & C N/A 08/14/2016   Procedure: DILATATION AND CURETTAGE /HYSTEROSCOPY;  Surgeon: Olivia Mackie, MD;  Location: [facility];  Service: Gynecology;  Laterality: N/A;  . TONSILLECTOMY    . TOTAL HIP ARTHROPLASTY Left 02/03/2020   Procedure: LEFT TOTAL HIP ARTHROPLASTY ANTERIOR APPROACH;  Surgeon: Kathryne Hitch, MD;  Location: [facility];  Service: Orthopedics;  Laterality: Left;    There were no vitals filed for this visit.  Subjective Assessment - 04/10/20 1105    Subjective  The pain interferes with my getting up and down and going up the stairs.    Currently in Pain?  No/denies                        [facility] Adult PT Treatment/Exercise - 04/10/20 0001      Knee/Hip Exercises: Stretches   Hip Flexor Stretch  2 reps;30 seconds    Other Knee/Hip Stretches  knee to chest left 2 x 20 sec       Knee/Hip Exercises: Standing   Other Standing Knee Exercises  wall slides x 10, 5 x with 5 sec hold, cues for equal weight bearing      Knee/Hip Exercises: Seated   Sit to Sand  10 reps   airex pead and pole for assist      Knee/Hip Exercises: Supine   Bridges  15 reps    Single Leg Bridge  2 sets;10 reps      Cryotherapy   Number Minutes Cryotherapy  10 Minutes    Cryotherapy Location  Hip    Type of Cryotherapy  Ice pack      Manual Therapy   Soft tissue mobilization  manual STW and roller to medial and anterior thigh                PT Short Term Goals - 04/02/20 1108      PT SHORT TERM GOAL #1   Title  Patient will be able to independently initiate HEP    Period  Weeks    Status  Achieved      PT SHORT TERM GOAL #2   Title  Patient will be able to demonstrate and verbalize appropriate posture    Period  Weeks    Status  Achieved      PT SHORT TERM GOAL #3   Title  Patient will demonstrate an increased heel strike during initial contact in order to ambulate without an assistive device    Period  Weeks  Status  Achieved        PT Long Term Goals - 04/02/20 1110      PT LONG TERM GOAL #1   Title  Patient will demsotrate 4+/5 gross bilateral hip strength in order to walk without pain    Period  Weeks            Plan - 04/10/20 1224    Clinical Impression Statement  Pt arrives with frustration about ongoing pain. She is walking and cycling as well as stretching. Reviewed strengthening and encouraged her to perform more dedicated hip strengthening focusing gluteals. Added wall sits today and she did well with these.    PT Next Visit Plan  continue wall slides. progress standing exercise in WB and for AROM/strengthening, SLR, STW along the proximal hip PRN, update HEP PRN, sit-stand control    PT Home Exercise Plan  Clams, SLR, Hip Flexor Stretch, Isometric Adduction w/ glute bridge, bridge with clamshell, supine marching with resistance.       Patient will benefit from skilled therapeutic intervention in order to improve the following deficits and impairments:  Abnormal gait, Decreased balance, Decreased coordination, Decreased mobility, Decreased range of motion, Decreased scar mobility, Decreased strength, Difficulty  walking, Increased fascial restricitons, Impaired flexibility, Improper body mechanics, Postural dysfunction, Pain  Visit Diagnosis: Stiffness of left hip, not elsewhere classified  Hip pain  Weakness of both hips     Problem List Patient Active Problem List   Diagnosis Date Noted  . Status post total replacement of left hip 02/03/2020  . Unilateral primary osteoarthritis, left hip 12/01/2019  . Low back pain 10/27/2019  . Left hip pain 10/27/2019  . Right hip pain 07/14/2017  . Chest pain 06/11/2015  . Hypothyroidism 06/11/2015    Dorene Ar, PTA 04/10/2020, 12:27 PM  [facility] [address] [place], [place], [address] Phone: [phone]   Fax:  [phone]  Name: ANNASOPHIA CROCKER MRN: [id number] Date of Birth: [birth year]

## 2020-04-12 ENCOUNTER — Encounter: Payer: Self-pay | Admitting: Physical Therapy

## 2020-04-12 ENCOUNTER — Encounter: Payer: Self-pay | Admitting: Orthopaedic Surgery

## 2020-04-12 ENCOUNTER — Ambulatory Visit: Payer: Medicare PPO | Admitting: Orthopaedic Surgery

## 2020-04-12 ENCOUNTER — Ambulatory Visit: Payer: Medicare PPO | Admitting: Physical Therapy

## 2020-04-12 ENCOUNTER — Other Ambulatory Visit: Payer: Self-pay

## 2020-04-12 DIAGNOSIS — R29898 Other symptoms and signs involving the musculoskeletal system: Secondary | ICD-10-CM

## 2020-04-12 DIAGNOSIS — M25559 Pain in unspecified hip: Secondary | ICD-10-CM

## 2020-04-12 DIAGNOSIS — M25652 Stiffness of left hip, not elsewhere classified: Secondary | ICD-10-CM | POA: Diagnosis not present

## 2020-04-12 DIAGNOSIS — Z96642 Presence of left artificial hip joint: Secondary | ICD-10-CM

## 2020-04-12 NOTE — Progress Notes (Signed)
The patient is now 10 weeks tomorrow status post a left total hip arthroplasty.  She is active 79 year old female.  She is still in physical therapy because she is not making the progress that she would like to make.  She is certainly better but she says it is still been slow growing and she wishes that she is further along.  She is patient know about things.  On exam she lets me put her left hip the range of motion pretty easily.  She has good quad strength and good hip flexor strength.  We had a long thorough discussion about her hip.  I do feel that she is getting better as time goes by.  I would like to see her back in 3 months.  At that visit I like a standing low AP pelvis and lateral of her left operative hip.  All questions and concerns were answered and addressed.

## 2020-04-12 NOTE — Therapy (Signed)
[place], [place], [address] Phone: [phone]   Fax:  [phone]  Physical Therapy Treatment / Re-certification   Patient Details  Name: Brenda Ortiz MRN: [id number] Date of Birth: June 01, 1941 Referring Provider (PT): Aundra Dubin, [place]   Encounter Date: 04/12/2020  PT End of Session - 04/12/20 0854    Visit Number  15    Number of Visits  23    Date for PT Re-Evaluation  05/17/20    Progress Note Due on Visit  20    PT Start Time  [phone]   pt arrived 8 min late today   PT Stop Time  0931    PT Time Calculation (min)  38 min    Activity Tolerance  Patient tolerated treatment well    Behavior During Therapy  [facility] for tasks assessed/performed       Past Medical History:  Diagnosis Date  . Arthritis   . Hypothyroidism   . Incontinence   . Seasonal allergies     Past Surgical History:  Procedure Laterality Date  . DILATION AND CURETTAGE OF UTERUS     x2  . FOOT SURGERY Right   . HYSTEROSCOPY WITH D & C N/A 08/14/2016   Procedure: DILATATION AND CURETTAGE /HYSTEROSCOPY;  Surgeon: Brien Few, MD;  Location: [facility];  Service: Gynecology;  Laterality: N/A;  . TONSILLECTOMY    . TOTAL HIP ARTHROPLASTY Left 02/03/2020   Procedure: LEFT TOTAL HIP ARTHROPLASTY ANTERIOR APPROACH;  Surgeon: Mcarthur Rossetti, MD;  Location: [facility];  Service: Orthopedics;  Laterality: Left;    There were no vitals filed for this visit.  Subjective Assessment - 04/12/20 0855    Subjective  " I still get some tightness in the front of the hip and soreness."    Patient Stated Goals  Wants to be able to get up stairs and walking outside    Currently in Pain?  Yes    Pain Score  2     Pain Orientation  Left    Pain Type  Chronic pain    Pain Onset  More than a month ago    Pain Frequency  Intermittent    Aggravating Factors   getting up after sitting for a long period of time    Pain Relieving Factors   stretch         [facility] PT Assessment - 04/12/20 0001      Assessment   Medical Diagnosis  Status post total hip replacement    Referring Provider (PT)  Aundra Dubin, PA-C    Onset Date/Surgical Date  04/12/20      AROM   Left Hip Extension  5    Left Hip Flexion  96      Strength   Left Hip Flexion  4/5    Left Hip Extension  3+/5   in available ROM   Left Hip ABduction  3+/5                    [facility] Adult PT Treatment/Exercise - 04/12/20 0001      Knee/Hip Exercises: Stretches   Hip Flexor Stretch  2 reps;30 seconds      Knee/Hip Exercises: Aerobic   Nustep  --      Knee/Hip Exercises: Standing   Forward Step Up  2 sets;15 reps;Step Height: 6"    Wall Squat  2 sets;10 reps      Knee/Hip Exercises: Seated  Sit to Sand  1 set;10 reps      Knee/Hip Exercises: Supine   Bridges  Strengthening;Both;3 sets;15 reps    Bridges Limitations  With RLE advanced forward to increase L glute activation      Knee/Hip Exercises: Sidelying   Hip ABduction  2 sets;10 reps   focused controlled eccentrics     Manual Therapy   Soft tissue mobilization  manual STW and roller to medial and anterior thigh              PT Education - 04/12/20 0929    Education Details  reviewed HEP and updated today for wall squats. time taken to discuss POC and importance of all exercises.    Person(s) Educated  Patient    Methods  Explanation;Verbal cues;Handout    Comprehension  Verbalized understanding;Verbal cues required       PT Short Term Goals - 04/02/20 1108      PT SHORT TERM GOAL #1   Title  Patient will be able to independently initiate HEP    Period  Weeks    Status  Achieved      PT SHORT TERM GOAL #2   Title  Patient will be able to demonstrate and verbalize appropriate posture    Period  Weeks    Status  Achieved      PT SHORT TERM GOAL #3   Title  Patient will demonstrate an increased heel strike during initial contact in order to ambulate  without an assistive device    Period  Weeks    Status  Achieved        PT Long Term Goals - 04/12/20 0857      PT LONG TERM GOAL #1   Title  Patient will demsotrate 4+/5 gross bilateral hip strength in order to walk without pain    Period  Weeks    Status  On-going      PT LONG TERM GOAL #2   Title  Patient will increase Lt. hip flexion and extension by >/= 10 degrees in order to navigate stairs in her home    Baseline  L hip extension ipmroved by 7 degrees, increased hip flexion 97 today    Period  Weeks    Status  Partially Met      PT LONG TERM GOAL #3   Title  Patient will demonstrate 4/5 Lt. hip extension and abduction in order to get back to exercising independently per the patient's goals.    Period  Weeks    Status  On-going      PT LONG TERM GOAL #4   Title  Patient will be independent with workout program to promote hip strength and stability.    Period  Weeks    Status  On-going            Plan - 04/12/20 0909    Clinical Impression Statement  Brenda Ortiz is making progress with phsyical therapy increasing hip ROM and reporting pain at a 1-2/10 described as intermittent. She continues to favor the the LLE and associated weakness. She is consistent with most of her exericses but is less consistent with the more challenging exercises. Continued working gross hip strengthenig which she is able to perform but conitnues to fatigue quickly. She would benefit from continued physical therapy to decrease hp pain/ stiffness, increase gross hip strength and maximize her function by addressing the deficits listed.    PT Frequency  2x / week    PT Duration  4 weeks    PT Treatment/Interventions  ADLs/Self Care Home Management;Cryotherapy;Electrical Stimulation;Iontophoresis 64m/ml Dexamethasone;Moist Heat;Traction;Ultrasound;Gait training;Stair training;Functional mobility training;Therapeutic activities;Therapeutic exercise;Balance training;Neuromuscular  re-education;Cognitive remediation;Patient/family education;Manual techniques;Compression bandaging;Scar mobilization;Passive range of motion;Dry needling;Energy conservation;Splinting;Visual/perceptual remediation/compensation;Joint Manipulations    PT Next Visit Plan  continue wall slides. progress standing exercise in WB and for AROM/strengthening, SLR, STW along the proximal hip PRN, update HEP PRN, sit-stand control    PT Home Exercise Plan  Clams, SLR, Hip Flexor Stretch, Isometric Adduction w/ glute bridge, bridge with clamshell, supine marching with resistance. wall squat    Consulted and Agree with Plan of Care  Patient       Patient will benefit from skilled therapeutic intervention in order to improve the following deficits and impairments:  Abnormal gait, Decreased balance, Decreased coordination, Decreased mobility, Decreased range of motion, Decreased scar mobility, Decreased strength, Difficulty walking, Increased fascial restricitons, Impaired flexibility, Improper body mechanics, Postural dysfunction, Pain  Visit Diagnosis: Stiffness of left hip, not elsewhere classified  Hip pain  Weakness of both hips     Problem List Patient Active Problem List   Diagnosis Date Noted  . Status post total replacement of left hip 02/03/2020  . Unilateral primary osteoarthritis, left hip 12/01/2019  . Low back pain 10/27/2019  . Left hip pain 10/27/2019  . Right hip pain 07/14/2017  . Chest pain 06/11/2015  . Hypothyroidism 06/11/2015   KStarr LakePT, DPT, LAT, ATC  04/12/20  9:31 AM      [facility] [address]Phone: [phone]  Fax:  [phone] Name: Brenda DILUZIOMRN: [id number] of Birth: 1Sep 14, 1942

## 2020-04-23 ENCOUNTER — Ambulatory Visit: Payer: Medicare PPO | Attending: Physician Assistant | Admitting: Physical Therapy

## 2020-04-23 ENCOUNTER — Other Ambulatory Visit: Payer: Self-pay

## 2020-04-23 DIAGNOSIS — M25559 Pain in unspecified hip: Secondary | ICD-10-CM | POA: Insufficient documentation

## 2020-04-23 DIAGNOSIS — R29898 Other symptoms and signs involving the musculoskeletal system: Secondary | ICD-10-CM | POA: Diagnosis present

## 2020-04-23 DIAGNOSIS — M25652 Stiffness of left hip, not elsewhere classified: Secondary | ICD-10-CM | POA: Diagnosis not present

## 2020-04-23 NOTE — Therapy (Signed)
[place], [place], [address] Phone: [phone]   Fax:  [phone]  Physical Therapy Treatment  Patient Details  Name: Brenda Ortiz MRN: [id number] Date of Birth: [birth year] Referring Provider (PT): Aundra Dubin, [place]   Encounter Date: 04/23/2020  PT End of Session - 04/23/20 1204    Visit Number  16    Number of Visits  23    Date for PT Re-Evaluation  05/17/20    PT Start Time  1150    PT Stop Time  1240    PT Time Calculation (min)  50 min       Past Medical History:  Diagnosis Date   Arthritis    Hypothyroidism    Incontinence    Seasonal allergies     Past Surgical History:  Procedure Laterality Date   DILATION AND CURETTAGE OF UTERUS     x2   FOOT SURGERY Right    HYSTEROSCOPY WITH D & C N/A 08/14/2016   Procedure: DILATATION AND CURETTAGE /HYSTEROSCOPY;  Surgeon: Brien Few, MD;  Location: [facility];  Service: Gynecology;  Laterality: N/A;   TONSILLECTOMY     TOTAL HIP ARTHROPLASTY Left 02/03/2020   Procedure: LEFT TOTAL HIP ARTHROPLASTY ANTERIOR APPROACH;  Surgeon: Mcarthur Rossetti, MD;  Location: [facility];  Service: Orthopedics;  Laterality: Left;    There were no vitals filed for this visit.  Subjective Assessment - 04/23/20 1200    Subjective  1/10 I am improving. MD said to come back in 3 months. He said it would take time to heal    Currently in Pain?  Yes    Pain Location  Hip    Pain Orientation  Left    Pain Descriptors / Indicators  Aching;Tightness    Pain Type  Chronic pain                        [facility] Adult PT Treatment/Exercise - 04/23/20 0001      Knee/Hip Exercises: Stretches   Hip Flexor Stretch  2 reps;30 seconds      Knee/Hip Exercises: Aerobic   Nustep  L5 x 7 min LE only      Knee/Hip Exercises: Standing   Hip Extension  15 reps    Wall Squat  2 sets;10 reps      Knee/Hip Exercises: Seated   Sit to Sand  1  set;10 reps      Knee/Hip Exercises: Supine   Bridges  Strengthening;Both;3 sets;15 reps    Straight Leg Raises  10 reps    Straight Leg Raise with External Rotation  5 reps      Knee/Hip Exercises: Sidelying   Hip ABduction  2 sets;10 reps   focused controlled eccentrics     Cryotherapy   Number Minutes Cryotherapy  10 Minutes    Cryotherapy Location  Hip    Type of Cryotherapy  Ice pack      Manual Therapy   Soft tissue mobilization  manual STW to left thigh                PT Short Term Goals - 04/02/20 1108      PT SHORT TERM GOAL #1   Title  Patient will be able to independently initiate HEP    Period  Weeks    Status  Achieved      PT SHORT TERM GOAL #2   Title  Patient will be able to  demonstrate and verbalize appropriate posture    Period  Weeks    Status  Achieved      PT SHORT TERM GOAL #3   Title  Patient will demonstrate an increased heel strike during initial contact in order to ambulate without an assistive device    Period  Weeks    Status  Achieved        PT Long Term Goals - 04/12/20 0857      PT LONG TERM GOAL #1   Title  Patient will demsotrate 4+/5 gross bilateral hip strength in order to walk without pain    Period  Weeks    Status  On-going      PT LONG TERM GOAL #2   Title  Patient will increase Lt. hip flexion and extension by >/= 10 degrees in order to navigate stairs in her home    Baseline  L hip extension ipmroved by 7 degrees, increased hip flexion 97 today    Period  Weeks    Status  Partially Met      PT LONG TERM GOAL #3   Title  Patient will demonstrate 4/5 Lt. hip extension and abduction in order to get back to exercising independently per the patient's goals.    Period  Weeks    Status  On-going      PT LONG TERM GOAL #4   Title  Patient will be independent with workout program to promote hip strength and stability.    Period  Weeks    Status  On-going            Plan - 04/23/20 1317    Clinical  Impression Statement  Pt reports she feels a little improvement in pain. She notes inreased achiness after a long walk yesterday. Continued focus on gluteal strength and sit-stands. She needs elevated seat to rise without hands. She is not consistent working on this at home. Continued encouragement provided to be consistent with HEP.    PT Next Visit Plan  continue wall slides. progress standing exercise in WB and for AROM/strengthening, SLR, STW along the proximal hip PRN, update HEP PRN, sit-stand control    PT Home Exercise Plan  Clams, SLR, Hip Flexor Stretch, Isometric Adduction w/ glute bridge, bridge with clamshell, supine marching with resistance. wall squat       Patient will benefit from skilled therapeutic intervention in order to improve the following deficits and impairments:  Abnormal gait, Decreased balance, Decreased coordination, Decreased mobility, Decreased range of motion, Decreased scar mobility, Decreased strength, Difficulty walking, Increased fascial restricitons, Impaired flexibility, Improper body mechanics, Postural dysfunction, Pain  Visit Diagnosis: Stiffness of left hip, not elsewhere classified  Hip pain  Weakness of both hips     Problem List Patient Active Problem List   Diagnosis Date Noted   Status post total replacement of left hip 02/03/2020   Unilateral primary osteoarthritis, left hip 12/01/2019   Low back pain 10/27/2019   Left hip pain 10/27/2019   Right hip pain 07/14/2017   Chest pain 06/11/2015   Hypothyroidism 06/11/2015    Dorene Ar, PTA 04/23/2020, 1:27 PM  [facility] [address] [place], [place], [address] Phone: [phone]   Fax:  [phone]  Name: Brenda Ortiz MRN: [id number] Date of Birth: 05-31-1941

## 2020-04-26 ENCOUNTER — Ambulatory Visit: Payer: Medicare PPO | Admitting: Physical Therapy

## 2020-04-26 ENCOUNTER — Encounter: Payer: Self-pay | Admitting: Physical Therapy

## 2020-04-26 ENCOUNTER — Other Ambulatory Visit: Payer: Self-pay

## 2020-04-26 DIAGNOSIS — M25559 Pain in unspecified hip: Secondary | ICD-10-CM

## 2020-04-26 DIAGNOSIS — M25652 Stiffness of left hip, not elsewhere classified: Secondary | ICD-10-CM

## 2020-04-26 DIAGNOSIS — R29898 Other symptoms and signs involving the musculoskeletal system: Secondary | ICD-10-CM

## 2020-04-26 NOTE — Therapy (Signed)
[place], [place], [address] Phone: [phone]   Fax:  [phone]  Physical Therapy Treatment  Patient Details  Name: Brenda Ortiz MRN: [id number] Date of Birth: 1941/08/05 Referring Provider (PT): Aundra Dubin, [place]   Encounter Date: 04/26/2020   PT End of Session - 04/26/20 0939    Visit Number 17    Number of Visits 23    Date for PT Re-Evaluation 05/17/20    PT Start Time 0932    PT Stop Time 1025    PT Time Calculation (min) 53 min           Past Medical History:  Diagnosis Date  . Arthritis   . Hypothyroidism   . Incontinence   . Seasonal allergies     Past Surgical History:  Procedure Laterality Date  . DILATION AND CURETTAGE OF UTERUS     x2  . FOOT SURGERY Right   . HYSTEROSCOPY WITH D & C N/A 08/14/2016   Procedure: DILATATION AND CURETTAGE /HYSTEROSCOPY;  Surgeon: Brien Few, MD;  Location: [facility];  Service: Gynecology;  Laterality: N/A;  . TONSILLECTOMY    . TOTAL HIP ARTHROPLASTY Left 02/03/2020   Procedure: LEFT TOTAL HIP ARTHROPLASTY ANTERIOR APPROACH;  Surgeon: Mcarthur Rossetti, MD;  Location: [facility];  Service: Orthopedics;  Laterality: Left;    There were no vitals filed for this visit.   Subjective Assessment - 04/26/20 0938    Subjective Feel mostly stiff. Some pain this morning and when I stepped out of the car. Better once I get started.    Currently in Pain? Yes    Pain Score 2     Pain Location Hip    Pain Orientation Left    Pain Descriptors / Indicators Aching;Tightness    Pain Type Chronic pain    Aggravating Factors  getting up and walking after sitting    Pain Relieving Factors stretch                             [facility] Adult PT Treatment/Exercise - 04/26/20 0001      Knee/Hip Exercises: Aerobic   Recumbent Bike L1 x 6 minutes       Knee/Hip Exercises: Standing   Heel Raises 20 reps    Heel Raises Limitations  2 #     Hip Flexion 15 reps    Hip Flexion Limitations 2#    Hip Abduction 15 reps    Abduction Limitations 2#     Hip Extension 15 reps    Extension Limitations 2#    Wall Squat 2 sets;10 reps    Other Standing Knee Exercises hip hinge training in standing and seated to improve sit-stand technique- max cues    Other Standing Knee Exercises side stepping with red band at counter      Knee/Hip Exercises: Seated   Other Seated Knee/Hip Exercises seated physioball rollouts , attempting hip hinge- max cues     Sit to Sand 1 set;10 reps   LLE back      Cryotherapy   Number Minutes Cryotherapy 10 Minutes    Cryotherapy Location Hip    Type of Cryotherapy Ice pack                    PT Short Term Goals - 04/02/20 1108      PT SHORT TERM GOAL #1   Title Patient will be able  to independently initiate HEP    Period Weeks    Status Achieved      PT SHORT TERM GOAL #2   Title Patient will be able to demonstrate and verbalize appropriate posture    Period Weeks    Status Achieved      PT SHORT TERM GOAL #3   Title Patient will demonstrate an increased heel strike during initial contact in order to ambulate without an assistive device    Period Weeks    Status Achieved             PT Long Term Goals - 04/12/20 0857      PT LONG TERM GOAL #1   Title Patient will demsotrate 4+/5 gross bilateral hip strength in order to walk without pain    Period Weeks    Status On-going      PT LONG TERM GOAL #2   Title Patient will increase Lt. hip flexion and extension by >/= 10 degrees in order to navigate stairs in her home    Baseline L hip extension ipmroved by 7 degrees, increased hip flexion 97 today    Period Weeks    Status Partially Met      PT LONG TERM GOAL #3   Title Patient will demonstrate 4/5 Lt. hip extension and abduction in order to get back to exercising independently per the patient's goals.    Period Weeks    Status On-going      PT LONG TERM GOAL #4    Title Patient will be independent with workout program to promote hip strength and stability.    Period Weeks    Status On-going                 Plan - 04/26/20 1029    Clinical Impression Statement Pt arrives without [facility] and reports she is trying to go without AD. Rec bike for warm up today was toelrated well. Used theraband for resistance standing hip exercises. Still difficult to rise from seated. Worked on hip hinge mechanics which she has much difficulty. Used phyioball to work on flexion at hips and to avoid rounding trunk. She required max cues for this. She has difficulty shifting weight fully forward to rise into standing. Increased time spent working on this today. She required elevated surface  and UE to rise today,    PT Next Visit Plan continue hip hinge mechanics for sit-stands, continue wall slides. progress standing exercise in WB and for AROM/strengthening, SLR, STW along the proximal hip PRN, update HEP PRN, sit-stand control    PT Home Exercise Plan Clams, SLR, Hip Flexor Stretch, Isometric Adduction w/ glute bridge, bridge with clamshell, supine marching with resistance. wall squat           Patient will benefit from skilled therapeutic intervention in order to improve the following deficits and impairments:  Abnormal gait, Decreased balance, Decreased coordination, Decreased mobility, Decreased range of motion, Decreased scar mobility, Decreased strength, Difficulty walking, Increased fascial restricitons, Impaired flexibility, Improper body mechanics, Postural dysfunction, Pain  Visit Diagnosis: Stiffness of left hip, not elsewhere classified  Hip pain  Weakness of both hips     Problem List Patient Active Problem List   Diagnosis Date Noted  . Status post total replacement of left hip 02/03/2020  . Unilateral primary osteoarthritis, left hip 12/01/2019  . Low back pain 10/27/2019  . Left hip pain 10/27/2019  . Right hip pain 07/14/2017  . Chest pain  06/11/2015  . Hypothyroidism  06/11/2015    Dorene Ar, PTA 04/26/2020, 10:43 AM  [place], [place], [address] Phone: [phone]   Fax:  [phone]  Name: Brenda Ortiz MRN: [id number] Date of Birth: 21-Aug-1941

## 2020-04-30 ENCOUNTER — Ambulatory Visit: Payer: Medicare PPO | Admitting: Physical Therapy

## 2020-04-30 ENCOUNTER — Other Ambulatory Visit: Payer: Self-pay

## 2020-04-30 DIAGNOSIS — R29898 Other symptoms and signs involving the musculoskeletal system: Secondary | ICD-10-CM

## 2020-04-30 DIAGNOSIS — M25652 Stiffness of left hip, not elsewhere classified: Secondary | ICD-10-CM | POA: Diagnosis not present

## 2020-04-30 DIAGNOSIS — M25559 Pain in unspecified hip: Secondary | ICD-10-CM

## 2020-04-30 NOTE — Therapy (Signed)
[place], [place], [address] Phone: [phone]   Fax:  [phone]  Physical Therapy Treatment  Patient Details  Name: Brenda Ortiz MRN: [id number] Date of Birth: June 13, 1941 Referring Provider (PT): Aundra Dubin, [place]   Encounter Date: 04/30/2020   PT End of Session - 04/30/20 1106    Visit Number 18    Number of Visits 23    Date for PT Re-Evaluation 05/17/20    Progress Note Due on Visit 20    PT Start Time 1104    PT Stop Time 1143    PT Time Calculation (min) 39 min    Activity Tolerance Patient tolerated treatment well    Behavior During Therapy [facility] for tasks assessed/performed           Past Medical History:  Diagnosis Date  . Arthritis   . Hypothyroidism   . Incontinence   . Seasonal allergies     Past Surgical History:  Procedure Laterality Date  . DILATION AND CURETTAGE OF UTERUS     x2  . FOOT SURGERY Right   . HYSTEROSCOPY WITH D & C N/A 08/14/2016   Procedure: DILATATION AND CURETTAGE /HYSTEROSCOPY;  Surgeon: Brien Few, MD;  Location: [facility];  Service: Gynecology;  Laterality: N/A;  . TONSILLECTOMY    . TOTAL HIP ARTHROPLASTY Left 02/03/2020   Procedure: LEFT TOTAL HIP ARTHROPLASTY ANTERIOR APPROACH;  Surgeon: Mcarthur Rossetti, MD;  Location: [facility];  Service: Orthopedics;  Laterality: Left;    There were no vitals filed for this visit.   Subjective Assessment - 04/30/20 1107    Subjective "I can tell I still feel something in the L hip but I do think I am making some progress. The exercises at home have been going pretty good"    Currently in Pain? Yes    Pain Location Hip    Pain Orientation Left    Pain Type Surgical pain    Pain Onset More than a month ago    Pain Frequency Intermittent              [facility] PT Assessment - 04/30/20 0001      Assessment   Medical Diagnosis Status post total hip replacement    Referring Provider (PT)  Nathaniel Man                         [facility] Adult PT Treatment/Exercise - 04/30/20 0001      Knee/Hip Exercises: Stretches   Hip Flexor Stretch 2 reps;Left;30 seconds   in      Knee/Hip Exercises: Aerobic   Nustep L7 x 7 min LE only   to promote endurnace     Knee/Hip Exercises: Standing   Forward Step Up 2 sets;20 reps;Step Height: 6"   LLE only using RUE for support in //   Functional Squat 1 set;15 reps   with bil    SLS with Vectors in // 1 x 15 4-way with red theraband LLE only      Knee/Hip Exercises: Seated   Other Seated Knee/Hip Exercises sit to stand 1 x 10 with RLE slightly advanced forward to place more emphasis on LLE    Sit to Sand 1 set;15 reps;with UE support   but utilizing controlled eccentric loading                   PT Short Term Goals - 04/02/20 1108  PT SHORT TERM GOAL #1   Title Patient will be able to independently initiate HEP    Period Weeks    Status Achieved      PT SHORT TERM GOAL #2   Title Patient will be able to demonstrate and verbalize appropriate posture    Period Weeks    Status Achieved      PT SHORT TERM GOAL #3   Title Patient will demonstrate an increased heel strike during initial contact in order to ambulate without an assistive device    Period Weeks    Status Achieved             PT Long Term Goals - 04/12/20 0857      PT LONG TERM GOAL #1   Title Patient will demsotrate 4+/5 gross bilateral hip strength in order to walk without pain    Period Weeks    Status On-going      PT LONG TERM GOAL #2   Title Patient will increase Lt. hip flexion and extension by >/= 10 degrees in order to navigate stairs in her home    Baseline L hip extension ipmroved by 7 degrees, increased hip flexion 97 today    Period Weeks    Status Partially Met      PT LONG TERM GOAL #3   Title Patient will demonstrate 4/5 Lt. hip extension and abduction in order to get back to exercising independently per the  patient's goals.    Period Weeks    Status On-going      PT LONG TERM GOAL #4   Title Patient will be independent with workout program to promote hip strength and stability.    Period Weeks    Status On-going                 Plan - 04/30/20 1143    Clinical Impression Statement pt continues to arrive without her [facility] and has been continuing her HEP at home. focused session on posterior chain strengthening with increased reps/ sets to promote strength and endurance. She responded well but did requring intermittne tactile cues for proper form to avoid compensation.    PT Next Visit Plan continue hip hinge mechanics for sit-stands, continue wall slides. progress standing exercise in WB and for AROM/strengthening, SLR, STW along the proximal hip PRN, update HEP PRN, sit-stand control, assess leg lengths, FOTO    PT Home Exercise Plan Clams, SLR, Hip Flexor Stretch, Isometric Adduction w/ glute bridge, bridge with clamshell, supine marching with resistance. wall squat    Consulted and Agree with Plan of Care Patient           Patient will benefit from skilled therapeutic intervention in order to improve the following deficits and impairments:  Abnormal gait, Decreased balance, Decreased coordination, Decreased mobility, Decreased range of motion, Decreased scar mobility, Decreased strength, Difficulty walking, Increased fascial restricitons, Impaired flexibility, Improper body mechanics, Postural dysfunction, Pain  Visit Diagnosis: Stiffness of left hip, not elsewhere classified  Hip pain  Weakness of both hips     Problem List Patient Active Problem List   Diagnosis Date Noted  . Status post total replacement of left hip 02/03/2020  . Unilateral primary osteoarthritis, left hip 12/01/2019  . Low back pain 10/27/2019  . Left hip pain 10/27/2019  . Right hip pain 07/14/2017  . Chest pain 06/11/2015  . Hypothyroidism 06/11/2015   Starr Lake PT, DPT, LAT, ATC   04/30/20  11:46 AM      [facility] [address] [place], [place], [address] Phone: [phone]   Fax:  [phone]  Name: Brenda Ortiz MRN: [id number] Date of Birth: [birth year]

## 2020-05-03 ENCOUNTER — Other Ambulatory Visit: Payer: Self-pay

## 2020-05-03 ENCOUNTER — Encounter: Payer: Self-pay | Admitting: Physical Therapy

## 2020-05-03 ENCOUNTER — Ambulatory Visit: Payer: Medicare PPO | Admitting: Physical Therapy

## 2020-05-03 DIAGNOSIS — M25559 Pain in unspecified hip: Secondary | ICD-10-CM

## 2020-05-03 DIAGNOSIS — M25652 Stiffness of left hip, not elsewhere classified: Secondary | ICD-10-CM

## 2020-05-03 DIAGNOSIS — R29898 Other symptoms and signs involving the musculoskeletal system: Secondary | ICD-10-CM

## 2020-05-03 NOTE — Therapy (Signed)
[place], [place], [address] Phone: [phone]   Fax:  [phone]  Physical Therapy Treatment  Patient Details  Name: Brenda Ortiz MRN: [id number] Date of Birth: 1941-01-23 Referring Provider (PT): Aundra Dubin, [place]   Encounter Date: 05/03/2020   PT End of Session - 05/03/20 1415    Visit Number 19    Number of Visits 23    Date for PT Re-Evaluation 05/17/20    Progress Note Due on Visit 20    PT Start Time 1415    PT Stop Time 1500    PT Time Calculation (min) 45 min    Activity Tolerance Patient tolerated treatment well    Behavior During Therapy [facility] for tasks assessed/performed           Past Medical History:  Diagnosis Date  . Arthritis   . Hypothyroidism   . Incontinence   . Seasonal allergies     Past Surgical History:  Procedure Laterality Date  . DILATION AND CURETTAGE OF UTERUS     x2  . FOOT SURGERY Right   . HYSTEROSCOPY WITH D & C N/A 08/14/2016   Procedure: DILATATION AND CURETTAGE /HYSTEROSCOPY;  Surgeon: Brien Few, MD;  Location: [facility];  Service: Gynecology;  Laterality: N/A;  . TONSILLECTOMY    . TOTAL HIP ARTHROPLASTY Left 02/03/2020   Procedure: LEFT TOTAL HIP ARTHROPLASTY ANTERIOR APPROACH;  Surgeon: Mcarthur Rossetti, MD;  Location: [facility];  Service: Orthopedics;  Laterality: Left;    There were no vitals filed for this visit.   Subjective Assessment - 05/03/20 1416    Subjective "i feel like I am doing pretty good. I can tell I definitely had a good workout last session, I still have issues with getting up quickly."    Patient Stated Goals Wants to be able to get up stairs and walking outside              [facility] PT Assessment - 05/03/20 0001      Assessment   Medical Diagnosis Status post total hip replacement    Referring Provider (PT) Aundra Dubin, PA-C      Observation/Other Assessments   Focus on Therapeutic Outcomes (FOTO)   35% limited      Special Tests    Special Tests Leg LengthTest    Leg length test  True;Apparent      True   Right 92.3 in.    Left  91.2 in.      Apparent   Right 100.5 in.    Left 99.4 in.                         [place] Adult PT Treatment/Exercise - 05/03/20 0001      Self-Care   Other Self-Care Comments  benefits of trialing heel lift in the L shoe as a result of LLD with the LLE being shorter      Therapeutic Activites    Other Therapeutic Activities sit to stand biomechanics using 10# kettlebell to help counter balance 1 x 10, followed with 1 x 10 with reaching forward       Knee/Hip Exercises: Stretches   Hip Flexor Stretch 2 reps;30 seconds      Knee/Hip Exercises: Aerobic   Nustep L7 x 6 min LE only      Knee/Hip Exercises: Machines for Strengthening   Hip Cybex 1 x 12 40#, 1 x 12 50#, 1 x  12 60#                  PT Education - 05/03/20 1500    Education Details updated HEP for sit to stand with bil UE reaching forward    Person(s) Educated Patient    Methods Explanation;Verbal cues;Handout    Comprehension Verbalized understanding;Verbal cues required            PT Short Term Goals - 04/02/20 1108      PT SHORT TERM GOAL #1   Title Patient will be able to independently initiate HEP    Period Weeks    Status Achieved      PT SHORT TERM GOAL #2   Title Patient will be able to demonstrate and verbalize appropriate posture    Period Weeks    Status Achieved      PT SHORT TERM GOAL #3   Title Patient will demonstrate an increased heel strike during initial contact in order to ambulate without an assistive device    Period Weeks    Status Achieved             PT Long Term Goals - 04/12/20 0857      PT LONG TERM GOAL #1   Title Patient will demsotrate 4+/5 gross bilateral hip strength in order to walk without pain    Period Weeks    Status On-going      PT LONG TERM GOAL #2   Title Patient will increase Lt. hip flexion  and extension by >/= 10 degrees in order to navigate stairs in her home    Baseline L hip extension ipmroved by 7 degrees, increased hip flexion 97 today    Period Weeks    Status Partially Met      PT LONG TERM GOAL #3   Title Patient will demonstrate 4/5 Lt. hip extension and abduction in order to get back to exercising independently per the patient's goals.    Period Weeks    Status On-going      PT LONG TERM GOAL #4   Title Patient will be independent with workout program to promote hip strength and stability.    Period Weeks    Status On-going                 Plan - 05/03/20 1434    Clinical Impression Statement pt reports she feels like she is making progress but still has issues with taking the first step after standing. She is improving her FOTO score to 35% limited. fruther assessment today revealed LLD both true and apparent with the LLE being shorter, heel lift was provided and pt noted feelin gmore balanced. practiced sit to stand biomechaincs using 10# kettlbe bell to help counter weight and she was able to stand without the use of her hands. Continued working sit to stand biomechanics uwing bil UE reaching to proomte counter balance which she was able to do without issues.    PT Next Visit Plan continue hip hinge mechanics for sit-stands, continue wall slides. progress standing exercise in WB and for AROM/strengthening, SLR, STW along the proximal hip PRN, update HEP PRN, sit-stand, hows heel lfit?    PT Home Exercise Plan Clams, SLR, Hip Flexor Stretch, Isometric Adduction w/ glute bridge, bridge with clamshell, supine marching with resistance. wall squat, sit to stand with reaching forward    Consulted and Agree with Plan of Care Patient           Patient will benefit  from skilled therapeutic intervention in order to improve the following deficits and impairments:  Abnormal gait, Decreased balance, Decreased coordination, Decreased mobility, Decreased range of  motion, Decreased scar mobility, Decreased strength, Difficulty walking, Increased fascial restricitons, Impaired flexibility, Improper body mechanics, Postural dysfunction, Pain  Visit Diagnosis: Stiffness of left hip, not elsewhere classified  Hip pain  Weakness of both hips     Problem List Patient Active Problem List   Diagnosis Date Noted  . Status post total replacement of left hip 02/03/2020  . Unilateral primary osteoarthritis, left hip 12/01/2019  . Low back pain 10/27/2019  . Left hip pain 10/27/2019  . Right hip pain 07/14/2017  . Chest pain 06/11/2015  . Hypothyroidism 06/11/2015    Starr Lake PT, DPT, LAT, ATC  05/03/20  3:12 PM      [facility] [address] [place], [place], [address] Phone: [phone]   Fax:  [phone]  Name: Brenda Ortiz MRN: [id number] Date of Birth: 1941/08/05

## 2020-05-07 ENCOUNTER — Other Ambulatory Visit: Payer: Self-pay

## 2020-05-07 ENCOUNTER — Ambulatory Visit: Payer: Medicare PPO | Admitting: Physical Therapy

## 2020-05-07 ENCOUNTER — Encounter: Payer: Self-pay | Admitting: Physical Therapy

## 2020-05-07 DIAGNOSIS — M25559 Pain in unspecified hip: Secondary | ICD-10-CM

## 2020-05-07 DIAGNOSIS — M25652 Stiffness of left hip, not elsewhere classified: Secondary | ICD-10-CM | POA: Diagnosis not present

## 2020-05-07 DIAGNOSIS — R29898 Other symptoms and signs involving the musculoskeletal system: Secondary | ICD-10-CM

## 2020-05-07 NOTE — Therapy (Signed)
[place], [place], [address] Phone: [phone]   Fax:  [phone]  Physical Therapy Treatment Progress Note Reporting Period 04/02/2020 to 05/07/2020  See note below for Objective Data and Assessment of Progress/Goals.       Patient Details  Name: Brenda Ortiz MRN: [id number] Date of Birth: 08/29/1941 Referring Provider (PT): Aundra Dubin, [place]   Encounter Date: 05/07/2020   PT End of Session - 05/07/20 1513    Visit Number 20    Number of Visits 23    Date for PT Re-Evaluation 05/17/20    Progress Note Due on Visit 30    PT Start Time 1500    PT Stop Time 1540    PT Time Calculation (min) 40 min    Activity Tolerance Patient tolerated treatment well    Behavior During Therapy [facility] for tasks assessed/performed           Past Medical History:  Diagnosis Date  . Arthritis   . Hypothyroidism   . Incontinence   . Seasonal allergies     Past Surgical History:  Procedure Laterality Date  . DILATION AND CURETTAGE OF UTERUS     x2  . FOOT SURGERY Right   . HYSTEROSCOPY WITH D & C N/A 08/14/2016   Procedure: DILATATION AND CURETTAGE /HYSTEROSCOPY;  Surgeon: Brien Few, MD;  Location: [facility];  Service: Gynecology;  Laterality: N/A;  . TONSILLECTOMY    . TOTAL HIP ARTHROPLASTY Left 02/03/2020   Procedure: LEFT TOTAL HIP ARTHROPLASTY ANTERIOR APPROACH;  Surgeon: Mcarthur Rossetti, MD;  Location: [facility];  Service: Orthopedics;  Laterality: Left;    There were no vitals filed for this visit.   Subjective Assessment - 05/07/20 1511    Subjective "I can tell the heel lift is really helping, I feel like I have turned a corner"    Patient Stated Goals Wants to be able to get up stairs and walking outside    Currently in Pain? Yes    Pain Score 1     Pain Location Hip    Pain Orientation Left    Pain Descriptors / Indicators Aching;Tightness    Pain Type Chronic pain    Pain  Onset More than a month ago    Pain Frequency Intermittent    Aggravating Factors  getting up and walking after sitting for alittle bit    Pain Relieving Factors stretch              [facility] PT Assessment - 05/07/20 0001      Assessment   Medical Diagnosis Status post total hip replacement    Referring Provider (PT) Nathaniel Man                         [facility] Adult PT Treatment/Exercise - 05/07/20 0001      Knee/Hip Exercises: Aerobic   Nustep L7 x 7 min LE only    to increase endurance     Knee/Hip Exercises: Machines for Strengthening   Hip Cybex 1 x 12 40#, 1 x 12 50#      Knee/Hip Exercises: Seated   Sit to Sand 2 sets;10 reps   with arms reaching forward     Knee/Hip Exercises: Supine   Straight Leg Raises 2 sets;15 reps;Left;Strengthening      Knee/Hip Exercises: Sidelying   Hip ABduction 1 set;15 reps;Left;Strengthening  PT Short Term Goals - 05/07/20 1550      PT SHORT TERM GOAL #1   Title Patient will be able to independently initiate HEP    Period Weeks    Status Achieved      PT SHORT TERM GOAL #2   Title Patient will be able to demonstrate and verbalize appropriate posture    Period Weeks    Status Achieved             PT Long Term Goals - 05/07/20 1550      PT LONG TERM GOAL #1   Title Patient will demsotrate 4+/5 gross bilateral hip strength in order to walk without pain    Period Weeks    Status On-going      PT LONG TERM GOAL #2   Title Patient will increase Lt. hip flexion and extension by >/= 10 degrees in order to navigate stairs in her home    Period Weeks    Status Partially Met      PT LONG TERM GOAL #3   Title Patient will demonstrate 4/5 Lt. hip extension and abduction in order to get back to exercising independently per the patient's goals.    Period Weeks    Status On-going      PT LONG TERM GOAL #4   Title Patient will be independent with workout program to promote hip  strength and stability.    Period Weeks    Status On-going                 Plan - 05/07/20 1547    Clinical Impression Statement pt arrived reporting improvement in her gait and notes the heel lift is helping. She did demonstrate improvement today with sit to stand transitions with limited to no use of her hands. Continued working hip flexor/ abductor strengthening which she did fatigue quickly with. reivewed importance of doing these exercises at home in order to improve.    PT Next Visit Plan continue hip hinge mechanics for sit-stands, continue wall slides. progress standing exercise in WB and for AROM/strengthening, SLR, STW along the proximal hip PRN, update HEP PRN, sit-stand,    PT Home Exercise Plan Clams, SLR, Hip Flexor Stretch, Isometric Adduction w/ glute bridge, bridge with clamshell, supine marching with resistance. wall squat, sit to stand with reaching forward    Consulted and Agree with Plan of Care Patient           Patient will benefit from skilled therapeutic intervention in order to improve the following deficits and impairments:  Abnormal gait, Decreased balance, Decreased coordination, Decreased mobility, Decreased range of motion, Decreased scar mobility, Decreased strength, Difficulty walking, Increased fascial restricitons, Impaired flexibility, Improper body mechanics, Postural dysfunction, Pain  Visit Diagnosis: Stiffness of left hip, not elsewhere classified  Hip pain  Weakness of both hips     Problem List Patient Active Problem List   Diagnosis Date Noted  . Status post total replacement of left hip 02/03/2020  . Unilateral primary osteoarthritis, left hip 12/01/2019  . Low back pain 10/27/2019  . Left hip pain 10/27/2019  . Right hip pain 07/14/2017  . Chest pain 06/11/2015  . Hypothyroidism 06/11/2015   Starr Lake PT, DPT, LAT, ATC  05/07/20  3:53 PM      [facility] [address] [place], [place], [address] Phone: [phone]   Fax:  [phone]  Name: AVIONA MARTENSON MRN: [id number] Date of Birth: 01-17-1941

## 2020-05-10 ENCOUNTER — Encounter: Payer: Self-pay | Admitting: Physical Therapy

## 2020-05-10 ENCOUNTER — Other Ambulatory Visit: Payer: Self-pay

## 2020-05-10 ENCOUNTER — Ambulatory Visit: Payer: Medicare PPO | Admitting: Physical Therapy

## 2020-05-10 DIAGNOSIS — M25652 Stiffness of left hip, not elsewhere classified: Secondary | ICD-10-CM | POA: Diagnosis not present

## 2020-05-10 DIAGNOSIS — M25559 Pain in unspecified hip: Secondary | ICD-10-CM

## 2020-05-10 DIAGNOSIS — R29898 Other symptoms and signs involving the musculoskeletal system: Secondary | ICD-10-CM

## 2020-05-10 NOTE — Therapy (Signed)
[place], [place], [address] Phone: [phone]   Fax:  [phone]  Physical Therapy Treatment  Patient Details  Name: Brenda Ortiz MRN: [id number] Date of Birth: [birth year] Referring Provider (PT): Aundra Dubin, [place]   Encounter Date: 05/10/2020   PT End of Session - 05/10/20 0930    Visit Number 21    Number of Visits 23    Date for PT Re-Evaluation 05/17/20    Progress Note Due on Visit 30    PT Start Time 0930    PT Stop Time 1020    PT Time Calculation (min) 50 min    Activity Tolerance Patient tolerated treatment well    Behavior During Therapy [facility] for tasks assessed/performed           Past Medical History:  Diagnosis Date  . Arthritis   . Hypothyroidism   . Incontinence   . Seasonal allergies     Past Surgical History:  Procedure Laterality Date  . DILATION AND CURETTAGE OF UTERUS     x2  . FOOT SURGERY Right   . HYSTEROSCOPY WITH D & C N/A 08/14/2016   Procedure: DILATATION AND CURETTAGE /HYSTEROSCOPY;  Surgeon: Brien Few, MD;  Location: [place];  Service: Gynecology;  Laterality: N/A;  . TONSILLECTOMY    . TOTAL HIP ARTHROPLASTY Left 02/03/2020   Procedure: LEFT TOTAL HIP ARTHROPLASTY ANTERIOR APPROACH;  Surgeon: Mcarthur Rossetti, MD;  Location: [facility];  Service: Orthopedics;  Laterality: Left;    There were no vitals filed for this visit.   Subjective Assessment - 05/10/20 0930    Subjective "I did the exercises at home that I was instructed to do. I am alittle sore from the previous session but its probably from doing the exercises."    Patient Stated Goals Wants to be able to get up stairs and walking outside    Currently in Pain? No/denies    Pain Score 0-No pain    Pain Orientation Left              [facility] PT Assessment - 05/10/20 0001      Assessment   Medical Diagnosis Status post total hip replacement    Referring Provider (PT) Nathaniel Man                         [facility] Adult PT Treatment/Exercise - 05/10/20 0001      Knee/Hip Exercises: Stretches   Hip Flexor Stretch 2 reps;30 seconds;Left      Knee/Hip Exercises: Aerobic   Tread Mill 4 min ramp L1 x .67mp x 2 min and .8 x 2 min    with strap and bil HHA for safety   Nustep L5 x 434m LE only      Knee/Hip Exercises: Standing   Forward Lunges 10 reps;3 sets    Forward Lunges Limitations touching down knee down on to 10 in. step and 2 inch airex pad (total 12 inch) x 1 set, 1 set with 10 inches, 1 set with 8 inches   next to table and with CGA for safety     Knee/Hip Exercises: Seated   Sit to Sand 1 set;15 reps      Knee/Hip Exercises: Supine   Straight Leg Raises 20 reps;2 sets;Left;Strengthening      Knee/Hip Exercises: Sidelying   Hip ABduction 1 set;20 reps    Clams 1 x 20  progressed following sidelying hip abduction for endurance     Cryotherapy   Number Minutes Cryotherapy 10 Minutes    Cryotherapy Location Hip    Type of Cryotherapy Ice pack                  PT Education - 05/10/20 1015    Education Details updated today for standing mini lunge    Person(s) Educated Patient    Methods Explanation;Verbal cues;Handout    Comprehension Verbalized understanding;Verbal cues required            PT Short Term Goals - 05/07/20 1550      PT SHORT TERM GOAL #1   Title Patient will be able to independently initiate HEP    Period Weeks    Status Achieved      PT SHORT TERM GOAL #2   Title Patient will be able to demonstrate and verbalize appropriate posture    Period Weeks    Status Achieved             PT Long Term Goals - 05/07/20 1550      PT LONG TERM GOAL #1   Title Patient will demsotrate 4+/5 gross bilateral hip strength in order to walk without pain    Period Weeks    Status On-going      PT LONG TERM GOAL #2   Title Patient will increase Lt. hip flexion and extension by >/= 10 degrees in  order to navigate stairs in her home    Period Weeks    Status Partially Met      PT LONG TERM GOAL #3   Title Patient will demonstrate 4/5 Lt. hip extension and abduction in order to get back to exercising independently per the patient's goals.    Period Weeks    Status On-going      PT LONG TERM GOAL #4   Title Patient will be independent with workout program to promote hip strength and stability.    Period Weeks    Status On-going                 Plan - 05/10/20 0954    Clinical Impression Statement pt notes soreness from exercises previous session but otherwise reports no pain. continued working on hip/ knee strengthening with increased reps. progress sit to stand in session to modified mini lunge touching down on to 12 inch. step and taperning to a 8 inch step. She does well with exercises but continues to report intermittent soreness along the proximal hip with standing/ stepping. discussed with pt the next few visits are likely going to be our last visits to work in functional strengtheing, update her HEP, address any questions and discharge to her home HEP.    PT Treatment/Interventions ADLs/Self Care Home Management;Cryotherapy;Electrical Stimulation;Iontophoresis 45m/ml Dexamethasone;Moist Heat;Traction;Ultrasound;Gait training;Stair training;Functional mobility training;Therapeutic activities;Therapeutic exercise;Balance training;Neuromuscular re-education;Cognitive remediation;Patient/family education;Manual techniques;Compression bandaging;Scar mobilization;Passive range of motion;Dry needling;Energy conservation;Splinting;Visual/perceptual remediation/compensation;Joint Manipulations    PT Next Visit Plan funcitonal strengthening/ endurance. progress standing exercise in WB and for AROM/strengthening, SLR, STW along the proximal hip PRN,    PT Home Exercise Plan Clams, SLR, Hip Flexor Stretch, Isometric Adduction w/ glute bridge, bridge with clamshell, supine marching with  resistance. wall squat, sit to stand with reaching forward, mini lunge    Consulted and Agree with Plan of Care Patient           Patient will benefit from skilled therapeutic intervention in order to improve the following deficits and impairments:  Abnormal gait, Decreased balance, Decreased coordination, Decreased mobility, Decreased range of motion, Decreased scar mobility, Decreased strength, Difficulty walking, Increased fascial restricitons, Impaired flexibility, Improper body mechanics, Postural dysfunction, Pain  Visit Diagnosis: Stiffness of left hip, not elsewhere classified  Hip pain  Weakness of both hips     Problem List Patient Active Problem List   Diagnosis Date Noted  . Status post total replacement of left hip 02/03/2020  . Unilateral primary osteoarthritis, left hip 12/01/2019  . Low back pain 10/27/2019  . Left hip pain 10/27/2019  . Right hip pain 07/14/2017  . Chest pain 06/11/2015  . Hypothyroidism 06/11/2015   Starr Lake PT, DPT, LAT, ATC  05/10/20  10:17 AM      [facility] [address] [place], [place], [address] Phone: [phone]   Fax:  [phone]  Name: SAMA ARAUZ MRN: [id number] Date of Birth: 06-30-1941

## 2020-05-14 ENCOUNTER — Encounter: Payer: Self-pay | Admitting: Physical Therapy

## 2020-05-14 ENCOUNTER — Ambulatory Visit: Payer: Medicare PPO | Admitting: Physical Therapy

## 2020-05-14 ENCOUNTER — Other Ambulatory Visit: Payer: Self-pay

## 2020-05-14 DIAGNOSIS — M25652 Stiffness of left hip, not elsewhere classified: Secondary | ICD-10-CM | POA: Diagnosis not present

## 2020-05-14 DIAGNOSIS — M25559 Pain in unspecified hip: Secondary | ICD-10-CM

## 2020-05-14 DIAGNOSIS — R29898 Other symptoms and signs involving the musculoskeletal system: Secondary | ICD-10-CM

## 2020-05-14 NOTE — Therapy (Signed)
[place], [place], [address] Phone: [phone]   Fax:  [phone]  Physical Therapy Treatment  Patient Details  Name: Brenda Ortiz MRN: [id number] Date of Birth: [birth year] Referring Provider (PT): Aundra Dubin, [place]   Encounter Date: 05/14/2020   PT End of Session - 05/14/20 0931    Visit Number 22    Number of Visits 23    Date for PT Re-Evaluation 05/17/20    PT Start Time 0931    PT Stop Time 1012    PT Time Calculation (min) 41 min    Activity Tolerance Patient tolerated treatment well    Behavior During Therapy [facility] for tasks assessed/performed           Past Medical History:  Diagnosis Date   Arthritis    Hypothyroidism    Incontinence    Seasonal allergies     Past Surgical History:  Procedure Laterality Date   DILATION AND CURETTAGE OF UTERUS     x2   FOOT SURGERY Right    HYSTEROSCOPY WITH D & C N/A 08/14/2016   Procedure: DILATATION AND CURETTAGE /HYSTEROSCOPY;  Surgeon: Brien Few, MD;  Location: [place] [facility];  Service: Gynecology;  Laterality: N/A;   TONSILLECTOMY     TOTAL HIP ARTHROPLASTY Left 02/03/2020   Procedure: LEFT TOTAL HIP ARTHROPLASTY ANTERIOR APPROACH;  Surgeon: Mcarthur Rossetti, MD;  Location: [facility];  Service: Orthopedics;  Laterality: Left;    There were no vitals filed for this visit.   Subjective Assessment - 05/14/20 0932    Subjective " I have been doing pretty good, I do feel stiff and agrrivated today"    Currently in Pain? Yes    Pain Score 1     Pain Orientation Left    Pain Descriptors / Indicators Aching    Pain Type Chronic pain    Pain Onset More than a month ago    Pain Frequency Occasional    Aggravating Factors  getting up              [facility] PT Assessment - 05/14/20 0001      Assessment   Medical Diagnosis Status post total hip replacement    Referring Provider (PT) Nathaniel Man                          [facility] Adult PT Treatment/Exercise - 05/14/20 8677      Knee/Hip Exercises: Stretches   Hip Flexor Stretch 2 reps;30 seconds;Left      Knee/Hip Exercises: Aerobic   Nustep L7 x 7 min LE only      Knee/Hip Exercises: Machines for Strengthening   Total Gym Leg Press --      Knee/Hip Exercises: Standing   Forward Lunges 2 sets;Both;10 reps   Halted after 3 reps due to pt noting fatigue and difficulty   SLS with Vectors 1 x 15 4-way with red theraband LLE only      Knee/Hip Exercises: Seated   Sit to Sand 1 set;15 reps      Knee/Hip Exercises: Supine   Straight Leg Raises 1 set;Strengthening;20 reps      Knee/Hip Exercises: Sidelying   Hip ABduction Left;Strengthening;1 set;20 reps               Balance Exercises - 05/14/20 0001      Balance Exercises: Standing   Standing Eyes Opened Narrow base of support (BOS);2 reps;30 secs  with pertubations               PT Short Term Goals - 05/07/20 1550      PT SHORT TERM GOAL #1   Title Patient will be able to independently initiate HEP    Period Weeks    Status Achieved      PT SHORT TERM GOAL #2   Title Patient will be able to demonstrate and verbalize appropriate posture    Period Weeks    Status Achieved             PT Long Term Goals - 05/07/20 1550      PT LONG TERM GOAL #1   Title Patient will demsotrate 4+/5 gross bilateral hip strength in order to walk without pain    Period Weeks    Status On-going      PT LONG TERM GOAL #2   Title Patient will increase Lt. hip flexion and extension by >/= 10 degrees in order to navigate stairs in her home    Period Weeks    Status Partially Met      PT LONG TERM GOAL #3   Title Patient will demonstrate 4/5 Lt. hip extension and abduction in order to get back to exercising independently per the patient's goals.    Period Weeks    Status On-going      PT LONG TERM GOAL #4   Title Patient will be independent with workout program  to promote hip strength and stability.    Period Weeks    Status On-going                 Plan - 05/14/20 6160    Clinical Impression Statement pt arrived noting increased stiffness today. Focused session continued hip/ knee strengthening with increased reps to promtoe endurnace. She did well with exercises but does continue to fatigue with hip abduction. plan to see pt for 1 more visit to update HEP and dicsharge to independent exercise.    PT Treatment/Interventions ADLs/Self Care Home Management;Cryotherapy;Electrical Stimulation;Iontophoresis 21m/ml Dexamethasone;Moist Heat;Traction;Ultrasound;Gait training;Stair training;Functional mobility training;Therapeutic activities;Therapeutic exercise;Balance training;Neuromuscular re-education;Cognitive remediation;Patient/family education;Manual techniques;Compression bandaging;Scar mobilization;Passive range of motion;Dry needling;Energy conservation;Splinting;Visual/perceptual remediation/compensation;Joint Manipulations    PT Next Visit Plan ROM, goals, FOTO, update HEP PRN for balance and 4-way hip, D/C    PT Home Exercise Plan Clams, SLR, Hip Flexor Stretch, Isometric Adduction w/ glute bridge, bridge with clamshell, supine marching with resistance. wall squat, sit to stand with reaching forward, mini lunge    Consulted and Agree with Plan of Care Patient           Patient will benefit from skilled therapeutic intervention in order to improve the following deficits and impairments:  Abnormal gait, Decreased balance, Decreased coordination, Decreased mobility, Decreased range of motion, Decreased scar mobility, Decreased strength, Difficulty walking, Increased fascial restricitons, Impaired flexibility, Improper body mechanics, Postural dysfunction, Pain  Visit Diagnosis: Stiffness of left hip, not elsewhere classified  Hip pain  Weakness of both hips     Problem List Patient Active Problem List   Diagnosis Date Noted    Status post total replacement of left hip 02/03/2020   Unilateral primary osteoarthritis, left hip 12/01/2019   Low back pain 10/27/2019   Left hip pain 10/27/2019   Right hip pain 07/14/2017   Chest pain 06/11/2015   Hypothyroidism 06/11/2015    KStarr LakePT, DPT, LAT, ATC  05/14/20  10:15 AM      [facility]  [place], [place], [address] Phone: [phone]   Fax:  [phone]  Name: Brenda Ortiz MRN: [id number] Date of Birth: [birth year]

## 2020-05-16 ENCOUNTER — Encounter: Payer: Self-pay | Admitting: Physical Therapy

## 2020-05-16 ENCOUNTER — Ambulatory Visit: Payer: Medicare PPO | Admitting: Physical Therapy

## 2020-05-16 ENCOUNTER — Other Ambulatory Visit: Payer: Self-pay

## 2020-05-16 DIAGNOSIS — M25652 Stiffness of left hip, not elsewhere classified: Secondary | ICD-10-CM

## 2020-05-16 DIAGNOSIS — M25559 Pain in unspecified hip: Secondary | ICD-10-CM

## 2020-05-16 DIAGNOSIS — R29898 Other symptoms and signs involving the musculoskeletal system: Secondary | ICD-10-CM

## 2020-05-16 NOTE — Therapy (Signed)
[place], [place], [address] Phone: [phone]   Fax:  [phone]  Physical Therapy Treatment / discharge  Patient Details  Name: Brenda Ortiz MRN: [id number] Date of Birth: [birth year] Referring Provider (PT): Aundra Dubin, [place]   Encounter Date: 05/16/2020   PT End of Session - 05/16/20 0934    Visit Number 23    Number of Visits 23    Date for PT Re-Evaluation 05/17/20    PT Start Time 0931    PT Stop Time 1009    PT Time Calculation (min) 38 min    Activity Tolerance Patient tolerated treatment well    Behavior During Therapy [facility] for tasks assessed/performed           Past Medical History:  Diagnosis Date  . Arthritis   . Hypothyroidism   . Incontinence   . Seasonal allergies     Past Surgical History:  Procedure Laterality Date  . DILATION AND CURETTAGE OF UTERUS     x2  . FOOT SURGERY Right   . HYSTEROSCOPY WITH D & C N/A 08/14/2016   Procedure: DILATATION AND CURETTAGE /HYSTEROSCOPY;  Surgeon: Brien Few, MD;  Location: [facility];  Service: Gynecology;  Laterality: N/A;  . TONSILLECTOMY    . TOTAL HIP ARTHROPLASTY Left 02/03/2020   Procedure: LEFT TOTAL HIP ARTHROPLASTY ANTERIOR APPROACH;  Surgeon: Mcarthur Rossetti, MD;  Location: [facility];  Service: Orthopedics;  Laterality: Left;    There were no vitals filed for this visit.       [facility] PT Assessment - 05/16/20 0001      Assessment   Medical Diagnosis Status post total hip replacement    Referring Provider (PT) Aundra Dubin, PA-C      Observation/Other Assessments   Focus on Therapeutic Outcomes (FOTO)  28% limited      AROM   Left Hip Flexion 95      Strength   Right Hip Flexion 4/5    Right Hip ABduction 4-/5    Left Hip Flexion 4/5    Left Hip ABduction 4-/5                         [facility] Adult PT Treatment/Exercise - 05/16/20 0001      Knee/Hip Exercises: Aerobic   Nustep  L5 x 5 min LE only      Knee/Hip Exercises: Standing   Stairs navigating up/ down 6in ch step x 5   cues for proper form                 PT Education - 05/16/20 0949    Education Details reviewed HEP and discussed how to progress strength/ endurnace with increased reps/ sets and resistance. reviewed final assessment finding,s and final FOTO assessment.    Person(s) Educated Patient    Methods Explanation;Verbal cues;Handout    Comprehension Verbalized understanding;Verbal cues required            PT Short Term Goals - 05/07/20 1550      PT SHORT TERM GOAL #1   Title Patient will be able to independently initiate HEP    Period Weeks    Status Achieved      PT SHORT TERM GOAL #2   Title Patient will be able to demonstrate and verbalize appropriate posture    Period Weeks    Status Achieved  PT Long Term Goals - 05/16/20 0941      PT LONG TERM GOAL #1   Title Patient will demsotrate 4+/5 gross bilateral hip strength in order to walk without pain    Period Weeks    Status Partially Met      PT LONG TERM GOAL #2   Title Patient will increase Lt. hip flexion and extension by >/= 10 degrees in order to navigate stairs in her home    Period Weeks    Status Partially Met      PT LONG TERM GOAL #3   Title Patient will demonstrate 4/5 Lt. hip extension and abduction in order to get back to exercising independently per the patient's goals.    Period Weeks    Status Partially Met      PT LONG TERM GOAL #4   Title Patient will be independent with workout program to promote hip strength and stability.    Period Weeks    Status Achieved                 Plan - 05/16/20 1001    Clinical Impression Statement Mrs Shirkey has made great progress with physical therapy increasing hip ROM and strength. She continues to report stiffness and feeling tightness int he front of the hip initally after a sit to stand transition. she met or partially met all  goals today. She did well with all exercises today and is able to maintain and progress her current level of function independenlty and will be discharged from PT today.    PT Treatment/Interventions ADLs/Self Care Home Management;Cryotherapy;Electrical Stimulation;Iontophoresis 66m/ml Dexamethasone;Moist Heat;Traction;Ultrasound;Gait training;Stair training;Functional mobility training;Therapeutic activities;Therapeutic exercise;Balance training;Neuromuscular re-education;Cognitive remediation;Patient/family education;Manual techniques;Compression bandaging;Scar mobilization;Passive range of motion;Dry needling;Energy conservation;Splinting;Visual/perceptual remediation/compensation;Joint Manipulations    PT Next Visit Plan D/C    PT Home Exercise Plan Clams, SLR, Hip Flexor Stretch, Isometric Adduction w/ glute bridge, bridge with clamshell, supine marching with resistance. wall squat, sit to stand with reaching forward, mini lunge    Consulted and Agree with Plan of Care Patient           Patient will benefit from skilled therapeutic intervention in order to improve the following deficits and impairments:  Abnormal gait, Decreased balance, Decreased coordination, Decreased mobility, Decreased range of motion, Decreased scar mobility, Decreased strength, Difficulty walking, Increased fascial restricitons, Impaired flexibility, Improper body mechanics, Postural dysfunction, Pain  Visit Diagnosis: Stiffness of left hip, not elsewhere classified  Hip pain  Weakness of both hips     Problem List Patient Active Problem List   Diagnosis Date Noted  . Status post total replacement of left hip 02/03/2020  . Unilateral primary osteoarthritis, left hip 12/01/2019  . Low back pain 10/27/2019  . Left hip pain 10/27/2019  . Right hip pain 07/14/2017  . Chest pain 06/11/2015  . Hypothyroidism 06/11/2015   KStarr LakePT, DPT, LAT, ATC  05/16/20  10:10 AM      [facility] [address]Phone: [phone]  Fax:  [phone] Name: Brenda FRECHMRN: [id number] of Birth: [birth year]     PHYSICAL THERAPY DISCHARGE SUMMARY  Visits from Start of Care: 23  Current functional level related to goals / functional outcomes: See goals, FOTO 28% limited    Remaining deficits: See assessment above   Education / Equipment: HEP, theraband, posture / and lifting mechanics. Gait and stair training.   Plan: Patient agrees to discharge.  Patient goals were partially met. Patient is being discharged due to meeting the stated rehab goals.  ?????        Qasim Diveley PT, DPT, LAT, ATC  05/16/20  10:12 AM

## 2020-07-16 ENCOUNTER — Encounter: Payer: Self-pay | Admitting: Orthopaedic Surgery

## 2020-07-16 ENCOUNTER — Ambulatory Visit: Payer: Medicare PPO | Admitting: Orthopaedic Surgery

## 2020-07-16 ENCOUNTER — Ambulatory Visit (INDEPENDENT_AMBULATORY_CARE_PROVIDER_SITE_OTHER): Payer: Medicare PPO

## 2020-07-16 DIAGNOSIS — Z96642 Presence of left artificial hip joint: Secondary | ICD-10-CM | POA: Diagnosis not present

## 2020-07-16 NOTE — Progress Notes (Signed)
The patient is now 5 months status post a left total hip arthroplasty.  She says she has little bit of stiffening when she first gets up and take steps but is able to walk it off.  She has some stiffness in her right hip as well but no right hip groin pain.  She says otherwise she is doing well.  On examination I can put her right hip and left hip through internal ex rotation with no blocks rotation and no symptoms on that exam.  Most of her stiffness and pain seems to be over the muscles around both hips.  AP pelvis and lateral of the left hip shows a well-seated total hip arthroplasty on the left side.  Her right hip appears to have a nice and round concentric joint space with no significant arthritic findings at all.  She will continue to increase her activities as comfort allows.  I can see her back in 6 months which will be close to 1 year out from surgery.  X-rays are only needed if she is having problems.

## 2020-10-31 ENCOUNTER — Ambulatory Visit (INDEPENDENT_AMBULATORY_CARE_PROVIDER_SITE_OTHER): Payer: Medicare PPO

## 2020-10-31 ENCOUNTER — Encounter: Payer: Self-pay | Admitting: Physician Assistant

## 2020-10-31 ENCOUNTER — Ambulatory Visit: Payer: Medicare PPO | Admitting: Physician Assistant

## 2020-10-31 DIAGNOSIS — M25551 Pain in right hip: Secondary | ICD-10-CM | POA: Diagnosis not present

## 2020-10-31 MED ORDER — LIDOCAINE HCL 1 % IJ SOLN
3.0000 mL | INTRAMUSCULAR | Status: AC | PRN
  Administered 2020-10-31: 3 mL

## 2020-10-31 MED ORDER — METHYLPREDNISOLONE ACETATE 40 MG/ML IJ SUSP
40.0000 mg | INTRAMUSCULAR | Status: AC | PRN
  Administered 2020-10-31: 40 mg via INTRA_ARTICULAR

## 2020-10-31 NOTE — Progress Notes (Signed)
Office Visit Note   Patient: Brenda Ortiz           Date of Birth: 1940-12-29           MRN: [id number] Visit Date: 10/31/2020              Requested by: Chilton Greathouse, MD [address] [place],  [place] [address] PCP: Chilton Greathouse, MD   Assessment & Plan: Visit Diagnoses:  1. Pain in right hip     Plan: She shown IT band stretching exercises.  Like to see her back in 3 weeks.  If she is doing well she can call and cancel the appointment.  Neck step would be either an MRI of her right hip to evaluate cartilage versus an intra-articular injection of the right hip I did discuss these possible interventions with her today.  Questions encouraged and answered.  Follow-Up Instructions: Return in about 3 weeks (around 11/21/2020).   Orders:  Orders Placed This Encounter  Procedures  . Large Joint Inj  . XR HIP UNILAT W OR W/O PELVIS 2-3 VIEWS RIGHT   No orders of the defined types were placed in this encounter.     Procedures: Large Joint Inj: R greater trochanter on 10/31/2020 5:41 PM Indications: pain Details: 22 G 1.5 in needle, lateral approach  Arthrogram: No  Medications: 3 mL lidocaine 1 %; 40 mg methylPREDNISolone acetate 40 MG/ML Outcome: tolerated well, no immediate complications Procedure, treatment alternatives, risks and benefits explained, specific risks discussed. Consent was given by the patient. Immediately prior to procedure a time out was called to verify the correct patient, procedure, equipment, support staff and site/side marked as required. Patient was prepped and draped in the usual sterile fashion.       Clinical Data: No additional findings.   Subjective: Chief Complaint  Patient presents with  . Right Hip - Pain    HPI  Brenda Ortiz comes in today with right hip pain.  Is been ongoing for the past couple of months.  She is well-known to Dr. Eliberto Ivory service with a history of a left total hip arthroplasty.  Left total hip is  doing well.  She has had no new injury to the right hip.  She has pain when walking.  Also has pain of the lateral aspect of the hip.  Describes the pain in the lateral and anterior aspect of the hip no numbness tingling.  Said no fevers chills.  She is nondiabetic. Review of Systems See HPI otherwise negative or noncontributory  Objective: Vital Signs: There were no vitals taken for this visit.  Physical Exam General: Well-developed well-nourished female no acute distress.  Ambulates without any assistive device. Ortho Exam Right hip fluid range of motion without pain.  She has tenderness over the trochanteric region of both hips.  Slight decreased internal and external rotation of left hip.  Right knee full range of motion without pain.  Nontender with palpation about the right knee. Specialty Comments:  No specialty comments available.  Imaging: XR HIP UNILAT W OR W/O PELVIS 2-3 VIEWS RIGHT  Result Date: 10/31/2020 AP pelvis lateral view of the right hip.  Right hip appears well-preserved.  No acute fractures or bony abnormalities.  Left total hip arthroplasty components appear well-seated.  Both hips are well located.    PMFS History: Patient Active Problem List   Diagnosis Date Noted  . Status post total replacement of left hip 02/03/2020  . Unilateral primary osteoarthritis, left hip 12/01/2019  .  Low back pain 10/27/2019  . Left hip pain 10/27/2019  . Right hip pain 07/14/2017  . Chest pain 06/11/2015  . Hypothyroidism 06/11/2015   Past Medical History:  Diagnosis Date  . Arthritis   . Hypothyroidism   . Incontinence   . Seasonal allergies     Family History  Problem Relation Age of Onset  . Arthritis Mother   . Arthritis Father   . Diabetes Father   . Diabetes Brother   . Diabetes Paternal Grandmother     Past Surgical History:  Procedure Laterality Date  . DILATION AND CURETTAGE OF UTERUS     x2  . FOOT SURGERY Right   . HYSTEROSCOPY WITH D & C N/A  08/14/2016   Procedure: DILATATION AND CURETTAGE /HYSTEROSCOPY;  Surgeon: Olivia Mackie, MD;  Location: [facility];  Service: Gynecology;  Laterality: N/A;  . TONSILLECTOMY    . TOTAL HIP ARTHROPLASTY Left 02/03/2020   Procedure: LEFT TOTAL HIP ARTHROPLASTY ANTERIOR APPROACH;  Surgeon: Kathryne Hitch, MD;  Location: [facility];  Service: Orthopedics;  Laterality: Left;   Social History   Occupational History  . Not on file  Tobacco Use  . Smoking status: Former Smoker    Packs/day: 0.50    Years: 10.00    Pack years: 5.00    Types: Cigarettes    Quit date: 11/17/1970    Years since quitting: 49.9  . Smokeless tobacco: Never Used  Vaping Use  . Vaping Use: Never used  Substance and Sexual Activity  . Alcohol use: Yes    Alcohol/week: 0.0 standard drinks    Comment: 2-3 times a week  . Drug use: No  . Sexual activity: Not on file

## 2020-11-17 DIAGNOSIS — M5126 Other intervertebral disc displacement, lumbar region: Secondary | ICD-10-CM

## 2020-11-17 HISTORY — DX: Other intervertebral disc displacement, lumbar region: M51.26

## 2020-11-22 ENCOUNTER — Other Ambulatory Visit: Payer: Self-pay

## 2020-11-22 ENCOUNTER — Encounter: Payer: Self-pay | Admitting: Orthopaedic Surgery

## 2020-11-22 ENCOUNTER — Ambulatory Visit (INDEPENDENT_AMBULATORY_CARE_PROVIDER_SITE_OTHER): Payer: Medicare PPO | Admitting: Orthopaedic Surgery

## 2020-11-22 DIAGNOSIS — M25551 Pain in right hip: Secondary | ICD-10-CM

## 2020-11-22 NOTE — Progress Notes (Signed)
The patient comes in today for continued follow-up as a relates to right hip pain.  She still denies any groin pain.  She is a very active 80 year old female.  When she last saw our clinic 3 weeks ago she had a trochanteric injection which helped her quite a bit.  She still having pain that radiates down her thigh but it seems to be more IT band related.  On exam I can easily put her right hip through internal and external rotation without any difficulty at all.  Her pain is mainly over the lateral hip and thigh area.  She has negative straight leg raise as well.  I would like to set her up for outpatient physical therapy for any modalities that can help decrease the pain around her right lateral hip and IT band area.  She is a very active individual and walks quite a bit and this is certainly slowing down her ability to mobilize and walk the way she would like to.  She agrees with trying outpatient physical therapy.  I will then see her back in about 6 weeks to see if this is helped.

## 2020-11-28 ENCOUNTER — Ambulatory Visit: Payer: Medicare PPO | Admitting: Physical Therapy

## 2020-12-07 ENCOUNTER — Ambulatory Visit: Payer: Medicare PPO | Admitting: Rehabilitative and Restorative Service Providers"

## 2020-12-12 ENCOUNTER — Ambulatory Visit: Payer: Medicare PPO | Admitting: Rehabilitative and Restorative Service Providers"

## 2020-12-12 ENCOUNTER — Encounter: Payer: Self-pay | Admitting: Rehabilitative and Restorative Service Providers"

## 2020-12-12 ENCOUNTER — Other Ambulatory Visit: Payer: Self-pay

## 2020-12-12 DIAGNOSIS — R2681 Unsteadiness on feet: Secondary | ICD-10-CM

## 2020-12-12 DIAGNOSIS — M6281 Muscle weakness (generalized): Secondary | ICD-10-CM | POA: Diagnosis not present

## 2020-12-12 DIAGNOSIS — R262 Difficulty in walking, not elsewhere classified: Secondary | ICD-10-CM

## 2020-12-12 DIAGNOSIS — M25551 Pain in right hip: Secondary | ICD-10-CM | POA: Diagnosis not present

## 2020-12-12 NOTE — Therapy (Signed)
[facility] [address] [place], [place], [address] Phone: [phone]   Fax:  [phone]  Physical Therapy Evaluation  Patient Details  Name: Brenda Ortiz MRN: [id number] Date of Birth: 09-22-1941 Referring Provider (PT): Virgia Land. Dub Mikes PA-C  Referring diagnosis? M25.551 Treatment diagnosis? (if different than referring diagnosis) R26.2  M62.81  R26.81 What was this (referring dx) caused by? []  Surgery []  Fall [x]  Ongoing issue []  Arthritis []  Other: ____________  Laterality: [x]  Rt []  Lt []  Both  Check all possible CPT codes:      [x]  97110 (Therapeutic Exercise)  []  92507 (SLP Treatment)  [x]  97112 (Neuro Re-ed)   []  92526 (Swallowing Treatment)   [x]  97116 (Gait Training)   []  (Cognitive Training, 1st 15 minutes) [x]  97140 (Manual Therapy)   []  97130 (Cognitive Training, each add'l 15 minutes)  [x]  97530 (Therapeutic Activities)  []  Other, List CPT Code ____________    [x]  (Self Care)       [x]  All codes above (97110 - 97535)  []  97012 (Mechanical Traction)  []  97014 (E-stim Unattended)  []  97032 (E-stim manual)  []  97033 (Ionto)  []  97035 (Ultrasound)  []  97760 (Orthotic Fit) [x]  97750 (Physical Performance Training) []  (Aquatic Therapy) []  (Contrast Bath) []  (Paraffin) []  97597 (Wound Care 1st 20 sq cm) []  97598 (Wound Care each add'l 20 sq cm) []  97016 (Vasopneumatic Device) []  (Orthotic Training) []  (Prosthetic Training)  Encounter Date: 12/12/2020   PT End of Session - 12/12/20 1456    Visit Number 1    Number of Visits 16    Authorization Type [employer]    PT Start Time 1259    PT Stop Time 1346    PT Time Calculation (min) 47 min    Activity Tolerance Patient tolerated treatment well;Patient limited by fatigue    Behavior During Therapy WFL for tasks assessed/performed           Past Medical History:  Diagnosis Date  . Arthritis   . Hypothyroidism   .  Incontinence   . Seasonal allergies     Past Surgical History:  Procedure Laterality Date  . DILATION AND CURETTAGE OF UTERUS     x2  . FOOT SURGERY Right   . HYSTEROSCOPY WITH D & C N/A 08/14/2016   Procedure: DILATATION AND CURETTAGE /HYSTEROSCOPY;  Surgeon: , MD;  Location: [facility];  Service: Gynecology;  Laterality: N/A;  . TONSILLECTOMY    . TOTAL HIP ARTHROPLASTY Left 02/03/2020   Procedure: LEFT TOTAL HIP ARTHROPLASTY ANTERIOR APPROACH;  Surgeon: , MD;  Location: [facility];  Service: Orthopedics;  Laterality: Left;    There were no vitals filed for this visit.    Subjective Assessment - 12/12/20 1452    Subjective Brenda Ortiz has a 3+ month istory of increasing R hip pain.  She got some relief with a cortisone injection.  She would like to return to her walking for exercise.  She is currently riding a stationary bike for 30 minutes a day.    Limitations Standing;Walking    How long can you sit comfortably? Transfer from sit to stand is difficult    How long can you stand comfortably? < 15 minutes    How long can you walk comfortably? Walking is not comfortable    Patient Stated Goals Return to walking for exercise without pain    Currently in Pain? Yes    Pain Score 5  Pain Location Hip    Pain Orientation Right    Pain Descriptors / Indicators Aching;Tightness;Sore    Pain Type Chronic pain    Pain Radiating Towards NA    Pain Onset More than a month ago    Pain Frequency Intermittent    Aggravating Factors  Change of position and WB    Pain Relieving Factors Medication and a cortisone shot    Effect of Pain on Daily Activities Unable to walk for exercise and limited WB endurance    Multiple Pain Sites No              [facility] PT Assessment - 12/12/20 0001      Assessment   Medical Diagnosis R hip joint pain    Referring Provider (PT) Mary L. Dub Mikes PA-C    Onset Date/Surgical Date --   Several (3+) months   Prior Therapy Had  prior PT after a L THA in 2021      Balance Screen   Has the patient fallen in the past 6 months Yes    How many times? 1    Has the patient had a decrease in activity level because of a fear of falling?  Yes    Is the patient reluctant to leave their home because of a fear of falling?  No      Home Environment   Living Environment Private residence    Type of Home House    Additional Comments 3 flights of stairs      Prior Function   Level of Independence Independent      Cognition   Overall Cognitive Status Within Functional Limits for tasks assessed      Observation/Other Assessments   Focus on Therapeutic Outcomes (FOTO)  59 (75 Goal)      ROM / Strength   AROM / PROM / Strength AROM;Strength      AROM   Overall AROM  Deficits    AROM Assessment Site Hip    Right/Left Hip Left;Right    Right Hip Flexion 105    Right Hip External Rotation  32    Right Hip Internal Rotation  4    Left Hip Flexion 100    Left Hip External Rotation  28    Left Hip Internal Rotation  5      Strength   Overall Strength Deficits    Strength Assessment Site Hip    Right/Left Hip Left;Right    Left Hip Flexion 3+/5    Left Hip ABduction 3+/5      Flexibility   Soft Tissue Assessment /Muscle Length yes    Hamstrings 45 degrees/50 degrees      Ambulation/Gait   Gait Comments Wide based gait with occasional loss of balance                      Objective measurements completed on examination: See above findings.       [facility] Adult PT Treatment/Exercise - 12/12/20 0001      Neuro Re-ed    Neuro Re-ed Details  Heel to toe balance wider stance 5X 30 seconds each foot in front      Exercises   Exercises Knee/Hip      Knee/Hip Exercises: Stretches   Other Knee/Hip Stretches Gluteal stretch (knee to opposite shoulder) 5X 20 seconds      Knee/Hip Exercises: Sidelying   Hip ABduction Strengthening;Both;2 sets;5 reps    Hip ADduction Limitations 1/4 turn to stomach  PT Education - 12/12/20 1456    Education Details Discussed exam findings, hip anatomy and reviewed her starter HEP.    Person(s) Educated Patient    Methods Explanation;Demonstration;Tactile cues;Verbal cues;Handout    Comprehension Verbal cues required;Need further instruction;Returned demonstration;Verbalized understanding;Tactile cues required               PT Long Term Goals - 12/12/20 1502      PT LONG TERM GOAL #1   Title Brenda Ortiz will score 75 on the FOTO functional scale.    Baseline 59    Time 8    Period Weeks    Status New    Target Date 02/15/21      PT LONG TERM GOAL #2   Title Improve hip strength to 4+/5 MMT B for hip flexors and abductors.    Baseline 3+/5    Time 8    Period Weeks    Status New    Target Date 02/15/21      PT LONG TERM GOAL #3   Title Brenda Ortiz will return to walking 1 mile or more with her home walking program.    Baseline Unable due to R hip pain.    Time 8    Period Weeks    Status New    Target Date 02/15/21      PT LONG TERM GOAL #4   Title Brenda Ortiz will report R hip pain consistently 0-2/10 on the Numeric Pain Rating Scale.    Baseline Can be 5/10    Time 8    Period Weeks    Status New    Target Date 02/15/21      PT LONG TERM GOAL #5   Title Brenda Ortiz will be independent with her long-term maintenence HEP at DC.    Time 8    Period Weeks    Status New    Target Date 02/15/21                  Plan - 12/12/20 1457    Clinical Impression Statement Brenda Ortiz is very physically active but she has been slowed by increasing R hip pain over the past several months.  Although she got a week's relief with a cortisone injection, she has not been able to improve to a level acceptable to return to walking.  Hip abductors weakness and poor balance (wide based gait) were most noticeable during examination.  Hip AROM to address mild impairments will be combined with the focus on hip abductors (and  general LE) strengthening and balnce work to return Brenda Ortiz to her previous level of function.    Personal Factors and Comorbidities Comorbidity 1    Comorbidities L THA, very weak    Examination-Activity Limitations Transfers;Bed Mobility;Squat;Lift;Locomotion Level;Stairs;Stand    Examination-Participation Restrictions Interpersonal Relationship;Yard Work;Community Activity    Stability/Clinical Decision Making Stable/Uncomplicated    Clinical Decision Making Low    Rehab Potential Good    PT Frequency 2x / week    PT Duration 8 weeks    PT Treatment/Interventions ADLs/Self Care Home Management;Iontophoresis 4mg /ml Dexamethasone;Therapeutic activities;Stair training;Gait training;Therapeutic exercise;Balance training;Neuromuscular re-education;Patient/family education;Dry needling    PT Next Visit Plan Progress leg strengthening and balance with special emphasis on hip abductors    PT Home Exercise Plan Access Code: [email or web address]    Consulted and Agree with Plan of Care Patient           Patient will benefit from skilled therapeutic intervention in order to improve the following deficits and impairments:  Abnormal  gait,Decreased activity tolerance,Decreased balance,Decreased endurance,Decreased range of motion,Decreased strength,Difficulty walking,Pain  Visit Diagnosis: Difficulty walking  Muscle weakness (generalized)  Pain in right hip  Unsteadiness on feet     Problem List Patient Active Problem List   Diagnosis Date Noted  . Status post total replacement of left hip 02/03/2020  . Unilateral primary osteoarthritis, left hip 12/01/2019  . Low back pain 10/27/2019  . Left hip pain 10/27/2019  . Right hip pain 07/14/2017  . Chest pain 06/11/2015  . Hypothyroidism 06/11/2015    Cherlyn Cushing PT, MPT 12/12/2020, 3:06 PM  [facility] [address] [place], [place], [address] Phone: [phone]   Fax:  [phone]  Name: Brenda Ortiz MRN: [id number] Date of Birth: 05/04/1941

## 2020-12-12 NOTE — Patient Instructions (Signed)
Access Code: [email or web address] URL: [email or web address] Date: 12/12/2020 Prepared by: Pauletta Browns  Exercises Tandem Stance - 2-3 x daily - 7 x weekly - 1 sets - 5 reps - 20 second hold Supine Gluteus Stretch - 2-3 x daily - 7 x weekly - 1 sets - 5 reps - 20 seconds hold Sidelying Hip Abduction - 2-3 x daily - 7 x weekly - 2 sets - 5 reps - 3 seconds hold

## 2020-12-14 ENCOUNTER — Ambulatory Visit: Payer: Medicare PPO | Admitting: Physical Therapy

## 2020-12-14 ENCOUNTER — Other Ambulatory Visit: Payer: Self-pay

## 2020-12-14 ENCOUNTER — Encounter: Payer: Medicare PPO | Admitting: Rehabilitative and Restorative Service Providers"

## 2020-12-14 DIAGNOSIS — R2681 Unsteadiness on feet: Secondary | ICD-10-CM | POA: Diagnosis not present

## 2020-12-14 DIAGNOSIS — R262 Difficulty in walking, not elsewhere classified: Secondary | ICD-10-CM

## 2020-12-14 DIAGNOSIS — M25652 Stiffness of left hip, not elsewhere classified: Secondary | ICD-10-CM

## 2020-12-14 DIAGNOSIS — R29898 Other symptoms and signs involving the musculoskeletal system: Secondary | ICD-10-CM

## 2020-12-14 DIAGNOSIS — M6281 Muscle weakness (generalized): Secondary | ICD-10-CM | POA: Diagnosis not present

## 2020-12-14 DIAGNOSIS — M25551 Pain in right hip: Secondary | ICD-10-CM | POA: Diagnosis not present

## 2020-12-14 DIAGNOSIS — M25559 Pain in unspecified hip: Secondary | ICD-10-CM

## 2020-12-14 NOTE — Therapy (Signed)
[facility] [address] [place], [place], [address] Phone: [phone]   Fax:  [phone]  Physical Therapy Treatment  Patient Details  Name: Brenda Ortiz MRN: [id number] Date of Birth: 10/18/1941 Referring Provider (PT): Virgia Land. Dub Mikes PA-C   Encounter Date: 12/14/2020   PT End of Session - 12/14/20 1216    Visit Number 2    Number of Visits 16    Authorization Type [employer]    PT Start Time 1109    PT Stop Time 1147    PT Time Calculation (min) 38 min    Activity Tolerance Patient tolerated treatment well    Behavior During Therapy WFL for tasks assessed/performed           Past Medical History:  Diagnosis Date  . Arthritis   . Hypothyroidism   . Incontinence   . Seasonal allergies     Past Surgical History:  Procedure Laterality Date  . DILATION AND CURETTAGE OF UTERUS     x2  . FOOT SURGERY Right   . HYSTEROSCOPY WITH D & C N/A 08/14/2016   Procedure: DILATATION AND CURETTAGE /HYSTEROSCOPY;  Surgeon: Olivia Mackie, MD;  Location: [facility];  Service: Gynecology;  Laterality: N/A;  . TONSILLECTOMY    . TOTAL HIP ARTHROPLASTY Left 02/03/2020   Procedure: LEFT TOTAL HIP ARTHROPLASTY ANTERIOR APPROACH;  Surgeon: Kathryne Hitch, MD;  Location: [facility];  Service: Orthopedics;  Laterality: Left;    There were no vitals filed for this visit.   Subjective Assessment - 12/14/20 1114    Subjective Patient reports feeling that her leg is there but no true pain. Does reports difficulty with steps and side to side movement.    Limitations Standing;Walking    How long can you sit comfortably? Transfer from sit to stand is difficult    How long can you stand comfortably? < 15 minutes    How long can you walk comfortably? Walking is not comfortable    Patient Stated Goals Return to walking for exercise without pain    Currently in Pain? No/denies    Pain Onset More than a month ago            [facility] Adult PT  Treatment/Exercise - 12/14/20 0001      Knee/Hip Exercises: Aerobic   Nustep 8 min L3 seat 7 no UE      Knee/Hip Exercises: Standing   Stairs 4'' step up forward(with 1 hand UE) and lateral 15x(with double UE)    Other Standing Knee Exercises IT band stretch staggered stance lean toward wall 10 sec R leg; side stepping with constant UE, tandem balance bilat 4x 20 sec, standing maching with UE 20x      Knee/Hip Exercises: Seated   Knee/Hip Flexion seated marching 10 each leg 3 sec hold    Sit to Sand 10 reps;without UE support   26 in height     Knee/Hip Exercises: Sidelying   Clams 2 sets of 10 in Lt sidelying            PT Long Term Goals - 12/12/20 1502      PT LONG TERM GOAL #1   Title [name] will score 75 on the FOTO functional scale.    Baseline 59    Time 8    Period Weeks    Status New    Target Date 02/15/21      PT LONG TERM GOAL #2   Title Improve hip strength to 4+/5 MMT B  for hip flexors and abductors.    Baseline 3+/5    Time 8    Period Weeks    Status New    Target Date 02/15/21      PT LONG TERM GOAL #3   Title [name] will return to walking 1 mile or more with her home walking program.    Baseline Unable due to R hip pain.    Time 8    Period Weeks    Status New    Target Date 02/15/21      PT LONG TERM GOAL #4   Title [name] will report R hip pain consistently 0-2/10 on the Numeric Pain Rating Scale.    Baseline Can be 5/10    Time 8    Period Weeks    Status New    Target Date 02/15/21      PT LONG TERM GOAL #5   Title [name] will be independent with her long-term maintenence HEP at [place].    Time 8    Period Weeks    Status New    Target Date 02/15/21                 Plan - 12/14/20 1209    Clinical Impression Statement Patient added more strengthening actvities today and tolerated them well. She notes difficulty with steps in her home and getting out of a chair wihtout using her hands. Introducing progressive strength  activities that mimic her functional activities will assist in building her leg strength. She is doing well with her home exercises, check in with adding more exercises for her to do at home to continue strengthening progress. She can benefit from doing exercises bilaterally from her recent L THA. She also can benefit from added balance activities to increase her dynamic stability. Patient has slight dizziness from moving into sidelying, will monitor this to see if this is a continued issue to assess vestibular.    Personal Factors and Comorbidities Comorbidity 1    Comorbidities L THA, very weak    Examination-Activity Limitations Transfers;Bed Mobility;Squat;Lift;Locomotion Level;Stairs;Stand    Examination-Participation Restrictions Interpersonal Relationship;Yard Work;Community Activity    Stability/Clinical Decision Making Stable/Uncomplicated    Rehab Potential Good    PT Frequency 2x / week    PT Duration 8 weeks    PT Treatment/Interventions ADLs/Self Care Home Management;Iontophoresis 4mg /ml Dexamethasone;Therapeutic activities;Stair training;Gait training;Therapeutic exercise;Balance training;Neuromuscular re-education;Patient/family education;Dry needling    PT Next Visit Plan Progress leg strengthening and balance with special emphasis on hip abductors, update HEP, balance progression    PT Home Exercise Plan Access Code:    Consulted and Agree with Plan of Care Patient           Patient will benefit from skilled therapeutic intervention in order to improve the following deficits and impairments:  Abnormal gait,Decreased activity tolerance,Decreased balance,Decreased endurance,Decreased range of motion,Decreased strength,Difficulty walking,Pain  Visit Diagnosis: Difficulty walking  Muscle weakness (generalized)  Pain in right hip  Unsteadiness on feet  Stiffness of left hip, not elsewhere classified  Hip pain  Weakness of both hips     Problem List Patient  Active Problem List   Diagnosis Date Noted  . Status post total replacement of left hip 02/03/2020  . Unilateral primary osteoarthritis, left hip 12/01/2019  . Low back pain 10/27/2019  . Left hip pain 10/27/2019  . Right hip pain 07/14/2017  . Chest pain 06/11/2015  . Hypothyroidism 06/11/2015    06/13/2015, SPT 12/14/2020, 12:41 PM   During  this treatment session, this physical therapist was present, participating in and directing the treatment.   This note has been reviewed and this clinician agrees with the information provided.     April Manson, PT 12/14/2020 12:44 PM    [facility] [address] [place], [place], [address] Phone: [phone]   Fax:  [phone]  Name: Brenda Ortiz MRN: [id number] Date of Birth: Jan 07, 1941

## 2020-12-20 ENCOUNTER — Encounter: Payer: Self-pay | Admitting: Rehabilitative and Restorative Service Providers"

## 2020-12-20 ENCOUNTER — Other Ambulatory Visit: Payer: Self-pay

## 2020-12-20 ENCOUNTER — Ambulatory Visit: Payer: Medicare PPO | Admitting: Rehabilitative and Restorative Service Providers"

## 2020-12-20 DIAGNOSIS — M6281 Muscle weakness (generalized): Secondary | ICD-10-CM

## 2020-12-20 DIAGNOSIS — R262 Difficulty in walking, not elsewhere classified: Secondary | ICD-10-CM

## 2020-12-20 DIAGNOSIS — R2681 Unsteadiness on feet: Secondary | ICD-10-CM

## 2020-12-20 NOTE — Patient Instructions (Signed)
Continue HEP from day 1

## 2020-12-20 NOTE — Therapy (Signed)
[facility] Physical Therapy [address] [place], [place], [address] Phone: [phone]   Fax:  [phone]  Physical Therapy Treatment  Patient Details  Name: Brenda Ortiz MRN: [id number] Date of Birth: November 25, 1940 Referring Provider (PT): Virgia Land. Dub Mikes PA-C   Encounter Date: 12/20/2020   PT End of Session - 12/20/20 1623    Visit Number 3    Number of Visits 16    Authorization Type [employer]    Progress Note Due on Visit 10    PT Start Time 1435    PT Stop Time 1515    PT Time Calculation (min) 40 min    Equipment Utilized During Treatment Gait belt    Activity Tolerance Patient tolerated treatment well;No increased pain;Patient limited by fatigue    Behavior During Therapy [facility] for tasks assessed/performed           Past Medical History:  Diagnosis Date  . Arthritis   . Hypothyroidism   . Incontinence   . Seasonal allergies     Past Surgical History:  Procedure Laterality Date  . DILATION AND CURETTAGE OF UTERUS     x2  . FOOT SURGERY Right   . HYSTEROSCOPY WITH D & C N/A 08/14/2016   Procedure: DILATATION AND CURETTAGE /HYSTEROSCOPY;  Surgeon: Olivia Mackie, MD;  Location: [facility];  Service: Gynecology;  Laterality: N/A;  . TONSILLECTOMY    . TOTAL HIP ARTHROPLASTY Left 02/03/2020   Procedure: LEFT TOTAL HIP ARTHROPLASTY ANTERIOR APPROACH;  Surgeon: Kathryne Hitch, MD;  Location: [facility];  Service: Orthopedics;  Laterality: Left;    There were no vitals filed for this visit.   Subjective Assessment - 12/20/20 1620    Subjective Brenda Ortiz reports balance and instability are her biggest concerns.    Limitations Standing;Walking    How long can you sit comfortably? Transfer from sit to stand is difficult    How long can you stand comfortably? < 15 minutes    How long can you walk comfortably? Walking is not comfortable    Patient Stated Goals Return to walking for exercise without pain    Currently in Pain? No/denies    Pain  Onset More than a month ago                             [facility] Adult PT Treatment/Exercise - 12/20/20 0001      Ambulation/Gait   Gait Comments Wide based gait with occasional loss of balance      Therapeutic Activites    Therapeutic Activities ADL's    ADL's Step-up and over 6 inch with 1 UE on rail and slow eccentrics and 1 inch step no hands and slow eccentrics      Neuro Re-ed    Neuro Re-ed Details  Heel to toe balance static and dynamic focus on narrow base and step-stretegy vs grabbing with hands      Exercises   Exercises Knee/Hip      Knee/Hip Exercises: Stretches   Other Knee/Hip Stretches Gluteal stretch (knee to opposite shoulder) 5X 20 seconds      Knee/Hip Exercises: Sidelying   Hip ABduction Strengthening;Both;2 sets;5 reps    Hip ADduction Limitations 1/4 turn to stomach                  PT Education - 12/20/20 1622    Education Details Reviewed Brenda Ortiz's HEP.  Progressed clinic program for strength, balance and proprioception with emphasis on  step strategy.    Person(s) Educated Patient    Methods Explanation;Demonstration;Tactile cues;Verbal cues    Comprehension Verbalized understanding;Tactile cues required;Need further instruction;Returned demonstration;Verbal cues required               PT Long Term Goals - 12/20/20 1623      PT LONG TERM GOAL #1   Title Brenda Ortiz will score 75 on the FOTO functional scale.    Baseline 59    Time 8    Period Weeks    Status On-going      PT LONG TERM GOAL #2   Title Improve hip strength to 4+/5 MMT B for hip flexors and abductors.    Baseline 3+/5    Time 8    Period Weeks    Status On-going      PT LONG TERM GOAL #3   Title Brenda Ortiz will return to walking 1 mile or more with her home walking program.    Baseline Unable due to R hip pain.    Time 8    Period Weeks    Status On-going      PT LONG TERM GOAL #4   Title Brenda Ortiz will report R hip pain consistently 0-2/10 on  the Numeric Pain Rating Scale.    Baseline Can be 5/10    Time 8    Period Weeks    Status On-going      PT LONG TERM GOAL #5   Title Brenda Ortiz will be independent with her long-term maintenence HEP at DC.    Time 8    Period Weeks    Status On-going                 Plan - 12/20/20 1624    Clinical Impression Statement Brenda Ortiz has a wide based gait and is apprehensive about her balance.  We spent quite a bit of time focused on dynamic strength, balance and reaction time/step strategy to improve Brenda Ortiz gait quality, efficiency (narrow base) and balance.  Continue hip strength and balance work to meet LTGs.    Personal Factors and Comorbidities Comorbidity 1    Comorbidities L THA, very weak    Examination-Activity Limitations Transfers;Bed Mobility;Squat;Lift;Locomotion Level;Stairs;Stand    Examination-Participation Restrictions Interpersonal Relationship;Yard Work;Community Activity    Stability/Clinical Decision Making Stable/Uncomplicated    Rehab Potential Good    PT Frequency 2x / week    PT Duration 8 weeks    PT Treatment/Interventions ADLs/Self Care Home Management;Iontophoresis 4mg /ml Dexamethasone;Therapeutic activities;Stair training;Gait training;Therapeutic exercise;Balance training;Neuromuscular re-education;Patient/family education;Dry needling    PT Next Visit Plan Progress leg strengthening and balance with special emphasis on hip abductors, update HEP, balance progression    PT Home Exercise Plan Access Code:    Consulted and Agree with Plan of Care Patient           Patient will benefit from skilled therapeutic intervention in order to improve the following deficits and impairments:  Abnormal gait,Decreased activity tolerance,Decreased balance,Decreased endurance,Decreased range of motion,Decreased strength,Difficulty walking,Pain  Visit Diagnosis: Unsteadiness on feet  Muscle weakness (generalized)  Difficulty walking     Problem  List Patient Active Problem List   Diagnosis Date Noted  . Status post total replacement of left hip 02/03/2020  . Unilateral primary osteoarthritis, left hip 12/01/2019  . Low back pain 10/27/2019  . Left hip pain 10/27/2019  . Right hip pain 07/14/2017  . Chest pain 06/11/2015  . Hypothyroidism 06/11/2015    06/13/2015 PT, MPT 12/20/2020, 4:27 PM  [facility] Physical Therapy [address] [place], [place], [address] Phone: [phone]   Fax:  [phone]  Name: Brenda Ortiz MRN: [id number] Date of Birth: Jan 11, 1941

## 2020-12-21 ENCOUNTER — Encounter: Payer: Self-pay | Admitting: Rehabilitative and Restorative Service Providers"

## 2020-12-21 ENCOUNTER — Ambulatory Visit: Payer: Medicare PPO | Admitting: Rehabilitative and Restorative Service Providers"

## 2020-12-21 DIAGNOSIS — R2681 Unsteadiness on feet: Secondary | ICD-10-CM | POA: Diagnosis not present

## 2020-12-21 DIAGNOSIS — R262 Difficulty in walking, not elsewhere classified: Secondary | ICD-10-CM

## 2020-12-21 DIAGNOSIS — M6281 Muscle weakness (generalized): Secondary | ICD-10-CM

## 2020-12-21 NOTE — Therapy (Signed)
[facility] Physical Therapy [address] [place], [place], [address] Phone: [phone]   Fax:  [phone]  Physical Therapy Treatment  Patient Details  Name: Brenda Ortiz MRN: [id number] Date of Birth: 11/05/1941 Referring Provider (PT): Virgia Land. Dub Mikes PA-C   Encounter Date: 12/21/2020   PT End of Session - 12/21/20 1121    Visit Number 4    Number of Visits 16    Authorization Type [employer]    Progress Note Due on Visit 10    PT Start Time 0933    PT Stop Time 1016    PT Time Calculation (min) 43 min    Equipment Utilized During Treatment Gait belt    Activity Tolerance Patient tolerated treatment well;No increased pain;Patient limited by fatigue    Behavior During Therapy [facility] for tasks assessed/performed           Past Medical History:  Diagnosis Date  . Arthritis   . Hypothyroidism   . Incontinence   . Seasonal allergies     Past Surgical History:  Procedure Laterality Date  . DILATION AND CURETTAGE OF UTERUS     x2  . FOOT SURGERY Right   . HYSTEROSCOPY WITH D & C N/A 08/14/2016   Procedure: DILATATION AND CURETTAGE /HYSTEROSCOPY;  Surgeon: Olivia Mackie, MD;  Location: [facility];  Service: Gynecology;  Laterality: N/A;  . TONSILLECTOMY    . TOTAL HIP ARTHROPLASTY Left 02/03/2020   Procedure: LEFT TOTAL HIP ARTHROPLASTY ANTERIOR APPROACH;  Surgeon: Kathryne Hitch, MD;  Location: [facility];  Service: Orthopedics;  Laterality: Left;    There were no vitals filed for this visit.   Subjective Assessment - 12/21/20 1002    Subjective Brenda Ortiz reports minimal soreness or fatigue from last visit.    Limitations Standing;Walking    How long can you sit comfortably? Transfer from sit to stand is difficult    How long can you stand comfortably? < 15 minutes    How long can you walk comfortably? Walking is not comfortable    Patient Stated Goals Return to walking for exercise without pain    Currently in Pain? No/denies    Pain Onset  More than a month ago    Multiple Pain Sites No                             [facility] Adult PT Treatment/Exercise - 12/21/20 0001      Therapeutic Activites    Therapeutic Activities ADL's    ADL's Obstacle course with narrow based gait, step-over 1 inch step and step-up and over 4 and 6 inch step NO hands      Neuro Re-ed    Neuro Re-ed Details  Heel to toe balance static and dynamic focus on narrow base and step-stretegy vs grabbing with hands      Exercises   Exercises Knee/Hip      Knee/Hip Exercises: Stretches   Other Knee/Hip Stretches Gluteal stretch (knee to opposite shoulder) 5X 20 seconds      Knee/Hip Exercises: Seated   Sit to Sand 5 reps;without UE support;Other (comment)   slow eccentrics     Knee/Hip Exercises: Sidelying   Hip ABduction Strengthening;Both;2 sets;5 reps    Hip ADduction Limitations 1/4 turn to stomach                  PT Education - 12/21/20 1120    Education Details Reviewed and progressed clinic program.  Added sit to stand to HEP.    Person(s) Educated Patient    Methods Explanation;Demonstration;Tactile cues;Verbal cues;Handout    Comprehension Verbal cues required;Returned demonstration;Need further instruction;Verbalized understanding;Tactile cues required               PT Long Term Goals - 12/20/20 1623      PT LONG TERM GOAL #1   Title Brenda Ortiz will score 75 on the FOTO functional scale.    Baseline 59    Time 8    Period Weeks    Status On-going      PT LONG TERM GOAL #2   Title Improve hip strength to 4+/5 MMT B for hip flexors and abductors.    Baseline 3+/5    Time 8    Period Weeks    Status On-going      PT LONG TERM GOAL #3   Title Brenda Ortiz will return to walking 1 mile or more with her home walking program.    Baseline Unable due to R hip pain.    Time 8    Period Weeks    Status On-going      PT LONG TERM GOAL #4   Title Brenda Ortiz will report R hip pain consistently 0-2/10 on the  Numeric Pain Rating Scale.    Baseline Can be 5/10    Time 8    Period Weeks    Status On-going      PT LONG TERM GOAL #5   Title Brenda Ortiz will be independent with her long-term maintenence HEP at DC.    Time 8    Period Weeks    Status On-going                 Plan - 12/21/20 1121    Clinical Impression Statement Brenda Ortiz presnts today with progress with her wide-based gait and increased ability to use step strategy.  She still uses her hands more than necessary with balance activities, although we did increase the challenge today.  Continue balance, gait, strength and functional work to meet long-term goals established at evaluation.    Personal Factors and Comorbidities Comorbidity 1    Comorbidities L THA, very weak    Examination-Activity Limitations Transfers;Bed Mobility;Squat;Lift;Locomotion Level;Stairs;Stand    Examination-Participation Restrictions Interpersonal Relationship;Yard Work;Community Activity    Stability/Clinical Decision Making Stable/Uncomplicated    Rehab Potential Good    PT Frequency 2x / week    PT Duration 8 weeks    PT Treatment/Interventions ADLs/Self Care Home Management;Iontophoresis 4mg /ml Dexamethasone;Therapeutic activities;Stair training;Gait training;Therapeutic exercise;Balance training;Neuromuscular re-education;Patient/family education;Dry needling    PT Next Visit Plan Progress leg strengthening and balance with special emphasis on hip abductors, update HEP, balance progression    PT Home Exercise Plan Access Code:    Consulted and Agree with Plan of Care Patient           Patient will benefit from skilled therapeutic intervention in order to improve the following deficits and impairments:  Abnormal gait,Decreased activity tolerance,Decreased balance,Decreased endurance,Decreased range of motion,Decreased strength,Difficulty walking,Pain  Visit Diagnosis: Unsteadiness on feet  Muscle weakness (generalized)  Difficulty  walking     Problem List Patient Active Problem List   Diagnosis Date Noted  . Status post total replacement of left hip 02/03/2020  . Unilateral primary osteoarthritis, left hip 12/01/2019  . Low back pain 10/27/2019  . Left hip pain 10/27/2019  . Right hip pain 07/14/2017  . Chest pain 06/11/2015  . Hypothyroidism 06/11/2015    06/13/2015 PT,  MPT 12/21/2020, 11:24 AM  [facility] Physical Therapy [address] [place], [place], [address] Phone: [phone]   Fax:  [phone]  Name: Brenda Ortiz MRN: [id number] Date of Birth: 11/05/1941

## 2020-12-21 NOTE — Patient Instructions (Signed)
Access Code: [email or web address] URL: [email or web address] Date: 12/21/2020 Prepared by: Javarius Tsosie  Exercises Sit to Stand with Armchair - 1-2 x daily - 7 x weekly - 1 sets - 5 reps Tandem Stance - 2 x daily - 7 x weekly - 1 sets - 5 reps - 20 second hold Supine Gluteus Stretch - 2 x daily - 7 x weekly - 1 sets - 5 reps - 20 seconds hold Sidelying Hip Abduction - 2 x daily - 7 x weekly - 1 sets - 10 reps - 3 seconds hold  

## 2020-12-25 ENCOUNTER — Ambulatory Visit: Payer: Medicare PPO | Admitting: Physical Therapy

## 2020-12-25 ENCOUNTER — Other Ambulatory Visit: Payer: Self-pay

## 2020-12-25 ENCOUNTER — Encounter: Payer: Self-pay | Admitting: Physical Therapy

## 2020-12-25 DIAGNOSIS — M25559 Pain in unspecified hip: Secondary | ICD-10-CM | POA: Diagnosis not present

## 2020-12-25 DIAGNOSIS — R29898 Other symptoms and signs involving the musculoskeletal system: Secondary | ICD-10-CM

## 2020-12-25 DIAGNOSIS — R2681 Unsteadiness on feet: Secondary | ICD-10-CM

## 2020-12-25 DIAGNOSIS — M25551 Pain in right hip: Secondary | ICD-10-CM | POA: Diagnosis not present

## 2020-12-25 DIAGNOSIS — M6281 Muscle weakness (generalized): Secondary | ICD-10-CM | POA: Diagnosis not present

## 2020-12-25 DIAGNOSIS — M25652 Stiffness of left hip, not elsewhere classified: Secondary | ICD-10-CM

## 2020-12-25 DIAGNOSIS — R262 Difficulty in walking, not elsewhere classified: Secondary | ICD-10-CM

## 2020-12-25 NOTE — Therapy (Signed)
[facility] Physical Therapy [address] [place], [place], [address] Phone: [phone]   Fax:  [phone]  Physical Therapy Treatment  Patient Details  Name: Brenda Ortiz MRN: [id number] Date of Birth: August 07, 1941 Referring Provider (PT): Virgia Land. Dub Mikes PA-C   Encounter Date: 12/25/2020   PT End of Session - 12/25/20 1035    Visit Number 5    Number of Visits 16    Authorization Type [employer]    Progress Note Due on Visit 10    PT Start Time 1019    PT Stop Time 1059    PT Time Calculation (min) 40 min    Equipment Utilized During Treatment Gait belt    Activity Tolerance Patient tolerated treatment well;No increased pain;Patient limited by fatigue    Behavior During Therapy [facility] for tasks assessed/performed           Past Medical History:  Diagnosis Date  . Arthritis   . Hypothyroidism   . Incontinence   . Seasonal allergies     Past Surgical History:  Procedure Laterality Date  . DILATION AND CURETTAGE OF UTERUS     x2  . FOOT SURGERY Right   . HYSTEROSCOPY WITH D & C N/A 08/14/2016   Procedure: DILATATION AND CURETTAGE /HYSTEROSCOPY;  Surgeon: Olivia Mackie, MD;  Location: [facility];  Service: Gynecology;  Laterality: N/A;  . TONSILLECTOMY    . TOTAL HIP ARTHROPLASTY Left 02/03/2020   Procedure: LEFT TOTAL HIP ARTHROPLASTY ANTERIOR APPROACH;  Surgeon: Kathryne Hitch, MD;  Location: [facility];  Service: Orthopedics;  Laterality: Left;    There were no vitals filed for this visit.   Subjective Assessment - 12/25/20 1034    Subjective Brenda Ortiz arriving reporting 3/10 pain in right lateral hip.  Pt reporting compliance with her HEP    Limitations Standing;Walking    How long can you sit comfortably? Transfer from sit to stand is difficult    How long can you stand comfortably? < 15 minutes    How long can you walk comfortably? Walking is not comfortable    Patient Stated Goals Return to walking for exercise without pain     Currently in Pain? Yes    Pain Score 3     Pain Location Hip    Pain Orientation Right    Pain Descriptors / Indicators Tightness;Sore    Pain Type Chronic pain    Pain Onset More than a month ago                             [facility] Adult PT Treatment/Exercise - 12/25/20 0001      Knee/Hip Exercises: Stretches   ITB Stretch Right;2 reps;30 seconds    Piriformis Stretch 3 reps;30 seconds    Piriformis Stretch Limitations modified      Knee/Hip Exercises: Standing   Heel Raises Both;10 reps    Other Standing Knee Exercises 4 way hip exercises with UE support each x 10 on bilateral LE's.      Knee/Hip Exercises: Supine   Other Supine Knee/Hip Exercises hip isometrics: flexion/ extension/ adduction/ abduction all x 5 holding 5 seconds each      Knee/Hip Exercises: Sidelying   Hip ABduction Strengthening;Both;2 sets;5 reps    Hip ABduction Limitations cues for positioning      Manual Therapy   Manual therapy comments Hip mobs: inferior/superior glides, Long axis disraction  PT Long Term Goals - 12/25/20 1048      PT LONG TERM GOAL #1   Title Brenda Ortiz will score 75 on the FOTO functional scale.    Baseline 59    Status On-going      PT LONG TERM GOAL #2   Title Improve hip strength to 4+/5 MMT B for hip flexors and abductors.    Status On-going      PT LONG TERM GOAL #3   Title Brenda Ortiz will return to walking 1 mile or more with her home walking program.    Baseline Unable due to R hip pain.    Status On-going      PT LONG TERM GOAL #4   Title Brenda Ortiz will report R hip pain consistently 0-2/10 on the Numeric Pain Rating Scale.    Status On-going      PT LONG TERM GOAL #5   Title Brenda Ortiz will be independent with her long-term maintenence HEP at DC.    Status On-going                 Plan - 12/25/20 1043    Clinical Impression Statement Brenda Ortiz presenting today with 3/10 R hip pain. Pt reporting compliance  with HEP and pt instructed to continue her original HEP. Pt with good response to hip mobilizations and long axis distraction. Continue with standing balance activities to progress toward more functional mobility .    Personal Factors and Comorbidities Comorbidity 1    Comorbidities L THA, very weak    Examination-Activity Limitations Transfers;Bed Mobility;Squat;Lift;Locomotion Level;Stairs;Stand    Examination-Participation Restrictions Interpersonal Relationship;Yard Work;Community Activity    Stability/Clinical Decision Making Stable/Uncomplicated    Rehab Potential Good    PT Frequency 2x / week    PT Duration 8 weeks    PT Treatment/Interventions ADLs/Self Care Home Management;Iontophoresis 4mg /ml Dexamethasone;Therapeutic activities;Stair training;Gait training;Therapeutic exercise;Balance training;Neuromuscular re-education;Patient/family education;Dry needling    PT Next Visit Plan Progress leg strengthening and balance with special emphasis on hip abductors, update HEP, balance progression    PT Home Exercise Plan Access Code:    Consulted and Agree with Plan of Care Patient           Patient will benefit from skilled therapeutic intervention in order to improve the following deficits and impairments:  Abnormal gait,Decreased activity tolerance,Decreased balance,Decreased endurance,Decreased range of motion,Decreased strength,Difficulty walking,Pain  Visit Diagnosis: Unsteadiness on feet  Muscle weakness (generalized)  Difficulty walking  Pain in right hip  Stiffness of left hip, not elsewhere classified  Hip pain  Weakness of both hips     Problem List Patient Active Problem List   Diagnosis Date Noted  . Status post total replacement of left hip 02/03/2020  . Unilateral primary osteoarthritis, left hip 12/01/2019  . Low back pain 10/27/2019  . Left hip pain 10/27/2019  . Right hip pain 07/14/2017  . Chest pain 06/11/2015  . Hypothyroidism 06/11/2015     06/13/2015, PT, MPT 12/25/2020, 10:52 AM  [facility] Physical Therapy [address] [place], [place], [place] Phone: [phone]   Fax:  [phone]  Name: Brenda Ortiz MRN: [name] Date of Birth: [birth year]

## 2020-12-27 ENCOUNTER — Encounter: Payer: Medicare PPO | Admitting: Rehabilitative and Restorative Service Providers"

## 2021-01-01 ENCOUNTER — Other Ambulatory Visit: Payer: Self-pay

## 2021-01-01 ENCOUNTER — Encounter: Payer: Self-pay | Admitting: Rehabilitative and Restorative Service Providers"

## 2021-01-01 ENCOUNTER — Ambulatory Visit: Payer: Medicare PPO | Admitting: Rehabilitative and Restorative Service Providers"

## 2021-01-01 DIAGNOSIS — R262 Difficulty in walking, not elsewhere classified: Secondary | ICD-10-CM | POA: Diagnosis not present

## 2021-01-01 DIAGNOSIS — M25652 Stiffness of left hip, not elsewhere classified: Secondary | ICD-10-CM | POA: Diagnosis not present

## 2021-01-01 DIAGNOSIS — M6281 Muscle weakness (generalized): Secondary | ICD-10-CM | POA: Diagnosis not present

## 2021-01-01 DIAGNOSIS — R2681 Unsteadiness on feet: Secondary | ICD-10-CM | POA: Diagnosis not present

## 2021-01-01 DIAGNOSIS — M25552 Pain in left hip: Secondary | ICD-10-CM

## 2021-01-01 NOTE — Therapy (Signed)
[facility] Physical Therapy [address] [place], [place], [address] Phone: [phone]   Fax:  [phone]  Physical Therapy Treatment  Patient Details  Name: Brenda Ortiz MRN: [id number] Date of Birth: 11-27-1940 Referring Provider (PT): Virgia Land. Dub Mikes PA-C   Encounter Date: 01/01/2021   PT End of Session - 01/01/21 1104    Visit Number 6    Number of Visits 16    Authorization Type [employer]    Progress Note Due on Visit 10    PT Start Time 1020    PT Stop Time 1100    PT Time Calculation (min) 40 min    Equipment Utilized During Treatment Gait belt    Activity Tolerance No increased pain;Patient limited by fatigue    Behavior During Therapy [facility] for tasks assessed/performed           Past Medical History:  Diagnosis Date  . Arthritis   . Hypothyroidism   . Incontinence   . Seasonal allergies     Past Surgical History:  Procedure Laterality Date  . DILATION AND CURETTAGE OF UTERUS     x2  . FOOT SURGERY Right   . HYSTEROSCOPY WITH D & C N/A 08/14/2016   Procedure: DILATATION AND CURETTAGE /HYSTEROSCOPY;  Surgeon: Olivia Mackie, MD;  Location: [facility];  Service: Gynecology;  Laterality: N/A;  . TONSILLECTOMY    . TOTAL HIP ARTHROPLASTY Left 02/03/2020   Procedure: LEFT TOTAL HIP ARTHROPLASTY ANTERIOR APPROACH;  Surgeon: Kathryne Hitch, MD;  Location: [facility];  Service: Orthopedics;  Laterality: Left;    There were no vitals filed for this visit.   Subjective Assessment - 01/01/21 1101    Subjective Brenda Ortiz reports she has already walked today.  HEP compliance remains good.    Limitations Standing;Walking    How long can you sit comfortably? Transfer from sit to stand is difficult    How long can you stand comfortably? < 15 minutes    How long can you walk comfortably? Walking is not comfortable    Patient Stated Goals Return to walking for exercise without pain    Currently in Pain? Yes    Pain Score 3     Pain Location  Hip    Pain Orientation Right    Pain Descriptors / Indicators Sore;Tightness    Pain Type Chronic pain    Pain Radiating Towards NA    Pain Onset More than a month ago    Pain Frequency Intermittent    Aggravating Factors  Change of position and WB    Pain Relieving Factors Medication and a cortisone shpot    Effect of Pain on Daily Activities Limits walking for exercise, feelings of poor balance and instability and limited WB endurance    Multiple Pain Sites No                             [facility] Adult PT Treatment/Exercise - 01/01/21 0001      Therapeutic Activites    Therapeutic Activities ADL's    ADL's Obstacle course with narrow based gait, step-over 1 inch step and step-up and over 4 and 6 inch step NO hands      Neuro Re-ed    Neuro Re-ed Details  Heel to toe balance static and dynamic focus on narrow base and step-stretegy vs grabbing with hands      Exercises   Exercises Knee/Hip      Knee/Hip Exercises: Seated  Sit to Sand 5 reps;without UE support;Other (comment)   slow eccentrics                 PT Education - 01/01/21 1103    Education Details ReviewedHEP.  Suggested holding off on walking for exercise on the same day that she has PT as she was limited by fatigue today and she walked this morning.    Person(s) Educated Patient    Methods Explanation;Demonstration;Tactile cues;Verbal cues    Comprehension Verbal cues required;Returned demonstration;Need further instruction;Verbalized understanding;Tactile cues required               PT Long Term Goals - 01/01/21 1104      PT LONG TERM GOAL #1   Title Brenda Ortiz will score 75 on the FOTO functional scale.    Baseline 59    Status On-going      PT LONG TERM GOAL #2   Title Improve hip strength to 4+/5 MMT B for hip flexors and abductors.    Status On-going      PT LONG TERM GOAL #3   Title Brenda Ortiz will return to walking 1 mile or more with her home walking program.     Baseline Unable due to R hip pain.    Status On-going      PT LONG TERM GOAL #4   Title Brenda Ortiz will report R hip pain consistently 0-2/10 on the Numeric Pain Rating Scale.    Status On-going      PT LONG TERM GOAL #5   Title Brenda Ortiz will be independent with her long-term maintenence HEP at [place].    Status On-going                 Plan - 01/01/21 1105    Clinical Impression Statement Brenda Ortiz is fatigued today and she reports she has already walked for exercise with her husband this morning.  Gait is less wide-based than at evaluation although it is still wider than we would like.  Apprehension with static and dynamic balance is noted and step strategy needs work.  RA next visit to assess progress and make any necessary changes.    Personal Factors and Comorbidities Comorbidity 1    Comorbidities L THA, very weak    Examination-Activity Limitations Transfers;Bed Mobility;Squat;Lift;Locomotion Level;Stairs;Stand    Examination-Participation Restrictions Interpersonal Relationship;Yard Work;Community Activity    Stability/Clinical Decision Making Stable/Uncomplicated    Rehab Potential Good    PT Frequency 2x / week    PT Duration 8 weeks    PT Treatment/Interventions ADLs/Self Care Home Management;Iontophoresis 4mg /ml Dexamethasone;Therapeutic activities;Stair training;Gait training;Therapeutic exercise;Balance training;Neuromuscular re-education;Patient/family education;Dry needling    PT Next Visit Plan Progress leg strengthening and balance with special emphasis on hip abductors, update HEP, balance progression    PT Home Exercise Plan Access Code:    Consulted and Agree with Plan of Care Patient           Patient will benefit from skilled therapeutic intervention in order to improve the following deficits and impairments:  Abnormal gait,Decreased activity tolerance,Decreased balance,Decreased endurance,Decreased range of motion,Decreased strength,Difficulty  walking,Pain  Visit Diagnosis: Difficulty walking  Unsteadiness on feet  Muscle weakness (generalized)  Stiffness of left hip, not elsewhere classified  Left hip pain     Problem List Patient Active Problem List   Diagnosis Date Noted  . Status post total replacement of left hip 02/03/2020  . Unilateral primary osteoarthritis, left hip 12/01/2019  . Low back pain 10/27/2019  . Left hip pain 10/27/2019  .  Right hip pain 07/14/2017  . Chest pain 06/11/2015  . Hypothyroidism 06/11/2015    Cherlyn Cushing PT, MPT 01/01/2021, 11:08 AM  [facility] Physical Therapy [address] [place], [place], [address] Phone: [phone]   Fax:  [phone]  Name: QUANTASIA STEGNER MRN: [id number] Date of Birth: [birth year]

## 2021-01-03 ENCOUNTER — Encounter: Payer: Self-pay | Admitting: Orthopaedic Surgery

## 2021-01-03 ENCOUNTER — Encounter: Payer: Self-pay | Admitting: Rehabilitative and Restorative Service Providers"

## 2021-01-03 ENCOUNTER — Ambulatory Visit: Payer: Medicare PPO | Admitting: Rehabilitative and Restorative Service Providers"

## 2021-01-03 ENCOUNTER — Ambulatory Visit: Payer: Medicare PPO | Admitting: Orthopaedic Surgery

## 2021-01-03 ENCOUNTER — Other Ambulatory Visit: Payer: Self-pay

## 2021-01-03 DIAGNOSIS — M6281 Muscle weakness (generalized): Secondary | ICD-10-CM | POA: Diagnosis not present

## 2021-01-03 DIAGNOSIS — M7061 Trochanteric bursitis, right hip: Secondary | ICD-10-CM

## 2021-01-03 DIAGNOSIS — R2681 Unsteadiness on feet: Secondary | ICD-10-CM

## 2021-01-03 DIAGNOSIS — M25551 Pain in right hip: Secondary | ICD-10-CM

## 2021-01-03 DIAGNOSIS — R262 Difficulty in walking, not elsewhere classified: Secondary | ICD-10-CM

## 2021-01-03 NOTE — Progress Notes (Signed)
The patient is almost a year out from a total hip arthroplasty on the left side.  We actually x-rayed her hip in December of this past year and it looks good.  She has been dealing with right hip trochanteric bursitis.  We injected the right hip in December.  She is in physical therapy now and she is really needing physical therapy more for balance and coordination.  She has been very pleased with what physical therapy here has done for her and she would like to continue that which I think is reasonable considering she is still a fall risk.  She does have problems when she first gets up and I see her when she walks around.  She is still weak with both hips and lower extremities.  She knows that some of this may be her age but she would like to feel like she is stronger and more coordinated in more condition.  We will have her continue outpatient physical therapy for another 6 weeks.  I would like to see her back in 6 weeks for repeat exam and considering a right hip trochanteric injection again if needed.  We can order a standing AP pelvis at that visit as well.

## 2021-01-03 NOTE — Therapy (Signed)
[facility] Physical Therapy [address] [place], [place], [address] Phone: [phone]   Fax:  [phone]  Physical Therapy Treatment  Patient Details  Name: Brenda Ortiz MRN: [id number] Date of Birth: 06-06-1941 Referring Provider (PT): Virgia Land. Dub Mikes PA-C   Encounter Date: 01/03/2021   PT End of Session - 01/03/21 1631    Visit Number 7    Number of Visits 16    Authorization Type [employer]    Progress Note Due on Visit 10    PT Start Time 1350    PT Stop Time 1430    PT Time Calculation (min) 40 min    Equipment Utilized During Treatment Gait belt    Activity Tolerance No increased pain;Patient limited by fatigue    Behavior During Therapy [facility] for tasks assessed/performed           Past Medical History:  Diagnosis Date  . Arthritis   . Hypothyroidism   . Incontinence   . Seasonal allergies     Past Surgical History:  Procedure Laterality Date  . DILATION AND CURETTAGE OF UTERUS     x2  . FOOT SURGERY Right   . HYSTEROSCOPY WITH D & C N/A 08/14/2016   Procedure: DILATATION AND CURETTAGE /HYSTEROSCOPY;  Surgeon: Olivia Mackie, MD;  Location: [facility];  Service: Gynecology;  Laterality: N/A;  . TONSILLECTOMY    . TOTAL HIP ARTHROPLASTY Left 02/03/2020   Procedure: LEFT TOTAL HIP ARTHROPLASTY ANTERIOR APPROACH;  Surgeon: Kathryne Hitch, MD;  Location: [facility];  Service: Orthopedics;  Laterality: Left;    There were no vitals filed for this visit.   Subjective Assessment - 01/03/21 1627    Subjective [name] notes progress since starting PT although she is not where she would like to be physically.    Limitations Standing;Walking    How long can you sit comfortably? Transfer from sit to stand is difficult    How long can you stand comfortably? < 15 minutes    How long can you walk comfortably? Walking is not comfortable    Patient Stated Goals Return to walking for exercise without pain    Currently in Pain? Yes    Pain  Score 3     Pain Location Hip    Pain Orientation Right    Pain Descriptors / Indicators Sore;Tightness    Pain Type Chronic pain    Pain Onset More than a month ago    Pain Frequency Intermittent    Aggravating Factors  Change of position and too much WB    Pain Relieving Factors Medication and a cortisone shot    Effect of Pain on Daily Activities Feelings of balance and instability, limited WB endurance and limited walking for exercise    Multiple Pain Sites No                             [facility] Adult PT Treatment/Exercise - 01/03/21 0001      Therapeutic Activites    Therapeutic Activities ADL's    ADL's Dynamic gait drills emphasis on long strides, narrow stance and retro walking      Neuro Re-ed    Neuro Re-ed Details  Heel to toe balance static eyes open and eyes open head turning 4X each 20 seconds      Exercises   Exercises Knee/Hip      Knee/Hip Exercises: Standing   Other Standing Knee Exercises Alternating hip hike 2 sets of  10 for 3 seconds      Knee/Hip Exercises: Seated   Sit to Sand 5 reps;without UE support;Other (comment)   slow eccentrics     Knee/Hip Exercises: Prone   Straight Leg Raises Strengthening;Both;2 sets;10 reps;Limitations    Straight Leg Raises Limitations 3 seconds in ankle dorsiflexion                  PT Education - 01/03/21 1630    Education Details Reviewed and progressed HEP with more strength activities.  Worked on longer strides to increase efficiency and narrow gait.    Person(s) Educated Patient    Methods Explanation;Demonstration;Verbal cues;Handout    Comprehension Verbalized understanding;Returned demonstration;Need further instruction;Verbal cues required               PT Long Term Goals - 01/03/21 1630      PT LONG TERM GOAL #1   Title [name] will score 75 on the FOTO functional scale.    Baseline 59    Status On-going      PT LONG TERM GOAL #2   Title Improve hip strength to 4+/5 MMT  B for hip flexors and abductors.    Status On-going      PT LONG TERM GOAL #3   Title [name] will return to walking 1 mile or more with her home walking program.    Baseline Unable due to R hip pain.    Status On-going      PT LONG TERM GOAL #4   Title [name] will report R hip pain consistently 0-2/10 on the Numeric Pain Rating Scale.    Status On-going      PT LONG TERM GOAL #5   Title [name] will be independent with her long-term maintenence HEP at [place].    Status On-going                 Plan - 01/03/21 1424    Clinical Impression Statement [name] has improved her gait quality (narrower stance).  Hip and general leg strength, balance and gait quality will benefit from the recommended course of rehabilitation.  Increased focus on strengthening (vs balance) today.    Personal Factors and Comorbidities Comorbidity 1    Comorbidities L THA, very weak    Examination-Activity Limitations Transfers;Bed Mobility;Squat;Lift;Locomotion Level;Stairs;Stand    Examination-Participation Restrictions Interpersonal Relationship;Yard Work;Community Activity    Stability/Clinical Decision Making Stable/Uncomplicated    Rehab Potential Good    PT Frequency 2x / week    PT Duration 8 weeks    PT Treatment/Interventions ADLs/Self Care Home Management;Iontophoresis 4mg /ml Dexamethasone;Therapeutic activities;Stair training;Gait training;Therapeutic exercise;Balance training;Neuromuscular re-education;Patient/family education;Dry needling    PT Next Visit Plan Progress leg strengthening and balance with special emphasis on hip abductors, update HEP, balance progression    PT Home Exercise Plan Access Code:    Consulted and Agree with Plan of Care Patient           Patient will benefit from skilled therapeutic intervention in order to improve the following deficits and impairments:  Abnormal gait,Decreased activity tolerance,Decreased balance,Decreased endurance,Decreased range  of motion,Decreased strength,Difficulty walking,Pain  Visit Diagnosis: Difficulty walking  Unsteadiness on feet  Muscle weakness (generalized)  Pain in right hip     Problem List Patient Active Problem List   Diagnosis Date Noted  . Status post total replacement of left hip 02/03/2020  . Unilateral primary osteoarthritis, left hip 12/01/2019  . Low back pain 10/27/2019  . Left hip pain 10/27/2019  . Right hip pain  07/14/2017  . Chest pain 06/11/2015  . Hypothyroidism 06/11/2015    Cherlyn Cushing PT, MPT 01/03/2021, 4:35 PM  [facility] Physical Therapy [address] [place], [place], [address] Phone: [phone]   Fax:  [phone]  Name: NALIAH EDDINGTON MRN: [id number] Date of Birth: 08-03-1941

## 2021-01-03 NOTE — Patient Instructions (Signed)
Access Code: [email or web address] URL: [email or web address] Date: 01/03/2021 Prepared by: Pauletta Browns  Exercises Tandem Stance - 2 x daily - 7 x weekly - 1 sets - 5 reps - 20 second hold Supine Gluteus Stretch - 2 x daily - 7 x weekly - 1 sets - 5 reps - 20 seconds hold Supine Figure 4 Piriformis Stretch - 2 x daily - 7 x weekly - 1 sets - 5 reps - 20 seconds hold Prone Hip Extension - 2 x daily - 7 x weekly - 1 sets - 10 reps - 3 seconds hold Sit to Stand with Armchair - 2 x daily - 7 x weekly - 1 sets - 5 reps

## 2021-01-04 ENCOUNTER — Other Ambulatory Visit: Payer: Self-pay

## 2021-01-04 DIAGNOSIS — M7061 Trochanteric bursitis, right hip: Secondary | ICD-10-CM

## 2021-01-04 DIAGNOSIS — M25551 Pain in right hip: Secondary | ICD-10-CM

## 2021-01-16 ENCOUNTER — Ambulatory Visit: Payer: Medicare PPO | Admitting: Orthopaedic Surgery

## 2021-01-17 ENCOUNTER — Other Ambulatory Visit: Payer: Self-pay

## 2021-01-17 ENCOUNTER — Ambulatory Visit: Payer: Medicare PPO | Admitting: Rehabilitative and Restorative Service Providers"

## 2021-01-17 ENCOUNTER — Encounter: Payer: Self-pay | Admitting: Rehabilitative and Restorative Service Providers"

## 2021-01-17 DIAGNOSIS — M25651 Stiffness of right hip, not elsewhere classified: Secondary | ICD-10-CM

## 2021-01-17 DIAGNOSIS — R262 Difficulty in walking, not elsewhere classified: Secondary | ICD-10-CM

## 2021-01-17 DIAGNOSIS — M6281 Muscle weakness (generalized): Secondary | ICD-10-CM

## 2021-01-17 DIAGNOSIS — M25551 Pain in right hip: Secondary | ICD-10-CM

## 2021-01-17 DIAGNOSIS — R2681 Unsteadiness on feet: Secondary | ICD-10-CM | POA: Diagnosis not present

## 2021-01-17 DIAGNOSIS — M25652 Stiffness of left hip, not elsewhere classified: Secondary | ICD-10-CM

## 2021-01-17 NOTE — Therapy (Signed)
[facility] [address] [place], [place], [address] Phone: [phone]   Fax:  [phone]  Physical Therapy Treatment/Reassessment  Progress Note Reporting Period 12/12/2020  to 01/17/2021  See note below for Objective Data and Assessment of Progress/Goals.       Patient Details  Name: Brenda Ortiz Leaks MRN: [id number] Date of Birth: Oct 10, 1941 Referring Provider (PT): Virgia LandMary L. Dub MikesStanbery PA-C   Encounter Date: 01/17/2021   PT End of Session - 01/17/21 0957    Visit Number 8    Number of Visits 20    Date for PT Re-Evaluation 02/28/21    Authorization Type [employer]    Progress Note Due on Visit 16    PT Start Time [phone]    PT Stop Time 0940    PT Time Calculation (min) 54 min    Equipment Utilized During Treatment Gait belt    Activity Tolerance No increased pain;Patient limited by fatigue    Behavior [facility] for tasks assessed/performed           Past Medical History:  Diagnosis Date  . Arthritis   . Hypothyroidism   . Incontinence   . Seasonal allergies     Past Surgical History:  Procedure Laterality Date  . DILATION AND CURETTAGE OF UTERUS     x2  . FOOT SURGERY Right   . HYSTEROSCOPY WITH D & C N/A 08/14/2016   Procedure: DILATATION AND CURETTAGE /HYSTEROSCOPY;  Surgeon: Olivia Mackieichard Taavon, MD;  Location: [facility];  Service: Gynecology;  Laterality: N/A;  . TONSILLECTOMY    . TOTAL HIP ARTHROPLASTY Left 02/03/2020   Procedure: LEFT TOTAL HIP ARTHROPLASTY ANTERIOR APPROACH;  Surgeon: Kathryne HitchBlackman, Christopher Y, MD;  Location: [facility];  Service: Orthopedics;  Laterality: Left;    There were no vitals filed for this visit.   Subjective Assessment - 01/17/21 0950    Subjective L hip is painfree.  R hip bursitis/tendonitis is improving but still limits sleep.    Pertinent History L hip THA 01/2020    Limitations Standing;Walking    How long can you sit comfortably? Transfer from sit to stand is difficult    How long can  you stand comfortably? < 15 minutes    How long can you walk comfortably? Walking is not comfortable    Patient Stated Goals Return to walking for exercise without pain, sleep without interruption    Currently in Pain? Yes    Pain Score 3     Pain Location Hip    Pain Orientation Right    Pain Descriptors / Indicators Sore;Tender    Pain Type Chronic pain    Pain Radiating Towards NA    Pain Onset More than a month ago    Pain Frequency Intermittent    Aggravating Factors  Too much WB, prolonged sitting and sleeping    Pain Relieving Factors Better post-cortisone shot and after stretching or changing position    Effect of Pain on Daily Activities Sleep is interrupted by R hip pain.  Gait quality and endurance are affected.  36 on the Berg Balance Scale.    Multiple Pain Sites No              [facility] PT Assessment - 01/17/21 0001      ROM / Strength   AROM / PROM / Strength AROM;Strength      AROM   Overall AROM  Deficits    AROM Assessment Site Hip    Right/Left Hip Left;Right  Right Hip Flexion 110    Right Hip External Rotation  34    Right Hip Internal Rotation  10    Left Hip Flexion 100    Left Hip External Rotation  24    Left Hip Internal Rotation  8      Strength   Overall Strength Deficits    Strength Assessment Site Hip    Right/Left Hip Left;Right    Right Hip Flexion 3+/5    Right Hip ABduction 3+/5    Left Hip Flexion 3+/5    Left Hip ABduction 3+/5      Flexibility   Soft Tissue Assessment /Muscle Length yes    Hamstrings 40 degrees/45 degrees      Standardized Balance Assessment   Standardized Balance Assessment Berg Balance Test      Berg Balance Test   Sit to Stand Able to stand  independently using hands    Standing Unsupported Able to stand safely 2 minutes    Sitting with Back Unsupported but Feet Supported on Floor or Stool Able to sit safely and securely 2 minutes    Stand to Sit Controls descent by using hands    Transfers Able to  transfer safely, definite need of hands    Standing Unsupported with Eyes Closed Able to stand 10 seconds with supervision    Standing Unsupported with Feet Together Able to place feet together independently and stand 1 minute safely    From Standing, Reach Forward with Outstretched Arm Reaches forward but needs supervision    From Standing Position, Pick up Object from Floor Able to pick up shoe safely and easily    From Standing Position, Turn to Look Behind Over each Shoulder Turn sideways only but maintains balance    Turn 360 Degrees Able to turn 360 degrees safely one side only in 4 seconds or less    Standing Unsupported, Alternately Place Feet on Step/Stool Able to complete >2 steps/needs minimal assist    Standing Unsupported, One Foot in Front Needs help to step but can hold 15 seconds    Standing on One Leg Unable to try or needs assist to prevent fall    Total Score 36                         [facility] Adult PT Treatment/Exercise - 01/17/21 0001      Ambulation/Gait   Gait Comments Gait is narrower based than at evaluation.  Gait is still wider based than normal with occasional side steps to regain balance.      Therapeutic Activites    Therapeutic Activities --    ADL's --      Neuro Re-ed    Neuro Re-ed Details  Heel to toe balance static eyes open and eyes closed 4X each 20 seconds      Exercises   Exercises Knee/Hip      Knee/Hip Exercises: Stretches   Piriformis Stretch Both;4 reps;20 seconds;Other (comment)   Figure 4 with overpressure pushing into ER   Other Knee/Hip Stretches Gluteal stretch (knee to opposite shoulder) 4X 20 seconds      Knee/Hip Exercises: Standing   Heel Raises --   Next visit   Other Standing Knee Exercises Alternating hip hike 2 sets of 10 for 3 seconds      Knee/Hip Exercises: Seated   Sit to Sand 5 reps;without UE support;Other (comment)   slow eccentrics     Knee/Hip Exercises: Sidelying   Hip  ABduction --   Next visit 1/4  turn to stomach     Knee/Hip Exercises: Prone   Straight Leg Raises --   Next visit   Straight Leg Raises Limitations --                  PT Education - 01/17/21 0953    Education Details Reviewed the importance of daily HEP compliance to improve sleep, Ortiz hip strength, gait quality and balance.    Person(s) Educated Patient    Methods Explanation;Demonstration;Verbal cues;Handout    Comprehension Need further instruction;Verbal cues required;Returned demonstration;Verbalized understanding               PT Long Term Goals - 01/17/21 0956      PT LONG TERM GOAL #1   Title Kaylen will score 75 on the FOTO functional scale.    Baseline 59, 51 at RA 01/17/2021    Time 6    Period Weeks    Status On-going    Target Date 03/01/21      PT LONG TERM GOAL #2   Title Improve hip strength to 4+/5 MMT Ortiz for hip flexors and abductors.    Baseline 3+/5    Time 6    Period Weeks    Status On-going    Target Date 03/01/21      PT LONG TERM GOAL #3   Title Lakaisha will return to walking 1 mile or more with her home walking program.    Baseline Unable due to R hip pain.    Time 6    Period Weeks    Status On-going    Target Date 03/01/21      PT LONG TERM GOAL #4   Title Jaelin will report R hip pain consistently 0-2/10 on the Numeric Pain Rating Scale.    Baseline Wakes her up at night.    Time 6    Period Weeks    Status On-going    Target Date 03/01/21      PT LONG TERM GOAL #5   Title Jacquel will be independent with her long-term maintenence HEP at DC.    Time 6    Period Weeks    Status On-going    Target Date 03/01/21                 Plan - 01/17/21 0958    Clinical Impression Statement Serai has improved gait quality (narrower gait) since starting PT.  She is still apprehensive with steps and curbs and scored a 36 on the Berg Balance Scale suggesting she is full-time walker appropriate and a high falls risk.  Although improved, sleep is  still interrupted by R hip pain.  Improved HEP consistency should allow sleep to be uninterrupted.  Overall, Likisha is doing well with her L THA ~ 1 year post-surgery as her R hip seems to be most functionally limiting.  With continued supervised PT to address remaining flexibility, strength, balance and gait impairments, Roneka's prognosis to meet LTGs remains good.  Consistent HEP compliance will be key.    Personal Factors and Comorbidities Comorbidity 1    Comorbidities L THA, very weak R hip (Ortiz weakness)    Examination-Activity Limitations Transfers;Bed Mobility;Squat;Lift;Locomotion Level;Stairs;Stand    Examination-Participation Restrictions Interpersonal Relationship;Yard Work;Community Activity    Stability/Clinical Decision Making Stable/Uncomplicated    Clinical Decision Making Low    Rehab Potential Good    PT Frequency 2x / week    PT Duration 6 weeks  PT Treatment/Interventions ADLs/Self Care Home Management;Iontophoresis 4mg /ml Dexamethasone;Therapeutic activities;Stair training;Gait training;Therapeutic exercise;Balance training;Neuromuscular re-education;Patient/family education;Dry needling    PT Next Visit Plan Continue stretching to help sleeping and encouraged consistent HEP compliance, general leg strengthening (quadriceps and hip abductors), balance (36 Berg) and gait activities to reduce falls risk and improve gait endurance and quality.    PT Home Exercise Plan Access Code:    Consulted and Agree with Plan of Care Patient           Patient will benefit from skilled therapeutic intervention in order to improve the following deficits and impairments:  Abnormal gait,Decreased activity tolerance,Decreased balance,Decreased endurance,Decreased range of motion,Decreased strength,Difficulty walking,Pain,Impaired flexibility  Visit Diagnosis: Difficulty walking - Plan: PT plan of care cert/re-cert  Unsteadiness on feet - Plan: PT plan of care  cert/re-cert  Muscle weakness (generalized) - Plan: PT plan of care cert/re-cert  Pain in right hip - Plan: PT plan of care cert/re-cert  Stiffness of left hip, not elsewhere classified - Plan: PT plan of care cert/re-cert  Stiffness of right hip, not elsewhere classified - Plan: PT plan of care cert/re-cert     Problem List Patient Active Problem List   Diagnosis Date Noted  . Status post total replacement of left hip 02/03/2020  . Unilateral primary osteoarthritis, left hip 12/01/2019  . Low back pain 10/27/2019  . Left hip pain 10/27/2019  . Right hip pain 07/14/2017  . Chest pain 06/11/2015  . Hypothyroidism 06/11/2015   Referring diagnosis? M25.551 Treatment diagnosis? (if different than referring diagnosis) R26.2  R26.81  M25.551  M25.652  M25.651 What was this (referring dx) caused by? []  Surgery []  Fall [x]  Ongoing issue [x]  Arthritis []  Other: ____________  Laterality: []  Rt []  Lt [x]  Both  Check all possible CPT codes:      [x]  06/13/2015 (Therapeutic Exercise)  []  92507 (SLP Treatment)  [x]  97112 (Neuro Re-ed)   []  92526 (Swallowing Treatment)   [x]  97116 (Gait Training)   []  (Cognitive Training, 1st 15 minutes) [x]  97140 (Manual Therapy)   []  97130 (Cognitive Training, each add'l 15 minutes)  [x]  97530 (Therapeutic Activities)  []  Other, List CPT Code ____________    [x]  97535 (Self Care)       [x]  All codes above (97110 - 97535)  []  97012 (Mechanical Traction)  []  97014 (E-stim Unattended)  []  97032 (E-stim manual)  []  97033 (Ionto)  []  97035 (Ultrasound)  []  97760 (Orthotic Fit) [x]  (Physical Performance Training) []  (Aquatic Therapy) []  97034 (Contrast Bath) []  (Paraffin) []  97597 (Wound Care 1st 20 sq cm) []  97598 (Wound Care each add'l 20 sq cm) [x]  97016 (Vasopneumatic Device) []  (Orthotic Training) []  (Prosthetic Training)   PT, MPT 01/17/2021, 12:54 PM  [facility] Physical  Therapy [address] [place], , Phone: [phone]   Fax:  [phone]  Name: CHARLE MCLAURIN MRN: Date of Birth: 11-26-1940

## 2021-01-17 NOTE — Patient Instructions (Signed)
Access Code: [email or web address] URL: [email or web address] Date: 01/17/2021 Prepared by: Pauletta Browns  Exercises Tandem Stance - 2 x daily - 7 x weekly - 1 sets - 5 reps - 20 second hold Supine Gluteus Stretch - 1 x daily - 7 x weekly - 1 sets - 5 reps - 20 seconds hold Supine Figure 4 Piriformis Stretch - 1 x daily - 7 x weekly - 1 sets - 5 reps - 20 seconds hold Prone Hip Extension - 1 x daily - 7 x weekly - 2 sets - 10 reps - 3 seconds hold Sit to Stand with Armchair - 2 x daily - 7 x weekly - 2 sets - 5 reps

## 2021-01-18 ENCOUNTER — Ambulatory Visit: Payer: Medicare PPO | Admitting: Rehabilitative and Restorative Service Providers"

## 2021-01-18 ENCOUNTER — Encounter: Payer: Self-pay | Admitting: Rehabilitative and Restorative Service Providers"

## 2021-01-18 DIAGNOSIS — R262 Difficulty in walking, not elsewhere classified: Secondary | ICD-10-CM

## 2021-01-18 DIAGNOSIS — M25551 Pain in right hip: Secondary | ICD-10-CM | POA: Diagnosis not present

## 2021-01-18 DIAGNOSIS — R2681 Unsteadiness on feet: Secondary | ICD-10-CM | POA: Diagnosis not present

## 2021-01-18 DIAGNOSIS — M25652 Stiffness of left hip, not elsewhere classified: Secondary | ICD-10-CM

## 2021-01-18 DIAGNOSIS — M6281 Muscle weakness (generalized): Secondary | ICD-10-CM

## 2021-01-18 DIAGNOSIS — M25651 Stiffness of right hip, not elsewhere classified: Secondary | ICD-10-CM

## 2021-01-18 NOTE — Therapy (Signed)
[facility] Physical Therapy [address] [place], [place], [address] Phone: [phone]   Fax:  [phone]  Physical Therapy Treatment  Patient Details  Name: Brenda Ortiz MRN: [id number] Date of Birth: 1941/09/18 Referring Provider (PT): Virgia Land. Dub Mikes PA-C   Encounter Date: 01/18/2021   PT End of Session - 01/18/21 1039    Visit Number 9    Number of Visits 20    Date for PT Re-Evaluation 02/28/21    Authorization Type [employer]    Progress Note Due on Visit 16    PT Start Time 0932    PT Stop Time 1015    PT Time Calculation (min) 43 min    Equipment Utilized During Treatment Gait belt    Activity Tolerance No increased pain;Patient limited by fatigue    Behavior During Therapy [facility] for tasks assessed/performed           Past Medical History:  Diagnosis Date  . Arthritis   . Hypothyroidism   . Incontinence   . Seasonal allergies     Past Surgical History:  Procedure Laterality Date  . DILATION AND CURETTAGE OF UTERUS     x2  . FOOT SURGERY Right   . HYSTEROSCOPY WITH D & C N/A 08/14/2016   Procedure: DILATATION AND CURETTAGE /HYSTEROSCOPY;  Surgeon: Olivia Mackie, MD;  Location: [facility];  Service: Gynecology;  Laterality: N/A;  . TONSILLECTOMY    . TOTAL HIP ARTHROPLASTY Left 02/03/2020   Procedure: LEFT TOTAL HIP ARTHROPLASTY ANTERIOR APPROACH;  Surgeon: Kathryne Hitch, MD;  Location: [facility];  Service: Orthopedics;  Laterality: Left;    There were no vitals filed for this visit.   Subjective Assessment - 01/18/21 0955    Subjective L hip is painfree.  R hip bursitis/tendonitis is improving but still limits sleep.  No significant change from yesterday.    Pertinent History L hip THA 01/2020    Limitations Standing;Walking    How long can you sit comfortably? Transfer from sit to stand is difficult    How long can you stand comfortably? < 15 minutes    How long can you walk comfortably? Walking is not comfortable    Patient  Stated Goals Return to walking for exercise without pain, sleep without interruption    Currently in Pain? Yes    Pain Score 3     Pain Location Hip    Pain Orientation Right    Pain Descriptors / Indicators Sore;Tightness;Tender    Pain Type Chronic pain    Pain Onset More than a month ago    Pain Frequency Intermittent    Aggravating Factors  Too much WB, prolonged sitting and sleeping    Pain Relieving Factors Better post-cortisone shot and after stretching or changing position    Effect of Pain on Daily Activities Sleep is interrupted by R hip pain.  Gait quality and endurance are affected.  36 (walker appropriate, high falls risk) on Berg.    Multiple Pain Sites No                             [facility] Adult PT Treatment/Exercise - 01/18/21 0001      Neuro Re-ed    Neuro Re-ed Details  Berg drills: feet together and eyes closed, tandem balance, tap-ups, 360 turns and single leg marching      Exercises   Exercises Knee/Hip      Knee/Hip Exercises: Stretches   Piriformis Stretch  Both;4 reps;20 seconds;Other (comment)   Figure 4 with overpressure pushing into ER   Other Knee/Hip Stretches Gluteal stretch (knee to opposite shoulder) 4X 20 seconds      Knee/Hip Exercises: Standing   Heel Raises Both;2 sets;10 reps;Other (comment)   No hands heel to toe raises   Other Standing Knee Exercises Alternating hip hike 2 sets of 10 for 3 seconds      Knee/Hip Exercises: Seated   Sit to Sand 10 reps;without UE support;Other (comment)   From high table     Knee/Hip Exercises: Sidelying   Hip ABduction Strengthening;Both;2 sets;10 reps;Other (comment)   1/4 turn towards stomach from side     Knee/Hip Exercises: Prone   Straight Leg Raises Strengthening;Both;2 sets;10 reps;Other (comment)   3 seconds alternating leg lift ankle in DF                 PT Education - 01/18/21 1039    Education Details Reviewed updated HEP (prescribed yesterday).    Person(s)  Educated Patient    Methods Explanation;Demonstration;Verbal cues    Comprehension Returned demonstration;Need further instruction;Verbal cues required;Verbalized understanding               PT Long Term Goals - 01/18/21 1039      PT LONG TERM GOAL #1   Title Genee will score 75 on the FOTO functional scale.    Baseline 59, 51 at RA 01/17/2021    Time 6    Period Weeks    Status On-going      PT LONG TERM GOAL #2   Title Improve hip strength to 4+/5 MMT B for hip flexors and abductors.    Baseline 3+/5    Time 6    Period Weeks    Status On-going      PT LONG TERM GOAL #3   Title Telisha will return to walking 1 mile or more with her home walking program.    Baseline Unable due to R hip pain.    Time 6    Period Weeks    Status On-going      PT LONG TERM GOAL #4   Title Chrisoula will report R hip pain consistently 0-2/10 on the Numeric Pain Rating Scale.    Baseline Wakes her up at night.    Time 6    Period Weeks    Status On-going      PT LONG TERM GOAL #5   Title Gwendola will be independent with her long-term maintenence HEP at DC.    Time 6    Period Weeks    Status On-going                 Plan - 01/18/21 1040    Clinical Impression Statement No significant changes from yesterday.  Emphasis is on B hip strength, R hip flexibility (bursitis/tendonitis), balance and gait quality.  Increased emphasis on getting strengthening and flexibility exercises in at home with balance and gait activities a high priority in the office to meet LTGs.    Personal Factors and Comorbidities Comorbidity 1    Comorbidities L THA, very weak R hip (B weakness)    Examination-Activity Limitations Transfers;Bed Mobility;Squat;Lift;Locomotion Level;Stairs;Stand    Examination-Participation Restrictions Interpersonal Relationship;Yard Work;Community Activity    Stability/Clinical Decision Making Stable/Uncomplicated    Rehab Potential Good    PT Frequency 2x / week    PT  Duration 6 weeks    PT Treatment/Interventions ADLs/Self Care Home Management;Iontophoresis 4mg /ml Dexamethasone;Therapeutic activities;Stair training;Gait  training;Therapeutic exercise;Balance training;Neuromuscular re-education;Patient/family education;Dry needling    PT Next Visit Plan Continue stretching to help sleeping and encouraged consistent HEP compliance, general leg strengthening (quadriceps and hip abductors), balance (36 Berg) and gait activities to reduce falls risk and improve gait endurance and quality.    PT Home Exercise Plan Access Code: [email or web address]    Consulted and Agree with Plan of Care Patient           Patient will benefit from skilled therapeutic intervention in order to improve the following deficits and impairments:  Abnormal gait,Decreased activity tolerance,Decreased balance,Decreased endurance,Decreased range of motion,Decreased strength,Difficulty walking,Pain,Impaired flexibility  Visit Diagnosis: Difficulty walking  Unsteadiness on feet  Muscle weakness (generalized)  Pain in right hip  Stiffness of left hip, not elsewhere classified  Stiffness of right hip, not elsewhere classified     Problem List Patient Active Problem List   Diagnosis Date Noted  . Status post total replacement of left hip 02/03/2020  . Unilateral primary osteoarthritis, left hip 12/01/2019  . Low back pain 10/27/2019  . Left hip pain 10/27/2019  . Right hip pain 07/14/2017  . Chest pain 06/11/2015  . Hypothyroidism 06/11/2015    Cherlyn Cushing PT, MPT 01/18/2021, 10:42 AM  [facility] Physical Therapy [address] [place], [place], [address] Phone: [phone]   Fax:  [phone]  Name: SELENNE COGGIN MRN: [id number] Date of Birth: 09-29-1941

## 2021-01-21 ENCOUNTER — Ambulatory Visit: Payer: Medicare PPO | Admitting: Physical Therapy

## 2021-01-21 ENCOUNTER — Other Ambulatory Visit: Payer: Self-pay

## 2021-01-21 DIAGNOSIS — R2681 Unsteadiness on feet: Secondary | ICD-10-CM | POA: Diagnosis not present

## 2021-01-21 DIAGNOSIS — M25552 Pain in left hip: Secondary | ICD-10-CM

## 2021-01-21 DIAGNOSIS — M25551 Pain in right hip: Secondary | ICD-10-CM

## 2021-01-21 DIAGNOSIS — R29898 Other symptoms and signs involving the musculoskeletal system: Secondary | ICD-10-CM

## 2021-01-21 DIAGNOSIS — M25652 Stiffness of left hip, not elsewhere classified: Secondary | ICD-10-CM

## 2021-01-21 DIAGNOSIS — R262 Difficulty in walking, not elsewhere classified: Secondary | ICD-10-CM | POA: Diagnosis not present

## 2021-01-21 DIAGNOSIS — M25651 Stiffness of right hip, not elsewhere classified: Secondary | ICD-10-CM | POA: Diagnosis not present

## 2021-01-21 DIAGNOSIS — M25559 Pain in unspecified hip: Secondary | ICD-10-CM | POA: Diagnosis not present

## 2021-01-21 DIAGNOSIS — M6281 Muscle weakness (generalized): Secondary | ICD-10-CM

## 2021-01-21 NOTE — Therapy (Signed)
[facility] Physical Therapy [address] [place], [place], [address] Phone: [phone]   Fax:  [phone]  Physical Therapy Treatment  Patient Details  Name: Brenda Ortiz MRN: [id number] Date of Birth: February 25, 1941 Referring Provider (PT): Virgia Land. Dub Mikes PA-C   Encounter Date: 01/21/2021   PT End of Session - 01/21/21 0944    Visit Number 10    Number of Visits 20    Date for PT Re-Evaluation 02/28/21    Authorization Type [employer], Pt reporting she has been approved until May.    Progress Note Due on Visit 16    PT Start Time 0935    PT Stop Time 1015    PT Time Calculation (min) 40 min    Equipment Utilized During Treatment Gait belt    Activity Tolerance No increased pain;Patient limited by fatigue    Behavior During Therapy [facility] for tasks assessed/performed           Past Medical History:  Diagnosis Date  . Arthritis   . Hypothyroidism   . Incontinence   . Seasonal allergies     Past Surgical History:  Procedure Laterality Date  . DILATION AND CURETTAGE OF UTERUS     x2  . FOOT SURGERY Right   . HYSTEROSCOPY WITH D & C N/A 08/14/2016   Procedure: DILATATION AND CURETTAGE /HYSTEROSCOPY;  Surgeon: Olivia Mackie, MD;  Location: [facility];  Service: Gynecology;  Laterality: N/A;  . TONSILLECTOMY    . TOTAL HIP ARTHROPLASTY Left 02/03/2020   Procedure: LEFT TOTAL HIP ARTHROPLASTY ANTERIOR APPROACH;  Surgeon: Kathryne Hitch, MD;  Location: [facility];  Service: Orthopedics;  Laterality: Left;    There were no vitals filed for this visit.   Subjective Assessment - 01/21/21 0943    Subjective Pt arriving reporting R hip is worse 3/10 pain. Pt reporting no pain in L hip. Pt reporting she has been working over the weekend on sit to stand.    Pertinent History L hip THA 01/2020    Limitations Standing;Walking    How long can you sit comfortably? Transfer from sit to stand is difficult    How long can you stand comfortably? < 15 minutes     How long can you walk comfortably? Walking is not comfortable    Patient Stated Goals Return to walking for exercise without pain, sleep without interruption    Currently in Pain? Yes    Pain Score 3     Pain Location Hip    Pain Orientation Right    Pain Descriptors / Indicators Aching;Sore;Tightness    Pain Type Chronic pain    Pain Onset More than a month ago                             [facility] Adult PT Treatment/Exercise - 01/21/21 0001      Neuro Re-ed    Neuro Re-ed Details  Tandum balance full and 1/2 position, feet together/apart, standing on Airex, walking forward and back, side stepping      Exercises   Exercises Knee/Hip      Knee/Hip Exercises: Stretches   Piriformis Stretch Both;4 reps;20 seconds;Other (comment)   Figure 4 with overpressure pushing into ER   Other Knee/Hip Stretches Gluteal stretch (knee to opposite shoulder) 4X 20 seconds      Knee/Hip Exercises: Aerobic   Recumbent Bike L3 x 5 minutes      Knee/Hip Exercises: Standing   Heel Raises  Both;2 sets;10 reps;Other (comment)   No hands heel to toe raises     Knee/Hip Exercises: Seated   Sit to Sand 10 reps;Other (comment)   standard chiar, 1 UE support     Knee/Hip Exercises: Sidelying   Hip ABduction Strengthening;Both;2 sets;10 reps;Other (comment)   1/4 turn towards stomach from side   Other Sidelying Knee/Hip Exercises Reverse clams 2x10      Knee/Hip Exercises: Prone   Straight Leg Raises Strengthening;Both;2 sets;10 reps;Other (comment)   3 seconds alternating leg lift ankle in DF                      PT Long Term Goals - 01/21/21 0950      PT LONG TERM GOAL #1   Title Brenda Ortiz will score 75 on the FOTO functional scale.    Status On-going      PT LONG TERM GOAL #2   Title Improve hip strength to 4+/5 MMT B for hip flexors and abductors.    Status On-going      PT LONG TERM GOAL #3   Title Brenda Ortiz will return to walking 1 mile or more with her home  walking program.    Status On-going      PT LONG TERM GOAL #4   Title Brenda Ortiz will report R hip pain consistently 0-2/10 on the Numeric Pain Rating Scale.    Status On-going      PT LONG TERM GOAL #5   Title Brenda Ortiz will be independent with her long-term maintenence HEP at [place].    Status On-going                 Plan - 01/21/21 0946    Clinical Impression Statement Pt reporting 3/10 pain in R hip today and 0/10 pain in Left hip. Pt reporting improvements overall since beginning therapy. Pt progressing with dyanmic standing balance and LE strengthening and stretching. Pt still reporting some difficulty with sit to stand. Continue skilled PT progressing toward pt's LTG's. set.    Personal Factors and Comorbidities Comorbidity 1    Comorbidities L THA, very weak R hip (B weakness)    Examination-Activity Limitations Transfers;Bed Mobility;Squat;Lift;Locomotion Level;Stairs;Stand    Examination-Participation Restrictions Interpersonal Relationship;Yard Work;Community Activity    Stability/Clinical Decision Making Stable/Uncomplicated    Rehab Potential Good    PT Frequency 2x / week    PT Duration 6 weeks    PT Treatment/Interventions ADLs/Self Care Home Management;Iontophoresis 4mg /ml Dexamethasone;Therapeutic activities;Stair training;Gait training;Therapeutic exercise;Balance training;Neuromuscular re-education;Patient/family education;Dry needling    PT Next Visit Plan Continue stretching to help sleeping and encouraged consistent HEP compliance, general leg strengthening (quadriceps and hip abductors), balance (36 Berg) and gait activities to reduce falls risk and improve gait endurance and quality.    PT Home Exercise Plan Access Code:    Consulted and Agree with Plan of Care Patient           Patient will benefit from skilled therapeutic intervention in order to improve the following deficits and impairments:  Abnormal gait,Decreased activity tolerance,Decreased  balance,Decreased endurance,Decreased range of motion,Decreased strength,Difficulty walking,Pain,Impaired flexibility  Visit Diagnosis: Difficulty walking  Unsteadiness on feet  Muscle weakness (generalized)  Pain in right hip  Stiffness of left hip, not elsewhere classified  Stiffness of right hip, not elsewhere classified  Left hip pain  Hip pain  Weakness of both hips     Problem List Patient Active Problem List   Diagnosis Date Noted  . Status post total replacement  of left hip 02/03/2020  . Unilateral primary osteoarthritis, left hip 12/01/2019  . Low back pain 10/27/2019  . Left hip pain 10/27/2019  . Right hip pain 07/14/2017  . Chest pain 06/11/2015  . Hypothyroidism 06/11/2015    Sharmon Leyden, PT, MPT 01/21/2021, 10:10 AM  [facility] Physical Therapy [address] [place], [place], [address] Phone: [phone]   Fax:  [phone]  Name: Brenda Ortiz MRN: [id number] Date of Birth: Nov 19, 1940

## 2021-01-24 ENCOUNTER — Ambulatory Visit: Payer: Medicare PPO | Admitting: Physical Therapy

## 2021-01-24 ENCOUNTER — Other Ambulatory Visit: Payer: Self-pay

## 2021-01-24 ENCOUNTER — Encounter: Payer: Medicare PPO | Admitting: Rehabilitative and Restorative Service Providers"

## 2021-01-24 DIAGNOSIS — M25651 Stiffness of right hip, not elsewhere classified: Secondary | ICD-10-CM

## 2021-01-24 DIAGNOSIS — M25552 Pain in left hip: Secondary | ICD-10-CM

## 2021-01-24 DIAGNOSIS — M25551 Pain in right hip: Secondary | ICD-10-CM

## 2021-01-24 DIAGNOSIS — M25652 Stiffness of left hip, not elsewhere classified: Secondary | ICD-10-CM

## 2021-01-24 DIAGNOSIS — R262 Difficulty in walking, not elsewhere classified: Secondary | ICD-10-CM

## 2021-01-24 DIAGNOSIS — R2681 Unsteadiness on feet: Secondary | ICD-10-CM | POA: Diagnosis not present

## 2021-01-24 DIAGNOSIS — M6281 Muscle weakness (generalized): Secondary | ICD-10-CM | POA: Diagnosis not present

## 2021-01-24 NOTE — Therapy (Signed)
[facility] Physical Therapy [address] [place], [place], [address] Phone: [phone]   Fax:  [phone]  Physical Therapy Treatment  Patient Details  Name: Brenda Ortiz MRN: [id number] Date of Birth: 1941/01/08 Referring Provider (PT): Virgia Land. Dub Mikes PA-C   Encounter Date: 01/24/2021   PT End of Session - 01/24/21 1600    Visit Number 11    Number of Visits 20    Date for PT Re-Evaluation 02/28/21    Authorization Type [employer], Pt reporting she has been approved until May.    Authorization - Visit Number 4    Authorization - Number of Visits 12    Progress Note Due on Visit 16    PT Start Time 1515    PT Stop Time 1557    PT Time Calculation (min) 42 min    Equipment Utilized During Treatment Gait belt    Activity Tolerance No increased pain;Patient limited by fatigue    Behavior During Therapy [facility] for tasks assessed/performed           Past Medical History:  Diagnosis Date  . Arthritis   . Hypothyroidism   . Incontinence   . Seasonal allergies     Past Surgical History:  Procedure Laterality Date  . DILATION AND CURETTAGE OF UTERUS     x2  . FOOT SURGERY Right   . HYSTEROSCOPY WITH D & C N/A 08/14/2016   Procedure: DILATATION AND CURETTAGE /HYSTEROSCOPY;  Surgeon: Olivia Mackie, MD;  Location: [facility];  Service: Gynecology;  Laterality: N/A;  . TONSILLECTOMY    . TOTAL HIP ARTHROPLASTY Left 02/03/2020   Procedure: LEFT TOTAL HIP ARTHROPLASTY ANTERIOR APPROACH;  Surgeon: Kathryne Hitch, MD;  Location: [facility];  Service: Orthopedics;  Laterality: Left;    There were no vitals filed for this visit.   Subjective Assessment - 01/24/21 1532    Subjective Pt arriving stating that she had a lot of bilat hip pain first thing this morning but as the day has gone on it was resolved mostly.    Pertinent History L hip THA 01/2020    Limitations Standing;Walking    How long can you sit comfortably? Transfer from sit to stand is  difficult    How long can you stand comfortably? < 15 minutes    How long can you walk comfortably? Walking is not comfortable    Patient Stated Goals Return to walking for exercise without pain, sleep without interruption    Pain Onset More than a month ago            [facility] Adult PT Treatment/Exercise - 01/24/21 0001      Neuro Re-ed    Neuro Re-ed Details  lateral stepping to cones 3 directions  X5 reps ea, box walking 3 round trips      Knee/Hip Exercises: Stretches   Piriformis Stretch Both;3 reps;20 seconds      Knee/Hip Exercises: Aerobic   Recumbent Bike L2X 8 min      Knee/Hip Exercises: Standing   Other Standing Knee Exercises bilat hip marches, abd, ext X 20 ea with 3# with UE      Knee/Hip Exercises: Seated   Sit to Sand without UE support   10 reps, then 5 reps with airex pad in chair           PT Long Term Goals - 01/21/21 0950      PT LONG TERM GOAL #1   Title Jamieson will score 75 on the FOTO functional  scale.    Status On-going      PT LONG TERM GOAL #2   Title Improve hip strength to 4+/5 MMT B for hip flexors and abductors.    Status On-going      PT LONG TERM GOAL #3   Title Alynn will return to walking 1 mile or more with her home walking program.    Status On-going      PT LONG TERM GOAL #4   Title Kerrigan will report R hip pain consistently 0-2/10 on the Numeric Pain Rating Scale.    Status On-going      PT LONG TERM GOAL #5   Title Anet will be independent with her long-term maintenence HEP at [place].    Status On-going                 Plan - 01/24/21 1602    Clinical Impression Statement She had good overall tolerance to hip strengthening exercises and balance activiites. She is mostly min guard for balance but did require one episode of min to mod asssist to regain balance with sidestepping to cone activity. Continue POC.    Personal Factors and Comorbidities Comorbidity 1    Comorbidities L THA, very weak R hip (B  weakness)    Examination-Activity Limitations Transfers;Bed Mobility;Squat;Lift;Locomotion Level;Stairs;Stand    Examination-Participation Restrictions Interpersonal Relationship;Yard Work;Community Activity    Stability/Clinical Decision Making Stable/Uncomplicated    Rehab Potential Good    PT Frequency 2x / week    PT Duration 6 weeks    PT Treatment/Interventions ADLs/Self Care Home Management;Iontophoresis 4mg /ml Dexamethasone;Therapeutic activities;Stair training;Gait training;Therapeutic exercise;Balance training;Neuromuscular re-education;Patient/family education;Dry needling    PT Next Visit Plan Continue stretching to help sleeping and encouraged consistent HEP compliance, general leg strengthening (quadriceps and hip abductors), balance (36 Berg) and gait activities to reduce falls risk and improve gait endurance and quality.    PT Home Exercise Plan Access Code:    Consulted and Agree with Plan of Care Patient           Patient will benefit from skilled therapeutic intervention in order to improve the following deficits and impairments:  Abnormal gait,Decreased activity tolerance,Decreased balance,Decreased endurance,Decreased range of motion,Decreased strength,Difficulty walking,Pain,Impaired flexibility  Visit Diagnosis: Difficulty walking  Unsteadiness on feet  Muscle weakness (generalized)  Pain in right hip  Stiffness of left hip, not elsewhere classified  Stiffness of right hip, not elsewhere classified  Left hip pain     Problem List Patient Active Problem List   Diagnosis Date Noted  . Status post total replacement of left hip 02/03/2020  . Unilateral primary osteoarthritis, left hip 12/01/2019  . Low back pain 10/27/2019  . Left hip pain 10/27/2019  . Right hip pain 07/14/2017  . Chest pain 06/11/2015  . Hypothyroidism 06/11/2015    06/13/2015 01/24/2021, 4:03 PM  [facility] Physical Therapy [address] [place], [place], [place] Phone: [phone]   Fax:  [phone]  Name: Brenda Ortiz MRN: [name] Date of Birth: 1940-12-01

## 2021-01-29 ENCOUNTER — Encounter: Payer: Self-pay | Admitting: Physical Therapy

## 2021-01-29 ENCOUNTER — Other Ambulatory Visit: Payer: Self-pay

## 2021-01-29 ENCOUNTER — Ambulatory Visit (INDEPENDENT_AMBULATORY_CARE_PROVIDER_SITE_OTHER): Payer: Medicare PPO | Admitting: Physical Therapy

## 2021-01-29 DIAGNOSIS — R262 Difficulty in walking, not elsewhere classified: Secondary | ICD-10-CM

## 2021-01-29 DIAGNOSIS — M6281 Muscle weakness (generalized): Secondary | ICD-10-CM

## 2021-01-29 DIAGNOSIS — R2681 Unsteadiness on feet: Secondary | ICD-10-CM

## 2021-01-29 DIAGNOSIS — M25651 Stiffness of right hip, not elsewhere classified: Secondary | ICD-10-CM | POA: Diagnosis not present

## 2021-01-29 DIAGNOSIS — M25652 Stiffness of left hip, not elsewhere classified: Secondary | ICD-10-CM | POA: Diagnosis not present

## 2021-01-29 DIAGNOSIS — M25552 Pain in left hip: Secondary | ICD-10-CM

## 2021-01-29 DIAGNOSIS — R29898 Other symptoms and signs involving the musculoskeletal system: Secondary | ICD-10-CM

## 2021-01-29 DIAGNOSIS — M25551 Pain in right hip: Secondary | ICD-10-CM | POA: Diagnosis not present

## 2021-01-29 NOTE — Therapy (Signed)
[facility] Physical Therapy [address] [place], [place], [address] Phone: [phone]   Fax:  [phone]  Physical Therapy Treatment  Patient Details  Name: Brenda Ortiz MRN: [id number] Date of Birth: [birth year] Referring Provider (PT): Virgia Land. Dub Mikes PA-C   Encounter Date: 01/29/2021   PT End of Session - 01/29/21 0810    Visit Number 12    Number of Visits 20    Date for PT Re-Evaluation 02/28/21    Authorization Type [employer], Pt reporting she has been approved until May.    Authorization - Visit Number 5    Authorization - Number of Visits 12    Progress Note Due on Visit 16    PT Start Time 0800    PT Stop Time 0839    PT Time Calculation (min) 39 min    Equipment Utilized During Treatment Gait belt    Activity Tolerance No increased pain;Patient limited by fatigue    Behavior During Therapy [facility] for tasks assessed/performed           Past Medical History:  Diagnosis Date  . Arthritis   . Hypothyroidism   . Incontinence   . Seasonal allergies     Past Surgical History:  Procedure Laterality Date  . DILATION AND CURETTAGE OF UTERUS     x2  . FOOT SURGERY Right   . HYSTEROSCOPY WITH D & C N/A 08/14/2016   Procedure: DILATATION AND CURETTAGE /HYSTEROSCOPY;  Surgeon: Olivia Mackie, MD;  Location: [facility];  Service: Gynecology;  Laterality: N/A;  . TONSILLECTOMY    . TOTAL HIP ARTHROPLASTY Left 02/03/2020   Procedure: LEFT TOTAL HIP ARTHROPLASTY ANTERIOR APPROACH;  Surgeon: Kathryne Hitch, MD;  Location: [facility];  Service: Orthopedics;  Laterality: Left;    There were no vitals filed for this visit.   Subjective Assessment - 01/29/21 0807    Subjective Pt arriving stating pain of 3/10 pain in R hip. Pt reporting at night it hurts on both sides.    Pertinent History L hip THA 01/2020    Limitations Standing;Walking    How long can you sit comfortably? Transfer from sit to stand is difficult    How long can you stand  comfortably? < 15 minutes    How long can you walk comfortably? Walking is not comfortable    Patient Stated Goals Return to walking for exercise without pain, sleep without interruption    Currently in Pain? Yes    Pain Score 3     Pain Location Hip    Pain Orientation Right    Pain Descriptors / Indicators Aching;Sore    Pain Type Chronic pain    Pain Onset More than a month ago                             [facility] Adult PT Treatment/Exercise - 01/29/21 0001      Neuro Re-ed    Neuro Re-ed Details  vectors with slider disc x 10 each LE toward 3 cones, stepping over 5 cones x 4 reps, figure 8 walking around 5 cones x 4      Exercises   Exercises Knee/Hip      Knee/Hip Exercises: Stretches   Piriformis Stretch Both;3 reps;20 seconds    Piriformis Stretch Limitations seated      Knee/Hip Exercises: Aerobic   Recumbent Bike L3 x 8 minutes      Knee/Hip Exercises: Standing   Forward Step Up  Limitations heel taps on 8 inch step x 20 with single UE support    Other Standing Knee Exercises 4 way hip x 15 each      Knee/Hip Exercises: Seated   Sit to Sand 10 reps;Other (comment)   from arm chair using single UE support                      PT Long Term Goals - 01/29/21 0814      PT LONG TERM GOAL #1   Title Brenda Ortiz will score 75 on the FOTO functional scale.    Status On-going      PT LONG TERM GOAL #2   Title Improve hip strength to 4+/5 MMT B for hip flexors and abductors.      PT LONG TERM GOAL #3   Title Brenda Ortiz will return to walking 1 mile or more with her home walking program.    Status On-going      PT LONG TERM GOAL #4   Title Brenda Ortiz will report R hip pain consistently 0-2/10 on the Numeric Pain Rating Scale.    Status On-going      PT LONG TERM GOAL #5   Title Brenda Ortiz will be independent with her long-term maintenence HEP at [place].    Status On-going                 Plan - 01/29/21 0258    Clinical Impression  Statement Pt reporting compliance with her HEP. Pt reports stretching 3x/day and working on balance thoughout the day. Pt tolerating exercises well progressing with strengthening and balance exercises. Pt still requiring CGA for dynamic balance activities. Pt instructed to keep her toes behind her knees when standing to help with lift off from standard height arm chair. Pt still requiring single arm assist for sit to stand using standard arm chair. Progressing toward increased safety and funcitonal mobility.    Personal Factors and Comorbidities Comorbidity 1    Comorbidities L THA, very weak R hip (B weakness)    Examination-Activity Limitations Transfers;Bed Mobility;Squat;Lift;Locomotion Level;Stairs;Stand    Examination-Participation Restrictions Interpersonal Relationship;Yard Work;Community Activity    Stability/Clinical Decision Making Stable/Uncomplicated    Rehab Potential Good    PT Frequency 2x / week    PT Duration 6 weeks    PT Treatment/Interventions ADLs/Self Care Home Management;Iontophoresis 4mg /ml Dexamethasone;Therapeutic activities;Stair training;Gait training;Therapeutic exercise;Balance training;Neuromuscular re-education;Patient/family education;Dry needling    PT Next Visit Plan Continue stretching to help sleeping and encouraged consistent HEP compliance, general leg strengthening (quadriceps and hip abductors), balance (36 Berg) and gait activities to reduce falls risk and improve gait endurance and quality.    PT Home Exercise Plan Access Code:    Consulted and Agree with Plan of Care Patient           Patient will benefit from skilled therapeutic intervention in order to improve the following deficits and impairments:  Abnormal gait,Decreased activity tolerance,Decreased balance,Decreased endurance,Decreased range of motion,Decreased strength,Difficulty walking,Pain,Impaired flexibility  Visit Diagnosis: Difficulty walking  Unsteadiness on feet  Muscle  weakness (generalized)  Stiffness of left hip, not elsewhere classified  Pain in right hip  Stiffness of right hip, not elsewhere classified  Left hip pain  Weakness of both hips     Problem List Patient Active Problem List   Diagnosis Date Noted  . Status post total replacement of left hip 02/03/2020  . Unilateral primary osteoarthritis, left hip 12/01/2019  . Low back pain 10/27/2019  . Left hip  pain 10/27/2019  . Right hip pain 07/14/2017  . Chest pain 06/11/2015  . Hypothyroidism 06/11/2015    Sharmon Leyden, PT, MPT 01/29/2021, 8:42 AM  [facility] Physical Therapy [address] [place], [place], [address] Phone: [phone]   Fax:  [phone]  Name: BREI POCIASK MRN: [id number] Date of Birth: 08-May-1941

## 2021-01-31 ENCOUNTER — Other Ambulatory Visit: Payer: Self-pay

## 2021-01-31 ENCOUNTER — Encounter: Payer: Self-pay | Admitting: Rehabilitative and Restorative Service Providers"

## 2021-01-31 ENCOUNTER — Ambulatory Visit: Payer: Medicare PPO | Admitting: Rehabilitative and Restorative Service Providers"

## 2021-01-31 DIAGNOSIS — M6281 Muscle weakness (generalized): Secondary | ICD-10-CM

## 2021-01-31 DIAGNOSIS — R2681 Unsteadiness on feet: Secondary | ICD-10-CM

## 2021-01-31 DIAGNOSIS — M25651 Stiffness of right hip, not elsewhere classified: Secondary | ICD-10-CM | POA: Diagnosis not present

## 2021-01-31 DIAGNOSIS — M25652 Stiffness of left hip, not elsewhere classified: Secondary | ICD-10-CM

## 2021-01-31 DIAGNOSIS — M25551 Pain in right hip: Secondary | ICD-10-CM

## 2021-01-31 DIAGNOSIS — M25552 Pain in left hip: Secondary | ICD-10-CM

## 2021-01-31 DIAGNOSIS — R262 Difficulty in walking, not elsewhere classified: Secondary | ICD-10-CM | POA: Diagnosis not present

## 2021-01-31 NOTE — Therapy (Signed)
[facility] [address] [place], [place], [address] Phone: [phone]   Fax:  [phone]  Physical Therapy Treatment  Patient Details  Name: Brenda Ortiz MRN: [id number] Date of Birth: [birth year] Referring Provider (PT): Virgia Land. Dub Mikes PA-C   Encounter Date: 01/31/2021   PT End of Session - 01/31/21 1433    Visit Number 13    Number of Visits 20    Date for PT Re-Evaluation 02/28/21    Authorization Type [employer], Pt reporting she has been approved until May.    Authorization - Visit Number 13    Authorization - Number of Visits 20    Progress Note Due on Visit 20    PT Start Time 1348    PT Stop Time 1430    PT Time Calculation (min) 42 min    Equipment Utilized During Treatment Gait belt    Activity Tolerance No increased pain;Patient limited by fatigue;Patient tolerated treatment well    Behavior During Therapy [facility] for tasks assessed/performed           Past Medical History:  Diagnosis Date  . Arthritis   . Hypothyroidism   . Incontinence   . Seasonal allergies     Past Surgical History:  Procedure Laterality Date  . DILATION AND CURETTAGE OF UTERUS     x2  . FOOT SURGERY Right   . HYSTEROSCOPY WITH D & C N/A 08/14/2016   Procedure: DILATATION AND CURETTAGE /HYSTEROSCOPY;  Surgeon: Olivia Mackie, MD;  Location: [facility];  Service: Gynecology;  Laterality: N/A;  . TONSILLECTOMY    . TOTAL HIP ARTHROPLASTY Left 02/03/2020   Procedure: LEFT TOTAL HIP ARTHROPLASTY ANTERIOR APPROACH;  Surgeon: Kathryne Hitch, MD;  Location: [facility];  Service: Orthopedics;  Laterality: Left;    There were no vitals filed for this visit.   Subjective Assessment - 01/31/21 1430    Subjective Brenda Ortiz reports balance and feeling unstable are her biggest concerns.  R hip pain at night as well.    Pertinent History L hip THA 01/2020    Limitations Standing;Walking    How long can you sit comfortably? Transfer from sit to stand is  difficult    How long can you stand comfortably? < 15 minutes    How long can you walk comfortably? Walking is not comfortable    Patient Stated Goals Return to walking for exercise without pain, sleep without interruption    Currently in Pain? Yes    Pain Score 3     Pain Location Hip    Pain Orientation Right    Pain Descriptors / Indicators Aching;Sore    Pain Type Chronic pain    Pain Onset More than a month ago    Pain Frequency Intermittent    Aggravating Factors  Too much activity, prolonged postures, sleeping    Pain Relieving Factors After stretching or change of position    Effect of Pain on Daily Activities R hip pain limits sleep.  Gait quality and endurance are affected.  High falls risk with muted step strategy.    Multiple Pain Sites No                             [facility] Adult PT Treatment/Exercise - 01/31/21 0001      Neuro Re-ed    Neuro Re-ed Details  Berg drills: feet together and eyes closed, tandem balance, tap-ups, 360 turns and single leg marching  Exercises   Exercises Knee/Hip      Knee/Hip Exercises: Stretches   Piriformis Stretch --   Figure 4 with overpressure pushing into ER   Other Knee/Hip Stretches --      Knee/Hip Exercises: Standing   Heel Raises Both;2 sets;10 reps;Other (comment)   No hands heel to toe raises   Other Standing Knee Exercises Alternating hip hike 2 sets of 10 for 3 seconds      Knee/Hip Exercises: Seated   Sit to Sand 10 reps;without UE support;Other (comment)   From high table     Knee/Hip Exercises: Sidelying   Hip ABduction Strengthening;Both;2 sets;10 reps;Other (comment)   1/4 turn towards stomach from side     Knee/Hip Exercises: Prone   Straight Leg Raises Strengthening;Both;2 sets;10 reps;Other (comment)   3 seconds alternating leg lift ankle in DF                 PT Education - 01/31/21 1432    Education Details Reviewed HEP with instructions to do hip stretches 2X/day (ran out of  time in the office).    Person(s) Educated Patient    Methods Explanation;Demonstration;Tactile cues;Verbal cues    Comprehension Verbalized understanding;Tactile cues required;Need further instruction;Returned demonstration;Verbal cues required               PT Long Term Goals - 01/31/21 1432      PT LONG TERM GOAL #1   Title Brenda Ortiz will score 75 on the FOTO functional scale.    Status On-going      PT LONG TERM GOAL #2   Title Improve hip strength to 4+/5 MMT B for hip flexors and abductors.    Status On-going      PT LONG TERM GOAL #3   Title Brenda Ortiz will return to walking 1 mile or more with her home walking program.    Status On-going      PT LONG TERM GOAL #4   Title Brenda Ortiz will report R hip pain consistently 0-2/10 on the Numeric Pain Rating Scale.    Status On-going      PT LONG TERM GOAL #5   Title Brenda Ortiz will be independent with her long-term maintenence HEP at [place].    Status On-going                 Plan - 01/31/21 1434    Clinical Impression Statement Brenda Ortiz has several issues we are addressing.  Hip pain at night is being addressed with her home stretches.  She can find positions of comfort but can also be awakened by R hip pain.  Strength has improved, although she needs her hands for sit to stand from a standard chair.  Hip abductors and extensors weakness is also noted.  Finally, balance is an issue with a high falls risk on the Berg and Brenda Ortiz has several balance exercises to address this.  Continue current POC to improve comfort at night, gait quality and endurance and reduce Brenda Ortiz's risk of future falls.    Personal Factors and Comorbidities Comorbidity 1    Comorbidities L THA, very weak R hip (B weakness)    Examination-Activity Limitations Transfers;Bed Mobility;Squat;Lift;Locomotion Level;Stairs;Stand    Examination-Participation Restrictions Interpersonal Relationship;Yard Work;Community Activity    Stability/Clinical Decision  Making Stable/Uncomplicated    Rehab Potential Good    PT Frequency 2x / week    PT Duration 6 weeks    PT Treatment/Interventions ADLs/Self Care Home Management;Iontophoresis 4mg /ml Dexamethasone;Therapeutic activities;Stair training;Gait training;Therapeutic exercise;Balance training;Neuromuscular re-education;Patient/family education;Dry  needling    PT Next Visit Plan Continue stretching to help sleeping and encouraged consistent HEP compliance, general leg strengthening (quadriceps and hip abductors), balance (36 Berg) and gait activities to reduce falls risk and improve gait endurance and quality.    PT Home Exercise Plan Access Code: [email or web address]    Consulted and Agree with Plan of Care Patient           Patient will benefit from skilled therapeutic intervention in order to improve the following deficits and impairments:  Abnormal gait,Decreased activity tolerance,Decreased balance,Decreased endurance,Decreased range of motion,Decreased strength,Difficulty walking,Pain,Impaired flexibility  Visit Diagnosis: Difficulty walking  Unsteadiness on feet  Muscle weakness (generalized)  Stiffness of left hip, not elsewhere classified  Left hip pain  Stiffness of right hip, not elsewhere classified  Pain in right hip     Problem List Patient Active Problem List   Diagnosis Date Noted  . Status post total replacement of left hip 02/03/2020  . Unilateral primary osteoarthritis, left hip 12/01/2019  . Low back pain 10/27/2019  . Left hip pain 10/27/2019  . Right hip pain 07/14/2017  . Chest pain 06/11/2015  . Hypothyroidism 06/11/2015    Brenda Ortiz PT, MPT 01/31/2021, 2:38 PM  [facility] Therapy [address] [place], [place], [address] Phone: [phone]   Fax:  [phone]  Name: Brenda Ortiz MRN: [id number] Date of Birth: 1941/04/21

## 2021-02-05 ENCOUNTER — Ambulatory Visit: Payer: Medicare PPO | Admitting: Physical Therapy

## 2021-02-05 ENCOUNTER — Encounter: Payer: Self-pay | Admitting: Physical Therapy

## 2021-02-05 ENCOUNTER — Other Ambulatory Visit: Payer: Self-pay

## 2021-02-05 DIAGNOSIS — M25551 Pain in right hip: Secondary | ICD-10-CM | POA: Diagnosis not present

## 2021-02-05 DIAGNOSIS — R262 Difficulty in walking, not elsewhere classified: Secondary | ICD-10-CM | POA: Diagnosis not present

## 2021-02-05 DIAGNOSIS — R2681 Unsteadiness on feet: Secondary | ICD-10-CM | POA: Diagnosis not present

## 2021-02-05 DIAGNOSIS — R29898 Other symptoms and signs involving the musculoskeletal system: Secondary | ICD-10-CM

## 2021-02-05 DIAGNOSIS — M25552 Pain in left hip: Secondary | ICD-10-CM | POA: Diagnosis not present

## 2021-02-05 DIAGNOSIS — M25651 Stiffness of right hip, not elsewhere classified: Secondary | ICD-10-CM | POA: Diagnosis not present

## 2021-02-05 DIAGNOSIS — M25652 Stiffness of left hip, not elsewhere classified: Secondary | ICD-10-CM

## 2021-02-05 DIAGNOSIS — M6281 Muscle weakness (generalized): Secondary | ICD-10-CM | POA: Diagnosis not present

## 2021-02-05 NOTE — Therapy (Signed)
[facility] Therapy [address] [place], [place], [address] Phone: [phone]   Fax:  [phone]  Physical Therapy Treatment  Patient Details  Name: Brenda Ortiz MRN: [id number] Date of Birth: Dec 12, 1940 Referring Provider (PT): Virgia Land. Dub Mikes PA-C   Encounter Date: 02/05/2021   PT End of Session - 02/05/21 0829    Visit Number 14    Number of Visits 20    Date for PT Re-Evaluation 02/28/21    Authorization Type [employer], Pt reporting she has been approved until May.    Authorization - Visit Number 14    Authorization - Number of Visits 20    Progress Note Due on Visit 20    PT Start Time 0802    PT Stop Time 0840    PT Time Calculation (min) 38 min    Activity Tolerance No increased pain;Patient limited by fatigue;Patient tolerated treatment well    Behavior During Therapy [facility] for tasks assessed/performed           Past Medical History:  Diagnosis Date  . Arthritis   . Hypothyroidism   . Incontinence   . Seasonal allergies     Past Surgical History:  Procedure Laterality Date  . DILATION AND CURETTAGE OF UTERUS     x2  . FOOT SURGERY Right   . HYSTEROSCOPY WITH D & C N/A 08/14/2016   Procedure: DILATATION AND CURETTAGE /HYSTEROSCOPY;  Surgeon: Olivia Mackie, MD;  Location: [facility];  Service: Gynecology;  Laterality: N/A;  . TONSILLECTOMY    . TOTAL HIP ARTHROPLASTY Left 02/03/2020   Procedure: LEFT TOTAL HIP ARTHROPLASTY ANTERIOR APPROACH;  Surgeon: Kathryne Hitch, MD;  Location: [facility];  Service: Orthopedics;  Laterality: Left;    There were no vitals filed for this visit.   Subjective Assessment - 02/05/21 0825    Subjective Pt arriving today reporting pain of 3/10 and incresed pain when trying to sleep on her right side at night.    Pertinent History L hip THA 01/2020    Limitations Standing;Walking    How long can you sit comfortably? Transfer from sit to stand is difficult    How long can you walk  comfortably? Walking is not comfortable    Patient Stated Goals Return to walking for exercise without pain, sleep without interruption    Currently in Pain? Yes    Pain Score 3     Pain Location Hip    Pain Orientation Right    Pain Descriptors / Indicators Aching    Pain Type Chronic pain    Pain Onset More than a month ago                             [facility] Adult PT Treatment/Exercise - 02/05/21 0001      Exercises   Exercises Knee/Hip      Knee/Hip Exercises: Aerobic   Recumbent Bike L3 x 6 minutes      Knee/Hip Exercises: Standing   Heel Raises Both;2 sets;10 reps;Other (comment)   No hands heel to toe raises   Heel Raises Limitations on Airex mat with UE support    Forward Step Up Limitations heel taps on 6 inch step x 20    Other Standing Knee Exercises Airex: feet together, feet apart, staggard stance both directions    Other Standing Knee Exercises hip hiking on 4 inch step with UE support holding 3 second each      Knee/Hip  Exercises: Seated   Sit to Sand 10 reps;without UE support;Other (comment)   From high table                 PT Education - 02/05/21 0828    Education Details Instructed throughout session of exercise technique and core stability.    Person(s) Educated Patient    Methods Explanation    Comprehension Verbalized understanding               PT Long Term Goals - 02/05/21 0843      PT LONG TERM GOAL #1   Title Katy will score 75 on the FOTO functional scale.    Status On-going      PT LONG TERM GOAL #2   Status On-going      PT LONG TERM GOAL #3   Title Leandrea will return to walking 1 mile or more with her home walking program.    Status On-going      PT LONG TERM GOAL #4   Title Harpreet will report R hip pain consistently 0-2/10 on the Numeric Pain Rating Scale.    Status On-going      PT LONG TERM GOAL #5   Title Darren will be independent with her long-term maintenence HEP at [place].    Status  On-going                 Plan - 02/05/21 0830    Clinical Impression Statement Pt reporting 3/10 pain today upon arrival in her right hip. Pt reporting compliance in her HEP. Pt is tolerating exericses well still progressing with overall bilateral hip strengthening. Pt still requiring UE support for sit to stand and still presenting as fall risk. Today's treatment focusing on standing activities that incorporate balance and overall hip strengthening. Cotninue skilled PT.    Personal Factors and Comorbidities Comorbidity 1    Comorbidities L THA, very weak R hip (B weakness)    Examination-Activity Limitations Transfers;Bed Mobility;Squat;Lift;Locomotion Level;Stairs;Stand    Examination-Participation Restrictions Interpersonal Relationship;Yard Work;Community Activity    Stability/Clinical Decision Making Stable/Uncomplicated    Rehab Potential Good    PT Frequency 2x / week    PT Duration 6 weeks    PT Treatment/Interventions ADLs/Self Care Home Management;Iontophoresis 4mg /ml Dexamethasone;Therapeutic activities;Stair training;Gait training;Therapeutic exercise;Balance training;Neuromuscular re-education;Patient/family education;Dry needling    PT Next Visit Plan Continue stretching to help sleeping and encouraged consistent HEP compliance, general leg strengthening (quadriceps and hip abductors), balance (36 Berg) and gait activities to reduce falls risk and improve gait endurance and quality.    PT Home Exercise Plan Access Code:    Consulted and Agree with Plan of Care Patient           Patient will benefit from skilled therapeutic intervention in order to improve the following deficits and impairments:  Abnormal gait,Decreased activity tolerance,Decreased balance,Decreased endurance,Decreased range of motion,Decreased strength,Difficulty walking,Pain,Impaired flexibility  Visit Diagnosis: Difficulty walking  Unsteadiness on feet  Muscle weakness  (generalized)  Stiffness of left hip, not elsewhere classified  Left hip pain  Stiffness of right hip, not elsewhere classified  Pain in right hip  Weakness of both hips     Problem List Patient Active Problem List   Diagnosis Date Noted  . Status post total replacement of left hip 02/03/2020  . Unilateral primary osteoarthritis, left hip 12/01/2019  . Low back pain 10/27/2019  . Left hip pain 10/27/2019  . Right hip pain 07/14/2017  . Chest pain 06/11/2015  . Hypothyroidism 06/11/2015  Sharmon Leyden, PT, MPT 02/05/2021, 8:45 AM  [facility] Physical Therapy [address] [place], [place], [address] Phone: [phone]   Fax:  [phone]  Name: Brenda Ortiz MRN: [id number] Date of Birth: [birth year]

## 2021-02-07 ENCOUNTER — Other Ambulatory Visit: Payer: Self-pay

## 2021-02-07 ENCOUNTER — Encounter: Payer: Self-pay | Admitting: Rehabilitative and Restorative Service Providers"

## 2021-02-07 ENCOUNTER — Ambulatory Visit: Payer: Medicare PPO | Admitting: Rehabilitative and Restorative Service Providers"

## 2021-02-07 DIAGNOSIS — M25652 Stiffness of left hip, not elsewhere classified: Secondary | ICD-10-CM | POA: Diagnosis not present

## 2021-02-07 DIAGNOSIS — R2681 Unsteadiness on feet: Secondary | ICD-10-CM

## 2021-02-07 DIAGNOSIS — M25552 Pain in left hip: Secondary | ICD-10-CM

## 2021-02-07 DIAGNOSIS — R262 Difficulty in walking, not elsewhere classified: Secondary | ICD-10-CM | POA: Diagnosis not present

## 2021-02-07 DIAGNOSIS — M6281 Muscle weakness (generalized): Secondary | ICD-10-CM | POA: Diagnosis not present

## 2021-02-07 NOTE — Therapy (Signed)
[facility] Therapy [address] [place], [place], [address] Phone: [phone]   Fax:  [phone]  Physical Therapy Treatment  Patient Details  Name: Brenda Ortiz MRN: [id number] Date of Birth: 03-15-1941 Referring Provider (PT): Virgia Land. Dub Mikes PA-C   Encounter Date: 02/07/2021   PT End of Session - 02/07/21 1119    Visit Number 15    Number of Visits 20    Date for PT Re-Evaluation 02/28/21    Authorization Type [employer], Pt reporting she has been approved until May.    Authorization - Visit Number 15    Authorization - Number of Visits 20    Progress Note Due on Visit 20    PT Start Time 0930    PT Stop Time 1016    PT Time Calculation (min) 46 min    Equipment Utilized During Treatment Gait belt    Activity Tolerance No increased pain;Patient limited by fatigue;Patient tolerated treatment well    Behavior During Therapy [facility] for tasks assessed/performed           Past Medical History:  Diagnosis Date  . Arthritis   . Hypothyroidism   . Incontinence   . Seasonal allergies     Past Surgical History:  Procedure Laterality Date  . DILATION AND CURETTAGE OF UTERUS     x2  . FOOT SURGERY Right   . HYSTEROSCOPY WITH D & C N/A 08/14/2016   Procedure: DILATATION AND CURETTAGE /HYSTEROSCOPY;  Surgeon: Olivia Mackie, MD;  Location: [facility];  Service: Gynecology;  Laterality: N/A;  . TONSILLECTOMY    . TOTAL HIP ARTHROPLASTY Left 02/03/2020   Procedure: LEFT TOTAL HIP ARTHROPLASTY ANTERIOR APPROACH;  Surgeon: Kathryne Hitch, MD;  Location: [facility];  Service: Orthopedics;  Laterality: Left;    There were no vitals filed for this visit.   Subjective Assessment - 02/07/21 0954    Subjective Balance and sleeping on the R side are most functionally limiting.    Pertinent History L hip THA 01/2020    Limitations Standing;Walking    How long can you sit comfortably? Transfer from sit to stand is difficult    How long can you stand  comfortably? 15 minutes    How long can you walk comfortably? Walking is not comfortable    Patient Stated Goals Return to walking for exercise without pain, sleep without interruption    Currently in Pain? No/denies    Pain Onset More than a month ago    Aggravating Factors  Sitting for too long, sit to stand transfers, and sleeping on the R side    Pain Relieving Factors Stretching or change of position    Effect of Pain on Daily Activities R hip wakes her up at night.  Gait quality and balance are concerns.    Multiple Pain Sites No                             [facility] Adult PT Treatment/Exercise - 02/07/21 0001      Neuro Re-ed    Neuro Re-ed Details  Berg drills: feet together eyes closed; tandem balance; tap-ups, 360 degree turns, high stepping (single leg stance); heel to toe raises without hands      Exercises   Exercises Knee/Hip      Knee/Hip Exercises: Stretches   Piriformis Stretch Both;4 reps;20 seconds    Other Knee/Hip Stretches Gluteal stretch (knee to opposite shoulder) 4X 20 seconds  Knee/Hip Exercises: Seated   Sit to Sand 10 reps;without UE support;Other (comment)   From high table     Knee/Hip Exercises: Sidelying   Hip ABduction Strengthening;Both;2 sets;10 reps;Other (comment)   1/4 turn towards stomach from side     Knee/Hip Exercises: Prone   Straight Leg Raises Strengthening;Both;2 sets;10 reps;Other (comment)   3 seconds alternating leg lift ankle in DF                 PT Education - 02/07/21 1118    Education Details Reviewed HEP with emphasis on step strategy with loss of balance in doorway (or anywhere she can grab on both sideds).    Person(s) Educated Patient    Methods Explanation;Demonstration;Tactile cues;Verbal cues    Comprehension Verbal cues required;Returned demonstration;Need further instruction;Tactile cues required;Verbalized understanding               PT Long Term Goals - 02/07/21 1119      PT  LONG TERM GOAL #1   Title Brenda Ortiz will score 75 on the FOTO functional scale.    Status On-going      PT LONG TERM GOAL #2   Status On-going      PT LONG TERM GOAL #3   Title Brenda Ortiz will return to walking 1 mile or more with her home walking program.    Status On-going      PT LONG TERM GOAL #4   Title Brenda Ortiz will report R hip pain consistently 0-2/10 on the Numeric Pain Rating Scale.    Status On-going      PT LONG TERM GOAL #5   Title Brenda Ortiz will be independent with her long-term maintenence HEP at [place].    Status On-going                 Plan - 02/07/21 1120    Clinical Impression Statement Brenda Ortiz is most concerned with her balance, stability with sit to stand, R hip pain at night and falls risk.  All are being addressed with her current home and clinic program.  Subtle improvements are noted with her step-up, turning and step strategy today vs last week.  All will benefit from continued work to reduce her falls risk and improve her independence.    Personal Factors and Comorbidities Comorbidity 1    Comorbidities L THA, very weak R hip (B weakness)    Examination-Activity Limitations Transfers;Bed Mobility;Squat;Lift;Locomotion Level;Stairs;Stand    Examination-Participation Restrictions Interpersonal Relationship;Yard Work;Community Activity    Stability/Clinical Decision Making Stable/Uncomplicated    Rehab Potential Good    PT Frequency 2x / week    PT Duration 6 weeks    PT Treatment/Interventions ADLs/Self Care Home Management;Iontophoresis 4mg /ml Dexamethasone;Therapeutic activities;Stair training;Gait training;Therapeutic exercise;Balance training;Neuromuscular re-education;Patient/family education;Dry needling    PT Next Visit Plan Continue stretching to help sleeping and encouraged consistent HEP compliance, general leg strengthening (quadriceps and hip abductors), balance (36 Berg) and gait activities to reduce falls risk and improve gait endurance and  quality.    PT Home Exercise Plan Access Code:    Consulted and Agree with Plan of Care Patient           Patient will benefit from skilled therapeutic intervention in order to improve the following deficits and impairments:  Abnormal gait,Decreased activity tolerance,Decreased balance,Decreased endurance,Decreased range of motion,Decreased strength,Difficulty walking,Pain,Impaired flexibility  Visit Diagnosis: Difficulty walking  Unsteadiness on feet  Muscle weakness (generalized)  Stiffness of left hip, not elsewhere classified  Left hip pain  Problem List Patient Active Problem List   Diagnosis Date Noted  . Status post total replacement of left hip 02/03/2020  . Unilateral primary osteoarthritis, left hip 12/01/2019  . Low back pain 10/27/2019  . Left hip pain 10/27/2019  . Right hip pain 07/14/2017  . Chest pain 06/11/2015  . Hypothyroidism 06/11/2015    Cherlyn Cushing PT, MPT 02/07/2021, 11:23 AM  [facility] Physical Therapy [address] [place], [place], [address] Phone: [phone]   Fax:  [phone]  Name: Brenda Ortiz MRN: [id number] Date of Birth: [birth year]

## 2021-02-12 ENCOUNTER — Ambulatory Visit: Payer: Medicare PPO | Admitting: Physical Therapy

## 2021-02-12 ENCOUNTER — Encounter: Payer: Self-pay | Admitting: Physical Therapy

## 2021-02-12 ENCOUNTER — Other Ambulatory Visit: Payer: Self-pay

## 2021-02-12 DIAGNOSIS — R262 Difficulty in walking, not elsewhere classified: Secondary | ICD-10-CM | POA: Diagnosis not present

## 2021-02-12 DIAGNOSIS — M6281 Muscle weakness (generalized): Secondary | ICD-10-CM | POA: Diagnosis not present

## 2021-02-12 DIAGNOSIS — R2681 Unsteadiness on feet: Secondary | ICD-10-CM | POA: Diagnosis not present

## 2021-02-12 DIAGNOSIS — M25652 Stiffness of left hip, not elsewhere classified: Secondary | ICD-10-CM | POA: Diagnosis not present

## 2021-02-12 DIAGNOSIS — M25651 Stiffness of right hip, not elsewhere classified: Secondary | ICD-10-CM | POA: Diagnosis not present

## 2021-02-12 DIAGNOSIS — M25551 Pain in right hip: Secondary | ICD-10-CM

## 2021-02-12 DIAGNOSIS — M25552 Pain in left hip: Secondary | ICD-10-CM | POA: Diagnosis not present

## 2021-02-12 NOTE — Therapy (Signed)
[facility] [address] [place], [place], [address] Phone: [phone]   Fax:  [phone]  Physical Therapy Treatment  Patient Details  Name: Brenda Ortiz MRN: [id number] Date of Birth: 1941-09-15 Referring Provider (PT): Virgia Land. Dub Mikes PA-C   Encounter Date: 02/12/2021   PT End of Session - 02/12/21 1011    Visit Number 16    Number of Visits 20    Date for PT Re-Evaluation 02/28/21    Authorization Type [employer], Pt reporting she has been approved until May.    Authorization - Visit Number 16    Authorization - Number of Visits 20    Progress Note Due on Visit 20    PT Start Time 0850    PT Stop Time 0930    PT Time Calculation (min) 40 min    Equipment Utilized During Treatment Gait belt    Activity Tolerance No increased pain;Patient limited by fatigue;Patient tolerated treatment well    Behavior During Therapy [facility] for tasks assessed/performed           Past Medical History:  Diagnosis Date  . Arthritis   . Hypothyroidism   . Incontinence   . Seasonal allergies     Past Surgical History:  Procedure Laterality Date  . DILATION AND CURETTAGE OF UTERUS     x2  . FOOT SURGERY Right   . HYSTEROSCOPY WITH D & C N/A 08/14/2016   Procedure: DILATATION AND CURETTAGE /HYSTEROSCOPY;  Surgeon: Olivia Mackie, MD;  Location: [facility];  Service: Gynecology;  Laterality: N/A;  . TONSILLECTOMY    . TOTAL HIP ARTHROPLASTY Left 02/03/2020   Procedure: LEFT TOTAL HIP ARTHROPLASTY ANTERIOR APPROACH;  Surgeon: Kathryne Hitch, MD;  Location: [facility];  Service: Orthopedics;  Laterality: Left;    There were no vitals filed for this visit.   Subjective Assessment - 02/12/21 1009    Subjective Pt reporting soreness in her right hip and right upper thigh    Pertinent History L hip THA 01/2020    Limitations Standing;Walking    How long can you sit comfortably? Transfer from sit to stand is difficult    How long can you stand  comfortably? 15 minutes    How long can you walk comfortably? Walking is not comfortable    Patient Stated Goals Return to walking for exercise without pain, sleep without interruption    Currently in Pain? Yes    Pain Score 4     Pain Location Hip    Pain Orientation Right    Pain Descriptors / Indicators Sore    Pain Type Chronic pain    Pain Onset More than a month ago                             [facility] Adult PT Treatment/Exercise - 02/12/21 0001      Neuro Re-ed    Neuro Re-ed Details  Tandum stance with intermittent UE support, feet together, feet apart all with close supervision      Exercises   Exercises Knee/Hip      Knee/Hip Exercises: Stretches   Piriformis Stretch Both;4 reps;20 seconds    Other Knee/Hip Stretches Gluteal stretch (knee to opposite shoulder) 4X 20 seconds      Knee/Hip Exercises: Machines for Strengthening   Total Gym Leg Press 50# bilateral LE's 2x15      Knee/Hip Exercises: Standing   Other Standing Knee Exercises standing with single UE support  4 way hip exercises all x 20 reps      Knee/Hip Exercises: Seated   Sit to Sand 15 reps   starting with UE support and progressing to no UE support     Manual Therapy   Manual Therapy Soft tissue mobilization    Manual therapy comments percussion to right hip x 6 minutes                       PT Long Term Goals - 02/12/21 1018      PT LONG TERM GOAL #1   Title Audrielle will score 75 on the FOTO functional scale.    Status On-going      PT LONG TERM GOAL #2   Title Improve hip strength to 4+/5 MMT B for hip flexors and abductors.    Status On-going      PT LONG TERM GOAL #3   Title Kare will return to walking 1 mile or more with her home walking program.    Status On-going      PT LONG TERM GOAL #4   Title Tamya will report R hip pain consistently 0-2/10 on the Numeric Pain Rating Scale.    Status On-going      PT LONG TERM GOAL #5   Title Aylin will  be independent with her long-term maintenence HEP at [place].    Status On-going                 Plan - 02/12/21 1014    Clinical Impression Statement Pt with complaints of right hip/ thigh soreness today. Still concerned with inability to consistently get up and down from a chair without UE support. Pt was edu in foot placement and using momentum to stand from standard height chair with no UE's.Pt with good response to right hip percussion at end of session.  Continue to progress dyanmic balance and hip pain for the next 4 visits.    Personal Factors and Comorbidities Comorbidity 1    Comorbidities L THA, very weak R hip (B weakness)    Examination-Activity Limitations Transfers;Bed Mobility;Squat;Lift;Locomotion Level;Stairs;Stand    Examination-Participation Restrictions Interpersonal Relationship;Yard Work;Community Activity    Stability/Clinical Decision Making Stable/Uncomplicated    Rehab Potential Good    PT Frequency 2x / week    PT Duration 6 weeks    PT Treatment/Interventions ADLs/Self Care Home Management;Iontophoresis 4mg /ml Dexamethasone;Therapeutic activities;Stair training;Gait training;Therapeutic exercise;Balance training;Neuromuscular re-education;Patient/family education;Dry needling    PT Next Visit Plan Continue stretching to help sleeping and encouraged consistent HEP compliance, general leg strengthening (quadriceps and hip abductors), balance (36 Berg) and gait activities to reduce falls risk and improve gait endurance and quality. Percussion to right hip as needed    PT Home Exercise Plan Access Code:    Consulted and Agree with Plan of Care Patient           Patient will benefit from skilled therapeutic intervention in order to improve the following deficits and impairments:  Abnormal gait,Decreased activity tolerance,Decreased balance,Decreased endurance,Decreased range of motion,Decreased strength,Difficulty walking,Pain,Impaired flexibility  Visit  Diagnosis: Difficulty walking  Unsteadiness on feet  Muscle weakness (generalized)  Stiffness of left hip, not elsewhere classified  Left hip pain  Stiffness of right hip, not elsewhere classified  Pain in right hip     Problem List Patient Active Problem List   Diagnosis Date Noted  . Status post total replacement of left hip 02/03/2020  . Unilateral primary osteoarthritis, left hip 12/01/2019  .  Low back pain 10/27/2019  . Left hip pain 10/27/2019  . Right hip pain 07/14/2017  . Chest pain 06/11/2015  . Hypothyroidism 06/11/2015    Sharmon Leyden, PT, MPT 02/12/2021, 10:19 AM  [facility] [address] [place], [place], [address] Phone: [phone]   Fax:  [phone]  Name: LILYTH LAWYER MRN: [id number] Date of Birth: 1941/06/27

## 2021-02-14 ENCOUNTER — Other Ambulatory Visit: Payer: Self-pay

## 2021-02-14 ENCOUNTER — Ambulatory Visit: Payer: Medicare PPO | Admitting: Rehabilitative and Restorative Service Providers"

## 2021-02-14 ENCOUNTER — Ambulatory Visit (INDEPENDENT_AMBULATORY_CARE_PROVIDER_SITE_OTHER): Payer: Medicare PPO

## 2021-02-14 ENCOUNTER — Encounter: Payer: Self-pay | Admitting: Rehabilitative and Restorative Service Providers"

## 2021-02-14 ENCOUNTER — Ambulatory Visit: Payer: Medicare PPO | Admitting: Orthopaedic Surgery

## 2021-02-14 ENCOUNTER — Encounter: Payer: Self-pay | Admitting: Orthopaedic Surgery

## 2021-02-14 DIAGNOSIS — R262 Difficulty in walking, not elsewhere classified: Secondary | ICD-10-CM

## 2021-02-14 DIAGNOSIS — M6281 Muscle weakness (generalized): Secondary | ICD-10-CM | POA: Diagnosis not present

## 2021-02-14 DIAGNOSIS — M25551 Pain in right hip: Secondary | ICD-10-CM | POA: Diagnosis not present

## 2021-02-14 DIAGNOSIS — M7061 Trochanteric bursitis, right hip: Secondary | ICD-10-CM

## 2021-02-14 DIAGNOSIS — M25651 Stiffness of right hip, not elsewhere classified: Secondary | ICD-10-CM | POA: Diagnosis not present

## 2021-02-14 DIAGNOSIS — Z96642 Presence of left artificial hip joint: Secondary | ICD-10-CM

## 2021-02-14 NOTE — Progress Notes (Signed)
The patient is now just over a year out from a left total hip arthroplasty.  She is left hip is doing very well.  She is 80 years old and very active.  She is still in physical therapy and has another visit this afternoon.  She would like to continue physical therapy mainly focusing on the right hip.  She has dealt with chronic trochanteric bursitis and IT band syndrome on the right side.  She still denies any right groin pain.  She is pleased with her left total hip arthroplasty.  Her left hip moves smoothly and fluidly.  Her leg lengths are equal.  Her right hip also move smoothly and fluidly.  Her right hip has pain over the trochanteric area and the proximal IT band.  An AP pelvis and lateral the left hip shows a well-seated total hip arthroplasty with no complicating features.  There is no significant arthritic findings or acute findings with the right hip.  At this point we will have her continue her physical therapy for several more weeks.  This will be just to focus on the right hip trochanteric bursitis.  From my point follow-up can be as needed.  All questions and concerns were answered and addressed.

## 2021-02-14 NOTE — Therapy (Signed)
[facility] Physical Therapy [address] [place], [place], [address] Phone: [phone]   Fax:  [phone]  Physical Therapy Treatment/Recertification  Patient Details  Name: Brenda Ortiz MRN: [id number] Date of Birth: August 09, 1941 Referring Provider (PT): Virgia Land. Dub Mikes PA-C  Referring diagnosis? M25.551 Treatment diagnosis? (if different than referring diagnosis) R26.2  M62.81  R26.81 What was this (referring dx) caused by? []  Surgery []  Fall [x]  Ongoing issue []  Arthritis []  Other: ____________  Laterality: [x]  Rt []  Lt []  Both  Check all possible CPT codes:      [x]  97110 (Therapeutic Exercise)  []  (SLP Treatment)  [x]  97112 (Neuro Re-ed)   []  92526 (Swallowing Treatment)   [x]  97116 (Gait Training)   []  (Cognitive Training, 1st 15 minutes) [x]  97140 (Manual Therapy)   []  97130 (Cognitive Training, each add'l 15 minutes)  [x]  97530 (Therapeutic Activities)  []  Other, List CPT Code ____________    [x]  97535 (Self Care)       [x]  All codes above (97110 - 97535)  []  97012 (Mechanical Traction)  []  97014 (E-stim Unattended)  []  97032 (E-stim manual)  []  97033 (Ionto)  []  97035 (Ultrasound)  []  97760 (Orthotic Fit) [x]  (Physical Performance Training) []  (Aquatic Therapy) []  97034 (Contrast Bath) []  (Paraffin) []  97597 (Wound Care 1st 20 sq cm) []  97598 (Wound Care each add'l 20 sq cm) []  97016 (Vasopneumatic Device) []  [phone] (Orthotic Training) []  (Prosthetic Training)  Encounter Date: 02/14/2021   PT End of Session - 02/14/21 1656    Visit Number 17    Number of Visits 20    Date for PT Re-Evaluation 02/28/21    Authorization Type [employer], Pt reporting she has been approved until May.    Authorization - Visit Number 17    Authorization - Number of Visits 20    Progress Note Due on Visit 20    PT Start Time 1342    PT Stop Time 1430    PT Time Calculation (min) 48 min    Equipment Utilized  During Treatment Gait belt    Activity Tolerance No increased pain;Patient limited by fatigue;Patient tolerated treatment well    Behavior During Therapy [facility] for tasks assessed/performed           Past Medical History:  Diagnosis Date  . Arthritis   . Hypothyroidism   . Incontinence   . Seasonal allergies     Past Surgical History:  Procedure Laterality Date  . DILATION AND CURETTAGE OF UTERUS     x2  . FOOT SURGERY Right   . HYSTEROSCOPY WITH D & C N/A 08/14/2016   Procedure: DILATATION AND CURETTAGE /HYSTEROSCOPY;  Surgeon: , MD;  Location: [facility];  Service: Gynecology;  Laterality: N/A;  . TONSILLECTOMY    . TOTAL HIP ARTHROPLASTY Left 02/03/2020   Procedure: LEFT TOTAL HIP ARTHROPLASTY ANTERIOR APPROACH;  Surgeon: [id number], MD;  Location: [facility];  Service: Orthopedics;  Laterality: Left;    There were no vitals filed for this visit.   Subjective Assessment - 02/14/21 1650    Subjective [name] reports she had a good visit with Dr. .  She would like to continue supervised PT for her R hip and balance.    Pertinent History L hip THA 01/2020    Limitations Standing;Walking    How long can you sit comfortably? Transfer from sit to stand is difficult    How long can you stand comfortably? 15 minutes  How long can you walk comfortably? Walking is not comfortable    Patient Stated Goals Return to walking for exercise without pain, sleep without interruption    Currently in Pain? Yes    Pain Score 4     Pain Location Hip    Pain Orientation Right    Pain Descriptors / Indicators Sore;Aching    Pain Type Chronic pain    Pain Radiating Towards NA    Pain Onset More than a month ago    Pain Frequency Intermittent    Aggravating Factors  Pronged postures and sleeping on the R side    Pain Relieving Factors Change of position or exercises    Effect of Pain on Daily Activities R hip can wake her up at night.  Gait quality, endurance and  balance are concerns.    Multiple Pain Sites No              [facility] PT Assessment - 02/14/21 0001      ROM / Strength   AROM / PROM / Strength AROM;Strength      AROM   Overall AROM  Deficits    AROM Assessment Site Hip    Right/Left Hip Left;Right    Right Hip Flexion 105    Right Hip External Rotation  28    Right Hip Internal Rotation  15    Left Hip Flexion 105    Left Hip External Rotation  32    Left Hip Internal Rotation  8      Strength   Overall Strength Deficits    Strength Assessment Site Hip    Right Hip Flexion 3+/5    Right Hip ABduction 3+/5    Left Hip Flexion 3+/5    Left Hip ABduction 3+/5      Flexibility   Soft Tissue Assessment /Muscle Length yes    Hamstrings 45 degrees/45 degrees      Standardized Balance Assessment   Standardized Balance Assessment Berg Balance Test      Berg Balance Test   Sit to Stand Able to stand  independently using hands    Standing Unsupported Able to stand safely 2 minutes    Sitting with Back Unsupported but Feet Supported on Floor or Stool Able to sit safely and securely 2 minutes    Stand to Sit Sits safely with minimal use of hands    Transfers Able to transfer safely, definite need of hands    Standing Unsupported with Eyes Closed Able to stand 10 seconds safely    Standing Unsupported with Feet Together Able to place feet together independently and stand 1 minute safely    From Standing, Reach Forward with Outstretched Arm Can reach forward >5 cm safely (2")    From Standing Position, Pick up Object from Floor Able to pick up shoe safely and easily    From Standing Position, Turn to Look Behind Over each Shoulder Looks behind one side only/other side shows less weight shift    Turn 360 Degrees Able to turn 360 degrees safely in 4 seconds or less    Standing Unsupported, Alternately Place Feet on Step/Stool Able to complete >2 steps/needs minimal assist    Standing Unsupported, One Foot in Front Able to take  small step independently and hold 30 seconds    Standing on One Leg Tries to lift leg/unable to hold 3 seconds but remains standing independently    Total Score 43  [facility] Adult PT Treatment/Exercise - 02/14/21 0001      Neuro Re-ed    Neuro Re-ed Details  Berg Test 43/56      Exercises   Exercises Knee/Hip      Knee/Hip Exercises: Stretches   Piriformis Stretch Both;4 reps;20 seconds    Other Knee/Hip Stretches Gluteal stretch (knee to opposite shoulder) 4X 20 seconds      Knee/Hip Exercises: Seated   Sit to Sand 10 reps;without UE support      Knee/Hip Exercises: Sidelying   Hip ABduction --   1/4 turn towards stomach from side     Knee/Hip Exercises: Prone   Straight Leg Raises --   3 seconds alternating leg lift ankle in DF                 PT Education - 02/14/21 1653    Education Details Reviewed exam findings and HEP.  Continue current POC.    Person(s) Educated Patient    Methods Explanation;Demonstration;Verbal cues    Comprehension Verbal cues required;Returned demonstration;Need further instruction;Verbalized understanding               PT Long Term Goals - 02/14/21 1653      PT LONG TERM GOAL #1   Title 59    Baseline Was 52    Time 4    Period Weeks    Status On-going    Target Date 03/15/21      PT LONG TERM GOAL #2   Title Improve hip strength to 4+/5 MMT B for hip flexors and abductors.    Baseline 3+/5    Time 4    Period Weeks    Status New    Target Date 03/15/21      PT LONG TERM GOAL #3   Title Claris CheMargaret will return to walking 1 mile or more with her home walking program.    Baseline Unable due to R hip pain and endurance deficits.    Time 4    Period Weeks    Status On-going    Target Date 03/15/21      PT LONG TERM GOAL #4   Title Claris CheMargaret will report R hip pain consistently 0-2/10 on the Numeric Pain Rating Scale.    Baseline Wakes her up at night.    Time 4    Period Weeks     Status On-going    Target Date 03/15/21      PT LONG TERM GOAL #5   Title Claris CheMargaret will be independent with her long-term maintenence HEP at [place].    Time 4    Period Weeks    Status On-going    Target Date 03/15/21                 Plan - 02/14/21 1657    Clinical Impression Statement Claris CheMargaret improved her Sharlene MottsBerg Balance Score to 43 (was 36).  She is still a moderate risk for falls and is appropriate for a cane full-time (was walker appropriate).  Feedback was needed to stretches to correctly stretch into ER for hip bursitis symptoms as progress in this area will help Antanasia sleep uninterrupted.  Balance, R hip strength and flexibility remain priorities to help Claris CheMargaret reduce her falls risk, improve independence and decrease pain.    Personal Factors and Comorbidities Comorbidity 1    Comorbidities L THA, very weak R hip (B weakness)    Examination-Activity Limitations Transfers;Bed Mobility;Squat;Lift;Locomotion Level;Stairs;Stand    Examination-Participation Restrictions Interpersonal Relationship;Yard Work;Community Activity  Stability/Clinical Decision Making Stable/Uncomplicated    Clinical Decision Making Low    Rehab Potential Good    PT Frequency 2x / week    PT Duration 4 weeks    PT Treatment/Interventions ADLs/Self Care Home Management;Iontophoresis 4mg /ml Dexamethasone;Therapeutic activities;Stair training;Gait training;Therapeutic exercise;Balance training;Neuromuscular re-education;Patient/family education;Dry needling    PT Next Visit Plan Continue stretching to help sleeping and encouraged consistent HEP compliance, general leg strengthening (quadriceps and hip abductors), balance (43 Berg, was 36) and gait activities to reduce falls risk and improve gait endurance and quality. Percussion to right hip as needed.    PT Home Exercise Plan Access Code:    Consulted and Agree with Plan of Care Patient           Patient will benefit from skilled therapeutic  intervention in order to improve the following deficits and impairments:  Abnormal gait,Decreased activity tolerance,Decreased balance,Decreased endurance,Decreased range of motion,Decreased strength,Difficulty walking,Pain,Impaired flexibility  Visit Diagnosis: Difficulty walking  Muscle weakness (generalized)  Stiffness of right hip, not elsewhere classified  Pain in right hip     Problem List Patient Active Problem List   Diagnosis Date Noted  . Status post total replacement of left hip 02/03/2020  . Unilateral primary osteoarthritis, left hip 12/01/2019  . Low back pain 10/27/2019  . Left hip pain 10/27/2019  . Right hip pain 07/14/2017  . Chest pain 06/11/2015  . Hypothyroidism 06/11/2015    06/13/2015 PT, MPT 02/14/2021, 5:02 PM  [facility] Physical Therapy [address] [place], [place], [place] Phone: [phone]   Fax:  [phone]  Name: ENRICA CORLISS MRN: [name] Date of Birth: 1941/01/15

## 2021-02-18 ENCOUNTER — Other Ambulatory Visit: Payer: Self-pay

## 2021-02-18 ENCOUNTER — Encounter: Payer: Self-pay | Admitting: Rehabilitative and Restorative Service Providers"

## 2021-02-18 ENCOUNTER — Ambulatory Visit: Payer: Medicare PPO | Admitting: Rehabilitative and Restorative Service Providers"

## 2021-02-18 DIAGNOSIS — R2681 Unsteadiness on feet: Secondary | ICD-10-CM | POA: Diagnosis not present

## 2021-02-18 DIAGNOSIS — M6281 Muscle weakness (generalized): Secondary | ICD-10-CM

## 2021-02-18 DIAGNOSIS — M25551 Pain in right hip: Secondary | ICD-10-CM

## 2021-02-18 DIAGNOSIS — M25651 Stiffness of right hip, not elsewhere classified: Secondary | ICD-10-CM | POA: Diagnosis not present

## 2021-02-18 DIAGNOSIS — R262 Difficulty in walking, not elsewhere classified: Secondary | ICD-10-CM | POA: Diagnosis not present

## 2021-02-18 NOTE — Therapy (Signed)
[facility] Physical Therapy [address] [place], [place], [address] Phone: [phone]   Fax:  [phone]  Physical Therapy Treatment  Patient Details  Name: Brenda Ortiz MRN: [id number] Date of Birth: [birth year] Referring Provider (PT): Virgia Land. Dub Mikes PA-C   Encounter Date: 02/18/2021   PT End of Session - 02/18/21 1603    Visit Number 18    Number of Visits 20    Date for PT Re-Evaluation 02/28/21    Authorization Type [employer], Pt reporting she has been approved until May.    Authorization - Visit Number 18    Authorization - Number of Visits 20    Progress Note Due on Visit 20    PT Start Time 1301    PT Stop Time 1345    PT Time Calculation (min) 44 min    Equipment Utilized During Treatment Gait belt    Activity Tolerance No increased pain;Patient limited by fatigue;Patient tolerated treatment well    Behavior During Therapy [facility] for tasks assessed/performed           Past Medical History:  Diagnosis Date  . Arthritis   . Hypothyroidism   . Incontinence   . Seasonal allergies     Past Surgical History:  Procedure Laterality Date  . DILATION AND CURETTAGE OF UTERUS     x2  . FOOT SURGERY Right   . HYSTEROSCOPY WITH D & C Brenda Ortiz 08/14/2016   Procedure: DILATATION AND CURETTAGE /HYSTEROSCOPY;  Surgeon: Olivia Mackie, MD;  Location: [facility];  Service: Gynecology;  Laterality: Brenda Ortiz;  . TONSILLECTOMY    . TOTAL HIP ARTHROPLASTY Left 02/03/2020   Procedure: LEFT TOTAL HIP ARTHROPLASTY ANTERIOR APPROACH;  Surgeon: Kathryne Hitch, MD;  Location: [facility];  Service: Orthopedics;  Laterality: Left;    There were no vitals filed for this visit.   Subjective Assessment - 02/18/21 1558    Subjective Brenda Ortiz reports R hip pain at night.  She did need cues for correct hip stretching.    Pertinent History L hip THA 01/2020    Limitations Standing;Walking    How long can you sit comfortably? Transfer from sit to stand is difficult    How  long can you stand comfortably? 15 minutes    How long can you walk comfortably? Walking is not comfortable    Patient Stated Goals Return to walking for exercise without pain, sleep without interruption    Currently in Pain? Yes    Pain Score 5     Pain Location Hip    Pain Orientation Right    Pain Descriptors / Indicators Aching;Sore    Pain Type Chronic pain    Pain Radiating Towards NA    Pain Onset More than a month ago    Pain Frequency Intermittent    Aggravating Factors  Prolonged postures and sleeping on the R side.  R hip pain at night.    Pain Relieving Factors Change of position and correct exercises.    Effect of Pain on Daily Activities R hip pain affects sleep.  Gait quality, endurance, balance and falls risk are concerns.    Multiple Pain Sites No                             [facility] Adult PT Treatment/Exercise - 02/18/21 0001      Neuro Re-ed    Neuro Re-ed Details  Step up and back; Tap-ups; 360 turns; tandem balance  Exercises   Exercises Knee/Hip      Knee/Hip Exercises: Stretches   Piriformis Stretch Both;4 reps;20 seconds    Other Knee/Hip Stretches Gluteal stretch (knee to opposite shoulder) 4X 20 seconds      Knee/Hip Exercises: Seated   Sit to Sand 3 sets;5 reps;without UE support                  PT Education - 02/18/21 1601    Education Details Needed to correct stretch assigned to improve hip ER AROM (was stretching into IR AROM) to help pain at night.  Reviewed other hip stretches and worked on balance and gait.    Person(s) Educated Patient    Methods Explanation;Demonstration;Verbal cues;Tactile cues    Comprehension Verbalized understanding;Returned demonstration;Verbal cues required;Need further instruction;Tactile cues required               PT Long Term Goals - 02/18/21 1602      PT LONG TERM GOAL #1   Title 59    Baseline Was 52    Time 4    Period Weeks    Status On-going      PT LONG TERM GOAL  #2   Title Improve hip strength to 4+/5 MMT B for hip flexors and abductors.    Baseline 3+/5    Time 4    Period Weeks    Status On-going      PT LONG TERM GOAL #3   Title Brenda Ortiz will return to walking 1 mile or more with her home walking program.    Baseline Unable due to R hip pain and endurance deficits.    Time 4    Period Weeks    Status On-going      PT LONG TERM GOAL #4   Title Brenda Ortiz will report R hip pain consistently 0-2/10 on the Numeric Pain Rating Scale.    Baseline Wakes her up at night.    Time 4    Period Weeks    Status On-going      PT LONG TERM GOAL #5   Title Brenda Ortiz will be independent with her long-term maintenence HEP at DC.    Time 4    Period Weeks    Status On-going                 Plan - 02/18/21 1603    Clinical Impression Statement Hip stretching assigned to help with R hip pain at night needed to be corrected today.  Brenda Ortiz had been working on [employer] AROM (normal) not ER AROM (tight).  After correction, she stated she would make the change.  Steps looked better with practice, although she remains apprehensive and uses her hands more than recommended with delayed step strategy.  Brenda Ortiz is a higher falls risk than we would like and she will continue to benefit from skilled care.    Personal Factors and Comorbidities Comorbidity 1    Comorbidities L THA, very weak R hip (B weakness)    Examination-Activity Limitations Transfers;Bed Mobility;Squat;Lift;Locomotion Level;Stairs;Stand    Examination-Participation Restrictions Interpersonal Relationship;Yard Work;Community Activity    Stability/Clinical Decision Making Stable/Uncomplicated    Rehab Potential Good    PT Frequency 2x / week    PT Duration 4 weeks    PT Treatment/Interventions ADLs/Self Care Home Management;Iontophoresis 4mg /ml Dexamethasone;Therapeutic activities;Stair training;Gait training;Therapeutic exercise;Balance training;Neuromuscular re-education;Patient/family  education;Dry needling    PT Next Visit Plan Continue stretching to help sleeping and encouraged consistent HEP compliance, general leg strengthening (quadriceps and hip abductors),  balance (43 Berg, was 36) and gait activities to reduce falls risk and improve gait endurance and quality. Percussion to right hip as needed.    PT Home Exercise Plan Access Code: [email or web address]    Consulted and Agree with Plan of Care Patient           Patient will benefit from skilled therapeutic intervention in order to improve the following deficits and impairments:  Abnormal gait,Decreased activity tolerance,Decreased balance,Decreased endurance,Decreased range of motion,Decreased strength,Difficulty walking,Pain,Impaired flexibility  Visit Diagnosis: Difficulty walking  Muscle weakness (generalized)  Stiffness of right hip, not elsewhere classified  Pain in right hip  Unsteadiness on feet     Problem List Patient Active Problem List   Diagnosis Date Noted  . Status post total replacement of left hip 02/03/2020  . Unilateral primary osteoarthritis, left hip 12/01/2019  . Low back pain 10/27/2019  . Left hip pain 10/27/2019  . Right hip pain 07/14/2017  . Chest pain 06/11/2015  . Hypothyroidism 06/11/2015    Cherlyn Cushing PT, MPT 02/18/2021, 4:07 PM  [facility] Physical Therapy [address] [place], [place], [address] Phone: [phone]   Fax:  [phone]  Name: Brenda Ortiz MRN: [id number] Date of Birth: 1941/04/30

## 2021-02-21 ENCOUNTER — Encounter: Payer: Medicare PPO | Admitting: Rehabilitative and Restorative Service Providers"

## 2021-02-25 ENCOUNTER — Encounter: Payer: Self-pay | Admitting: Physical Therapy

## 2021-02-25 ENCOUNTER — Ambulatory Visit: Payer: Medicare PPO | Admitting: Physical Therapy

## 2021-02-25 ENCOUNTER — Other Ambulatory Visit: Payer: Self-pay

## 2021-02-25 DIAGNOSIS — R29898 Other symptoms and signs involving the musculoskeletal system: Secondary | ICD-10-CM | POA: Diagnosis not present

## 2021-02-25 DIAGNOSIS — M25552 Pain in left hip: Secondary | ICD-10-CM

## 2021-02-25 DIAGNOSIS — M6281 Muscle weakness (generalized): Secondary | ICD-10-CM

## 2021-02-25 DIAGNOSIS — M25551 Pain in right hip: Secondary | ICD-10-CM

## 2021-02-25 DIAGNOSIS — M25652 Stiffness of left hip, not elsewhere classified: Secondary | ICD-10-CM | POA: Diagnosis not present

## 2021-02-25 DIAGNOSIS — M25651 Stiffness of right hip, not elsewhere classified: Secondary | ICD-10-CM

## 2021-02-25 DIAGNOSIS — R262 Difficulty in walking, not elsewhere classified: Secondary | ICD-10-CM | POA: Diagnosis not present

## 2021-02-25 DIAGNOSIS — R2681 Unsteadiness on feet: Secondary | ICD-10-CM | POA: Diagnosis not present

## 2021-02-25 DIAGNOSIS — M25559 Pain in unspecified hip: Secondary | ICD-10-CM | POA: Diagnosis not present

## 2021-02-25 NOTE — Therapy (Signed)
[facility] Physical Therapy [address] [place], [place], [address] Phone: [phone]   Fax:  [phone]  Physical Therapy Treatment  Patient Details  Name: SARAJANE FAMBROUGH MRN: [id number] Date of Birth: [birth year] Referring Provider (PT): Virgia Land. Dub Mikes PA-C   Encounter Date: 02/25/2021   PT End of Session - 02/25/21 1447    Visit Number 19    Number of Visits 30    Date for PT Re-Evaluation 02/28/21    Authorization Type [employer], 12 additional visits approved from 02/15/2021 to 04/19/2021.    Authorization - Visit Number 19    Authorization - Number of Visits 30    Progress Note Due on Visit 20    PT Start Time 1430    PT Stop Time 1510    PT Time Calculation (min) 40 min    Equipment Utilized During Treatment Gait belt    Activity Tolerance No increased pain;Patient limited by fatigue;Patient tolerated treatment well    Behavior During Therapy [facility] for tasks assessed/performed           Past Medical History:  Diagnosis Date  . Arthritis   . Hypothyroidism   . Incontinence   . Seasonal allergies     Past Surgical History:  Procedure Laterality Date  . DILATION AND CURETTAGE OF UTERUS     x2  . FOOT SURGERY Right   . HYSTEROSCOPY WITH D & C N/A 08/14/2016   Procedure: DILATATION AND CURETTAGE /HYSTEROSCOPY;  Surgeon: Olivia Mackie, MD;  Location: [facility];  Service: Gynecology;  Laterality: N/A;  . TONSILLECTOMY    . TOTAL HIP ARTHROPLASTY Left 02/03/2020   Procedure: LEFT TOTAL HIP ARTHROPLASTY ANTERIOR APPROACH;  Surgeon: Kathryne Hitch, MD;  Location: [facility];  Service: Orthopedics;  Laterality: Left;    There were no vitals filed for this visit.   Subjective Assessment - 02/25/21 1444    Subjective Pt arriving today reporting 2/10 pain in her right hip.    Pertinent History L hip THA 01/2020    Limitations Standing;Walking    How long can you sit comfortably? Transfer from sit to stand is difficult    How long can you stand  comfortably? 15 minutes    How long can you walk comfortably? Walking is not comfortable    Patient Stated Goals Return to walking for exercise without pain, sleep without interruption    Pain Score 2     Pain Location Hip    Pain Orientation Right    Pain Descriptors / Indicators Aching;Sore    Pain Type Chronic pain    Pain Onset More than a month ago                             [facility] Adult PT Treatment/Exercise - 02/25/21 0001      Neuro Re-ed    Neuro Re-ed Details  standing balance: tandum stance with intermittent UE support and close supervision, step up on 4 inch x 10      Exercises   Exercises Knee/Hip      Knee/Hip Exercises: Stretches   Piriformis Stretch Both;4 reps;20 seconds    Other Knee/Hip Stretches Gluteal stretch (knee to opposite shoulder) 4X 20 seconds      Knee/Hip Exercises: Machines for Strengthening   Total Gym Leg Press bilateral LE's 56# 3x10      Knee/Hip Exercises: Seated   Sit to Sand 10 reps;without UE support;2 sets  PT Long Term Goals - 02/25/21 1459      PT LONG TERM GOAL #1   Title Lulia will score 75 on her FOTO.    Baseline 59    Period Weeks    Status On-going      PT LONG TERM GOAL #2   Title Improve hip strength to 4+/5 MMT B for hip flexors and abductors.    Status On-going      PT LONG TERM GOAL #3   Title Aileen will return to walking 1 mile or more with her home walking program.      PT LONG TERM GOAL #4   Title Kassie will report R hip pain consistently 0-2/10 on the Numeric Pain Rating Scale.    Status On-going      PT LONG TERM GOAL #5   Title Ahilyn will be independent with her long-term maintenence HEP at DC.    Status On-going                 Plan - 02/25/21 1455    Clinical Impression Statement Pt reporting 2/10 pain upon arrival today in his right hip. Pt reporting HEP compliance with her stretching. Pt is making progress with strengthening  of bilateral LE's. Pt will need a progress note at her next visit. Pt has been approved for 12 additional visits from 02/15/2021 to 04/19/2021. Continue skilled PT progress toward LTG's set.    Personal Factors and Comorbidities Comorbidity 1    Comorbidities L THA, very weak R hip (B weakness)    Examination-Activity Limitations Transfers;Bed Mobility;Squat;Lift;Locomotion Level;Stairs;Stand    Examination-Participation Restrictions Interpersonal Relationship;Yard Work;Community Activity    Stability/Clinical Decision Making Stable/Uncomplicated    Rehab Potential Good    PT Frequency 2x / week    PT Duration 6 weeks    PT Treatment/Interventions ADLs/Self Care Home Management;Iontophoresis 4mg /ml Dexamethasone;Therapeutic activities;Stair training;Gait training;Therapeutic exercise;Balance training;Neuromuscular re-education;Patient/family education;Dry needling    PT Next Visit Plan Continue stretching to help sleeping and encouraged consistent HEP compliance, general leg strengthening (quadriceps and hip abductors), balance (43 Berg, was 36) and gait activities to reduce falls risk and improve gait endurance and quality. Percussion to right hip as needed.    PT Home Exercise Plan Access Code:    Consulted and Agree with Plan of Care Patient           Patient will benefit from skilled therapeutic intervention in order to improve the following deficits and impairments:  Abnormal gait,Decreased activity tolerance,Decreased balance,Decreased endurance,Decreased range of motion,Decreased strength,Difficulty walking,Pain,Impaired flexibility  Visit Diagnosis: Difficulty walking  Muscle weakness (generalized)  Stiffness of right hip, not elsewhere classified  Pain in right hip  Unsteadiness on feet  Stiffness of left hip, not elsewhere classified  Left hip pain  Weakness of both hips  Hip pain     Problem List Patient Active Problem List   Diagnosis Date Noted  . Status  post total replacement of left hip 02/03/2020  . Unilateral primary osteoarthritis, left hip 12/01/2019  . Low back pain 10/27/2019  . Left hip pain 10/27/2019  . Right hip pain 07/14/2017  . Chest pain 06/11/2015  . Hypothyroidism 06/11/2015    06/13/2015, PT, MPT 02/25/2021, 3:13 PM  [facility] Physical Therapy [address] [place], [place], [place] Phone: [phone]   Fax:  [phone]  Name: MCKENIZE MEZERA MRN: [name] Date of Birth: 05-Dec-1940

## 2021-02-28 ENCOUNTER — Encounter: Payer: Medicare PPO | Admitting: Rehabilitative and Restorative Service Providers"

## 2021-03-06 ENCOUNTER — Other Ambulatory Visit: Payer: Self-pay

## 2021-03-06 ENCOUNTER — Ambulatory Visit: Payer: Medicare PPO | Admitting: Rehabilitative and Restorative Service Providers"

## 2021-03-06 ENCOUNTER — Encounter: Payer: Self-pay | Admitting: Rehabilitative and Restorative Service Providers"

## 2021-03-06 DIAGNOSIS — R2681 Unsteadiness on feet: Secondary | ICD-10-CM | POA: Diagnosis not present

## 2021-03-06 DIAGNOSIS — M25551 Pain in right hip: Secondary | ICD-10-CM | POA: Diagnosis not present

## 2021-03-06 DIAGNOSIS — M25651 Stiffness of right hip, not elsewhere classified: Secondary | ICD-10-CM | POA: Diagnosis not present

## 2021-03-06 DIAGNOSIS — R262 Difficulty in walking, not elsewhere classified: Secondary | ICD-10-CM

## 2021-03-06 DIAGNOSIS — M6281 Muscle weakness (generalized): Secondary | ICD-10-CM | POA: Diagnosis not present

## 2021-03-06 NOTE — Therapy (Signed)
[facility] Physical Therapy [address] [place], [place], [address] Phone: [phone]   Fax:  [phone]  Physical Therapy Treatment  Patient Details  Name: Brenda Ortiz MRN: [id number] Date of Birth: [birth year] Referring Provider (PT): Virgia Land. Dub Mikes PA-C   Encounter Date: 03/06/2021   PT End of Session - 03/06/21 1335    Visit Number 20    Number of Visits 30    Date for PT Re-Evaluation 02/28/21    Authorization Type [employer], 12 additional visits approved from 02/15/2021 to 04/19/2021.    Authorization - Visit Number 3    Authorization - Number of Visits 12    Progress Note Due on Visit 10    PT Start Time 1100    PT Stop Time 1140    PT Time Calculation (min) 40 min    Equipment Utilized During Treatment Gait belt    Activity Tolerance No increased pain;Patient limited by fatigue;Patient tolerated treatment well    Behavior During Therapy [facility] for tasks assessed/performed           Past Medical History:  Diagnosis Date  . Arthritis   . Hypothyroidism   . Incontinence   . Seasonal allergies     Past Surgical History:  Procedure Laterality Date  . DILATION AND CURETTAGE OF UTERUS     x2  . FOOT SURGERY Right   . HYSTEROSCOPY WITH D & C N/A 08/14/2016   Procedure: DILATATION AND CURETTAGE /HYSTEROSCOPY;  Surgeon: Olivia Mackie, MD;  Location: [facility];  Service: Gynecology;  Laterality: N/A;  . TONSILLECTOMY    . TOTAL HIP ARTHROPLASTY Left 02/03/2020   Procedure: LEFT TOTAL HIP ARTHROPLASTY ANTERIOR APPROACH;  Surgeon: Kathryne Hitch, MD;  Location: [facility];  Service: Orthopedics;  Laterality: Left;    There were no vitals filed for this visit.   Subjective Assessment - 03/06/21 1330    Subjective Brenda Ortiz just returned from a trip to [place] that involved a lot of sitting in a car.  She feels a bit stiff although gait looks better.    Pertinent History L hip THA 01/2020    Limitations Standing;Walking    How long can you sit  comfortably? Transfer from sit to stand is difficult    How long can you stand comfortably? 15 minutes    How long can you walk comfortably? Walking is not comfortable    Patient Stated Goals Return to walking for exercise without pain, sleep without interruption    Currently in Pain? Yes    Pain Score 2     Pain Location Hip    Pain Orientation Right    Pain Descriptors / Indicators Aching;Sore;Tightness    Pain Type Chronic pain    Pain Radiating Towards NA    Pain Onset More than a month ago    Pain Frequency Intermittent    Aggravating Factors  Prolonged postures (sitting) and sleeping on the R side    Pain Relieving Factors Change of position and exercises    Effect of Pain on Daily Activities R hip pain affects sleep.  Prolonged sitting requires breaks to move and stretch.  WB endurance is limited.  Gait appears a bit stiff.    Multiple Pain Sites No                             [facility] Adult PT Treatment/Exercise - 03/06/21 0001      Neuro Re-ed    Neuro  Re-ed Details  Step up and back; Tap-ups; 360 turns; tandem balance; long strides with gait      Exercises   Exercises Knee/Hip      Knee/Hip Exercises: Stretches   Piriformis Stretch Both;4 reps;20 seconds    Other Knee/Hip Stretches Gluteal stretch (knee to opposite shoulder) 4X 20 seconds      Knee/Hip Exercises: Seated   Sit to Sand 3 sets;5 reps;without UE support      Knee/Hip Exercises: Sidelying   Hip ABduction Strengthening;Both;2 sets;10 reps;Limitations    Hip ABduction Limitations 1/4 turn to stomach                  PT Education - 03/06/21 1333    Education Details Reviewed HEP.  Increased emphasis on hip abductors strength and taking long strides with gait.    Person(s) Educated Patient    Methods Explanation;Demonstration;Verbal cues    Comprehension Verbalized understanding;Returned demonstration;Verbal cues required;Need further instruction               PT Long Term  Goals - 03/06/21 1335      PT LONG TERM GOAL #1   Title Brenda Ortiz will score 75 on her FOTO.    Baseline 59    Period Weeks    Status On-going      PT LONG TERM GOAL #2   Title Improve hip strength to 4+/5 MMT B for hip flexors and abductors.    Status On-going      PT LONG TERM GOAL #3   Title Brenda Ortiz will return to walking 1 mile or more with her home walking program.    Status On-going      PT LONG TERM GOAL #4   Title Brenda Ortiz will report R hip pain consistently 0-2/10 on the Numeric Pain Rating Scale.    Status On-going      PT LONG TERM GOAL #5   Title Brenda Ortiz will be independent with her long-term maintenence HEP at DC.    Status On-going                 Plan - 03/06/21 1337    Clinical Impression Statement Brenda Ortiz reports feeling stiff after her long trip to [place] in a car.  Gait actually looks better but stiff.  We focused on taking longer strides to help continue to narrow her gait and make her more efficient.  Increased emphasis on hip abductors strengthening at HEP (stretching compliance good, strengthening compliance poor).  Continue balance, gait and hip strength work to improve sleep and meet LTGs.    Personal Factors and Comorbidities Comorbidity 1    Comorbidities L THA, very weak R hip (B weakness)    Examination-Activity Limitations Transfers;Bed Mobility;Squat;Lift;Locomotion Level;Stairs;Stand    Examination-Participation Restrictions Interpersonal Relationship;Yard Work;Community Activity    Stability/Clinical Decision Making Stable/Uncomplicated    Rehab Potential Good    PT Frequency 2x / week    PT Duration 6 weeks    PT Treatment/Interventions ADLs/Self Care Home Management;Iontophoresis 4mg /ml Dexamethasone;Therapeutic activities;Stair training;Gait training;Therapeutic exercise;Balance training;Neuromuscular re-education;Patient/family education;Dry needling    PT Next Visit Plan Continue stretching to help sleeping and encouraged consistent  HEP compliance, general leg strengthening (quadriceps and hip abductors), balance (43 Berg, was 36) and gait activities to reduce falls risk and improve gait endurance and quality. Percussion to right hip as needed.    PT Home Exercise Plan Access Code:    Consulted and Agree with Plan of Care Patient  Patient will benefit from skilled therapeutic intervention in order to improve the following deficits and impairments:  Abnormal gait,Decreased activity tolerance,Decreased balance,Decreased endurance,Decreased range of motion,Decreased strength,Difficulty walking,Pain,Impaired flexibility  Visit Diagnosis: Difficulty walking  Muscle weakness (generalized)  Stiffness of right hip, not elsewhere classified  Pain in right hip  Unsteadiness on feet     Problem List Patient Active Problem List   Diagnosis Date Noted  . Status post total replacement of left hip 02/03/2020  . Unilateral primary osteoarthritis, left hip 12/01/2019  . Low back pain 10/27/2019  . Left hip pain 10/27/2019  . Right hip pain 07/14/2017  . Chest pain 06/11/2015  . Hypothyroidism 06/11/2015    Cherlyn Cushing PT, MPT 03/06/2021, 1:40 PM  [facility] Physical Therapy [address] [place], [place], [address] Phone: [phone]   Fax:  [phone]  Name: Brenda Ortiz MRN: [id number] Date of Birth: [birth year]

## 2021-03-08 ENCOUNTER — Encounter: Payer: Self-pay | Admitting: Rehabilitative and Restorative Service Providers"

## 2021-03-08 ENCOUNTER — Ambulatory Visit: Payer: Medicare PPO | Admitting: Rehabilitative and Restorative Service Providers"

## 2021-03-08 ENCOUNTER — Other Ambulatory Visit: Payer: Self-pay

## 2021-03-08 DIAGNOSIS — M6281 Muscle weakness (generalized): Secondary | ICD-10-CM | POA: Diagnosis not present

## 2021-03-08 DIAGNOSIS — M25651 Stiffness of right hip, not elsewhere classified: Secondary | ICD-10-CM | POA: Diagnosis not present

## 2021-03-08 DIAGNOSIS — R262 Difficulty in walking, not elsewhere classified: Secondary | ICD-10-CM

## 2021-03-08 DIAGNOSIS — R2681 Unsteadiness on feet: Secondary | ICD-10-CM | POA: Diagnosis not present

## 2021-03-08 DIAGNOSIS — M25551 Pain in right hip: Secondary | ICD-10-CM | POA: Diagnosis not present

## 2021-03-08 NOTE — Therapy (Signed)
[facility] Physical Therapy [address] [place], [place], [address] Phone: [phone]   Fax:  [phone]  Physical Therapy Treatment  Patient Details  Name: Brenda Ortiz MRN: [id number] Date of Birth: 01/06/1941 Referring Provider (PT): Virgia Land. Dub Mikes PA-C   Encounter Date: 03/08/2021   PT End of Session - 03/08/21 1412    Visit Number 21    Number of Visits 30    Date for PT Re-Evaluation 02/28/21    Authorization Type [employer], 12 additional visits approved from 02/15/2021 to 04/19/2021.    Authorization - Visit Number 4    Authorization - Number of Visits 12    Progress Note Due on Visit 10    PT Start Time 1147    PT Stop Time 1231    PT Time Calculation (min) 44 min    Equipment Utilized During Treatment Gait belt    Activity Tolerance No increased pain;Patient limited by fatigue;Patient tolerated treatment well    Behavior During Therapy [facility] for tasks assessed/performed           Past Medical History:  Diagnosis Date  . Arthritis   . Hypothyroidism   . Incontinence   . Seasonal allergies     Past Surgical History:  Procedure Laterality Date  . DILATION AND CURETTAGE OF UTERUS     x2  . FOOT SURGERY Right   . HYSTEROSCOPY WITH D & C N/A 08/14/2016   Procedure: DILATATION AND CURETTAGE /HYSTEROSCOPY;  Surgeon: Olivia Mackie, MD;  Location: [facility];  Service: Gynecology;  Laterality: N/A;  . TONSILLECTOMY    . TOTAL HIP ARTHROPLASTY Left 02/03/2020   Procedure: LEFT TOTAL HIP ARTHROPLASTY ANTERIOR APPROACH;  Surgeon: Kathryne Hitch, MD;  Location: [facility];  Service: Orthopedics;  Laterality: Left;    There were no vitals filed for this visit.   Subjective Assessment - 03/08/21 1212    Subjective Jarica notes increased soreness after standing at an event last night for 2 hours.    Pertinent History L hip THA 01/2020    Limitations Standing;Walking    How long can you sit comfortably? Transfer from sit to stand is difficult     How long can you stand comfortably? 15 minutes    How long can you walk comfortably? Walking is not comfortable    Patient Stated Goals Return to walking for exercise without pain, sleep without interruption    Currently in Pain? Yes    Pain Score 3     Pain Location Hip    Pain Orientation Right    Pain Descriptors / Indicators Aching;Sore;Tightness    Pain Type Chronic pain    Pain Radiating Towards NA    Pain Onset More than a month ago    Pain Frequency Intermittent    Aggravating Factors  Prolonged standing, prolonged sitting, walking > a mile    Pain Relieving Factors Change of position and exercises    Effect of Pain on Daily Activities R hip pain affects sleep.  Poor endurance with prolonged postures.    Multiple Pain Sites No                             [facility] Adult PT Treatment/Exercise - 03/08/21 0001      Neuro Re-ed    Neuro Re-ed Details  Step up; HT balance; Tap-ups; 360 turns; tandem balance; long strides with gait      Exercises   Exercises Knee/Hip  Knee/Hip Exercises: Stretches   Piriformis Stretch Both;4 reps;20 seconds    Other Knee/Hip Stretches Gluteal stretch (knee to opposite shoulder) 4X 20 seconds      Knee/Hip Exercises: Seated   Sit to Sand 3 sets;5 reps;without UE support      Knee/Hip Exercises: Sidelying   Hip ABduction Strengthening;Both;2 sets;10 reps;Limitations    Hip ABduction Limitations 1/4 turn to stomach                  PT Education - 03/08/21 1220    Education Details Reviewed HEP.  Discussed importance of hip abductors and sit to stand exercises with HEP.    Person(s) Educated Patient    Methods Explanation;Demonstration;Tactile cues;Verbal cues    Comprehension Verbal cues required;Need further instruction;Returned demonstration;Verbalized understanding;Tactile cues required               PT Long Term Goals - 03/08/21 1411      PT LONG TERM GOAL #1   Title Megha will score 75 on  her FOTO.    Baseline 59    Period Weeks    Status On-going      PT LONG TERM GOAL #2   Title Improve hip strength to 4+/5 MMT B for hip flexors and abductors.    Status On-going      PT LONG TERM GOAL #3   Title Selisa will return to walking 1 mile or more with her home walking program.    Status On-going      PT LONG TERM GOAL #4   Title Bonne will report R hip pain consistently 0-2/10 on the Numeric Pain Rating Scale.    Status On-going      PT LONG TERM GOAL #5   Title Sherrye will be independent with her long-term maintenence HEP at DC.    Status On-going                 Plan - 03/08/21 1412    Clinical Impression Statement Zailey was honored at an event at [facility] that required her to stand for 2 hours.  She was able to do it, but was sore after and today.  Reinforced the importance of strengthening activities at home.  Steps are improving although she fatigues quickly and lacks confidence (fear of falling).  Continue POC to address AROM, flexibility, strength, balance and functional impairments.    Personal Factors and Comorbidities Comorbidity 1    Comorbidities L THA, very weak R hip (B weakness)    Examination-Activity Limitations Transfers;Bed Mobility;Squat;Lift;Locomotion Level;Stairs;Stand    Examination-Participation Restrictions Interpersonal Relationship;Yard Work;Community Activity    Stability/Clinical Decision Making Stable/Uncomplicated    Rehab Potential Good    PT Frequency 2x / week    PT Duration 6 weeks    PT Treatment/Interventions ADLs/Self Care Home Management;Iontophoresis 4mg /ml Dexamethasone;Therapeutic activities;Stair training;Gait training;Therapeutic exercise;Balance training;Neuromuscular re-education;Patient/family education;Dry needling    PT Next Visit Plan Continue stretching to help sleeping and encouraged consistent HEP compliance, general leg strengthening (quadriceps and hip abductors), balance (43 Berg, was 36) and gait  activities to reduce falls risk and improve gait endurance and quality. Percussion to right hip as needed.    PT Home Exercise Plan Access Code:    Consulted and Agree with Plan of Care Patient           Patient will benefit from skilled therapeutic intervention in order to improve the following deficits and impairments:  Abnormal gait,Decreased activity tolerance,Decreased balance,Decreased endurance,Decreased range of motion,Decreased strength,Difficulty walking,Pain,Impaired flexibility  Visit Diagnosis: Difficulty walking  Muscle weakness (generalized)  Stiffness of right hip, not elsewhere classified  Pain in right hip  Unsteadiness on feet     Problem List Patient Active Problem List   Diagnosis Date Noted  . Status post total replacement of left hip 02/03/2020  . Unilateral primary osteoarthritis, left hip 12/01/2019  . Low back pain 10/27/2019  . Left hip pain 10/27/2019  . Right hip pain 07/14/2017  . Chest pain 06/11/2015  . Hypothyroidism 06/11/2015    Cherlyn Cushing PT, MPT 03/08/2021, 2:15 PM  [facility] Physical Therapy [address] [place], [place], [address] Phone: [phone]   Fax:  [phone]  Name: TERUKO JOSWICK MRN: [id number] Date of Birth: 19-Feb-1941

## 2021-03-18 ENCOUNTER — Ambulatory Visit: Payer: Medicare PPO | Admitting: Physical Therapy

## 2021-03-18 ENCOUNTER — Other Ambulatory Visit: Payer: Self-pay

## 2021-03-18 ENCOUNTER — Encounter: Payer: Self-pay | Admitting: Physical Therapy

## 2021-03-18 DIAGNOSIS — M25559 Pain in unspecified hip: Secondary | ICD-10-CM

## 2021-03-18 DIAGNOSIS — R2681 Unsteadiness on feet: Secondary | ICD-10-CM

## 2021-03-18 DIAGNOSIS — M25552 Pain in left hip: Secondary | ICD-10-CM | POA: Diagnosis not present

## 2021-03-18 DIAGNOSIS — R29898 Other symptoms and signs involving the musculoskeletal system: Secondary | ICD-10-CM

## 2021-03-18 DIAGNOSIS — M25551 Pain in right hip: Secondary | ICD-10-CM

## 2021-03-18 DIAGNOSIS — M25652 Stiffness of left hip, not elsewhere classified: Secondary | ICD-10-CM | POA: Diagnosis not present

## 2021-03-18 DIAGNOSIS — R262 Difficulty in walking, not elsewhere classified: Secondary | ICD-10-CM

## 2021-03-18 DIAGNOSIS — M25651 Stiffness of right hip, not elsewhere classified: Secondary | ICD-10-CM

## 2021-03-18 DIAGNOSIS — M6281 Muscle weakness (generalized): Secondary | ICD-10-CM | POA: Diagnosis not present

## 2021-03-18 NOTE — Therapy (Signed)
[facility] Physical Therapy [address] [place], [place], [address] Phone: [phone]   Fax:  [phone]  Physical Therapy Treatment  Patient Details  Name: Brenda Ortiz MRN: [id number] Date of Birth: [birth year] Referring Provider (PT): Virgia Land. Dub Mikes PA-C   Encounter Date: 03/18/2021   PT End of Session - 03/18/21 0948    Visit Number 22    Number of Visits 30    Authorization Type [employer], 12 additional visits approved from 02/15/2021 to 04/19/2021.    Authorization - Visit Number 5    Authorization - Number of Visits 12    Progress Note Due on Visit 30    PT Start Time [phone]    PT Stop Time 1014    PT Time Calculation (min) 35 min    Equipment Utilized During Treatment Gait belt    Activity Tolerance No increased pain;Patient limited by fatigue;Patient tolerated treatment well    Behavior During Therapy [facility] for tasks assessed/performed           Past Medical History:  Diagnosis Date  . Arthritis   . Hypothyroidism   . Incontinence   . Seasonal allergies     Past Surgical History:  Procedure Laterality Date  . DILATION AND CURETTAGE OF UTERUS     x2  . FOOT SURGERY Right   . HYSTEROSCOPY WITH D & C N/A 08/14/2016   Procedure: DILATATION AND CURETTAGE /HYSTEROSCOPY;  Surgeon: Olivia Mackie, MD;  Location: [facility];  Service: Gynecology;  Laterality: N/A;  . TONSILLECTOMY    . TOTAL HIP ARTHROPLASTY Left 02/03/2020   Procedure: LEFT TOTAL HIP ARTHROPLASTY ANTERIOR APPROACH;  Surgeon: Kathryne Hitch, MD;  Location: [facility];  Service: Orthopedics;  Laterality: Left;    There were no vitals filed for this visit.   Subjective Assessment - 03/18/21 0943    Subjective Pt reporting a bad weekend. Pt reproting she was sitting at a festival all day on Friday and she thinks that may have aggrevated her hips.    Pertinent History L hip THA 01/2020    How long can you sit comfortably? Transfer from sit to stand is difficult    How long can  you stand comfortably? 15 minutes    Patient Stated Goals Return to walking for exercise without pain, sleep without interruption    Currently in Pain? Yes    Pain Score 1     Pain Location Hip    Pain Orientation Right    Pain Descriptors / Indicators Aching    Pain Type Chronic pain    Pain Onset More than a month ago                             [facility] Adult PT Treatment/Exercise - 03/18/21 0001      Neuro Re-ed    Neuro Re-ed Details  Ariex: feet togehter, feet apart, SLS, feet apart eyes closed, cone tapping, airex balance beam walking x 8   all with close supervision and intermittent UE support     Exercises   Exercises Knee/Hip      Knee/Hip Exercises: Stretches   Hip Flexor Stretch Both;3 reps;20 seconds    Hip Flexor Stretch Limitations standing    Piriformis Stretch 2 reps    Piriformis Stretch Limitations each LE    Other Knee/Hip Stretches Gluteal stretch (knee to opposite shoulder) 2 x holding 30  seconds      Knee/Hip Exercises: Machines for Strengthening  Total Gym Leg Press single leg press 31# 2x10 each LE      Knee/Hip Exercises: Standing   Hip Abduction Stengthening;Both;2 sets;15 reps                       PT Long Term Goals - 03/18/21 1003      PT LONG TERM GOAL #1   Title Brenda Ortiz will score 75 on her FOTO.    Status On-going      PT LONG TERM GOAL #2   Title Improve hip strength to 4+/5 MMT B for hip flexors and abductors.    Status On-going      PT LONG TERM GOAL #3   Title Brenda Ortiz will return to walking 1 mile or more with her home walking program.    Baseline Unable due to R hip pain and endurance deficits.    Status On-going      PT LONG TERM GOAL #4   Title Brenda Ortiz will report R hip pain consistently 0-2/10 on the Numeric Pain Rating Scale.    Status On-going      PT LONG TERM GOAL #5   Title Brenda Ortiz will be independent with her long-term maintenence HEP at [place].    Status On-going                  Plan - 03/18/21 0950    Clinical Impression Statement Pt arriving today 9 minutes late for session. Treatment focused on balance and neuromuscular education and stretching.  Pt still struggling with prolonged sitting and standing which seem to aggrevate her hip pain.We discussed working standing on pillow at home in front of her sink to work on balance. We added hip flexor stretch to pt's HEP after prolonged sitting. Continue skilled PT to progress dynamic balance, strengthening and funcitonal moblity.    Personal Factors and Comorbidities Comorbidity 1    Comorbidities L THA, very weak R hip (B weakness)    Examination-Activity Limitations Transfers;Bed Mobility;Squat;Lift;Locomotion Level;Stairs;Stand    Examination-Participation Restrictions Interpersonal Relationship;Yard Work;Community Activity    Stability/Clinical Decision Making Stable/Uncomplicated    Rehab Potential Good    PT Frequency 2x / week    PT Duration 6 weeks    PT Treatment/Interventions ADLs/Self Care Home Management;Iontophoresis 4mg /ml Dexamethasone;Therapeutic activities;Stair training;Gait training;Therapeutic exercise;Balance training;Neuromuscular re-education;Patient/family education;Dry needling    PT Next Visit Plan Continue stretching to help sleeping, general leg strengthening (quadriceps and hip abductors), balance (43 Berg, was 36) and gait activities to reduce falls risk and improve gait endurance and quality. Percussion to right hip as needed.    Consulted and Agree with Plan of Care Patient           Patient will benefit from skilled therapeutic intervention in order to improve the following deficits and impairments:  Abnormal gait,Decreased activity tolerance,Decreased balance,Decreased endurance,Decreased range of motion,Decreased strength,Difficulty walking,Pain,Impaired flexibility  Visit Diagnosis: Difficulty walking  Muscle weakness (generalized)  Stiffness of right hip, not  elsewhere classified  Pain in right hip  Unsteadiness on feet  Stiffness of left hip, not elsewhere classified  Left hip pain  Weakness of both hips  Hip pain     Problem List Patient Active Problem List   Diagnosis Date Noted  . Status post total replacement of left hip 02/03/2020  . Unilateral primary osteoarthritis, left hip 12/01/2019  . Low back pain 10/27/2019  . Left hip pain 10/27/2019  . Right hip pain 07/14/2017  . Chest pain 06/11/2015  . Hypothyroidism 06/11/2015  Sharmon Leyden, PT, MPT 03/18/2021, 10:15 AM  [facility] Physical Therapy [address] [place], [place], [address] Phone: [phone]   Fax:  [phone]  Name: NAUTIKA CRESSEY MRN: [id number] Date of Birth: 1941/06/23

## 2021-03-22 ENCOUNTER — Other Ambulatory Visit: Payer: Self-pay

## 2021-03-22 ENCOUNTER — Encounter: Payer: Self-pay | Admitting: Rehabilitative and Restorative Service Providers"

## 2021-03-22 ENCOUNTER — Ambulatory Visit: Payer: Medicare PPO | Admitting: Rehabilitative and Restorative Service Providers"

## 2021-03-22 DIAGNOSIS — R2681 Unsteadiness on feet: Secondary | ICD-10-CM

## 2021-03-22 DIAGNOSIS — R262 Difficulty in walking, not elsewhere classified: Secondary | ICD-10-CM

## 2021-03-22 DIAGNOSIS — M25552 Pain in left hip: Secondary | ICD-10-CM

## 2021-03-22 DIAGNOSIS — M25651 Stiffness of right hip, not elsewhere classified: Secondary | ICD-10-CM

## 2021-03-22 DIAGNOSIS — M25652 Stiffness of left hip, not elsewhere classified: Secondary | ICD-10-CM | POA: Diagnosis not present

## 2021-03-22 DIAGNOSIS — M6281 Muscle weakness (generalized): Secondary | ICD-10-CM | POA: Diagnosis not present

## 2021-03-22 DIAGNOSIS — M25551 Pain in right hip: Secondary | ICD-10-CM

## 2021-03-22 NOTE — Therapy (Signed)
[facility] Physical Therapy [address] [place], [place], [address] Phone: [phone]   Fax:  [phone]  Physical Therapy Treatment  Patient Details  Name: Brenda Ortiz MRN: [id number] Date of Birth: August 19, 1941 Referring Provider (PT): Virgia Land. Dub Mikes PA-C   Encounter Date: 03/22/2021   PT End of Session - 03/22/21 1445    Visit Number 23    Number of Visits 30    Date for PT Re-Evaluation 04/05/21    Authorization Type [employer], 12 additional visits approved from 02/15/2021 to 04/19/2021.    Authorization - Visit Number 6    Authorization - Number of Visits 12    Progress Note Due on Visit 29    PT Start Time 1306    PT Stop Time 1345    PT Time Calculation (min) 39 min    Equipment Utilized During Treatment Gait belt    Activity Tolerance No increased pain;Patient limited by fatigue;Patient tolerated treatment well    Behavior During Therapy [facility] for tasks assessed/performed           Past Medical History:  Diagnosis Date  . Arthritis   . Hypothyroidism   . Incontinence   . Seasonal allergies     Past Surgical History:  Procedure Laterality Date  . DILATION AND CURETTAGE OF UTERUS     x2  . FOOT SURGERY Right   . HYSTEROSCOPY WITH D & C N/A 08/14/2016   Procedure: DILATATION AND CURETTAGE /HYSTEROSCOPY;  Surgeon: Olivia Mackie, MD;  Location: [facility];  Service: Gynecology;  Laterality: N/A;  . TONSILLECTOMY    . TOTAL HIP ARTHROPLASTY Left 02/03/2020   Procedure: LEFT TOTAL HIP ARTHROPLASTY ANTERIOR APPROACH;  Surgeon: Kathryne Hitch, MD;  Location: [facility];  Service: Orthopedics;  Laterality: Left;    There were no vitals filed for this visit.   Subjective Assessment - 03/22/21 1442    Subjective Brenda Ortiz reports doing better the past few days.  Other than overdoing things at a festival last week, she is making progress with her gait.  She wants to walk better, particularly on grass and other compliant surfaces.    Pertinent  History L hip THA 01/2020    How long can you sit comfortably? Transfer from sit to stand is difficult    How long can you stand comfortably? 15 minutes    Patient Stated Goals Return to walking for exercise without pain, sleep without interruption    Currently in Pain? No/denies    Pain Score 0-No pain    Pain Location Hip    Pain Orientation Right    Pain Descriptors / Indicators Aching    Pain Type Chronic pain    Pain Radiating Towards NA    Pain Onset More than a month ago    Pain Frequency Intermittent    Aggravating Factors  Overuse    Pain Relieving Factors Brief periods of rest and exercises    Effect of Pain on Daily Activities Sleep can be affected by R hip pain and difficulty with WB endurance    Multiple Pain Sites No                             [facility] Adult PT Treatment/Exercise - 03/22/21 0001      Neuro Re-ed    Neuro Re-ed Details  Step up; HT balance; Tap-ups; 360 turns; tandem balance; long strides with gait      Exercises   Exercises Knee/Hip  Knee/Hip Exercises: Stretches   Piriformis Stretch Both;4 reps;20 seconds    Other Knee/Hip Stretches Gluteal stretch (knee to opposite shoulder) 4X 20 seconds      Knee/Hip Exercises: Seated   Sit to Sand 3 sets;5 reps;without UE support      Knee/Hip Exercises: Sidelying   Hip ABduction Strengthening;Both;2 sets;10 reps;Limitations    Hip ABduction Limitations 1/4 turn to stomach                  PT Education - 03/22/21 1444    Education Details Reviewed HEP with emphasis on hip strength and avoiding overuse.    Person(s) Educated Patient    Methods Explanation;Demonstration;Tactile cues;Verbal cues    Comprehension Verbal cues required;Returned demonstration;Need further instruction;Verbalized understanding;Tactile cues required               PT Long Term Goals - 03/22/21 1445      PT LONG TERM GOAL #1   Title Brenda Ortiz will score 75 on her FOTO.    Status On-going       PT LONG TERM GOAL #2   Title Improve hip strength to 4+/5 MMT B for hip flexors and abductors.    Status On-going      PT LONG TERM GOAL #3   Title Brenda Ortiz will return to walking 1 mile or more with her home walking program.    Baseline Unable due to R hip pain and endurance deficits.    Status On-going      PT LONG TERM GOAL #4   Title Brenda Ortiz will report R hip pain consistently 0-2/10 on the Numeric Pain Rating Scale.    Baseline Getting better at night.  Endurance and strength need work.    Status On-going      PT LONG TERM GOAL #5   Title Brenda Ortiz will be independent with her long-term maintenence HEP at DC.    Status On-going                 Plan - 03/22/21 1447    Clinical Impression Statement Brenda Ortiz looks better today with her balance, gait and strength.  She was able to stand from a chair without UE assist for the first time since starting PT.  Narrower gait is noted and step-strategy is improving.  Brenda Ortiz stil uses her hands too much vs step strategy with loss of balance and B LE strength and balance will still benefit from continued work.    Personal Factors and Comorbidities Comorbidity 1    Comorbidities L THA, very weak R hip (B weakness)    Examination-Activity Limitations Transfers;Bed Mobility;Squat;Lift;Locomotion Level;Stairs;Stand    Examination-Participation Restrictions Interpersonal Relationship;Yard Work;Community Activity    Stability/Clinical Decision Making Stable/Uncomplicated    Rehab Potential Good    PT Frequency 2x / week    PT Duration 6 weeks    PT Treatment/Interventions ADLs/Self Care Home Management;Iontophoresis 4mg /ml Dexamethasone;Therapeutic activities;Stair training;Gait training;Therapeutic exercise;Balance training;Neuromuscular re-education;Patient/family education;Dry needling    PT Next Visit Plan Continue stretching to help sleeping, general leg strengthening (quadriceps and hip abductors), balance (43 Berg, was 36) and  gait activities to reduce falls risk and improve gait endurance and quality. Percussion to right hip as needed.    PT Home Exercise Plan Access Code:    Consulted and Agree with Plan of Care Patient           Patient will benefit from skilled therapeutic intervention in order to improve the following deficits and impairments:  Abnormal gait,Decreased activity tolerance,Decreased  balance,Decreased endurance,Decreased range of motion,Decreased strength,Difficulty walking,Pain,Impaired flexibility  Visit Diagnosis: Difficulty walking  Muscle weakness (generalized)  Unsteadiness on feet  Stiffness of left hip, not elsewhere classified  Stiffness of right hip, not elsewhere classified  Left hip pain  Pain in right hip     Problem List Patient Active Problem List   Diagnosis Date Noted  . Status post total replacement of left hip 02/03/2020  . Unilateral primary osteoarthritis, left hip 12/01/2019  . Low back pain 10/27/2019  . Left hip pain 10/27/2019  . Right hip pain 07/14/2017  . Chest pain 06/11/2015  . Hypothyroidism 06/11/2015    Cherlyn Cushing PT, MPT 03/22/2021, 2:50 PM  [facility] Physical Therapy [address] [place], [place], [address] Phone: [phone]   Fax:  [phone]  Name: Brenda Ortiz MRN: [id number] Date of Birth: 1941-05-12

## 2021-03-25 ENCOUNTER — Other Ambulatory Visit: Payer: Self-pay

## 2021-03-25 ENCOUNTER — Ambulatory Visit: Payer: Medicare PPO | Admitting: Physical Therapy

## 2021-03-25 DIAGNOSIS — M25551 Pain in right hip: Secondary | ICD-10-CM

## 2021-03-25 DIAGNOSIS — M25651 Stiffness of right hip, not elsewhere classified: Secondary | ICD-10-CM

## 2021-03-25 DIAGNOSIS — R262 Difficulty in walking, not elsewhere classified: Secondary | ICD-10-CM | POA: Diagnosis not present

## 2021-03-25 DIAGNOSIS — M25652 Stiffness of left hip, not elsewhere classified: Secondary | ICD-10-CM | POA: Diagnosis not present

## 2021-03-25 DIAGNOSIS — M25552 Pain in left hip: Secondary | ICD-10-CM | POA: Diagnosis not present

## 2021-03-25 DIAGNOSIS — R29898 Other symptoms and signs involving the musculoskeletal system: Secondary | ICD-10-CM

## 2021-03-25 DIAGNOSIS — M6281 Muscle weakness (generalized): Secondary | ICD-10-CM

## 2021-03-25 DIAGNOSIS — R2681 Unsteadiness on feet: Secondary | ICD-10-CM | POA: Diagnosis not present

## 2021-03-25 NOTE — Therapy (Signed)
[facility] Physical Therapy [address] [place], [place], [address] Phone: [phone]   Fax:  [phone]  Physical Therapy Treatment  Patient Details  Name: Brenda Ortiz MRN: [id number] Date of Birth: 09-22-1941 Referring Provider (PT): Brenda Land. Dub Mikes PA-C   Encounter Date: 03/25/2021   PT End of Session - 03/25/21 1032    Visit Number 24    Number of Visits 30    Date for PT Re-Evaluation 04/05/21    Authorization Type [employer], 12 additional visits approved from 02/15/2021 to 04/19/2021.    Authorization - Visit Number 7    Authorization - Number of Visits 12    Progress Note Due on Visit 29    PT Start Time 1020    PT Stop Time 1100    PT Time Calculation (min) 40 min    Equipment Utilized During Treatment Gait belt    Activity Tolerance No increased pain;Patient limited by fatigue;Patient tolerated treatment well    Behavior During Therapy Brenda Ortiz for tasks assessed/performed           Past Medical History:  Diagnosis Date  . Arthritis   . Hypothyroidism   . Incontinence   . Seasonal allergies     Past Surgical History:  Procedure Laterality Date  . DILATION AND CURETTAGE OF UTERUS     x2  . FOOT SURGERY Right   . HYSTEROSCOPY WITH D & C N/A 08/14/2016   Procedure: DILATATION AND CURETTAGE /HYSTEROSCOPY;  Surgeon: Brenda Mackie, Brenda Ortiz;  Location: [facility];  Service: Gynecology;  Laterality: N/A;  . TONSILLECTOMY    . TOTAL HIP ARTHROPLASTY Left 02/03/2020   Procedure: LEFT TOTAL HIP ARTHROPLASTY ANTERIOR APPROACH;  Surgeon: Brenda Hitch, Brenda Ortiz;  Location: [facility];  Service: Orthopedics;  Laterality: Left;    There were no vitals filed for this visit.   Subjective Assessment - 03/25/21 1027    Subjective Pt arriving today reporting 1/10 pain in her right hip. Pt reporting Friday was an excellent day and Saturday and Sunday her pain was 4/10. Pt can't recall anything that would have aggrivated her hip.    Pertinent History L hip THA  01/2020    Limitations Standing;Walking    How long can you sit comfortably? Transfer from sit to stand is difficult    How long can you stand comfortably? 15 minutes    How long can you walk comfortably? Walking is not comfortable    Patient Stated Goals Return to walking for exercise without pain, sleep without interruption    Currently in Pain? Yes    Pain Score 1     Pain Location Hip    Pain Orientation Right    Pain Descriptors / Indicators Sore    Pain Onset More than a month ago              [name] PT Assessment - 03/25/21 0001      Standardized Balance Assessment   Standardized Balance Assessment Berg Balance Test      Berg Balance Test   Sit to Stand Able to stand  independently using hands    Standing Unsupported Able to stand safely 2 minutes    Sitting with Back Unsupported but Feet Supported on Floor or Stool Able to sit safely and securely 2 minutes    Stand to Sit Sits safely with minimal use of hands    Transfers Able to transfer safely, definite need of hands    Standing Unsupported with Eyes Closed Able to stand 10  seconds safely    Standing Unsupported with Feet Together Able to place feet together independently and stand 1 minute safely    From Standing, Reach Forward with Outstretched Arm Can reach confidently >25 cm (10")    From Standing Position, Pick up Object from Floor Able to pick up shoe safely and easily    From Standing Position, Turn to Look Behind Over each Shoulder Looks behind from both sides and weight shifts well    Turn 360 Degrees Able to turn 360 degrees safely one side only in 4 seconds or less    Standing Unsupported, Alternately Place Feet on Step/Stool Able to complete 4 steps without aid or supervision    Standing Unsupported, One Foot in Front Able to take small step independently and hold 30 seconds    Standing on One Leg Tries to lift leg/unable to hold 3 seconds but remains standing independently    Total Score 46                          [facility] Adult PT Treatment/Exercise - 03/25/21 0001      Neuro Re-ed    Neuro Re-ed Details  step taps x 20, standing on Airex feet together and apart, tandum stance      Exercises   Exercises Knee/Hip      Knee/Hip Exercises: Machines for Strengthening   Total Gym Leg Press single LE: 37# 2x15 each LE, double LE 81# x 20      Knee/Hip Exercises: Standing   Hip Abduction Stengthening;Both    SLS with Vectors slider with 3 cones, stabalizing on Right LE, single UE assist      Knee/Hip Exercises: Seated   Sit to Sand 3 sets;5 reps;without UE support                       PT Long Term Goals - 03/25/21 1102      PT LONG TERM GOAL #1   Title Brenda Ortiz will score 75 on her FOTO.    Status On-going      PT LONG TERM GOAL #2   Title Improve hip strength to 4+/5 MMT B for hip flexors and abductors.    Status On-going      PT LONG TERM GOAL #3   Title Brenda Ortiz will return to walking 1 mile or more with her home walking program.    Status On-going      PT LONG TERM GOAL #4   Title Brenda Ortiz will report R hip pain consistently 0-2/10 on the Numeric Pain Rating Scale.    Status On-going      PT LONG TERM GOAL #5   Title Brenda Ortiz will be independent with her long-term maintenence HEP at [place].    Status On-going                 Plan - 03/25/21 1056    Clinical Impression Statement Pt reporting feeling great on Friday with no pain reported. Pt reporting 4/10 pain on Saturday and Sunday. Today pt's pain was 1/10. Pt still struggling with sit to stand without using her UE's. Pt is able to perform the task with initial tactile and verbal cues for foot placement and using a rocking momentum technique. Pt's BERG improved to 46/56. Continue to progress with overall strengtheing and dynamic balance. Continue with skilled PT toward goals set.    Personal Factors and Comorbidities Comorbidity 1    Comorbidities L THA,  very weak R hip (B weakness)     Examination-Activity Limitations Transfers;Bed Mobility;Squat;Lift;Locomotion Level;Stairs;Stand    Examination-Participation Restrictions Interpersonal Relationship;Yard Work;Community Activity    Stability/Clinical Decision Making Stable/Uncomplicated    Rehab Potential Good    PT Frequency 2x / week    PT Duration 6 weeks    PT Treatment/Interventions ADLs/Self Care Home Management;Iontophoresis 4mg /ml Dexamethasone;Therapeutic activities;Stair training;Gait training;Therapeutic exercise;Balance training;Neuromuscular re-education;Patient/family education;Dry needling    PT Next Visit Plan Continue stretching to help sleeping, general leg strengthening (quadriceps and hip abductors),gait activities to reduce falls risk and improve gait endurance and quality. Percussion to right hip as needed.    PT Home Exercise Plan Access Code:    Consulted and Agree with Plan of Care Patient           Patient will benefit from skilled therapeutic intervention in order to improve the following deficits and impairments:  Abnormal gait,Decreased activity tolerance,Decreased balance,Decreased endurance,Decreased range of motion,Decreased strength,Difficulty walking,Pain,Impaired flexibility  Visit Diagnosis: Difficulty walking  Muscle weakness (generalized)  Unsteadiness on feet  Stiffness of left hip, not elsewhere classified  Stiffness of right hip, not elsewhere classified  Left hip pain  Weakness of both hips  Pain in right hip     Problem List Patient Active Problem List   Diagnosis Date Noted  . Status post total replacement of left hip 02/03/2020  . Unilateral primary osteoarthritis, left hip 12/01/2019  . Low back pain 10/27/2019  . Left hip pain 10/27/2019  . Right hip pain 07/14/2017  . Chest pain 06/11/2015  . Hypothyroidism 06/11/2015    06/13/2015, PT, MPT 03/25/2021, 11:04 AM  [facility] Physical Therapy [address] [place], [place], [place] Phone: [phone]   Fax:  [phone]  Name: Brenda Ortiz MRN: [name] Date of Birth: 05-04-1941

## 2021-03-27 ENCOUNTER — Encounter: Payer: Self-pay | Admitting: Rehabilitative and Restorative Service Providers"

## 2021-03-27 ENCOUNTER — Other Ambulatory Visit: Payer: Self-pay

## 2021-03-27 ENCOUNTER — Ambulatory Visit: Payer: Medicare PPO | Admitting: Rehabilitative and Restorative Service Providers"

## 2021-03-27 DIAGNOSIS — M25651 Stiffness of right hip, not elsewhere classified: Secondary | ICD-10-CM | POA: Diagnosis not present

## 2021-03-27 DIAGNOSIS — M25551 Pain in right hip: Secondary | ICD-10-CM

## 2021-03-27 DIAGNOSIS — R2681 Unsteadiness on feet: Secondary | ICD-10-CM | POA: Diagnosis not present

## 2021-03-27 DIAGNOSIS — R262 Difficulty in walking, not elsewhere classified: Secondary | ICD-10-CM

## 2021-03-27 DIAGNOSIS — M6281 Muscle weakness (generalized): Secondary | ICD-10-CM | POA: Diagnosis not present

## 2021-03-27 DIAGNOSIS — M25652 Stiffness of left hip, not elsewhere classified: Secondary | ICD-10-CM | POA: Diagnosis not present

## 2021-03-27 NOTE — Therapy (Signed)
[facility] Physical Therapy [address] [place], [place], [address] Phone: [phone]   Fax:  [phone]  Physical Therapy Treatment  Patient Details  Name: Brenda Ortiz MRN: [id number] Date of Birth: [birth year] Referring Provider (PT): Virgia Land. Dub Mikes PA-C   Encounter Date: 03/27/2021   PT End of Session - 03/27/21 1705    Visit Number 25    Number of Visits 30    Date for PT Re-Evaluation 04/05/21    Authorization Type [employer], 12 additional visits approved from 02/15/2021 to 04/19/2021.    Authorization - Visit Number 8    Authorization - Number of Visits 12    Progress Note Due on Visit 29    PT Start Time [phone]    PT Stop Time 1015    PT Time Calculation (min) 38 min    Equipment Utilized During Treatment Gait belt    Activity Tolerance No increased pain;Patient limited by fatigue;Patient tolerated treatment well    Behavior During Therapy [facility] for tasks assessed/performed           Past Medical History:  Diagnosis Date  . Arthritis   . Hypothyroidism   . Incontinence   . Seasonal allergies     Past Surgical History:  Procedure Laterality Date  . DILATION AND CURETTAGE OF UTERUS     x2  . FOOT SURGERY Right   . HYSTEROSCOPY WITH D & C N/A 08/14/2016   Procedure: DILATATION AND CURETTAGE /HYSTEROSCOPY;  Surgeon: Olivia Mackie, MD;  Location: [facility];  Service: Gynecology;  Laterality: N/A;  . TONSILLECTOMY    . TOTAL HIP ARTHROPLASTY Left 02/03/2020   Procedure: LEFT TOTAL HIP ARTHROPLASTY ANTERIOR APPROACH;  Surgeon: Kathryne Hitch, MD;  Location: [facility];  Service: Orthopedics;  Laterality: Left;    There were no vitals filed for this visit.   Subjective Assessment - 03/27/21 0946    Subjective Amaziah reports things have been more tender the past few days.    Pertinent History L hip THA 01/2020    Limitations Standing;Walking    How long can you sit comfortably? Transfer from sit to stand is difficult    How long can  you stand comfortably? 15 minutes    How long can you walk comfortably? Walking is not comfortable    Patient Stated Goals Return to walking for exercise without pain, sleep without interruption    Currently in Pain? Yes    Pain Score 4     Pain Location Hip    Pain Orientation Right    Pain Descriptors / Indicators Sore    Pain Type Chronic pain    Pain Radiating Towards NA    Pain Onset More than a month ago    Pain Frequency Constant    Aggravating Factors  Overuse and prolonged sitting    Pain Relieving Factors Brief periods of rest and exercise    Effect of Pain on Daily Activities Difficulty with prolonged postures, prolonged WB and sleep    Multiple Pain Sites No                             [facility] Adult PT Treatment/Exercise - 03/27/21 0001      Neuro Re-ed    Neuro Re-ed Details  Step up; lateral stepping; 360 turns; tandem balance; long strides with gait      Exercises   Exercises Knee/Hip      Knee/Hip Exercises: Stretches   Piriformis Stretch  Both;4 reps;20 seconds    Other Knee/Hip Stretches Gluteal stretch (knee to opposite shoulder) 4X 20 seconds      Knee/Hip Exercises: Seated   Sit to Sand 3 sets;5 reps;without UE support      Knee/Hip Exercises: Sidelying   Hip ABduction Strengthening;Both;2 sets;10 reps;Limitations    Hip ABduction Limitations 1/4 turn to stomach                  PT Education - 03/27/21 1701    Education Details Reviewed HEP with emphasis on 2 hip stretches, sit to stand and hip abductors strengthening.    Person(s) Educated Patient    Methods Explanation;Demonstration;Tactile cues;Verbal cues    Comprehension Verbal cues required;Returned demonstration;Need further instruction;Verbalized understanding;Tactile cues required               PT Long Term Goals - 03/27/21 1703      PT LONG TERM GOAL #1   Title Jude will score 75 on her FOTO.    Baseline 59    Time 4    Period Weeks    Status  On-going    Target Date 04/24/21      PT LONG TERM GOAL #2   Title Improve hip strength to 4+/5 MMT B for hip flexors and abductors.    Baseline 3+/5    Time 4    Period Weeks    Status On-going    Target Date 04/24/21      PT LONG TERM GOAL #3   Title Maxima will return to walking 1 mile or more with her home walking program.    Time 4    Period Weeks    Status On-going    Target Date 04/24/21      PT LONG TERM GOAL #4   Title Mykenzi will report R hip pain consistently 0-2/10 on the Numeric Pain Rating Scale.    Baseline Getting better at night.  Endurance and strength need work.    Time 4    Period Weeks    Status On-going    Target Date 04/24/21      PT LONG TERM GOAL #5   Title Ferris will be independent with her long-term maintenence HEP at [place].    Status On-going    Target Date 04/24/21                 Plan - 03/27/21 1705    Clinical Impression Statement Tahlia has improved her gait quality and balance since starting PT.  R hip pain has ups and downs with the last few days being more painful.  Lasheka's prognosis to reduce R hip pain is good with consistent HEP compliance.  RA in another 2 weeks to assess objective and functional progress towards long-term goals.    Personal Factors and Comorbidities Comorbidity 1    Comorbidities L THA, very weak R hip (B weakness)    Examination-Activity Limitations Transfers;Bed Mobility;Squat;Lift;Locomotion Level;Stairs;Stand    Examination-Participation Restrictions Interpersonal Relationship;Yard Work;Community Activity    Stability/Clinical Decision Making Stable/Uncomplicated    Rehab Potential Good    PT Frequency 2x / week    PT Duration 6 weeks    PT Treatment/Interventions ADLs/Self Care Home Management;Iontophoresis 4mg /ml Dexamethasone;Therapeutic activities;Stair training;Gait training;Therapeutic exercise;Balance training;Neuromuscular re-education;Patient/family education;Dry needling    PT Next  Visit Plan Continue stretching to help sleeping, general leg strengthening (quadriceps and hip abductors),gait activities to reduce falls risk and improve gait endurance and quality. Percussion to right hip as needed.  PT Home Exercise Plan Access Code: [email or web address]    Consulted and Agree with Plan of Care Patient           Patient will benefit from skilled therapeutic intervention in order to improve the following deficits and impairments:  Abnormal gait,Decreased activity tolerance,Decreased balance,Decreased endurance,Decreased range of motion,Decreased strength,Difficulty walking,Pain,Impaired flexibility  Visit Diagnosis: Difficulty walking  Muscle weakness (generalized)  Unsteadiness on feet  Stiffness of left hip, not elsewhere classified  Stiffness of right hip, not elsewhere classified  Pain in right hip     Problem List Patient Active Problem List   Diagnosis Date Noted  . Status post total replacement of left hip 02/03/2020  . Unilateral primary osteoarthritis, left hip 12/01/2019  . Low back pain 10/27/2019  . Left hip pain 10/27/2019  . Right hip pain 07/14/2017  . Chest pain 06/11/2015  . Hypothyroidism 06/11/2015    Cherlyn Cushing PT, MPT 03/27/2021, 5:08 PM  [facility] Physical Therapy [address] [place], [place], [address] Phone: [phone]   Fax:  [phone]  Name: Brenda Ortiz MRN: [id number] Date of Birth: 11/19/1940

## 2021-04-01 ENCOUNTER — Other Ambulatory Visit: Payer: Self-pay

## 2021-04-01 ENCOUNTER — Ambulatory Visit: Payer: Medicare PPO | Admitting: Physical Therapy

## 2021-04-01 ENCOUNTER — Encounter: Payer: Self-pay | Admitting: Physical Therapy

## 2021-04-01 DIAGNOSIS — R29898 Other symptoms and signs involving the musculoskeletal system: Secondary | ICD-10-CM | POA: Diagnosis not present

## 2021-04-01 DIAGNOSIS — M25651 Stiffness of right hip, not elsewhere classified: Secondary | ICD-10-CM

## 2021-04-01 DIAGNOSIS — R2681 Unsteadiness on feet: Secondary | ICD-10-CM | POA: Diagnosis not present

## 2021-04-01 DIAGNOSIS — M6281 Muscle weakness (generalized): Secondary | ICD-10-CM | POA: Diagnosis not present

## 2021-04-01 DIAGNOSIS — M25559 Pain in unspecified hip: Secondary | ICD-10-CM | POA: Diagnosis not present

## 2021-04-01 DIAGNOSIS — R262 Difficulty in walking, not elsewhere classified: Secondary | ICD-10-CM

## 2021-04-01 DIAGNOSIS — M25552 Pain in left hip: Secondary | ICD-10-CM | POA: Diagnosis not present

## 2021-04-01 DIAGNOSIS — M25551 Pain in right hip: Secondary | ICD-10-CM

## 2021-04-01 DIAGNOSIS — M25652 Stiffness of left hip, not elsewhere classified: Secondary | ICD-10-CM

## 2021-04-01 NOTE — Therapy (Signed)
[facility] Physical Therapy [address] [place], [place], [address] Phone: [phone]   Fax:  [phone]  Physical Therapy Treatment  Patient Details  Name: Brenda Ortiz MRN: [id number] Date of Birth: April 28, 1941 Referring Provider (PT): Virgia Land. Dub Mikes PA-C   Encounter Date: 04/01/2021   PT End of Session - 04/01/21 0952    Visit Number 26    Number of Visits 30    Date for PT Re-Evaluation 04/05/21    Authorization Type [employer], 12 additional visits approved from 02/15/2021 to 04/19/2021.    Authorization - Visit Number 9    Authorization - Number of Visits 12    Progress Note Due on Visit 29    PT Start Time 0930    PT Stop Time 1010    PT Time Calculation (min) 40 min    Equipment Utilized During Treatment Gait belt    Activity Tolerance No increased pain;Patient limited by fatigue;Patient tolerated treatment well    Behavior During Therapy [facility] for tasks assessed/performed           Past Medical History:  Diagnosis Date  . Arthritis   . Hypothyroidism   . Incontinence   . Seasonal allergies     Past Surgical History:  Procedure Laterality Date  . DILATION AND CURETTAGE OF UTERUS     x2  . FOOT SURGERY Right   . HYSTEROSCOPY WITH D & C N/A 08/14/2016   Procedure: DILATATION AND CURETTAGE /HYSTEROSCOPY;  Surgeon: Olivia Mackie, MD;  Location: [facility];  Service: Gynecology;  Laterality: N/A;  . TONSILLECTOMY    . TOTAL HIP ARTHROPLASTY Left 02/03/2020   Procedure: LEFT TOTAL HIP ARTHROPLASTY ANTERIOR APPROACH;  Surgeon: Kathryne Hitch, MD;  Location: [facility];  Service: Orthopedics;  Laterality: Left;    There were no vitals filed for this visit.   Subjective Assessment - 04/01/21 0950    Subjective Pt arriving reporting pain when walking up the steps with full weight bearing of 2/10.    Pertinent History L hip THA 01/2020    Limitations Standing;Walking    How long can you sit comfortably? Transfer from sit to stand is  difficult    How long can you stand comfortably? 15 minutes    How long can you walk comfortably? Walking is not comfortable    Patient Stated Goals Return to walking for exercise without pain, sleep without interruption    Currently in Pain? Yes    Pain Score 2     Pain Location Hip    Pain Orientation Right    Pain Descriptors / Indicators Sore    Pain Type Chronic pain    Pain Onset More than a month ago    Pain Frequency Intermittent                             [facility] Adult PT Treatment/Exercise - 04/01/21 0001      Exercises   Exercises Knee/Hip      Knee/Hip Exercises: Stretches   Piriformis Stretch 3 reps;20 seconds    Other Knee/Hip Stretches Gluteal stretch (knee to opposite shoulder) 4X 20 seconds      Knee/Hip Exercises: Machines for Strengthening   Total Gym Leg Press 87# bilateral LE's 2x10, R LE only 31# 2x10      Knee/Hip Exercises: Standing   Hip Abduction Stengthening;2 sets;10 reps    Abduction Limitations verbal cues to lead with heel on second set  SLS with Vectors balance on slider disc with stable leg the right. x 15 reaching toward 2 cones    Other Standing Knee Exercises Airex: feet togehter x 30 seconds, staggered stance x 30 each LE forward x 30 seconds      Knee/Hip Exercises: Seated   Sit to Sand 3 sets;5 reps;without UE support   instructions in foot placement and rocking stragedy     Knee/Hip Exercises: Sidelying   Hip ABduction 1 set;Strengthening;10 reps    Hip ABduction Limitations 1/4 turn to stomach with abduction and extension   Pt demonstrated technique                      PT Long Term Goals - 04/01/21 0959      PT LONG TERM GOAL #1   Title Brenda Ortiz will score 75 on her FOTO.    Status On-going      PT LONG TERM GOAL #2   Title Improve hip strength to 4+/5 MMT B for hip flexors and abductors.    Status On-going      PT LONG TERM GOAL #3   Title Brenda Ortiz will return to walking 1 mile or more with  her home walking program.    Status On-going      PT LONG TERM GOAL #4   Title Brenda Ortiz will report R hip pain consistently 0-2/10 on the Numeric Pain Rating Scale.    Status On-going      PT LONG TERM GOAL #5   Title Brenda Ortiz will be independent with her long-term maintenence HEP at [place].    Status On-going                 Plan - 04/01/21 0953    Clinical Impression Statement Pt is continuing to make prgress with gait and strengthening. Pt still reporting pain with full right LE weightbearing activities such as stair navigation.Pt still having mild difficulty with performing sit to stand without UE support. Pt requring verbal and tactile cues for foot placement and using rocking technique.  Pt still progressing towrad LTG's to mazimize functional status.    Personal Factors and Comorbidities Comorbidity 1    Comorbidities L THA, very weak R hip (B weakness)    Examination-Activity Limitations Transfers;Bed Mobility;Squat;Lift;Locomotion Level;Stairs;Stand    Examination-Participation Restrictions Interpersonal Relationship;Yard Work;Community Activity    Stability/Clinical Decision Making Stable/Uncomplicated    Rehab Potential Good    PT Frequency 2x / week    PT Duration 6 weeks    PT Treatment/Interventions ADLs/Self Care Home Management;Iontophoresis 4mg /ml Dexamethasone;Therapeutic activities;Stair training;Gait training;Therapeutic exercise;Balance training;Neuromuscular re-education;Patient/family education;Dry needling    PT Next Visit Plan Continue stretching to help sleeping, general leg strengthening (quadriceps and hip abductors),gait activities to reduce falls risk and improve gait endurance and quality. Percussion to right hip as needed.    PT Home Exercise Plan Access Code:    Consulted and Agree with Plan of Care Patient           Patient will benefit from skilled therapeutic intervention in order to improve the following deficits and impairments:   Abnormal gait,Decreased activity tolerance,Decreased balance,Decreased endurance,Decreased range of motion,Decreased strength,Difficulty walking,Pain,Impaired flexibility  Visit Diagnosis: Difficulty walking  Muscle weakness (generalized)  Unsteadiness on feet  Stiffness of left hip, not elsewhere classified  Stiffness of right hip, not elsewhere classified  Pain in right hip  Left hip pain  Weakness of both hips  Hip pain     Problem List Patient Active Problem  List   Diagnosis Date Noted  . Status post total replacement of left hip 02/03/2020  . Unilateral primary osteoarthritis, left hip 12/01/2019  . Low back pain 10/27/2019  . Left hip pain 10/27/2019  . Right hip pain 07/14/2017  . Chest pain 06/11/2015  . Hypothyroidism 06/11/2015    Sharmon Leyden, PT, MPT 04/01/2021, 10:15 AM  [facility] Physical Therapy [address] [place], [place], [address] Phone: [phone]   Fax:  [phone]  Name: Brenda Ortiz MRN: [id number] Date of Birth: 1941/05/28

## 2021-04-03 ENCOUNTER — Encounter: Payer: Medicare PPO | Admitting: Rehabilitative and Restorative Service Providers"

## 2021-04-04 ENCOUNTER — Other Ambulatory Visit: Payer: Self-pay

## 2021-04-04 ENCOUNTER — Encounter: Payer: Self-pay | Admitting: Rehabilitative and Restorative Service Providers"

## 2021-04-04 ENCOUNTER — Encounter: Payer: Medicare PPO | Admitting: Rehabilitative and Restorative Service Providers"

## 2021-04-04 ENCOUNTER — Ambulatory Visit: Payer: Medicare PPO | Admitting: Rehabilitative and Restorative Service Providers"

## 2021-04-04 DIAGNOSIS — R262 Difficulty in walking, not elsewhere classified: Secondary | ICD-10-CM | POA: Diagnosis not present

## 2021-04-04 DIAGNOSIS — M25652 Stiffness of left hip, not elsewhere classified: Secondary | ICD-10-CM | POA: Diagnosis not present

## 2021-04-04 DIAGNOSIS — R2681 Unsteadiness on feet: Secondary | ICD-10-CM

## 2021-04-04 DIAGNOSIS — M25551 Pain in right hip: Secondary | ICD-10-CM | POA: Diagnosis not present

## 2021-04-04 DIAGNOSIS — M25651 Stiffness of right hip, not elsewhere classified: Secondary | ICD-10-CM

## 2021-04-04 DIAGNOSIS — M6281 Muscle weakness (generalized): Secondary | ICD-10-CM | POA: Diagnosis not present

## 2021-04-04 NOTE — Therapy (Signed)
[facility] Physical Therapy [address] [place], [place], [address] Phone: [phone]   Fax:  [phone]  Physical Therapy Treatment/Reassessment  Patient Details  Name: Brenda Ortiz MRN: [id number] Date of Birth: 23-Jul-1941 Referring Provider (PT): Virgia Land. Dub Mikes PA-C  Referring diagnosis? M70.61 Treatment diagnosis? (if different than referring diagnosis) R26.2  M62.81  R26.81  M25.652  M25.651  M25.551 What was this (referring dx) caused by? []  Surgery []  Fall [x]  Ongoing issue [x]  Arthritis []  Other: ____________  Laterality: [x]  Rt []  Lt []  Both  Check all possible CPT codes:      [x]  97110 (Therapeutic Exercise)  []  92507 (SLP Treatment)  [x]  97112 (Neuro Re-ed)   []  92526 (Swallowing Treatment)   [x]  97116 (Gait Training)   []  (Cognitive Training, 1st 15 minutes) [x]  97140 (Manual Therapy)   []  97130 (Cognitive Training, each add'l 15 minutes)  [x]  97530 (Therapeutic Activities)  []  Other, List CPT Code ____________    [x]  97535 (Self Care)       [x]  All codes above (97110 - 97535)  []  (Mechanical Traction)  []  97014 (E-stim Unattended)  []  97032 (E-stim manual)  [x]  97033 (Ionto)  []  97035 (Ultrasound)  []  97760 (Orthotic Fit) [x]  97750 (Physical Performance Training) []  (Aquatic Therapy) []  97034 (Contrast Bath) []  (Paraffin) []  97597 (Wound Care 1st 20 sq cm) []  97598 (Wound Care each add'l 20 sq cm) []  97016 (Vasopneumatic Device) []  (Orthotic Training) []  (Prosthetic Training)  Encounter Date: 04/04/2021   PT End of Session - 04/04/21 1607    Visit Number 27    Number of Visits 30    Date for PT Re-Evaluation 05/03/21    Authorization Type [employer], 12 additional visits approved from 02/15/2021 to 04/19/2021.    Authorization - Visit Number 10    Authorization - Number of Visits 12    Progress Note Due on Visit 30    PT Start Time 1317    PT Stop Time 1400    PT Time Calculation  (min) 43 min    Equipment Utilized During Treatment Gait belt    Activity Tolerance No increased pain;Patient limited by fatigue;Patient tolerated treatment well    Behavior During Therapy [facility] for tasks assessed/performed           Past Medical History:  Diagnosis Date  . Arthritis   . Hypothyroidism   . Incontinence   . Seasonal allergies     Past Surgical History:  Procedure Laterality Date  . DILATION AND CURETTAGE OF UTERUS     x2  . FOOT SURGERY Right   . HYSTEROSCOPY WITH D & C N/A 08/14/2016   Procedure: DILATATION AND CURETTAGE /HYSTEROSCOPY;  Surgeon: , MD;  Location: [facility];  Service: Gynecology;  Laterality: N/A;  . TONSILLECTOMY    . TOTAL HIP ARTHROPLASTY Left 02/03/2020   Procedure: LEFT TOTAL HIP ARTHROPLASTY ANTERIOR APPROACH;  Surgeon: , MD;  Location: [facility];  Service: Orthopedics;  Laterality: Left;    There were no vitals filed for this visit.   Subjective Assessment - 04/04/21 1603    Subjective Brenda Ortiz reports low level R hip pain and more concerns with weakness and balance impairments.    Pertinent History L hip THA 01/2020    Limitations Standing;Walking    How long can you sit comfortably? Transfer from sit to stand is difficult    How long can you stand comfortably? 15 minutes    How long  can you walk comfortably? Walking is not comfortable    Patient Stated Goals Return to walking for exercise without pain, sleep without interruption    Currently in Pain? Yes    Pain Score 2     Pain Location Hip    Pain Orientation Right    Pain Descriptors / Indicators Aching    Pain Type Chronic pain    Pain Radiating Towards NA    Pain Onset More than a month ago    Pain Frequency Intermittent    Aggravating Factors  Overuse and prolonged sitting    Pain Relieving Factors Brief change of position and exercises    Effect of Pain on Daily Activities Difficulty with prolonged postures, prolonged WB and sleep    Multiple  Pain Sites No              [facility] PT Assessment - 04/04/21 0001      Observation/Other Assessments   Focus on Therapeutic Outcomes (FOTO)  56 (Goal 75)                         [facility] Adult PT Treatment/Exercise - 04/04/21 0001      Neuro Re-ed    Neuro Re-ed Details  Step up and over 8 inch step with no hands; 360 turns; tandem balance; long strides with gait      Exercises   Exercises Knee/Hip      Knee/Hip Exercises: Stretches   Piriformis Stretch 3 reps;20 seconds    Other Knee/Hip Stretches Gluteal stretch (knee to opposite shoulder) 3X 20 seconds      Knee/Hip Exercises: Machines for Strengthening   Total Gym Leg Press 87# double leg and 50# single leg (R only) 2 sets of 15 each slow eccentrics      Knee/Hip Exercises: Seated   Sit to Sand 3 sets;5 reps;without UE support      Knee/Hip Exercises: Sidelying   Hip ABduction Strengthening;Both;2 sets;10 reps;Limitations    Hip ABduction Limitations 1/4 turn to stomach                  PT Education - 04/04/21 1605    Education Details Reviewed emphasis on 2 hip stretches, sit to stand and hip abductors strengthening.    Person(s) Educated Patient    Methods Explanation;Demonstration;Tactile cues;Verbal cues    Comprehension Verbal cues required;Need further instruction;Returned demonstration;Verbalized understanding;Tactile cues required               PT Long Term Goals - 04/04/21 1606      PT LONG TERM GOAL #1   Title Brenda Ortiz will score 75 on her FOTO.    Baseline 56    Time 4    Period Weeks    Status On-going    Target Date 04/24/21      PT LONG TERM GOAL #2   Title Improve hip strength to 4+/5 MMT B for hip flexors and abductors.    Baseline 3+/5    Time 4    Period Weeks    Status On-going    Target Date 04/24/21      PT LONG TERM GOAL #3   Title Brenda Ortiz will return to walking 1 mile or more with her home walking program.    Baseline Unable due to R hip pain and  endurance deficits.    Time 4    Period Weeks    Status On-going    Target Date 04/24/21  PT LONG TERM GOAL #4   Title Brenda Ortiz will report R hip pain consistently 0-2/10 on the Numeric Pain Rating Scale.    Baseline Getting better at night.  Endurance and strength need work.    Time 4    Status Achieved      PT LONG TERM GOAL #5   Title Brenda Ortiz will be independent with her long-term maintenence HEP at [place].    Time 4    Period Weeks    Status On-going    Target Date 04/24/21                 Plan - 04/04/21 1608    Clinical Impression Statement Brenda Ortiz is now able to stand from a chair without UE support.  Balance is inconsistent.  Berg score has improved to 46 (was 36) making her a lower risk of future falls.  Brenda Ortiz has a low level R hip pain but is more limited by stiffness at night and apprehension with stairs and balance.  Consistent HEP compliance will help.  Brenda Ortiz will benefit from an additional 4 weeks of hip strength work and balance to improve self-reported function, ability to go up and down stairs and reduce R hip pain at night.    Personal Factors and Comorbidities Comorbidity 1    Comorbidities L THA, very weak R hip (B weakness)    Examination-Activity Limitations Transfers;Bed Mobility;Squat;Lift;Locomotion Level;Stairs;Stand    Examination-Participation Restrictions Interpersonal Relationship;Yard Work;Community Activity    Stability/Clinical Decision Making Stable/Uncomplicated    Rehab Potential Good    PT Frequency 2x / week    PT Duration 4 weeks    PT Treatment/Interventions ADLs/Self Care Home Management;Iontophoresis 4mg /ml Dexamethasone;Therapeutic activities;Stair training;Gait training;Therapeutic exercise;Balance training;Neuromuscular re-education;Patient/family education;Dry needling    PT Next Visit Plan Continue stretching to help sleeping, general leg strengthening (quadriceps and hip abductors),gait activities to reduce falls risk and  improve gait endurance and quality.    PT Home Exercise Plan Access Code:    Consulted and Agree with Plan of Care Patient           Patient will benefit from skilled therapeutic intervention in order to improve the following deficits and impairments:  Abnormal gait,Decreased activity tolerance,Decreased balance,Decreased endurance,Decreased range of motion,Decreased strength,Difficulty walking,Pain,Impaired flexibility  Visit Diagnosis: Difficulty walking  Muscle weakness (generalized)  Unsteadiness on feet  Stiffness of left hip, not elsewhere classified  Stiffness of right hip, not elsewhere classified  Pain in right hip     Problem List Patient Active Problem List   Diagnosis Date Noted  . Status post total replacement of left hip 02/03/2020  . Unilateral primary osteoarthritis, left hip 12/01/2019  . Low back pain 10/27/2019  . Left hip pain 10/27/2019  . Right hip pain 07/14/2017  . Chest pain 06/11/2015  . Hypothyroidism 06/11/2015    06/13/2015 PT, MPT 04/04/2021, 4:13 PM  [facility] Physical Therapy [address] [place], [place], [place] Phone: [phone]   Fax:  [phone]  Name: DIAMON REDDINGER MRN: [name] Date of Birth: 25-Jan-1941

## 2021-04-12 ENCOUNTER — Ambulatory Visit: Payer: Medicare PPO | Admitting: Rehabilitative and Restorative Service Providers"

## 2021-04-12 ENCOUNTER — Encounter: Payer: Self-pay | Admitting: Rehabilitative and Restorative Service Providers"

## 2021-04-12 ENCOUNTER — Other Ambulatory Visit: Payer: Self-pay

## 2021-04-12 DIAGNOSIS — M25651 Stiffness of right hip, not elsewhere classified: Secondary | ICD-10-CM | POA: Diagnosis not present

## 2021-04-12 DIAGNOSIS — R262 Difficulty in walking, not elsewhere classified: Secondary | ICD-10-CM

## 2021-04-12 DIAGNOSIS — M25551 Pain in right hip: Secondary | ICD-10-CM | POA: Diagnosis not present

## 2021-04-12 DIAGNOSIS — R2681 Unsteadiness on feet: Secondary | ICD-10-CM

## 2021-04-12 DIAGNOSIS — M6281 Muscle weakness (generalized): Secondary | ICD-10-CM

## 2021-04-12 DIAGNOSIS — M25652 Stiffness of left hip, not elsewhere classified: Secondary | ICD-10-CM | POA: Diagnosis not present

## 2021-04-12 NOTE — Therapy (Signed)
[facility] Physical Therapy [address] [place], [place], [address] Phone: [phone]   Fax:  [phone]  Physical Therapy Treatment  Patient Details  Name: Brenda Ortiz MRN: [id number] Date of Birth: 01/19/1941 Referring Provider (PT): Virgia Land. Dub Mikes PA-C   Encounter Date: 04/12/2021   PT End of Session - 04/12/21 1652    Visit Number 28    Number of Visits 30    Date for PT Re-Evaluation 05/03/21    Authorization Type [employer], 12 additional visits approved from 02/15/2021 to 04/19/2021.    Authorization - Visit Number 11    Authorization - Number of Visits 12    Progress Note Due on Visit 29    PT Start Time 0933    PT Stop Time 1015    PT Time Calculation (min) 42 min    Equipment Utilized During Treatment Gait belt    Activity Tolerance No increased pain;Patient limited by fatigue;Patient tolerated treatment well    Behavior During Therapy [facility] for tasks assessed/performed           Past Medical History:  Diagnosis Date  . Arthritis   . Hypothyroidism   . Incontinence   . Seasonal allergies     Past Surgical History:  Procedure Laterality Date  . DILATION AND CURETTAGE OF UTERUS     x2  . FOOT SURGERY Right   . HYSTEROSCOPY WITH D & C N/A 08/14/2016   Procedure: DILATATION AND CURETTAGE /HYSTEROSCOPY;  Surgeon: Olivia Mackie, MD;  Location: [facility];  Service: Gynecology;  Laterality: N/A;  . TONSILLECTOMY    . TOTAL HIP ARTHROPLASTY Left 02/03/2020   Procedure: LEFT TOTAL HIP ARTHROPLASTY ANTERIOR APPROACH;  Surgeon: Kathryne Hitch, MD;  Location: [facility];  Service: Orthopedics;  Laterality: Left;    There were no vitals filed for this visit.   Subjective Assessment - 04/12/21 1000    Subjective Sun was doing a lot of standing and walking while in [place].  She also had to do a lot of sitting in a car.  She is a bit sore and stiff today.    Pertinent History L hip THA 01/2020    Limitations Standing;Walking    How  long can you sit comfortably? Can sit for up to an hour.  Sit to stand is improving but difficult.    How long can you stand comfortably? 1 hour    How long can you walk comfortably? 30 minutes but is slow    Patient Stated Goals Return to walking for exercise without pain, sleep without interruption    Currently in Pain? Yes    Pain Score 2     Pain Location Hip    Pain Orientation Right    Pain Descriptors / Indicators Aching;Sharp;Tightness    Pain Type Chronic pain    Pain Radiating Towards NA    Pain Onset More than a month ago    Pain Frequency Intermittent    Aggravating Factors  Overuse and mostly prolonged sitting    Pain Relieving Factors Brief change of position and exercises    Effect of Pain on Daily Activities Difficulty with prolonged sitting, sit to stand, balance and stairs    Multiple Pain Sites No                             [facility] Adult PT Treatment/Exercise - 04/12/21 0001      Neuro Re-ed    Neuro Re-ed Details  Step up and over 6 and 8 inch step no hands, static tandem balance on foam, dynamic heel to toe and rapid paced gait with PT      Exercises   Exercises Knee/Hip      Knee/Hip Exercises: Stretches   Piriformis Stretch 3 reps;20 seconds    Other Knee/Hip Stretches Gluteal stretch (knee to opposite shoulder) 3X 20 seconds      Knee/Hip Exercises: Machines for Strengthening   Total Gym Leg Press 100# double leg slow eccentrics 2 sets of 15      Knee/Hip Exercises: Seated   Sit to Sand 2 sets;5 reps;without UE support      Knee/Hip Exercises: Sidelying   Hip ABduction Strengthening;Both;2 sets;10 reps;Limitations    Hip ABduction Limitations 1/4 turn to stomach                  PT Education - 04/12/21 1651    Education Details Reviewed balance, flexibility and strength activities emphasized with Brenda Ortiz's HEP.    Person(s) Educated Patient    Methods Explanation;Tactile cues;Verbal cues    Comprehension Verbal cues  required;Returned demonstration;Need further instruction;Verbalized understanding;Tactile cues required               PT Long Term Goals - 04/12/21 1652      PT LONG TERM GOAL #1   Title Brenda Ortiz will score 75 on her FOTO.    Baseline 56    Time 4    Period Weeks    Status On-going      PT LONG TERM GOAL #2   Title Improve hip strength to 4+/5 MMT B for hip flexors and abductors.    Baseline 3+/5    Time 4    Period Weeks    Status On-going      PT LONG TERM GOAL #3   Title Brenda Ortiz will return to walking 1 mile or more with her home walking program.    Baseline Unable due to R hip pain and endurance deficits.    Time 4    Period Weeks    Status On-going      PT LONG TERM GOAL #4   Title Brenda Ortiz will report R hip pain consistently 0-2/10 on the Numeric Pain Rating Scale.    Baseline Getting better at night.  Endurance and strength need work.    Time 4    Status Achieved      PT LONG TERM GOAL #5   Title Brenda Ortiz will be independent with her long-term maintenence HEP at [place].    Time 4    Period Weeks    Status On-going                 Plan - 04/12/21 1653    Clinical Impression Statement Brenda Ortiz is feeling the weather and the increase in activities over the past week (car ride to [place], prolonged standing and walking).  She reports not doing her hip abductors strength work while gone.  Although progress is noted, it has been slow and would benefit from consistent HEP compliance.  RA next visit to objectively assess progress and Brenda Ortiz appropriateness for continued rehabilitation.    Personal Factors and Comorbidities Comorbidity 1    Comorbidities L THA, very weak R hip (B weakness)    Examination-Activity Limitations Transfers;Bed Mobility;Squat;Lift;Locomotion Level;Stairs;Stand    Examination-Participation Restrictions Interpersonal Relationship;Yard Work;Community Activity    Stability/Clinical Decision Making Stable/Uncomplicated    Rehab  Potential Good    PT Frequency 2x / week  PT Duration 4 weeks    PT Treatment/Interventions ADLs/Self Care Home Management;Iontophoresis 4mg /ml Dexamethasone;Therapeutic activities;Stair training;Gait training;Therapeutic exercise;Balance training;Neuromuscular re-education;Patient/family education;Dry needling    PT Next Visit Plan RA and encourage HEP compliance    PT Home Exercise Plan Access Code:    Consulted and Agree with Plan of Care Patient           Patient will benefit from skilled therapeutic intervention in order to improve the following deficits and impairments:  Abnormal gait,Decreased activity tolerance,Decreased balance,Decreased endurance,Decreased range of motion,Decreased strength,Difficulty walking,Pain,Impaired flexibility  Visit Diagnosis: Difficulty walking  Muscle weakness (generalized)  Unsteadiness on feet  Stiffness of left hip, not elsewhere classified  Stiffness of right hip, not elsewhere classified  Pain in right hip     Problem List Patient Active Problem List   Diagnosis Date Noted  . Status post total replacement of left hip 02/03/2020  . Unilateral primary osteoarthritis, left hip 12/01/2019  . Low back pain 10/27/2019  . Left hip pain 10/27/2019  . Right hip pain 07/14/2017  . Chest pain 06/11/2015  . Hypothyroidism 06/11/2015    06/13/2015 PT, MPT 04/12/2021, 4:56 PM  [facility] Physical Therapy [address] [place], [place], [place] Phone: [phone]   Fax:  [phone]  Name: EZABELLA TESKA MRN: [name] Date of Birth: 02-09-1941

## 2021-04-16 ENCOUNTER — Other Ambulatory Visit: Payer: Self-pay

## 2021-04-16 ENCOUNTER — Encounter: Payer: Self-pay | Admitting: Physical Therapy

## 2021-04-16 ENCOUNTER — Ambulatory Visit: Payer: Medicare PPO | Admitting: Physical Therapy

## 2021-04-16 DIAGNOSIS — M25551 Pain in right hip: Secondary | ICD-10-CM | POA: Diagnosis not present

## 2021-04-16 DIAGNOSIS — R29898 Other symptoms and signs involving the musculoskeletal system: Secondary | ICD-10-CM

## 2021-04-16 DIAGNOSIS — M25552 Pain in left hip: Secondary | ICD-10-CM

## 2021-04-16 DIAGNOSIS — M25651 Stiffness of right hip, not elsewhere classified: Secondary | ICD-10-CM | POA: Diagnosis not present

## 2021-04-16 DIAGNOSIS — M6281 Muscle weakness (generalized): Secondary | ICD-10-CM

## 2021-04-16 DIAGNOSIS — R262 Difficulty in walking, not elsewhere classified: Secondary | ICD-10-CM | POA: Diagnosis not present

## 2021-04-16 DIAGNOSIS — M25652 Stiffness of left hip, not elsewhere classified: Secondary | ICD-10-CM

## 2021-04-16 DIAGNOSIS — R2681 Unsteadiness on feet: Secondary | ICD-10-CM | POA: Diagnosis not present

## 2021-04-16 NOTE — Therapy (Addendum)
[facility] Physical Therapy [address] [place], [place], [address] Phone: [phone]   Fax:  [phone]  Physical Therapy Treatment Discharge Summary  Patient Details  Name: Brenda Ortiz MRN: [id number] Date of Birth: [birth year] Referring Provider (PT): Brenda Ortiz. Brenda Jarvis PA-C   Encounter Date: 04/16/2021   PT End of Session - 04/16/21 1322    Visit Number 29    Number of Visits 30    Date for PT Re-Evaluation 05/03/21    Authorization Type [employer], 12 additional visits approved from 02/15/2021 to 04/19/2021.    Authorization - Visit Number 12    Authorization - Number of Visits 12    Progress Note Due on Visit 29    PT Start Time 1300    PT Stop Time 1340    PT Time Calculation (min) 40 min    Activity Tolerance No increased pain;Patient limited by fatigue;Patient tolerated treatment well    Behavior During Therapy [facility] for tasks assessed/performed           Past Medical History:  Diagnosis Date  . Arthritis   . Hypothyroidism   . Incontinence   . Seasonal allergies     Past Surgical History:  Procedure Laterality Date  . DILATION AND CURETTAGE OF UTERUS     x2  . FOOT SURGERY Right   . HYSTEROSCOPY WITH D & C N/A 08/14/2016   Procedure: DILATATION AND CURETTAGE /HYSTEROSCOPY;  Surgeon: Brenda Few, MD;  Location: [facility];  Service: Gynecology;  Laterality: N/A;  . TONSILLECTOMY    . TOTAL HIP ARTHROPLASTY Left 02/03/2020   Procedure: LEFT TOTAL HIP ARTHROPLASTY ANTERIOR APPROACH;  Surgeon: Brenda Rossetti, MD;  Location: [facility];  Service: Orthopedics;  Laterality: Left;    There were no vitals filed for this visit.   Subjective Assessment - 04/16/21 1314    Subjective Pt arriving to therapy reporting increased walking his weekend at [employer]. Pt reporting lateral left ankle pain today when walking down her stairs this morning. Pt reporting 2/10 in right hip and 4/10 in left ankle.    Pertinent History L hip THA  01/2020    Limitations Standing;Walking    How long can you sit comfortably? Can sit for up to an hour.  Sit to stand is improving but difficult.    How long can you stand comfortably? 1 hour    Patient Stated Goals Return to walking for exercise without pain, sleep without interruption    Currently in Pain? Yes    Pain Score 2     Pain Location Hip    Pain Orientation Right    Pain Descriptors / Indicators Sore    Pain Type Chronic pain    Pain Onset More than a month ago    Multiple Pain Sites Yes    Pain Score 4    Pain Location Ankle    Pain Orientation Left    Pain Descriptors / Indicators Sore    Pain Type Acute pain    Pain Onset Yesterday    Pain Frequency Intermittent    Aggravating Factors  stair navigation    Effect of Pain on Daily Activities difficulty walking up and down steps              [facility] PT Assessment - 04/16/21 0001      Observation/Other Assessments   Focus on Therapeutic Outcomes (FOTO)  61 (goal is 75)      Strength   Right Hip Flexion 4+/5  Right Hip ABduction 4+/5    Left Hip Flexion 4+/5    Left Hip ABduction 4+/5      Flexibility   Hamstrings Right: 60 degrees, Left: 56 degrees      Berg Balance Test   Sit to Stand Able to stand without using hands and stabilize independently    Standing Unsupported Able to stand safely 2 minutes    Sitting with Back Unsupported but Feet Supported on Floor or Stool Able to sit safely and securely 2 minutes    Stand to Sit Sits safely with minimal use of hands    Transfers Able to transfer safely, minor use of hands    Standing Unsupported with Eyes Closed Able to stand 10 seconds safely    Standing Unsupported with Feet Together Able to place feet together independently and stand 1 minute safely    From Standing, Reach Forward with Outstretched Arm Can reach confidently >25 cm (10")    From Standing Position, Pick up Object from Floor Able to pick up shoe safely and easily    From Standing Position,  Turn to Look Behind Over each Shoulder Looks behind from both sides and weight shifts well    Turn 360 Degrees Able to turn 360 degrees safely one side only in 4 seconds or less    Standing Unsupported, Alternately Place Feet on Step/Stool Able to complete 4 steps without aid or supervision    Standing Unsupported, One Foot in Front Able to take small step independently and hold 30 seconds    Standing on One Leg Able to lift leg independently and hold equal to or more than 3 seconds    Total Score 49                         [facility] Adult PT Treatment/Exercise - 04/16/21 0001      Knee/Hip Exercises: Stretches   Piriformis Stretch 3 reps;20 seconds    Piriformis Stretch Limitations gluteal stretch pusing knee away in figure 4 position    Other Knee/Hip Stretches ankle ABC's left      Knee/Hip Exercises: Seated   Sit to Sand 2 sets;5 reps;without UE support      Knee/Hip Exercises: Supine   Bridges with [employer] Strengthening;Both;15 reps    Straight Leg Raises Strengthening;Both;15 reps      Knee/Hip Exercises: Sidelying   Hip ABduction Strengthening;Both;2 sets;10 reps;Limitations    Hip ABduction Limitations 1/4 turn to stomach                  PT Education - 04/16/21 1320    Education Details added Ankle ABC's to pt's HEP for now due to increased left ankle pain    Person(s) Educated Patient    Methods Explanation;Demonstration    Comprehension Verbalized understanding;Returned demonstration            PT Short Term Goals - 04/16/21 1351      PT SHORT TERM GOAL #1   Title Patient will be able to independently initiate HEP    Status Achieved      PT SHORT TERM GOAL #2   Title Patient will be able to demonstrate and verbalize appropriate posture    Status Achieved      PT SHORT TERM GOAL #3   Title Patient will demonstrate an increased heel strike during initial contact in order to ambulate without an assistive device    Status Achieved  PT Long Term Goals - 04/16/21 1351      PT LONG TERM GOAL #1   Title Brenda Ortiz will score 75 on her FOTO.    Baseline 61% on 04/16/2021    Status Not Met      PT LONG TERM GOAL #2   Title Improve hip strength to 4+/5 MMT B for hip flexors and abductors.    Baseline grossly 4+/5 in bilateral hips    Status Achieved      PT LONG TERM GOAL #3   Title Galit will return to walking 1 mile or more with her home walking program.    Baseline Pt is consistently walking 1 mile several times each week    Status Achieved      PT LONG TERM GOAL #4   Title Stavroula will report R hip pain consistently 0-2/10 on the Numeric Pain Rating Scale.    Baseline pt reporting on 04/16/2021 that her pain with walking 2.5 miles yesterday was 2/10.    Status Achieved      PT LONG TERM GOAL #5   Title Tiara will be independent with her long-term maintenence HEP at [facility].    Status Achieved                 Plan - 04/16/21 1400    Clinical Impression Statement Pt arriving today reporting walking 2.5 miles yesterday at the [employer]. Pt reporting lateral ankle pain with mild edema note. Pt was instructed in gentle ROM exercises (ABC's for ankle) to help with circulation and movement. BERG balance test performed 49/56. Pt with improvements in bilateral hip strength to grossly 4+/5. Pt with improved FOTO score over the last 3 months to 61%. Pt is still progressing toward amb and functional mobility with no pain but I feet pt is independent in her HEP and can moniter her progress at home. Pt was instructed to follow up with Dr. Ninfa Linden  if her pain began to increase or she felt she wasn't making progress. I am discharging pt from PT services.    Personal Factors and Comorbidities Comorbidity 1    Comorbidities L THA, very weak R hip (B weakness)    Examination-Activity Limitations Transfers;Bed Mobility;Squat;Lift;Locomotion Level;Stairs;Stand    Examination-Participation Restrictions  Interpersonal Relationship;Yard Work;Community Activity    Stability/Clinical Decision Making Stable/Uncomplicated    Rehab Potential Good    PT Frequency 2x / week    PT Duration 4 weeks    PT Treatment/Interventions ADLs/Self Care Home Management;Iontophoresis 17m/ml Dexamethasone;Therapeutic activities;Stair training;Gait training;Therapeutic exercise;Balance training;Neuromuscular re-education;Patient/family education;Dry needling    PT Next Visit Plan Discharge    PT Home Exercise Plan Access Code: [email or web address]   Consulted and Agree with Plan of Care Patient           Patient will benefit from skilled therapeutic intervention in order to improve the following deficits and impairments:  Abnormal gait,Decreased activity tolerance,Decreased balance,Decreased endurance,Decreased range of motion,Decreased strength,Difficulty walking,Pain,Impaired flexibility  Visit Diagnosis: Difficulty walking  Muscle weakness (generalized)  Unsteadiness on feet  Stiffness of left hip, not elsewhere classified  Stiffness of right hip, not elsewhere classified  Pain in right hip  Left hip pain  Weakness of both hips    PHYSICAL THERAPY DISCHARGE SUMMARY  Visits from Start of Care: 29  Current functional level related to goals / functional outcomes: see above  Remaining deficits: See above   Education / Equipment: HEP Plan: Patient agrees to discharge.  Patient goals were partially met. Patient is being  discharged due to meeting the stated rehab goals.  ?????       Problem List Patient Active Problem List   Diagnosis Date Noted  . Status post total replacement of left hip 02/03/2020  . Unilateral primary osteoarthritis, left hip 12/01/2019  . Low back pain 10/27/2019  . Left hip pain 10/27/2019  . Right hip pain 07/14/2017  . Chest pain 06/11/2015  . Hypothyroidism 06/11/2015    Oretha Caprice, PT, MPT 04/16/2021, 2:04 PM  Farley Ly PT, MPT  [facility]  Physical Therapy [address] [place], [place], [address] Phone: [phone]   Fax:  [phone]  Name: Brenda Ortiz MRN: [id number] Date of Birth: [birth year]

## 2021-04-17 DIAGNOSIS — H43813 Vitreous degeneration, bilateral: Secondary | ICD-10-CM | POA: Diagnosis not present

## 2021-04-18 ENCOUNTER — Encounter: Payer: Medicare PPO | Admitting: Rehabilitative and Restorative Service Providers"

## 2021-04-23 ENCOUNTER — Encounter: Payer: Medicare PPO | Admitting: Physical Therapy

## 2021-04-25 ENCOUNTER — Encounter: Payer: Medicare PPO | Admitting: Rehabilitative and Restorative Service Providers"

## 2021-05-24 IMAGING — MR MR HIP*L* W/O CM
4 of 5 series · 22 of 40 positions shown · non-contrast
Comparison: CT 10/25/2019

CLINICAL DATA: Chronic left hip pain

EXAM:
MR OF THE LEFT HIP WITHOUT CONTRAST
TECHNIQUE: Multiplanar, multisequence MR imaging was performed. No intravenous
contrast was administered.

[Series 2: T1 · coronal · 4.0mm · 0.58mm/px · 8 of 34 slices shown]
[im 1/34]
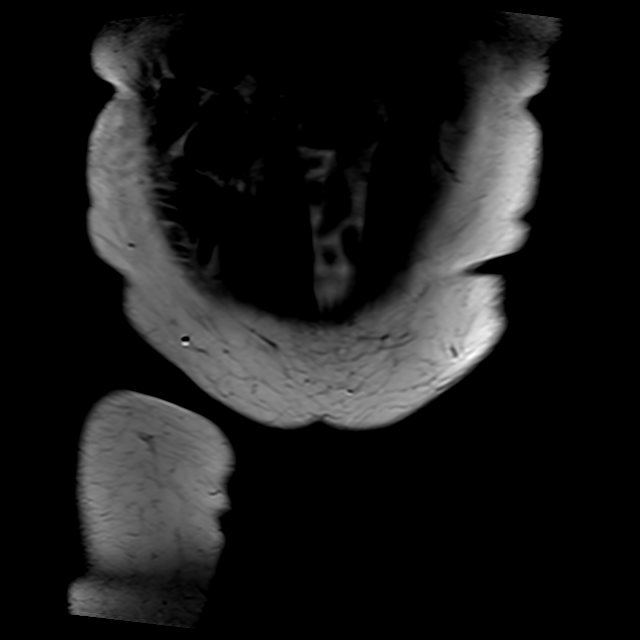
[im 4/34]
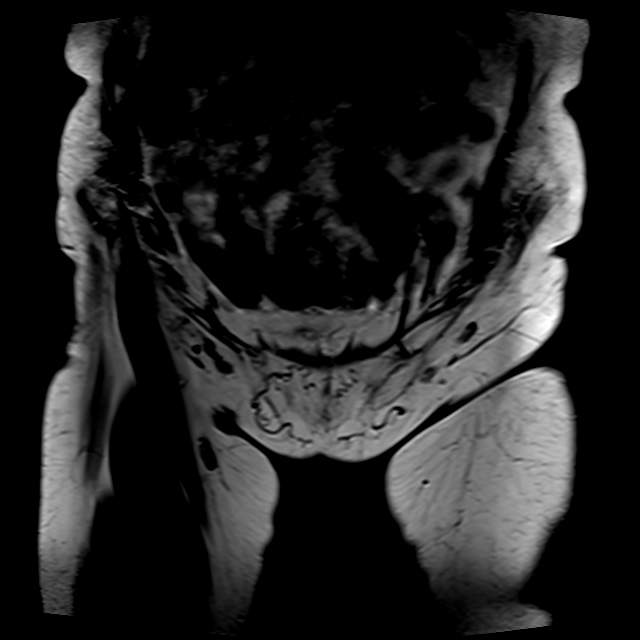
[im 12/34]
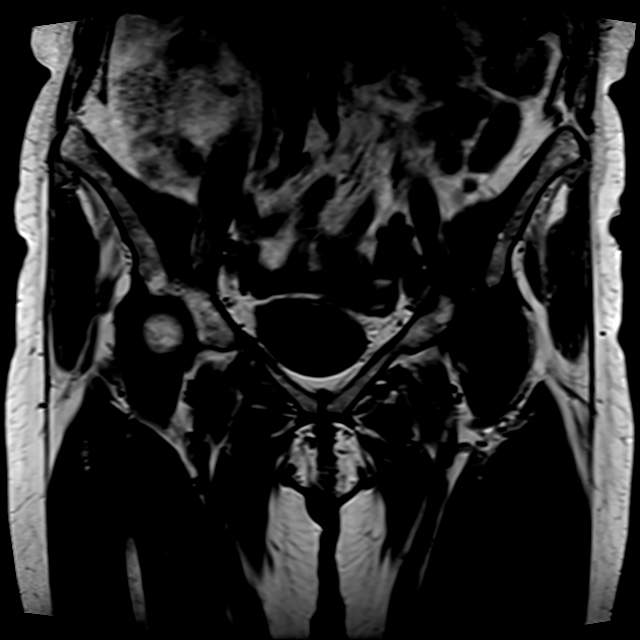
[im 15/34]
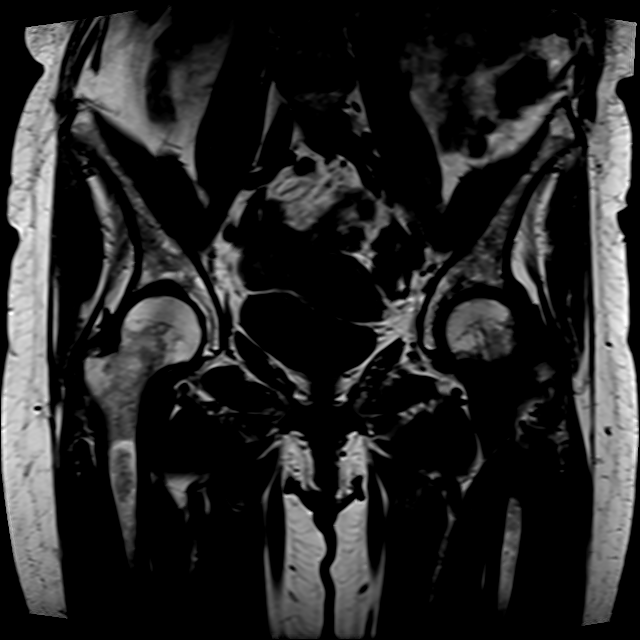
[im 19/34]
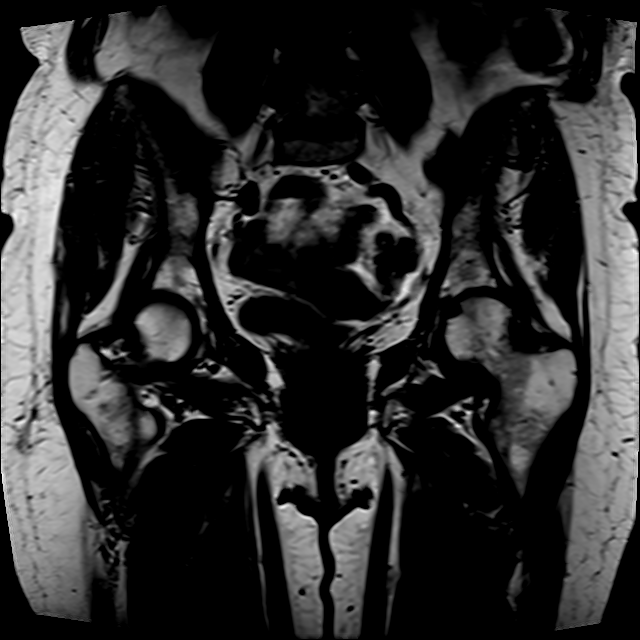
[im 23/34]
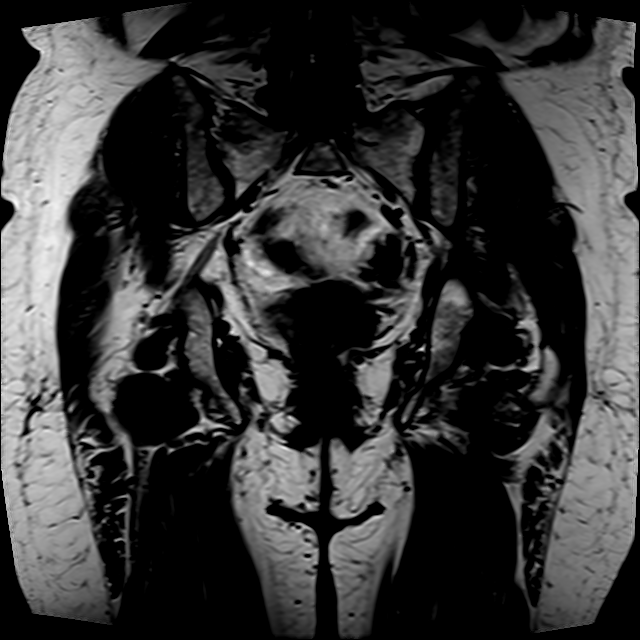
[im 30/34]
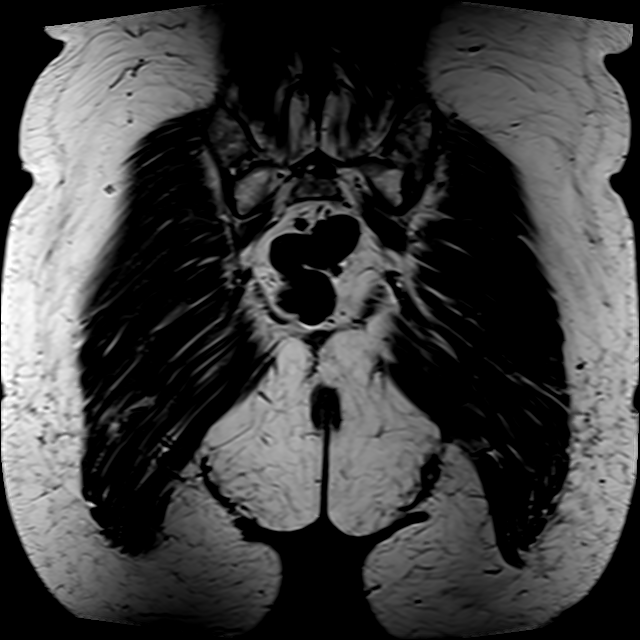
[im 34/34]
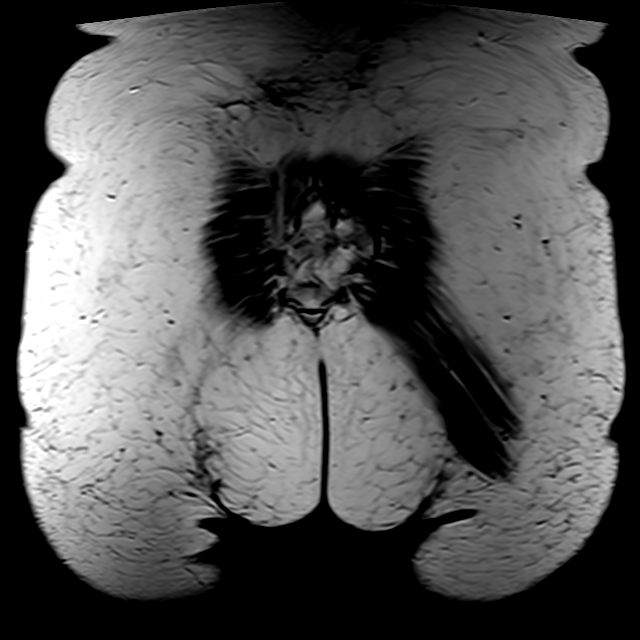

[Series 3: T2 fat-sat · coronal · 4.0mm · 1.16mm/px · 8 of 34 slices shown (1 of 2)]
[im 1/34]
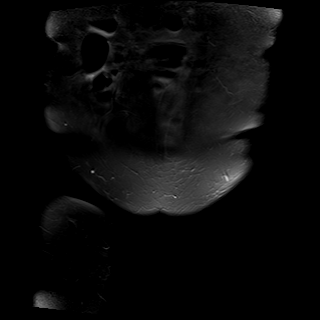
[im 4/34]
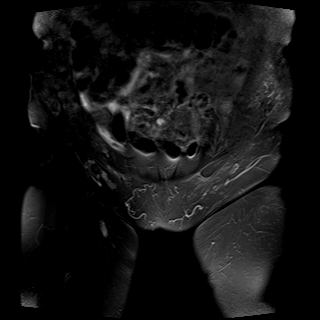
[im 12/34]
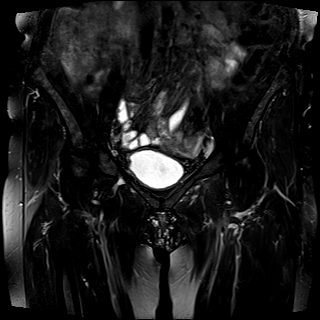
[im 15/34]
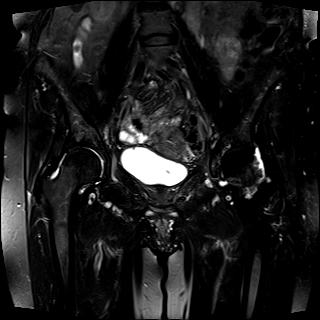
[im 19/34]
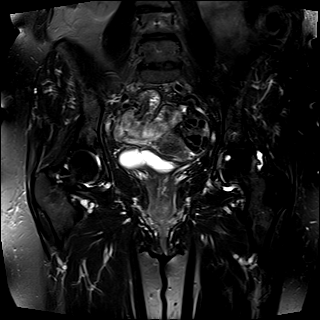
[im 23/34]
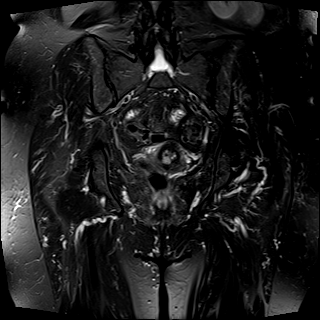
[im 30/34]
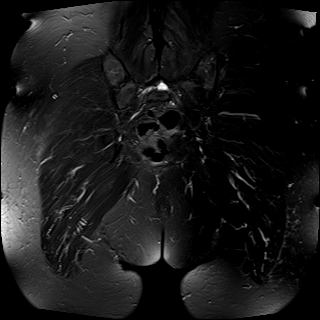
[im 34/34]
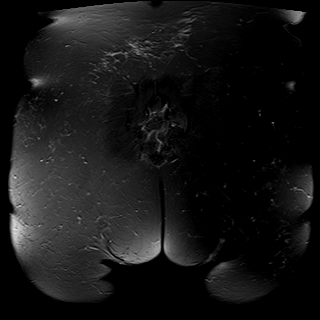

[Series 4: T2 fat-sat · axial · 4.0mm · 0.35mm/px · z∈[-50,+29]mm · 3 of 24 slices shown (2 of 2)]
[im 4/24]
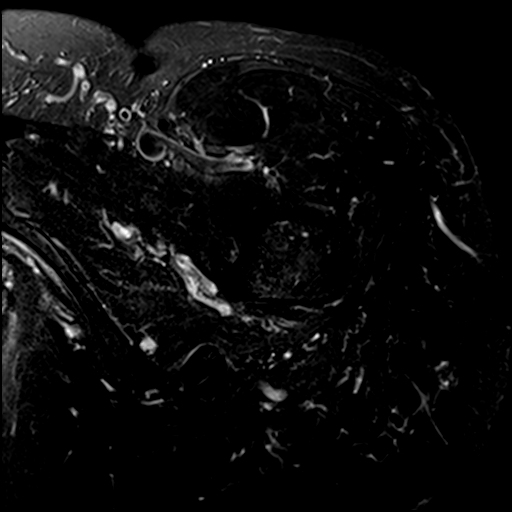
[im 12/24]
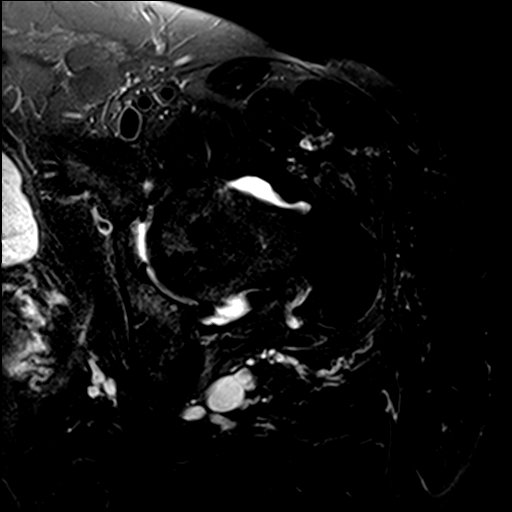
[im 20/24]
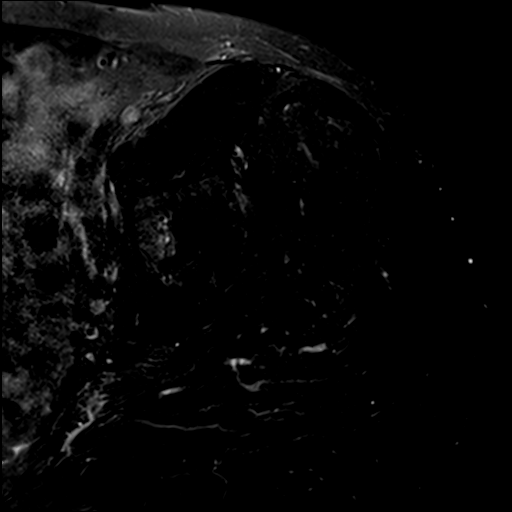

[Series 5: PD fat-sat · sagittal · 4.0mm · 0.70mm/px · 3 of 23 slices shown]
[im 4/23]
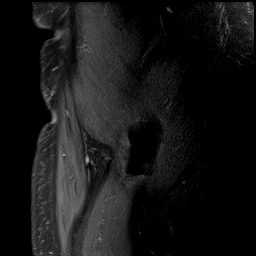
[im 12/23]
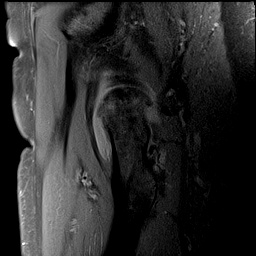
[im 19/23]
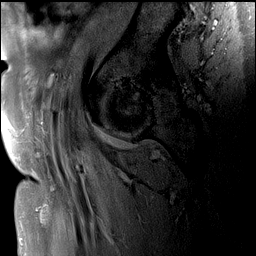

[22 of 40 positions shown; findings below may reference images not displayed]

FINDINGS: Bones: No acute fracture. No dislocation. No femoral head AVN. No
suspicious marrow replacing lesion. Prominent subchondral
degenerative marrow changes at the left hip joint with prominent 6
mm subchondral cyst at the posteromedial humeral head. Smaller
subchondral cystic changes to the opposing acetabulum. Degenerative
changes are also present at the pubic symphysis where there is a
degenerative subchondral cyst in the parasymphyseal aspect of the
right pubic bone.

Articular cartilage and labrum

Articular cartilage: High-grade chondral thinning and surface
irregularity most pronounced along the posterior aspect of the left
hip joint with areas of full-thickness cartilage loss.

Labrum:  Degenerative tearing of the superior labrum.

Joint or bursal effusion

Joint effusion:  Small left hip joint effusion.

Bursae: No abnormal bursal fluid collection.

Muscles and tendons

Muscles and tendons: Bilateral hamstring tendinosis. Left gluteal
tendons are intact. Rectus femoris and iliopsoas tendons are intact.
Preservation of the muscle signal without atrophy or fatty
infiltration.

Other findings

Miscellaneous: No soft tissue fluid collection or hematoma. No left
inguinal lymphadenopathy.
IMPRESSION: 1. Moderate to severe left hip osteoarthritis. Small left hip joint
effusion.
2. Degenerative tearing of the superior labrum.
3. Bilateral hamstring tendinosis.

## 2021-05-30 ENCOUNTER — Other Ambulatory Visit: Payer: Self-pay

## 2021-05-30 ENCOUNTER — Ambulatory Visit: Payer: Medicare PPO | Admitting: Physician Assistant

## 2021-05-30 ENCOUNTER — Encounter: Payer: Self-pay | Admitting: Physician Assistant

## 2021-05-30 DIAGNOSIS — M7061 Trochanteric bursitis, right hip: Secondary | ICD-10-CM

## 2021-05-30 MED ORDER — LIDOCAINE HCL 1 % IJ SOLN
3.0000 mL | INTRAMUSCULAR | Status: AC | PRN
  Administered 2021-05-30: 3 mL

## 2021-05-30 MED ORDER — METHYLPREDNISOLONE ACETATE 40 MG/ML IJ SUSP
40.0000 mg | INTRAMUSCULAR | Status: AC | PRN
  Administered 2021-05-30: 40 mg via INTRA_ARTICULAR

## 2021-05-30 NOTE — Progress Notes (Signed)
   Procedure Note  Patient: Brenda Ortiz             Date of Birth: July 17, 1941           MRN: [id number]             Visit Date: 05/30/2021 HPI: Brenda Ortiz comes in today for right hip pain.  She is asking for a hip injection for trochanteric bursitis.  She has had no new injury to the hip.  She has had injections in the past which have given her good relief.  She is status post left total hip arthroplasty which is doing well.  Review of systems see HPI otherwise negative  Physical exam: Bilateral hips good range of motion without pain.  Tenderness over the right hip trochanteric region.  Right hip lateral aspect of the hip with no rashes skin lesions ulcerations erythema or ecchymosis. Procedures: Visit Diagnoses:  1. Trochanteric bursitis, right hip     Large Joint Inj: R greater trochanter on 05/30/2021 4:40 PM Indications: pain Details: 22 G 1.5 in needle, lateral approach  Arthrogram: No  Medications: 3 mL lidocaine 1 %; 40 mg methylPREDNISolone acetate 40 MG/ML Outcome: tolerated well, no immediate complications Procedure, treatment alternatives, risks and benefits explained, specific risks discussed. Consent was given by the patient. Immediately prior to procedure a time out was called to verify the correct patient, procedure, equipment, support staff and site/side marked as required. Patient was prepped and draped in the usual sterile fashion.    Plan: She will continue to work on IT band stretching.  Follow-up with [place] as needed.  Questions encouraged and answered at length.

## 2021-06-10 ENCOUNTER — Ambulatory Visit (INDEPENDENT_AMBULATORY_CARE_PROVIDER_SITE_OTHER): Payer: Medicare PPO

## 2021-06-10 ENCOUNTER — Ambulatory Visit: Payer: Medicare PPO | Admitting: Orthopaedic Surgery

## 2021-06-10 ENCOUNTER — Telehealth: Payer: Self-pay

## 2021-06-10 DIAGNOSIS — M545 Low back pain, unspecified: Secondary | ICD-10-CM

## 2021-06-10 MED ORDER — METHOCARBAMOL 500 MG PO TABS: 500.0000 mg | ORAL_TABLET | Freq: Four times a day (QID) | ORAL | 1 refills | Status: DC | PRN

## 2021-06-10 MED ORDER — METHYLPREDNISOLONE 4 MG PO TABS: ORAL_TABLET | ORAL | 0 refills | Status: DC

## 2021-06-10 MED ORDER — ACETAMINOPHEN-CODEINE #3 300-30 MG PO TABS: 1.0000 | ORAL_TABLET | Freq: Three times a day (TID) | ORAL | 0 refills | Status: DC | PRN

## 2021-06-10 NOTE — Telephone Encounter (Signed)
error 

## 2021-06-10 NOTE — Progress Notes (Signed)
Office Visit Note   Patient: Brenda Ortiz           Date of Birth: March 12, 1941           MRN: [id number] Visit Date: 06/10/2021              Requested by: Chilton Greathouse, MD [address] [place],  [place] [address] PCP: Chilton Greathouse, MD   Assessment & Plan: Visit Diagnoses:  1. Acute right-sided low back pain without sciatica     Plan: Given her acute pain I am recommending a combination of Robaxin, Tylenol 3 with codeine and a steroid taper.  When she does get back from the beach I would recommend outpatient physical therapy.  If things worsen she will let us know.  All questions and concerns were answered and addressed.  Follow-Up Instructions: Return in about 4 weeks (around 07/08/2021).   Orders:  Orders Placed This Encounter  Procedures   XR Lumbar Spine 2-3 Views   Meds ordered this encounter  Medications   methylPREDNISolone (MEDROL) 4 MG tablet    Sig: Medrol dose pack. Take as instructed    Dispense:  21 tablet    Refill:  0   methocarbamol (ROBAXIN) 500 MG tablet    Sig: Take 1 tablet (500 mg total) by mouth every 6 (six) hours as needed.    Dispense:  40 tablet    Refill:  1   acetaminophen-codeine (TYLENOL #3) 300-30 MG tablet    Sig: Take 1-2 tablets by mouth every 8 (eight) hours as needed.    Dispense:  30 tablet    Refill:  0      Procedures: No procedures performed   Clinical Data: No additional findings.   Subjective: Chief Complaint  Patient presents with   Right Hip - Pain   Lower Back - Pain  The patient is a very pleasant 80 year old female well-known to [place].  We have replaced her left hip.  Recently she had a right hip trochanteric injection.  Yesterday she developed significant right-sided low back pain with no radicular component.  The pain is been bad enough that is making her feel nauseous.  She denies any hip or groin pain or radiating pain down her right leg.  Is been more in her low back to the right side.  It is  definitely causing her enough pain to come in to be seen today.  She does appear uncomfortable.  She is scheduled to leave for the beach this weekend.  She is not a diabetic.  She has not injured her back before.  She denies any change in bowel bladder function.  HPI  Review of Systems She currently denies any headache, chest pain, shortness of breath, fever, chills, nausea, vomiting  Objective: Vital Signs: There were no vitals taken for this visit.  Physical Exam She is alert and orient x3 and in no acute distress Ortho Exam Examination of her right lower back shows pain in the paraspinal muscles.  She has negative straight leg raise to the right side and her hip exam on the right side is normal. Specialty Comments:  No specialty comments available.  Imaging: XR Lumbar Spine 2-3 Views  Result Date: 06/10/2021 2 views of the lumbar spine show significant degenerative scoliosis.  There is no evidence of a compression deformity or acute findings.    PMFS History: Patient Active Problem List   Diagnosis Date Noted   Status post total replacement of left hip 02/03/2020  Unilateral primary osteoarthritis, left hip 12/01/2019   Low back pain 10/27/2019   Right hip pain 07/14/2017   Chest pain 06/11/2015   Hypothyroidism 06/11/2015   Past Medical History:  Diagnosis Date   Arthritis    Hypothyroidism    Incontinence    Seasonal allergies     Family History  Problem Relation Age of Onset   Arthritis Mother    Arthritis Father    Diabetes Father    Diabetes Brother    Diabetes Paternal Grandmother     Past Surgical History:  Procedure Laterality Date   DILATION AND CURETTAGE OF UTERUS     x2   FOOT SURGERY Right    HYSTEROSCOPY WITH D & C N/A 08/14/2016   Procedure: DILATATION AND CURETTAGE /HYSTEROSCOPY;  Surgeon: Olivia Mackie, MD;  Location: [facility];  Service: Gynecology;  Laterality: N/A;   TONSILLECTOMY     TOTAL HIP ARTHROPLASTY Left 02/03/2020   Procedure: LEFT  TOTAL HIP ARTHROPLASTY ANTERIOR APPROACH;  Surgeon: Kathryne Hitch, MD;  Location: [facility];  Service: Orthopedics;  Laterality: Left;   Social History   Occupational History   Not on file  Tobacco Use   Smoking status: Former    Packs/day: 0.50    Years: 10.00    Pack years: 5.00    Types: Cigarettes    Quit date: 11/17/1970    Years since quitting: 50.5   Smokeless tobacco: Never  Vaping Use   Vaping Use: Never used  Substance and Sexual Activity   Alcohol use: Yes    Alcohol/week: 0.0 standard drinks    Comment: 2-3 times a week   Drug use: No   Sexual activity: Not on file

## 2021-06-12 ENCOUNTER — Emergency Department (HOSPITAL_COMMUNITY)
Admission: EM | Admit: 2021-06-12 | Discharge: 2021-06-12 | Disposition: A | Discharge status: Home / Self Care | Payer: Medicare PPO | Attending: Emergency Medicine | Admitting: Emergency Medicine

## 2021-06-12 ENCOUNTER — Emergency Department (HOSPITAL_COMMUNITY): Payer: Medicare PPO

## 2021-06-12 ENCOUNTER — Other Ambulatory Visit: Payer: Self-pay

## 2021-06-12 DIAGNOSIS — Z96642 Presence of left artificial hip joint: Secondary | ICD-10-CM | POA: Diagnosis not present

## 2021-06-12 DIAGNOSIS — Z7982 Long term (current) use of aspirin: Secondary | ICD-10-CM | POA: Insufficient documentation

## 2021-06-12 DIAGNOSIS — I10 Essential (primary) hypertension: Secondary | ICD-10-CM | POA: Diagnosis not present

## 2021-06-12 DIAGNOSIS — M545 Low back pain, unspecified: Secondary | ICD-10-CM

## 2021-06-12 DIAGNOSIS — R109 Unspecified abdominal pain: Secondary | ICD-10-CM | POA: Diagnosis not present

## 2021-06-12 DIAGNOSIS — N3001 Acute cystitis with hematuria: Secondary | ICD-10-CM | POA: Diagnosis not present

## 2021-06-12 DIAGNOSIS — Z20822 Contact with and (suspected) exposure to covid-19: Secondary | ICD-10-CM | POA: Insufficient documentation

## 2021-06-12 DIAGNOSIS — Z79899 Other long term (current) drug therapy: Secondary | ICD-10-CM | POA: Diagnosis not present

## 2021-06-12 DIAGNOSIS — Z87891 Personal history of nicotine dependence: Secondary | ICD-10-CM | POA: Diagnosis not present

## 2021-06-12 DIAGNOSIS — R1031 Right lower quadrant pain: Secondary | ICD-10-CM | POA: Diagnosis not present

## 2021-06-12 DIAGNOSIS — E039 Hypothyroidism, unspecified: Secondary | ICD-10-CM | POA: Insufficient documentation

## 2021-06-12 DIAGNOSIS — G8929 Other chronic pain: Secondary | ICD-10-CM | POA: Diagnosis not present

## 2021-06-12 DIAGNOSIS — R509 Fever, unspecified: Secondary | ICD-10-CM | POA: Diagnosis not present

## 2021-06-12 LAB — CBC WITH DIFFERENTIAL/PLATELET
Abs Immature Granulocytes: 0.05 10*3/uL (ref 0.00–0.07)
Basophils Absolute: 0 10*3/uL (ref 0.0–0.1)
Basophils Relative: 0 %
Eosinophils Absolute: 0 10*3/uL (ref 0.0–0.5)
Eosinophils Relative: 0 %
HCT: 43.9 % (ref 36.0–46.0)
Hemoglobin: 14.7 g/dL (ref 12.0–15.0)
Immature Granulocytes: 1 %
Lymphocytes Relative: 13 %
Lymphs Abs: 1.4 10*3/uL (ref 0.7–4.0)
MCH: 31.9 pg (ref 26.0–34.0)
MCHC: 33.5 g/dL (ref 30.0–36.0)
MCV: 95.2 fL (ref 80.0–100.0)
Monocytes Absolute: 0.5 10*3/uL (ref 0.1–1.0)
Monocytes Relative: 5 %
Neutro Abs: 9 10*3/uL — ABNORMAL HIGH (ref 1.7–7.7)
Neutrophils Relative %: 81 %
Platelets: 238 10*3/uL (ref 150–400)
RBC: 4.61 MIL/uL (ref 3.87–5.11)
RDW: 13.3 % (ref 11.5–15.5)
WBC: 11 10*3/uL — ABNORMAL HIGH (ref 4.0–10.5)
nRBC: 0 % (ref 0.0–0.2)

## 2021-06-12 LAB — URINALYSIS, ROUTINE W REFLEX MICROSCOPIC
Bilirubin Urine: NEGATIVE
Glucose, UA: NEGATIVE mg/dL
Ketones, ur: NEGATIVE mg/dL
Leukocytes,Ua: NEGATIVE
Nitrite: NEGATIVE
Protein, ur: NEGATIVE mg/dL
Specific Gravity, Urine: 1.01 (ref 1.005–1.030)
pH: 5 (ref 5.0–8.0)

## 2021-06-12 LAB — COMPREHENSIVE METABOLIC PANEL
ALT: 15 U/L (ref 0–44)
AST: 17 U/L (ref 15–41)
Albumin: 3.7 g/dL (ref 3.5–5.0)
Alkaline Phosphatase: 38 U/L (ref 38–126)
Anion gap: 8 (ref 5–15)
BUN: 11 mg/dL (ref 8–23)
CO2: 25 mmol/L (ref 22–32)
Calcium: 8.5 mg/dL — ABNORMAL LOW (ref 8.9–10.3)
Chloride: 103 mmol/L (ref 98–111)
Creatinine, Ser: 0.78 mg/dL (ref 0.44–1.00)
GFR, Estimated: >60 mL/min (ref >60)
Glucose, Bld: 126 mg/dL — ABNORMAL HIGH (ref 70–99)
Potassium: 3.7 mmol/L (ref 3.5–5.1)
Sodium: 136 mmol/L (ref 135–145)
Total Bilirubin: 0.4 mg/dL (ref 0.3–1.2)
Total Protein: 6.6 g/dL (ref 6.5–8.1)

## 2021-06-12 LAB — RESP PANEL BY RT-PCR (FLU A&B, COVID) ARPGX2
Influenza A by PCR: NEGATIVE
Influenza B by PCR: NEGATIVE
SARS Coronavirus 2 by RT PCR: NEGATIVE

## 2021-06-12 LAB — URINALYSIS, MICROSCOPIC (REFLEX)

## 2021-06-12 MED ORDER — SODIUM CHLORIDE 0.9 % IV SOLN
1.0000 g | Freq: Once | INTRAVENOUS | Status: AC
  Administered 2021-06-12: 1 g via INTRAVENOUS
  Filled 2021-06-12: qty 10

## 2021-06-12 MED ORDER — OXYCODONE-ACETAMINOPHEN 5-325 MG PO TABS
1.0000 | ORAL_TABLET | Freq: Once | ORAL | Status: AC
  Administered 2021-06-12: 1 via ORAL
  Filled 2021-06-12: qty 1

## 2021-06-12 MED ORDER — ACETAMINOPHEN 325 MG PO TABS
650.0000 mg | ORAL_TABLET | Freq: Once | ORAL | Status: AC
  Administered 2021-06-12: 650 mg via ORAL
  Filled 2021-06-12: qty 2

## 2021-06-12 MED ORDER — FENTANYL CITRATE (PF) 100 MCG/2ML IJ SOLN
40.0000 ug | Freq: Once | INTRAMUSCULAR | Status: AC
  Administered 2021-06-12: 40 ug via INTRAVENOUS
  Filled 2021-06-12: qty 2

## 2021-06-12 MED ORDER — ONDANSETRON 4 MG PO TBDP
4.0000 mg | ORAL_TABLET | Freq: Once | ORAL | Status: AC
  Administered 2021-06-12: 4 mg via ORAL
  Filled 2021-06-12: qty 1

## 2021-06-12 MED ORDER — CEPHALEXIN 500 MG PO CAPS: 500.0000 mg | ORAL_CAPSULE | Freq: Two times a day (BID) | ORAL | 0 refills | Status: AC

## 2021-06-12 MED ORDER — IOHEXOL 300 MG/ML  SOLN
100.0000 mL | Freq: Once | INTRAMUSCULAR | Status: AC | PRN
  Administered 2021-06-12: 100 mL via INTRAVENOUS

## 2021-06-12 NOTE — ED Triage Notes (Signed)
RLQ radiating to back X 3 days. Denies any nausea and vomiting. Pt has seen PCP but pain still persistent.

## 2021-06-12 NOTE — ED Provider Notes (Signed)
[facility] EMERGENCY DEPARTMENT Provider Note   CSN: [id number] Arrival date & time: 06/12/21  0456     History Chief Complaint  Patient presents with   Abdominal Pain    Brenda Ortiz is a 80 y.o. female.  Patient presents with history of hypothyroidism, arthritis, left hip replacement and has had gradually worsening right flank pain.  Initially was more back and muscle spasm and patient was seen by primary doctor and later orthopedics.  Patient was put on muscle spasm medications and steroids with no significant relief.  Yesterday the pain worsened and came around to the front and she thought it might be appendix.  Patient has nausea without vomiting or fevers.  No shortness of breath or chest pain.  Patient is very healthy and active and has been frustrated as she has not been able to do her normal routines.  No injuries.  No urinary symptoms.      Past Medical History:  Diagnosis Date   Arthritis    Hypothyroidism    Incontinence    Seasonal allergies     Patient Active Problem List   Diagnosis Date Noted   Status post total replacement of left hip 02/03/2020   Unilateral primary osteoarthritis, left hip 12/01/2019   Low back pain 10/27/2019   Right hip pain 07/14/2017   Chest pain 06/11/2015   Hypothyroidism 06/11/2015    Past Surgical History:  Procedure Laterality Date   DILATION AND CURETTAGE OF UTERUS     x2   FOOT SURGERY Right    HYSTEROSCOPY WITH D & C N/A 08/14/2016   Procedure: DILATATION AND CURETTAGE /HYSTEROSCOPY;  Surgeon: Olivia Mackie, MD;  Location: [facility];  Service: Gynecology;  Laterality: N/A;   TONSILLECTOMY     TOTAL HIP ARTHROPLASTY Left 02/03/2020   Procedure: LEFT TOTAL HIP ARTHROPLASTY ANTERIOR APPROACH;  Surgeon: Kathryne Hitch, MD;  Location: [facility];  Service: Orthopedics;  Laterality: Left;     OB History   No obstetric history on file.     Family History  Problem Relation Age of Onset    Arthritis Mother    Arthritis Father    Diabetes Father    Diabetes Brother    Diabetes Paternal Grandmother     Social History   Tobacco Use   Smoking status: Former    Packs/day: 0.50    Years: 10.00    Pack years: 5.00    Types: Cigarettes    Quit date: 11/17/1970    Years since quitting: 50.6   Smokeless tobacco: Never  Vaping Use   Vaping Use: Never used  Substance Use Topics   Alcohol use: Yes    Alcohol/week: 0.0 standard drinks    Comment: 2-3 times a week   Drug use: No    Home Medications Prior to Admission medications   Medication Sig Start Date End Date Taking? Authorizing Provider  acetaminophen (TYLENOL) 500 MG tablet Take 500 mg by mouth every 6 (six) hours as needed (for pain.).   Yes [provider]  acetaminophen-codeine (TYLENOL #3) 300-30 MG tablet Take 1-2 tablets by mouth every 8 (eight) hours as needed. 06/10/21  Yes Kathryne Hitch, MD  calcium-vitamin D (OSCAL) 250-125 MG-UNIT per tablet Take 1 tablet by mouth at bedtime.    Yes [provider]  fexofenadine (ALLEGRA) 180 MG tablet Take 180 mg by mouth daily as needed for allergies or rhinitis.   Yes [provider]  methocarbamol (ROBAXIN) 500 MG tablet Take  1 tablet (500 mg total) by mouth every 6 (six) hours as needed. 06/10/21  Yes Kathryne Hitch, MD  methylPREDNISolone (MEDROL) 4 MG tablet Medrol dose pack. Take as instructed Patient taking differently: Take 4 mg by mouth. Medrol dose pack. Take as instructed. Started dosepack on 06/10/21 06/10/21  Yes Kathryne Hitch, MD  Multiple Vitamin (MULTIVITAMIN WITH MINERALS) TABS tablet Take 1 tablet by mouth at bedtime.    Yes [provider]  MYRBETRIQ 50 MG TB24 tablet  09/20/20  Yes [provider]  naproxen sodium (ALEVE) 220 MG tablet Take 220 mg by mouth daily as needed (pain.).   Yes [provider]  PREMPRO 0.625-2.5 MG per tablet Take 1 tablet by mouth at bedtime.  08/28/13   Yes [provider]  SYNTHROID 75 MCG tablet Take 75 mcg by mouth daily before breakfast.  09/12/13  Yes [provider]  aspirin 81 MG chewable tablet Chew 1 tablet (81 mg total) by mouth 2 (two) times daily. 02/04/20   Kathryne Hitch, MD    Allergies    Patient has no known allergies.  Review of Systems   Review of Systems  Constitutional:  Negative for chills and fever.  HENT:  Negative for congestion.   Eyes:  Negative for visual disturbance.  Respiratory:  Negative for shortness of breath.   Cardiovascular:  Negative for chest pain.  Gastrointestinal:  Positive for abdominal pain. Negative for vomiting.  Genitourinary:  Positive for flank pain. Negative for dysuria.  Musculoskeletal:  Positive for back pain and gait problem. Negative for neck pain and neck stiffness.  Skin:  Negative for rash.  Neurological:  Negative for light-headedness and headaches.   Physical Exam Updated Vital Signs BP (!) 169/74   Pulse (!) 54   Temp 98.5 F (36.9 C) (Oral)   Resp 13   Ht 5\' 7"  (1.702 m)   Wt 69.9 kg   SpO2 99%   BMI 24.14 kg/m   Physical Exam Vitals and nursing note reviewed.  Constitutional:      General: She is not in acute distress.    Appearance: She is well-developed.  HENT:     Head: Normocephalic and atraumatic.     Mouth/Throat:     Mouth: Mucous membranes are moist.  Eyes:     General:        Right eye: No discharge.        Left eye: No discharge.     Conjunctiva/sclera: Conjunctivae normal.  Neck:     Trachea: No tracheal deviation.  Cardiovascular:     Rate and Rhythm: Normal rate and regular rhythm.     Heart sounds: No murmur heard. Pulmonary:     Effort: Pulmonary effort is normal.     Breath sounds: Normal breath sounds.  Abdominal:     General: There is no distension.     Palpations: Abdomen is soft.     Tenderness: There is abdominal tenderness in the right lower quadrant. There is no guarding.  Musculoskeletal:      Cervical back: Normal range of motion and neck supple. No rigidity.     Comments: Patient has no pain with flexion of hip or knee on the right, focal tenderness right lateral inguinal ligament region without signs of infection mild tenderness to right lower flank and iliac crest without rash.  Skin:    General: Skin is warm.     Capillary Refill: Capillary refill takes less than 2 seconds.  Findings: No rash.  Neurological:     General: No focal deficit present.     Mental Status: She is alert.     Cranial Nerves: No cranial nerve deficit.  Psychiatric:        Mood and Affect: Mood normal.    ED Results / Procedures / Treatments   Labs (all labs ordered are listed, but only abnormal results are displayed) Labs Reviewed  CBC WITH DIFFERENTIAL/PLATELET - Abnormal; Notable for the following components:      Result Value   WBC 11.0 (*)    Neutro Abs 9.0 (*)    All other components within normal limits  COMPREHENSIVE METABOLIC PANEL - Abnormal; Notable for the following components:   Glucose, Bld 126 (*)    Calcium 8.5 (*)    All other components within normal limits  URINALYSIS, ROUTINE W REFLEX MICROSCOPIC - Abnormal; Notable for the following components:   APPearance CLOUDY (*)    Hgb urine dipstick TRACE (*)    All other components within normal limits  URINALYSIS, MICROSCOPIC (REFLEX) - Abnormal; Notable for the following components:   Bacteria, UA MANY (*)    All other components within normal limits  RESP PANEL BY RT-PCR (FLU A&B, COVID) [id number]    EKG None  Radiology CT ABDOMEN PELVIS W CONTRAST  Result Date: 06/12/2021 CLINICAL DATA:  Right lower quadrant abdominal pain. EXAM: CT ABDOMEN AND PELVIS WITH CONTRAST TECHNIQUE: Multidetector CT imaging of the abdomen and pelvis was performed using the standard protocol following bolus administration of intravenous contrast. CONTRAST:  100mL OMNIPAQUE IOHEXOL 300 MG/ML  SOLN COMPARISON:  10/25/2019 FINDINGS: Lower chest:  The lung bases are clear of acute process. No pleural effusion or pulmonary lesions. The heart is normal in size. No pericardial effusion. The distal esophagus and aorta are unremarkable. Hepatobiliary: No hepatic lesions or intrahepatic biliary dilatation. The gallbladder is unremarkable. No common bile duct dilatation. Pancreas: No mass, inflammation or ductal dilatation. Spleen: Normal size. No focal lesions. Adrenals/Urinary Tract: The adrenal glands and kidneys are unremarkable. No worrisome renal lesions or hydronephrosis. Small renal cysts are noted. The bladder is unremarkable. Stomach/Bowel: The stomach, duodenum, small bowel and colon are unremarkable. No acute inflammatory changes, mass lesions or obstructive findings. There is a high transverse cecum which is in the right upper quadrant instead of the right lower quadrant. The terminal ileum is unremarkable. The appendix is in the right upper quadrant just below the gallbladder and near the right kidney. No findings for acute appendicitis. Moderate sigmoid colon diverticulosis but no findings for acute diverticulitis. Vascular/Lymphatic: Scattered vascular calcifications but no aneurysm or dissection. The branch vessels are patent. The major venous structures are patent. Retroaortic left renal vein is noted. Reproductive: The uterus is unremarkable. A few small fibroids are noted. The ovaries appear normal. Other: No pelvic mass or adenopathy. No free pelvic fluid collections. No inguinal mass or adenopathy. No abdominal wall hernia or subcutaneous lesions. Musculoskeletal: Significant scoliosis but no acute bony findings. A left hip prosthesis is noted. IMPRESSION: 1. No acute abdominal/pelvic findings, mass lesions or adenopathy. 2. High transverse cecum which is in the right upper quadrant instead of the right lower quadrant. The appendix is in the right upper quadrant but no findings for acute appendicitis. 3. No findings to account for the patient's  right lower quadrant abdominal pain. 4. Sigmoid colon diverticulosis without findings for acute diverticulitis. Electronically Signed   By: Rudie MeyerP.  Gallerani M.D.   On: 06/12/2021 07:36   XR Lumbar  Spine 2-3 Views  Result Date: 06/10/2021 2 views of the lumbar spine show significant degenerative scoliosis.  There is no evidence of a compression deformity or acute findings.   Procedures Procedures   Medications Ordered in ED Medications  acetaminophen (TYLENOL) tablet 650 mg (has no administration in time range)  ondansetron (ZOFRAN-ODT) disintegrating tablet 4 mg (4 mg Oral Given 06/12/21 0511)  oxyCODONE-acetaminophen (PERCOCET/ROXICET) 5-325 MG per tablet 1 tablet (1 tablet Oral Given 06/12/21 0511)  iohexol (OMNIPAQUE) 300 MG/ML solution 100 mL (100 mLs Intravenous Contrast Given 06/12/21 0708)    ED Course  I have reviewed the triage vital signs and the nursing notes.  Pertinent labs & imaging results that were available during my care of the patient were reviewed by me and considered in my medical decision making (see chart for details).    MDM Rules/Calculators/A&P                           Patient presents with right lower quadrant abdominal and flank pain differential including musculoskeletal, appendicitis, lymph node related, urinary related, other.  CT scan ordered and reviewed and discussed results with patient showing appendix in the right upper quadrant but not inflamed, diverticulosis without diverticulitis.  Pain improved after medicines and waiting to be seen.  Blood work reassuring normal electrolytes, kidney function reassuring, mild leukocytosis 11,000.  Urinalysis showed bacteria, mild hemoglobin and signs of mild infection.  Rocephin given due to concern for early pyelonephritis.  Patient has close outpatient follow-up.  Had episode of worsening pain, fentanyl given.  Oral fluids given.  Discussed follow-up thrive high blood pressure likely pain related.  Patient will  need close outpatient follow-up with orthopedics and primary doctor.  Prescription for oral antibiotics given discussed following up culture and if no improvement in 3 days she will need further work-up. Final Clinical Impression(s) / ED Diagnoses Final diagnoses:  Acute right flank pain    Rx / DC Orders ED Discharge Orders     None        Blane Ohara, MD 06/12/21 1234

## 2021-06-12 NOTE — ED Provider Notes (Signed)
Emergency Medicine Provider Triage Evaluation Note  Brenda Ortiz , a 80 y.o. female  was evaluated in triage.  Pt complains of abdominal pain.  Patient developed pain in the right flank and right low back 3 days ago that has now moved into the right lower abdomen.  She initially thought that the pain was related to her hip since she had a hip replacement last year.  So she went to her orthopedist office and they did an x-ray and it was unremarkable. She continues to have bilateral low back pain and muscle spasms.  She reports associated nausea, but no vomiting.  Pain is worse with walking and she has been laying in bed for the last 2 days due to her symptoms.  Pain is improved with leaning forward.  No other known aggravating or alleviating factors.  She was given a dose of hydrocodone and has been taking OTC analgesia with no improvement in her symptoms.  No fever, diarrhea, dysuria, hematuria, chest pain, shortness of breath, dizziness, lightheadedness, numbness, weakness, recent falls or injuries.  Review of Systems  Positive: Flank pain, back pain, abdominal pain, nausea Negative: Vomiting, fever, chills, chest pain, shortness of breath, dizziness, lightheadedness, numbness, weakness, rash  Physical Exam  BP (!) 186/79 (BP Location: Left Arm)   Pulse 67   Temp 98.5 F (36.9 C) (Oral)   Resp 16   Ht 5\' 7"  (1.702 m)   Wt 69.9 kg   SpO2 98%   BMI 24.14 kg/m  Gen:   Awake, no distress   Resp:  Normal effort  MSK:   Moves extremities without difficulty  Other:  Abdominal exam is limited triage as patient is sitting upright in a chair due to limited staff and having difficulty walking from her pain.  She is tender to palpation of the right lower quadrant.  Abdomen is soft and nondistended.  No CVA tenderness bilaterally.  Spine is nontender.  Medical Decision Making  Medically screening exam initiated at 5:34 AM.  Appropriate orders placed.  was informed that the  remainder of the evaluation will be completed by another provider, this initial triage assessment does not replace that evaluation, and the importance of remaining in the ED until their evaluation is complete.  Labs, imaging ordered.  She has been given the medication and just Zofran in triage.  She will require further work-up and evaluation in the emergency department.   [name] A, PA-C 06/12/21 0534    06/14/21, MD 06/12/21 [phone]

## 2021-06-12 NOTE — Discharge Instructions (Addendum)
Take antibiotics as prescribed starting tomorrow. Use pain meds as needed that were previously prescribed. Use ice as needed.  Discussed further treatment and management options with primary doctor and orthopedics which may include MRI, physical therapy, other.  Return for new or worsening symptoms.

## 2021-06-14 ENCOUNTER — Other Ambulatory Visit: Payer: Self-pay | Admitting: Orthopaedic Surgery

## 2021-06-14 ENCOUNTER — Telehealth: Payer: Self-pay | Admitting: Orthopaedic Surgery

## 2021-06-14 LAB — URINE CULTURE: Culture: >=100000 — AB

## 2021-06-14 MED ORDER — ACETAMINOPHEN-CODEINE #3 300-30 MG PO TABS: 1.0000 | ORAL_TABLET | Freq: Three times a day (TID) | ORAL | 0 refills | Status: DC | PRN

## 2021-06-14 NOTE — Telephone Encounter (Signed)
Please advise 

## 2021-06-14 NOTE — Telephone Encounter (Signed)
Pt states she has an UTI and also the pain from her hip. She is wondering if she could get a refill on Tylenol #3.   CB [phone]

## 2021-06-14 NOTE — Telephone Encounter (Signed)
Lvm informing pt.

## 2021-06-15 ENCOUNTER — Telehealth: Payer: Self-pay | Admitting: Emergency Medicine

## 2021-06-15 NOTE — Progress Notes (Signed)
ED Antimicrobial Stewardship Positive Culture Follow Up   Brenda Ortiz is an 80 y.o. female who presented to [facility] on 06/12/2021 with a chief complaint of  Chief Complaint  Patient presents with   Abdominal Pain    Recent Results (from the past 720 hour(s))  Resp Panel by RT-PCR (Flu A&B, Covid) Nasopharyngeal Swab     Status: None   Collection Time: 06/12/21  8:07 AM   Specimen: Nasopharyngeal Swab; Nasopharyngeal(NP) swabs in vial transport medium  Result Value Ref Range Status   SARS Coronavirus 2 by RT PCR NEGATIVE NEGATIVE Final    Comment: (NOTE) SARS-CoV-2 target nucleic acids are NOT DETECTED.  The SARS-CoV-2 RNA is generally detectable in upper respiratory specimens during the acute phase of infection. The lowest concentration of SARS-CoV-2 viral copies this assay can detect is 138 copies/mL. A negative result does not preclude SARS-Cov-2 infection and should not be used as the sole basis for treatment or other patient management decisions. A negative result may occur with  improper specimen collection/handling, submission of specimen other than nasopharyngeal swab, presence of viral mutation(s) within the areas targeted by this assay, and inadequate number of viral copies(<138 copies/mL). A negative result must be combined with clinical observations, patient history, and epidemiological information. The expected result is Negative.  Fact Sheet for Patients:  BloggerCourse.com  Fact Sheet for Healthcare Providers:  SeriousBroker.it  This test is no t yet approved or cleared by the [place] FDA and  has been authorized for detection and/or diagnosis of SARS-CoV-2 by FDA under an Emergency Use Authorization (EUA). This EUA will remain  in effect (meaning this test can be used) for the duration of the COVID-19 declaration under Section 564(b)(1) of the Act, 21 U.S.C.section 360bbb-3(b)(1), unless the  authorization is terminated  or revoked sooner.       Influenza A by PCR NEGATIVE NEGATIVE Final   Influenza B by PCR NEGATIVE NEGATIVE Final    Comment: (NOTE) The Xpert Xpress SARS-CoV-2/FLU/RSV plus assay is intended as an aid in the diagnosis of influenza from Nasopharyngeal swab specimens and should not be used as a sole basis for treatment. Nasal washings and aspirates are unacceptable for Xpert Xpress SARS-CoV-2/FLU/RSV testing.  Fact Sheet for Patients: BloggerCourse.com  Fact Sheet for Healthcare Providers: SeriousBroker.it  This test is not yet approved or cleared by the [place] FDA and has been authorized for detection and/or diagnosis of SARS-CoV-2 by FDA under an Emergency Use Authorization (EUA). This EUA will remain in effect (meaning this test can be used) for the duration of the COVID-19 declaration under Section 564(b)(1) of the Act, 21 U.S.C. section 360bbb-3(b)(1), unless the authorization is terminated or revoked.  Performed at [facility], [address] [place], [place] [address]   Urine Culture     Status: Abnormal   Collection Time: 06/12/21 11:23 AM   Specimen: Urine, Clean Catch  Result Value Ref Range Status   Specimen Description URINE, CLEAN CATCH  Final   Special Requests   Final    NONE Performed at [facility], [address], [place], [place] [address]    Culture >=100,000 COLONIES/mL ENTEROCOCCUS FAECALIS (A)  Final   Report Status 06/14/2021 FINAL  Final   Organism ID, Bacteria ENTEROCOCCUS FAECALIS (A)  Final      Susceptibility   Enterococcus faecalis - MIC*    AMPICILLIN <=2 SENSITIVE Sensitive     NITROFURANTOIN <=16 SENSITIVE Sensitive     VANCOMYCIN 2 SENSITIVE  Sensitive     * >=100,000 COLONIES/mL ENTEROCOCCUS FAECALIS    [x]  Treated with cephalexin, organism resistant to prescribed antimicrobial []  Patient discharged originally without antimicrobial  agent and treatment is now indicated  New antibiotic prescription: Augmentin 875 mg tablet twice daily for 10 days (Qty 20; Refills 0)  ED Provider: , PharmD, BCPS 06/15/2021 11:56 AM ED Clinical Pharmacist -  [phone]

## 2021-06-15 NOTE — Telephone Encounter (Signed)
Post ED Visit - Positive Culture Follow-up: Successful Patient Follow-Up  Culture assessed and recommendations reviewed by:  []  , Pharm.D. []  Enzo Bi, Pharm.D., BCPS AQ-ID []  , Pharm.D., BCPS []  Celedonio Miyamoto, .D., BCPS []  [place], .D., BCPS, AAHIVP []  Georgina Pillion, Pharm.D., BCPS, AAHIVP []  [address], PharmD, BCPS []  , PharmD, BCPS []  [place], PharmD, BCPS []  [address], PharmD  Positive urine culture  []  Patient discharged without antimicrobial prescription and treatment is now indicated [x]  Organism is resistant to prescribed ED discharge antimicrobial []  Patient with positive blood cultures  Changes discussed with ED provider: MD New antibiotic prescription stop cephalexin, start Augmentin 875mg  po bid x 10 days   Attempting to contact patient   Brenda Ortiz 06/15/2021, 1:08 PM

## 2021-06-17 DIAGNOSIS — N390 Urinary tract infection, site not specified: Secondary | ICD-10-CM

## 2021-06-17 HISTORY — DX: Urinary tract infection, site not specified: N39.0

## 2021-06-24 ENCOUNTER — Other Ambulatory Visit (HOSPITAL_COMMUNITY): Payer: Self-pay | Admitting: Internal Medicine

## 2021-06-24 ENCOUNTER — Other Ambulatory Visit: Payer: Self-pay | Admitting: Internal Medicine

## 2021-06-24 ENCOUNTER — Telehealth: Payer: Self-pay | Admitting: Emergency Medicine

## 2021-06-24 DIAGNOSIS — N390 Urinary tract infection, site not specified: Secondary | ICD-10-CM | POA: Diagnosis not present

## 2021-06-24 DIAGNOSIS — M419 Scoliosis, unspecified: Secondary | ICD-10-CM | POA: Diagnosis not present

## 2021-06-24 DIAGNOSIS — N3001 Acute cystitis with hematuria: Secondary | ICD-10-CM | POA: Diagnosis not present

## 2021-06-24 DIAGNOSIS — M545 Low back pain, unspecified: Secondary | ICD-10-CM | POA: Diagnosis not present

## 2021-06-24 DIAGNOSIS — M25551 Pain in right hip: Secondary | ICD-10-CM | POA: Diagnosis not present

## 2021-06-25 ENCOUNTER — Ambulatory Visit (HOSPITAL_COMMUNITY)
Admission: RE | Admit: 2021-06-25 | Discharge: 2021-06-25 | Disposition: A | Discharge status: Home / Self Care | Payer: Medicare PPO | Source: Ambulatory Visit | Attending: Internal Medicine | Admitting: Internal Medicine

## 2021-06-25 ENCOUNTER — Other Ambulatory Visit: Payer: Self-pay

## 2021-06-25 DIAGNOSIS — M545 Low back pain, unspecified: Secondary | ICD-10-CM | POA: Diagnosis not present

## 2021-06-25 DIAGNOSIS — M5126 Other intervertebral disc displacement, lumbar region: Secondary | ICD-10-CM | POA: Diagnosis not present

## 2021-06-25 DIAGNOSIS — M419 Scoliosis, unspecified: Secondary | ICD-10-CM | POA: Insufficient documentation

## 2021-06-25 DIAGNOSIS — M5125 Other intervertebral disc displacement, thoracolumbar region: Secondary | ICD-10-CM | POA: Diagnosis not present

## 2021-06-25 DIAGNOSIS — M4699 Unspecified inflammatory spondylopathy, multiple sites in spine: Secondary | ICD-10-CM | POA: Diagnosis not present

## 2021-06-25 DIAGNOSIS — M5127 Other intervertebral disc displacement, lumbosacral region: Secondary | ICD-10-CM | POA: Diagnosis not present

## 2021-06-25 MED ORDER — GADOBUTROL 1 MMOL/ML IV SOLN
7.0000 mL | Freq: Once | INTRAVENOUS | Status: AC | PRN
  Administered 2021-06-25: 7 mL via INTRAVENOUS

## 2021-06-27 DIAGNOSIS — M5414 Radiculopathy, thoracic region: Secondary | ICD-10-CM | POA: Diagnosis not present

## 2021-06-28 ENCOUNTER — Other Ambulatory Visit: Payer: Self-pay | Admitting: Neurological Surgery

## 2021-06-28 DIAGNOSIS — M5414 Radiculopathy, thoracic region: Secondary | ICD-10-CM

## 2021-07-04 ENCOUNTER — Ambulatory Visit
Admission: RE | Admit: 2021-07-04 | Discharge: 2021-07-04 | Disposition: A | Discharge status: Home / Self Care | Payer: Medicare PPO | Source: Ambulatory Visit | Attending: Neurological Surgery | Admitting: Neurological Surgery

## 2021-07-04 ENCOUNTER — Other Ambulatory Visit: Payer: Self-pay

## 2021-07-04 DIAGNOSIS — M5414 Radiculopathy, thoracic region: Secondary | ICD-10-CM

## 2021-07-04 DIAGNOSIS — M5114 Intervertebral disc disorders with radiculopathy, thoracic region: Secondary | ICD-10-CM | POA: Diagnosis not present

## 2021-07-04 MED ORDER — TRIAMCINOLONE ACETONIDE 40 MG/ML IJ SUSP (RADIOLOGY)
60.0000 mg | Freq: Once | INTRAMUSCULAR | Status: AC
  Administered 2021-07-04: 60 mg via EPIDURAL

## 2021-07-04 MED ORDER — IOPAMIDOL (ISOVUE-M 200) INJECTION 41%
1.0000 mL | Freq: Once | INTRAMUSCULAR | Status: AC
  Administered 2021-07-04: 1 mL via EPIDURAL

## 2021-07-04 NOTE — Discharge Instructions (Signed)

## 2021-07-08 ENCOUNTER — Encounter: Payer: Self-pay | Admitting: Orthopaedic Surgery

## 2021-07-08 ENCOUNTER — Other Ambulatory Visit: Payer: Self-pay

## 2021-07-08 ENCOUNTER — Ambulatory Visit: Payer: Medicare PPO | Admitting: Orthopaedic Surgery

## 2021-07-08 DIAGNOSIS — M545 Low back pain, unspecified: Secondary | ICD-10-CM

## 2021-07-08 MED ORDER — ACETAMINOPHEN-CODEINE #3 300-30 MG PO TABS: 1.0000 | ORAL_TABLET | Freq: Three times a day (TID) | ORAL | 0 refills | Status: DC | PRN

## 2021-07-08 NOTE — Progress Notes (Signed)
The patient comes in today as a relates to her right-sided low back pain and side pain.  She did see one of the neurosurgeons in town after her primary care physician appropriately obtained an MRI of her lumbar spine.  There was some significant right-sided stenosis and asymptomatic disc as well.  This past Thursday she did have an epidural steroid injection of the right side.  She says that is finally helped temporize her symptoms.  She is requesting some Tylenol 3 today and I think this is reasonable.  She is an active 80 year old female.  We tried to set her up for physical therapy before but she was in so much pain she could not tolerate even going so she did not make an appointment.  She is now interested in trying physical therapy to see if this will augment the injection and help her feel better.  I did tell her its only been 4 days since she had that injection so it is a good sign that she is feeling a little bit better but she needs to give this more time.  Her right hip moves smoothly and fluidly.  All of her pain is proximal to the hip joint and in the low back to the right side.  I will send in some Tylenol 3 for her.  We will once again set her up for outpatient physical therapy for any modalities to try to decrease her right side and low back pain.  I will see her back in 4 weeks.  All questions and concerns were answered and addressed.

## 2021-07-09 ENCOUNTER — Other Ambulatory Visit: Payer: Self-pay

## 2021-07-09 DIAGNOSIS — M545 Low back pain, unspecified: Secondary | ICD-10-CM

## 2021-07-16 ENCOUNTER — Other Ambulatory Visit: Payer: Self-pay

## 2021-07-16 ENCOUNTER — Encounter: Payer: Self-pay | Admitting: Physical Therapy

## 2021-07-16 ENCOUNTER — Ambulatory Visit: Payer: Medicare PPO | Admitting: Physical Therapy

## 2021-07-16 DIAGNOSIS — M6281 Muscle weakness (generalized): Secondary | ICD-10-CM | POA: Diagnosis not present

## 2021-07-16 DIAGNOSIS — R262 Difficulty in walking, not elsewhere classified: Secondary | ICD-10-CM | POA: Diagnosis not present

## 2021-07-16 DIAGNOSIS — R2681 Unsteadiness on feet: Secondary | ICD-10-CM | POA: Diagnosis not present

## 2021-07-16 DIAGNOSIS — M5441 Lumbago with sciatica, right side: Secondary | ICD-10-CM

## 2021-07-16 NOTE — Therapy (Addendum)
[facility] [address] [employer], [place], [address] Phone: [phone]   Fax:  [phone]  Physical Therapy Evaluation /Discharge  Patient Details  Name: Brenda Ortiz MRN: [id number] Date of Birth: 07/11/1941 Referring Provider (PT): Dr. Christopher Blackman MD   Encounter Date: 07/16/2021   PT End of Session - 07/16/21 1542     Visit Number 1    Number of Visits 8    Date for PT Re-Evaluation 08/16/21    Progress Note Due on Visit 10    PT Start Time 1528    PT Stop Time 1600    PT Time Calculation (min) 32 min    Activity Tolerance Patient tolerated treatment well    Behavior During Therapy WFL for tasks assessed/performed             Past Medical History:  Diagnosis Date   Arthritis    Hypothyroidism    Incontinence    Seasonal allergies     Past Surgical History:  Procedure Laterality Date   DILATION AND CURETTAGE OF UTERUS     x2   FOOT SURGERY Right    HYSTEROSCOPY WITH D & C N/A 08/14/2016   Procedure: DILATATION AND CURETTAGE /HYSTEROSCOPY;  Surgeon: Richard Taavon, MD;  Location: [facility];  Service: Gynecology;  Laterality: N/A;   TONSILLECTOMY     TOTAL HIP ARTHROPLASTY Left 02/03/2020   Procedure: LEFT TOTAL HIP ARTHROPLASTY ANTERIOR APPROACH;  Surgeon: Blackman, Christopher Y, MD;  Location: [facility];  Service: Orthopedics;  Laterality: Left;    There were no vitals filed for this visit.    Subjective Assessment - 07/16/21 1528     Subjective Pt stating new onset of low back pain around end of July  when she was out walking. Pt reporting increased back pain with unknow cause. Pt dx with UTI and then her pain continued to get worse after her UTI was treated. Pt reporting 6/10 in her right sided low back. Pt reporting her pain can increase to 10/10 at times. Pt stating she has been more sedentary due to increased pain levels. Pt is scheduled to follow up for spinal injection on 08/01/2021. Pt stating new dx fo  herniated disc.    Pertinent History arthritis, hypothyroidism, foot surgery on right, Left THA 2021    Diagnostic tests MRI    Patient Stated Goals Stop hurting, walk without pain    Currently in Pain? Yes    Pain Score 6     Pain Location Back    Pain Orientation Right    Pain Type Acute pain    Pain Radiating Towards down right LE into anterior thigh/hip    Pain Onset More than a month ago    Pain Frequency Constant    Aggravating Factors  walking, standing    Pain Relieving Factors over the counter pain meds, resting    Effect of Pain on Daily Activities difficulty sleeping, difficulty with ALD's, walking                [facility] PT Assessment - 07/16/21 0001       Assessment   Medical Diagnosis M54.50 acute right sided low back pain without sciatica    Referring Provider (PT) Dr. Christopher Blackman MD    Hand Dominance Right    Prior Therapy yes following hip replacement      Precautions   Precautions None      Restrictions   Weight Bearing Restrictions No      Balance   Screen   Has the patient fallen in the past 6 months No    Is the patient reluctant to leave their home because of a fear of falling?  No      Home Environment   Living Environment Private residence    Living Arrangements Spouse/significant other    Type of Home House    Home Access Stairs to enter    Entrance Stairs-Number of Steps 2 steps    Entrance Stairs-Rails None    Home Layout Multi-level    Alternate Level Stairs-Number of Steps 14 steps between each of the 3 floors    Alternate Level Stairs-Rails --   handrails   Home Equipment Cane - single point      Prior Function   Level of Independence Independent    Leisure walking in neighborhood      Cognition   Overall Cognitive Status Within Functional Limits for tasks assessed      Observation/Other Assessments   Focus on Therapeutic Outcomes (FOTO)  21% (predicted 51%)      Posture/Postural Control   Posture/Postural Control  Postural limitations    Postural Limitations Rounded Shoulders;Forward head;Decreased lumbar lordosis    Posture Comments scoliosis      ROM / Strength   AROM / PROM / Strength AROM;Strength      AROM   AROM Assessment Site Lumbar;Hip    Right/Left Hip Right;Left    Right Hip Flexion 110    Right Hip ABduction 40    Left Hip Flexion 110    Left Hip ABduction 45    Lumbar Flexion 40 degrees with pain thoughtout range    Lumbar Extension 10 degrees    Lumbar - Right Side Bend 20 degrees    Lumbar - Left Side Bend 20 degrees    Lumbar - Right Rotation limited 75% due to pain    Lumbar - Left Rotation limited 75% due to pain      Strength   Strength Assessment Site Hip;Knee    Right/Left Hip Right;Left    Right Hip Flexion 4-/5    Right Hip Extension 4-/5    Right Hip ABduction 4-/5    Right Hip ADduction 4-/5    Left Hip Flexion 4+/5    Left Hip Extension 4+/5    Left Hip ABduction 4+/5    Left Hip ADduction 4+/5    Right/Left Knee Right;Left    Right Knee Flexion 4/5    Right Knee Extension 4/5    Left Knee Flexion 5/5    Left Knee Extension 5/5      Palpation   Palpation comment TTP, right QL, right lumbar paraspinals      Special Tests   Other special tests negative slump test on the right, negative SLR on right, negative sacral compression bilaterally      Transfers   Five time sit to stand comments  36 seconds with UE support      Ambulation/Gait   Assistive device Straight cane    Gait Pattern Step-to pattern;Decreased stance time - right;Decreased step length - right;Decreased step length - left;Decreased stride length;Decreased weight shift to right;Antalgic;Poor foot clearance - right                        Objective measurements completed on examination: See above findings.       [facility] Adult PT Treatment/Exercise - 07/16/21 0001       Exercises   Exercises Lumbar        Lumbar Exercises: Stretches   Passive Hamstring Stretch Right;1  rep;10 seconds    Single Knee to Chest Stretch Right;2 reps;10 seconds    Lower Trunk Rotation 2 reps;10 seconds    Prone on Elbows Stretch 60 seconds;1 rep    Figure 4 Stretch 1 rep;10 seconds                    PT Education - 07/16/21 1540     Education Details HEP, PT POC    Person(s) Educated Patient    Methods Explanation;Demonstration;Tactile cues;Verbal cues;Handout    Comprehension Returned demonstration;Verbalized understanding              PT Short Term Goals - 07/16/21 1544       PT SHORT TERM GOAL #1   Title Patient will be able to independently initiate HEP    Baseline -    Time 2    Period Weeks    Status New      PT SHORT TERM GOAL #2   Title Pt will be able to amb with st cane with step through gait pattern.    Baseline -    Time 2    Period Weeks    Status New    Target Date 08/02/21      PT SHORT TERM GOAL #3   Title -               PT Long Term Goals - 07/16/21 1610       PT LONG TERM GOAL #1   Title Pt will be independent in her HEP and progression.    Baseline -    Time 4    Period Weeks    Status New    Target Date 08/16/21      PT LONG TERM GOAL #2   Title Pt will be able to perform 5x sit to stand in </= 20 seconds with/without UE support.    Baseline 36 seconds with UE support and increased pain in low back    Time 4    Period Weeks    Status New    Target Date 08/16/21      PT LONG TERM GOAL #3   Title Pt will be able to amb with no assistive device on level surfaces with pain </= 3/10.    Baseline -    Time 4    Period Weeks    Status New    Target Date 08/16/21      PT LONG TERM GOAL #4   Title Pt will improve her FOTO to >/= 51%    Baseline 21% on evaluation    Time 4    Period Weeks    Status New      PT LONG TERM GOAL #5   Title -                    Plan - 07/16/21 1620     Clinical Impression Statement Pt arriving to therapy today reporting new onset of right sided low back  pain which began when walking in her neighborhood. Pt stated that she received an injection on 07/01/2021 which helped some, but pt still reporting episodes of 6-10/10 pain. Pt reporting increased pain with walking and standing. Pt currently using a st cane for balance and fatigue. Pt states she has been more sedentary due to increased pain in her back and feels that she has become weaker overall. Pt with negative slump test and   SLR on the right. Pt with good response to long axis distraction and lumbar PA mobs during examination. Skilled PT needed to address pt's impairments with the below interventions.    Personal Factors and Comorbidities Comorbidity 3+    Comorbidities arthritis, hypothyroidism, foot surgery on right, Left THA 2021    Examination-Activity Limitations Stand;Stairs;Squat;Sleep;Sit;Transfers    Examination-Participation Restrictions Other;Community Activity;Yard Work;Driving    Stability/Clinical Decision Making Stable/Uncomplicated    Clinical Decision Making Low    Rehab Potential Fair    PT Frequency 2x / week    PT Duration 4 weeks    PT Treatment/Interventions ADLs/Self Care Home Management;Cryotherapy;Electrical Stimulation;Iontophoresis 80m/ml Dexamethasone;Moist Heat;Traction;Ultrasound;Balance training;Therapeutic exercise;Therapeutic activities;Functional mobility training;Stair training;Gait training;Neuromuscular re-education;Patient/family education;Passive range of motion;Manual techniques;Dry needling;Taping;Spinal Manipulations    PT Next Visit Plan Nustep, mechanical traction, manual therapy/mobs, lumbar stretching and strengthening    PT Home Exercise Plan Access Code: [email or web address]  URL: [email or web address]  Date: 07/16/2021  Prepared by: JKearney Hard   Exercises  Supine Lower Trunk Rotation - 2 x daily - 5 reps - 10 seconds hold  Hooklying Single Knee to Chest Stretch - 2 x daily - 3-5 reps - 10 seconds hold  Supine Bridge - 2 x daily - 2 sets - 10  reps - 5 seconds hold  Static Prone on Elbows - 2 x daily - 3 reps - 1-2 minutes hold  Hooklying Hamstring Stretch with Strap - 2 x daily - 3 reps - 30 seconds hold    Consulted and Agree with Plan of Care Patient             Patient will benefit from skilled therapeutic intervention in order to improve the following deficits and impairments:  Pain, Decreased range of motion, Difficulty walking, Decreased activity tolerance, Postural dysfunction, Decreased strength, Decreased mobility, Impaired flexibility, Decreased balance  Visit Diagnosis: Difficulty walking - Plan: PT plan of care cert/re-cert  Acute right-sided low back pain with right-sided sciatica - Plan: PT plan of care cert/re-cert  Muscle weakness (generalized) - Plan: PT plan of care cert/re-cert  Unsteadiness on feet - Plan: PT plan of care cert/re-cert   Referring diagnosis? M54.5 Treatment diagnosis? (if different than referring diagnosis) M54.41, R26.81, M62.81, R26.2 What was this (referring dx) caused by? [] Surgery [] Fall [x] Ongoing issue [] Arthritis [] Other: ____________  Laterality: [x] Rt [] Lt [] Both  Check all possible CPT codes:      [x] 925003(Therapeutic Exercise)  [] 970488(SLP Treatment)  [x] 989169(Neuro Re-ed)   [] 945038(Swallowing Treatment)   [x] 988280(Gait Training)   [] 9845 272 0513(Cognitive Training, 1st 15 minutes) [x] 979150(Manual Therapy)   [] 956979(Cognitive Training, each add'l 15 minutes)  [x] 948016(Therapeutic Activities)  [] Other, List CPT Code ____________    [] 955374(Self Care)       [] All codes above (982707- 986754  [x] 949201(Mechanical Traction)  [x] 97014 (E-stim Unattended)  [x] 900712(E-stim manual)  [] 919758(Ionto)  [x] 983254(Ultrasound)  [] 998264(Orthotic Fit) [] [id number](Physical Performance Training) [] [id number](Aquatic Therapy) [] 915830(Contrast Bath) [] [id number](Paraffin) [] [id number](Wound Care 1st 20 sq cm) [] 994076(Wound Care each add'l 20 sq  cm) [] 980881(Vasopneumatic Device) [] 910315(Orthotic Training) [] [id number](Prosthetic Training)   Problem List Patient Active Problem List   Diagnosis Date Noted   Status post total replacement of left hip 02/03/2020   Unilateral primary  osteoarthritis, left hip 12/01/2019   Low back pain 10/27/2019   Right hip pain 07/14/2017   Chest pain 06/11/2015   Hypothyroidism 06/11/2015    Oretha Caprice, PT, MPT 07/16/2021, 4:36 PM   PHYSICAL THERAPY DISCHARGE SUMMARY  Visits from Start of Care: 1  Current functional level related to goals / functional outcomes: See note   Remaining deficits: See note   Education / Equipment: HEP   Patient agrees to discharge. Patient goals were not met. Patient is being discharged due to not returning since the last visit. Scot Jun, PT, DPT, OCS, ATC 08/28/21  3:10 PM    [facility] [address] [place], [place], [address] Phone: [phone]   Fax:  [phone]  Name: Brenda Ortiz MRN: [id number] Date of Birth: 10-23-1941

## 2021-07-19 ENCOUNTER — Encounter: Payer: Medicare PPO | Admitting: Rehabilitative and Restorative Service Providers"

## 2021-07-30 DIAGNOSIS — E039 Hypothyroidism, unspecified: Secondary | ICD-10-CM | POA: Diagnosis not present

## 2021-07-30 DIAGNOSIS — E559 Vitamin D deficiency, unspecified: Secondary | ICD-10-CM | POA: Diagnosis not present

## 2021-08-01 DIAGNOSIS — R030 Elevated blood-pressure reading, without diagnosis of hypertension: Secondary | ICD-10-CM | POA: Diagnosis not present

## 2021-08-01 DIAGNOSIS — M5414 Radiculopathy, thoracic region: Secondary | ICD-10-CM | POA: Diagnosis not present

## 2021-08-05 ENCOUNTER — Encounter: Payer: Medicare PPO | Admitting: Physical Therapy

## 2021-08-06 ENCOUNTER — Other Ambulatory Visit: Payer: Self-pay | Admitting: Neurological Surgery

## 2021-08-06 DIAGNOSIS — N3281 Overactive bladder: Secondary | ICD-10-CM | POA: Diagnosis not present

## 2021-08-06 DIAGNOSIS — E039 Hypothyroidism, unspecified: Secondary | ICD-10-CM | POA: Diagnosis not present

## 2021-08-06 DIAGNOSIS — Z0000 Encounter for general adult medical examination without abnormal findings: Secondary | ICD-10-CM | POA: Diagnosis not present

## 2021-08-06 DIAGNOSIS — Z23 Encounter for immunization: Secondary | ICD-10-CM | POA: Diagnosis not present

## 2021-08-06 DIAGNOSIS — E559 Vitamin D deficiency, unspecified: Secondary | ICD-10-CM | POA: Diagnosis not present

## 2021-08-06 DIAGNOSIS — R030 Elevated blood-pressure reading, without diagnosis of hypertension: Secondary | ICD-10-CM | POA: Diagnosis not present

## 2021-08-06 DIAGNOSIS — Z79890 Hormone replacement therapy (postmenopausal): Secondary | ICD-10-CM | POA: Diagnosis not present

## 2021-08-06 DIAGNOSIS — M48062 Spinal stenosis, lumbar region with neurogenic claudication: Secondary | ICD-10-CM | POA: Diagnosis not present

## 2021-08-06 DIAGNOSIS — D126 Benign neoplasm of colon, unspecified: Secondary | ICD-10-CM | POA: Diagnosis not present

## 2021-08-07 ENCOUNTER — Ambulatory Visit: Payer: Medicare PPO | Admitting: Orthopaedic Surgery

## 2021-08-07 ENCOUNTER — Encounter: Payer: Self-pay | Admitting: Orthopaedic Surgery

## 2021-08-07 ENCOUNTER — Other Ambulatory Visit: Payer: Self-pay

## 2021-08-07 DIAGNOSIS — M545 Low back pain, unspecified: Secondary | ICD-10-CM

## 2021-08-07 NOTE — Progress Notes (Signed)
The patient comes in today just for follow-up after having outpatient physical therapy.  She also had an epidural steroid injection as relates to spine issue.  She did let me know that she is scheduled for microdiscectomy by neurosurgery on 08/20/2021.  I am glad to hear that into the field is going to help her.  The epidural did ease things off for her so surgery is the next option for her because she is looking for complete relief is much as possible.  I have replaced her left hip.  Her right hip exam is still normal.  There is a little bit of pain over the trochanteric area but not severe.  We talked in length in detail about microdiscectomy surgery.  She understands this not my area of expertise but I have seen plenty of patients that can have success after this type of surgery.  I wish her well in her endeavors with that.  All questions and concerns were answered and addressed.  Follow-up can be as needed.

## 2021-08-08 ENCOUNTER — Encounter: Payer: Medicare PPO | Admitting: Rehabilitative and Restorative Service Providers"

## 2021-08-13 ENCOUNTER — Encounter: Payer: Medicare PPO | Admitting: Physical Therapy

## 2021-08-14 NOTE — Progress Notes (Signed)
Surgical Instructions   Your procedure is scheduled on 08/20/21.  Report to [facility] Main Entrance "A" at 11:15 A.M., then check in with the Admitting office.  Call this number if you have problems the morning of surgery:  [phone]   If you have any questions prior to your surgery date call [phone]: Open Monday-Friday 8am-4pm    Remember:  Do not eat after midnight the night before your surgery  You may drink clear liquids until 10:15am the morning of your surgery.   Clear liquids allowed are: Water, Non-Citrus Juices (without pulp), Carbonated Beverages, Clear Tea, Black Coffee ONLY (NO MILK, CREAM OR POWDERED CREAMER of any kind), and Gatorade    Take these medicines the morning of surgery with A SIP OF WATER  MYRBETRIQ  SYNTHROID  IF NEEDED: acetaminophen-codeine (TYLENOL #3)  fexofenadine (ALLEGRA)   As of today, STOP taking any Aspirin (unless otherwise instructed by your surgeon) Aleve, Naproxen, Ibuprofen, Motrin, Advil, Goody's, BC's, all herbal medications, fish oil, and all vitamins.          Do not wear jewelry or makeup Do not wear lotions, powders, perfumes/colognes, or deodorant. Do not shave 48 hours prior to surgery.   Do not bring valuables to the hospital.  DO Not wear nail polish, gel polish, artificial nails, or any other type of covering on natural nails including finger and toenails. If patients have artificial nails, gel coating, etc. that need to be removed by a nail salon please have this removed prior to surgery or surgery may need to be canceled/delayed if the surgeon/ anesthesia feels like the patient is unable to be adequately monitored.             [facility] is not responsible for any belongings or valuables.  Do NOT Smoke (Tobacco/Vaping)  24 hours prior to your procedure If you use a CPAP at night, you may bring your mask for your overnight stay.   Contacts, glasses, dentures or bridgework may not be worn into surgery, please bring  cases for these belongings   For patients admitted to the hospital, discharge time will be determined by your treatment team.   Patients discharged the day of surgery will not be allowed to drive home, and someone needs to stay with them for 24 hours.  NO VISITORS WILL BE ALLOWED IN PRE-OP WHERE PATIENTS GET READY FOR SURGERY.  ONLY 1 SUPPORT PERSON MAY BE PRESENT IN THE WAITING ROOM WHILE YOU ARE IN SURGERY.  IF YOU ARE TO BE ADMITTED, ONCE YOU ARE IN YOUR ROOM YOU WILL BE ALLOWED TWO (2) VISITORS.  Minor children may have two parents present. Special consideration for safety and communication needs will be reviewed on a case by case basis.  Special instructions:    Oral Hygiene is also important to reduce your risk of infection.  Remember - BRUSH YOUR TEETH THE MORNING OF SURGERY WITH YOUR REGULAR TOOTHPASTE   [facility]- Preparing For Surgery  Before surgery, you can play an important role. Because skin is not sterile, your skin needs to be as free of germs as possible. You can reduce the number of germs on your skin by washing with CHG (chlorahexidine gluconate) Soap before surgery.  CHG is an antiseptic cleaner which kills germs and bonds with the skin to continue killing germs even after washing.     Please do not use if you have an allergy to CHG or antibacterial soaps. If your skin becomes reddened/irritated stop using the CHG.  Do not shave (including legs and underarms) for at least 48 hours prior to first CHG shower. It is OK to shave your face.  Please follow these instructions carefully.     Shower the NIGHT BEFORE SURGERY and the MORNING OF SURGERY with CHG Soap.   If you chose to wash your hair, wash your hair first as usual with your normal shampoo. After you shampoo, rinse your hair and body thoroughly to remove the shampoo.  Then [employer] and genitals (private parts) with your normal soap and rinse thoroughly to remove soap.  After that Use CHG Soap as you would any  other liquid soap. You can apply CHG directly to the skin and wash gently with a scrungie or a clean washcloth.   Apply the CHG Soap to your body ONLY FROM THE NECK DOWN.  Do not use on open wounds or open sores. Avoid contact with your eyes, ears, mouth and genitals (private parts). Wash Face and genitals (private parts)  with your normal soap.   Wash thoroughly, paying special attention to the area where your surgery will be performed.  Thoroughly rinse your body with warm water from the neck down.  DO NOT shower/wash with your normal soap after using and rinsing off the CHG Soap.  Pat yourself dry with a CLEAN TOWEL.  Wear CLEAN PAJAMAS to bed the night before surgery  Place CLEAN SHEETS on your bed the night before your surgery  DO NOT SLEEP WITH PETS.   Day of Surgery: Take a shower with CHG soap. Wear Clean/Comfortable clothing the morning of surgery Do not apply any deodorants/lotions.   Remember to brush your teeth WITH YOUR REGULAR TOOTHPASTE.   Please read over the following fact sheets that you were given.

## 2021-08-15 ENCOUNTER — Other Ambulatory Visit: Payer: Self-pay

## 2021-08-15 ENCOUNTER — Encounter (HOSPITAL_COMMUNITY): Payer: Self-pay

## 2021-08-15 ENCOUNTER — Encounter (HOSPITAL_COMMUNITY)
Admission: RE | Admit: 2021-08-15 | Discharge: 2021-08-15 | Disposition: A | Discharge status: Home / Self Care | Payer: Medicare PPO | Source: Ambulatory Visit | Attending: Neurological Surgery | Admitting: Neurological Surgery

## 2021-08-15 ENCOUNTER — Encounter: Payer: Medicare PPO | Admitting: Rehabilitative and Restorative Service Providers"

## 2021-08-15 DIAGNOSIS — Z20822 Contact with and (suspected) exposure to covid-19: Secondary | ICD-10-CM | POA: Diagnosis not present

## 2021-08-15 DIAGNOSIS — Z01812 Encounter for preprocedural laboratory examination: Secondary | ICD-10-CM | POA: Diagnosis not present

## 2021-08-15 LAB — SURGICAL PCR SCREEN
MRSA, PCR: NEGATIVE
Staphylococcus aureus: POSITIVE — AB

## 2021-08-15 LAB — CBC
HCT: 43.3 % (ref 36.0–46.0)
Hemoglobin: 13.8 g/dL (ref 12.0–15.0)
MCH: 32.2 pg (ref 26.0–34.0)
MCHC: 31.9 g/dL (ref 30.0–36.0)
MCV: 100.9 fL — ABNORMAL HIGH (ref 80.0–100.0)
Platelets: 278 10*3/uL (ref 150–400)
RBC: 4.29 MIL/uL (ref 3.87–5.11)
RDW: 14.2 % (ref 11.5–15.5)
WBC: 7 10*3/uL (ref 4.0–10.5)
nRBC: 0 % (ref 0.0–0.2)

## 2021-08-15 LAB — SARS CORONAVIRUS 2 (TAT 6-24 HRS): SARS Coronavirus 2: NEGATIVE

## 2021-08-15 NOTE — Progress Notes (Signed)
PCP - Dr. Chilton Greathouse Cardiologist - Denies  Chest x-ray - Not indication EKG - Not indication  DM - Denies  ERAS Protcol - Yes  COVID TEST- 08/15/21   Anesthesia review: No  Patient denies shortness of breath, fever, cough and chest pain at PAT appointment   All instructions explained to the patient, with a verbal understanding of the material. Patient agrees to go over the instructions while at home for a better understanding. Patient also instructed to wear a mask while in public after being tested for COVID-19. The opportunity to ask questions was provided.

## 2021-08-20 ENCOUNTER — Encounter (HOSPITAL_COMMUNITY)
Admission: RE | Disposition: A | Discharge status: Home / Self Care | Payer: Self-pay | Source: Ambulatory Visit | Attending: Neurological Surgery

## 2021-08-20 ENCOUNTER — Ambulatory Visit (HOSPITAL_COMMUNITY): Payer: Medicare PPO | Admitting: Certified Registered"

## 2021-08-20 ENCOUNTER — Ambulatory Visit (HOSPITAL_COMMUNITY): Payer: Medicare PPO

## 2021-08-20 ENCOUNTER — Observation Stay (HOSPITAL_COMMUNITY)
Admission: RE | Admit: 2021-08-20 | Discharge: 2021-08-21 | Disposition: A | Discharge status: Home / Self Care | Payer: Medicare PPO | Source: Ambulatory Visit | Attending: Neurological Surgery | Admitting: Neurological Surgery

## 2021-08-20 ENCOUNTER — Encounter: Payer: Medicare PPO | Admitting: Physical Therapy

## 2021-08-20 ENCOUNTER — Encounter (HOSPITAL_COMMUNITY): Payer: Self-pay | Admitting: Neurological Surgery

## 2021-08-20 ENCOUNTER — Other Ambulatory Visit: Payer: Self-pay

## 2021-08-20 DIAGNOSIS — M5115 Intervertebral disc disorders with radiculopathy, thoracolumbar region: Secondary | ICD-10-CM | POA: Diagnosis not present

## 2021-08-20 DIAGNOSIS — M1612 Unilateral primary osteoarthritis, left hip: Secondary | ICD-10-CM | POA: Diagnosis not present

## 2021-08-20 DIAGNOSIS — R2689 Other abnormalities of gait and mobility: Secondary | ICD-10-CM | POA: Diagnosis not present

## 2021-08-20 DIAGNOSIS — M5416 Radiculopathy, lumbar region: Secondary | ICD-10-CM | POA: Diagnosis present

## 2021-08-20 DIAGNOSIS — Z9889 Other specified postprocedural states: Secondary | ICD-10-CM | POA: Diagnosis not present

## 2021-08-20 DIAGNOSIS — Z20822 Contact with and (suspected) exposure to covid-19: Secondary | ICD-10-CM | POA: Diagnosis not present

## 2021-08-20 DIAGNOSIS — J302 Other seasonal allergic rhinitis: Secondary | ICD-10-CM | POA: Diagnosis not present

## 2021-08-20 DIAGNOSIS — Z87891 Personal history of nicotine dependence: Secondary | ICD-10-CM | POA: Insufficient documentation

## 2021-08-20 DIAGNOSIS — Z419 Encounter for procedure for purposes other than remedying health state, unspecified: Secondary | ICD-10-CM

## 2021-08-20 DIAGNOSIS — E039 Hypothyroidism, unspecified: Secondary | ICD-10-CM | POA: Insufficient documentation

## 2021-08-20 HISTORY — PX: LUMBAR LAMINECTOMY/ DECOMPRESSION WITH MET-RX: SHX5959

## 2021-08-20 LAB — SARS CORONAVIRUS 2 BY RT PCR (HOSPITAL ORDER, PERFORMED IN ~~LOC~~ HOSPITAL LAB): SARS Coronavirus 2: NEGATIVE

## 2021-08-20 SURGERY — LUMBAR LAMINECTOMY/ DECOMPRESSION WITH MET-RX
Anesthesia: General | Laterality: Right

## 2021-08-20 MED ORDER — THROMBIN 5000 UNITS EX SOLR
CUTANEOUS | Status: AC
  Filled 2021-08-20: qty 5000

## 2021-08-20 MED ORDER — CHLORHEXIDINE GLUCONATE CLOTH 2 % EX PADS: 6.0000 | MEDICATED_PAD | Freq: Once | CUTANEOUS | Status: DC

## 2021-08-20 MED ORDER — FENTANYL CITRATE (PF) 250 MCG/5ML IJ SOLN
INTRAMUSCULAR | Status: DC | PRN
  Administered 2021-08-20 (×2): 50 ug via INTRAVENOUS

## 2021-08-20 MED ORDER — OXYCODONE HCL 5 MG PO TABS: 5.0000 mg | ORAL_TABLET | Freq: Once | ORAL | Status: DC | PRN

## 2021-08-20 MED ORDER — ACETAMINOPHEN 500 MG PO TABS
1000.0000 mg | ORAL_TABLET | Freq: Once | ORAL | Status: AC
  Administered 2021-08-20: 1000 mg via ORAL
  Filled 2021-08-20: qty 2

## 2021-08-20 MED ORDER — SODIUM CHLORIDE 0.9 % IV SOLN
250.0000 mL | INTRAVENOUS | Status: DC
  Administered 2021-08-20: 250 mL via INTRAVENOUS

## 2021-08-20 MED ORDER — SUGAMMADEX SODIUM 200 MG/2ML IV SOLN
INTRAVENOUS | Status: DC | PRN
  Administered 2021-08-20: 200 mg via INTRAVENOUS

## 2021-08-20 MED ORDER — POLYETHYLENE GLYCOL 3350 17 G PO PACK: 17.0000 g | PACK | Freq: Every day | ORAL | Status: DC | PRN

## 2021-08-20 MED ORDER — SODIUM CHLORIDE 0.9% FLUSH: 3.0000 mL | INTRAVENOUS | Status: DC | PRN

## 2021-08-20 MED ORDER — MEDROXYPROGESTERONE ACETATE 2.5 MG PO TABS
2.5000 mg | ORAL_TABLET | Freq: Every day | ORAL | Status: DC
  Administered 2021-08-21: 2.5 mg via ORAL
  Filled 2021-08-20 (×2): qty 1

## 2021-08-20 MED ORDER — ESTROGENS CONJUGATED 0.625 MG PO TABS
0.6250 mg | ORAL_TABLET | Freq: Every day | ORAL | Status: DC
  Administered 2021-08-21: 0.625 mg via ORAL
  Filled 2021-08-20 (×2): qty 1

## 2021-08-20 MED ORDER — HYDROMORPHONE HCL 1 MG/ML IJ SOLN: 0.2500 mg | INTRAMUSCULAR | Status: DC | PRN

## 2021-08-20 MED ORDER — LORATADINE 10 MG PO TABS
10.0000 mg | ORAL_TABLET | Freq: Every day | ORAL | Status: DC
  Administered 2021-08-21: 10 mg via ORAL
  Filled 2021-08-20: qty 1

## 2021-08-20 MED ORDER — CHLORHEXIDINE GLUCONATE 0.12 % MT SOLN: 15.0000 mL | Freq: Once | OROMUCOSAL | Status: AC

## 2021-08-20 MED ORDER — 0.9 % SODIUM CHLORIDE (POUR BTL) OPTIME
TOPICAL | Status: DC | PRN
  Administered 2021-08-20: 1000 mL

## 2021-08-20 MED ORDER — PROPOFOL 10 MG/ML IV BOLUS
INTRAVENOUS | Status: DC | PRN
  Administered 2021-08-20: 150 mg via INTRAVENOUS

## 2021-08-20 MED ORDER — MIRABEGRON ER 50 MG PO TB24
50.0000 mg | ORAL_TABLET | Freq: Every day | ORAL | Status: DC
  Administered 2021-08-21: 50 mg via ORAL
  Filled 2021-08-20: qty 1

## 2021-08-20 MED ORDER — OXYCODONE HCL 5 MG/5ML PO SOLN: 5.0000 mg | Freq: Once | ORAL | Status: DC | PRN

## 2021-08-20 MED ORDER — ROCURONIUM 10MG/ML (10ML) SYRINGE FOR MEDFUSION PUMP - OPTIME
INTRAVENOUS | Status: DC | PRN
  Administered 2021-08-20: 70 mg via INTRAVENOUS

## 2021-08-20 MED ORDER — ORAL CARE MOUTH RINSE: 15.0000 mL | Freq: Once | OROMUCOSAL | Status: AC

## 2021-08-20 MED ORDER — SODIUM CHLORIDE 0.9% FLUSH: 3.0000 mL | Freq: Two times a day (BID) | INTRAVENOUS | Status: DC

## 2021-08-20 MED ORDER — ACETAMINOPHEN-CODEINE #3 300-30 MG PO TABS
1.0000 | ORAL_TABLET | Freq: Four times a day (QID) | ORAL | Status: DC | PRN
  Administered 2021-08-20 – 2021-08-21 (×2): 1 via ORAL
  Filled 2021-08-20 (×2): qty 1

## 2021-08-20 MED ORDER — PROPOFOL 10 MG/ML IV BOLUS
INTRAVENOUS | Status: AC
  Filled 2021-08-20: qty 20

## 2021-08-20 MED ORDER — CEFAZOLIN SODIUM-DEXTROSE 2-4 GM/100ML-% IV SOLN
INTRAVENOUS | Status: AC
  Filled 2021-08-20: qty 100

## 2021-08-20 MED ORDER — LIDOCAINE-EPINEPHRINE 1 %-1:100000 IJ SOLN
INTRAMUSCULAR | Status: DC | PRN
  Administered 2021-08-20: 7 mL

## 2021-08-20 MED ORDER — ONDANSETRON HCL 4 MG/2ML IJ SOLN
INTRAMUSCULAR | Status: DC | PRN
  Administered 2021-08-20: 4 mg via INTRAVENOUS

## 2021-08-20 MED ORDER — ACETAMINOPHEN 325 MG PO TABS: 650.0000 mg | ORAL_TABLET | ORAL | Status: DC | PRN

## 2021-08-20 MED ORDER — OXYCODONE HCL 5 MG PO TABS
5.0000 mg | ORAL_TABLET | ORAL | Status: DC | PRN
Start: 2021-08-20 — End: 2021-08-20

## 2021-08-20 MED ORDER — CEFAZOLIN SODIUM-DEXTROSE 2-4 GM/100ML-% IV SOLN
2.0000 g | INTRAVENOUS | Status: AC
  Administered 2021-08-20: 2 g via INTRAVENOUS

## 2021-08-20 MED ORDER — FENTANYL CITRATE (PF) 100 MCG/2ML IJ SOLN: 25.0000 ug | INTRAMUSCULAR | Status: DC | PRN

## 2021-08-20 MED ORDER — PHENYLEPHRINE HCL-NACL 20-0.9 MG/250ML-% IV SOLN
INTRAVENOUS | Status: DC | PRN
  Administered 2021-08-20: 50 ug/min via INTRAVENOUS

## 2021-08-20 MED ORDER — CYCLOBENZAPRINE HCL 10 MG PO TABS: 10.0000 mg | ORAL_TABLET | Freq: Three times a day (TID) | ORAL | Status: DC | PRN

## 2021-08-20 MED ORDER — ONDANSETRON HCL 4 MG/2ML IJ SOLN: 4.0000 mg | Freq: Four times a day (QID) | INTRAMUSCULAR | Status: DC | PRN

## 2021-08-20 MED ORDER — AMISULPRIDE (ANTIEMETIC) 5 MG/2ML IV SOLN: 10.0000 mg | Freq: Once | INTRAVENOUS | Status: DC | PRN

## 2021-08-20 MED ORDER — THROMBIN 5000 UNITS EX SOLR
OROMUCOSAL | Status: DC | PRN
  Administered 2021-08-20: 5 mL via TOPICAL

## 2021-08-20 MED ORDER — CEFAZOLIN SODIUM-DEXTROSE 2-4 GM/100ML-% IV SOLN
2.0000 g | Freq: Three times a day (TID) | INTRAVENOUS | Status: AC
  Administered 2021-08-20 – 2021-08-21 (×2): 2 g via INTRAVENOUS
  Filled 2021-08-20 (×2): qty 100

## 2021-08-20 MED ORDER — ACETAMINOPHEN 500 MG PO TABS
500.0000 mg | ORAL_TABLET | Freq: Four times a day (QID) | ORAL | Status: DC | PRN
  Administered 2021-08-20 – 2021-08-21 (×2): 500 mg via ORAL
  Filled 2021-08-20 (×2): qty 1

## 2021-08-20 MED ORDER — LIDOCAINE HCL (CARDIAC) PF 100 MG/5ML IV SOSY
PREFILLED_SYRINGE | INTRAVENOUS | Status: DC | PRN
  Administered 2021-08-20: 100 mg via INTRATRACHEAL

## 2021-08-20 MED ORDER — DOCUSATE SODIUM 100 MG PO CAPS
100.0000 mg | ORAL_CAPSULE | Freq: Two times a day (BID) | ORAL | Status: DC
  Administered 2021-08-20 – 2021-08-21 (×2): 100 mg via ORAL
  Filled 2021-08-20 (×2): qty 1

## 2021-08-20 MED ORDER — DEXAMETHASONE SODIUM PHOSPHATE 10 MG/ML IJ SOLN
INTRAMUSCULAR | Status: DC | PRN
  Administered 2021-08-20: 10 mg via INTRAVENOUS

## 2021-08-20 MED ORDER — CONJ ESTROG-MEDROXYPROGEST ACE 0.625-2.5 MG PO TABS: 1.0000 | ORAL_TABLET | Freq: Every day | ORAL | Status: DC

## 2021-08-20 MED ORDER — HYDROMORPHONE HCL 1 MG/ML IJ SOLN: 1.0000 mg | INTRAMUSCULAR | Status: DC | PRN

## 2021-08-20 MED ORDER — MENTHOL 3 MG MT LOZG
1.0000 | LOZENGE | OROMUCOSAL | Status: DC | PRN
  Filled 2021-08-20: qty 9

## 2021-08-20 MED ORDER — CHLORHEXIDINE GLUCONATE 0.12 % MT SOLN
OROMUCOSAL | Status: AC
  Administered 2021-08-20: 15 mL via OROMUCOSAL
  Filled 2021-08-20: qty 15

## 2021-08-20 MED ORDER — LACTATED RINGERS IV SOLN: INTRAVENOUS | Status: DC

## 2021-08-20 MED ORDER — LIDOCAINE-EPINEPHRINE 1 %-1:100000 IJ SOLN
INTRAMUSCULAR | Status: AC
  Filled 2021-08-20: qty 1

## 2021-08-20 MED ORDER — OXYCODONE HCL 5 MG PO TABS: 10.0000 mg | ORAL_TABLET | ORAL | Status: DC | PRN

## 2021-08-20 MED ORDER — LACTATED RINGERS IV SOLN: INTRAVENOUS | Status: DC | PRN

## 2021-08-20 MED ORDER — PHENOL 1.4 % MT LIQD: 1.0000 | OROMUCOSAL | Status: DC | PRN

## 2021-08-20 MED ORDER — LEVOTHYROXINE SODIUM 75 MCG PO TABS
75.0000 ug | ORAL_TABLET | Freq: Every day | ORAL | Status: DC
  Administered 2021-08-21: 75 ug via ORAL
  Filled 2021-08-20: qty 1

## 2021-08-20 MED ORDER — ACETAMINOPHEN 650 MG RE SUPP: 650.0000 mg | RECTAL | Status: DC | PRN

## 2021-08-20 MED ORDER — PROMETHAZINE HCL 25 MG/ML IJ SOLN: 6.2500 mg | INTRAMUSCULAR | Status: DC | PRN

## 2021-08-20 MED ORDER — FENTANYL CITRATE (PF) 250 MCG/5ML IJ SOLN
INTRAMUSCULAR | Status: AC
  Filled 2021-08-20: qty 5

## 2021-08-20 MED ORDER — ONDANSETRON HCL 4 MG PO TABS: 4.0000 mg | ORAL_TABLET | Freq: Four times a day (QID) | ORAL | Status: DC | PRN

## 2021-08-20 SURGICAL SUPPLY — 47 items
BAG COUNTER SPONGE SURGICOUNT (BAG) ×2 IMPLANT
BAND RUBBER #18 3X1/16 STRL (MISCELLANEOUS) ×4 IMPLANT
BLADE CLIPPER SURG (BLADE) IMPLANT
BLADE SURG 11 STRL SS (BLADE) ×2 IMPLANT
BUR PRECISION FLUTE 5.0 (BURR) IMPLANT
BUR PRECISION MATCH 3.0 13 (BURR) ×2 IMPLANT
CANISTER SUCT 3000ML PPV (MISCELLANEOUS) ×2 IMPLANT
COVER SURGICAL LIGHT HANDLE (MISCELLANEOUS) ×2 IMPLANT
DECANTER SPIKE VIAL GLASS SM (MISCELLANEOUS) ×2 IMPLANT
DERMABOND ADVANCED (GAUZE/BANDAGES/DRESSINGS) ×1
DERMABOND ADVANCED .7 DNX12 (GAUZE/BANDAGES/DRESSINGS) ×1 IMPLANT
DRAPE C-ARM 42X72 X-RAY (DRAPES) ×4 IMPLANT
DRAPE LAPAROTOMY 100X72X124 (DRAPES) ×2 IMPLANT
DRAPE MICROSCOPE LEICA (MISCELLANEOUS) ×2 IMPLANT
DRAPE SURG 17X23 STRL (DRAPES) ×2 IMPLANT
DURAPREP 26ML APPLICATOR (WOUND CARE) ×2 IMPLANT
ELECT BLADE 6.5 EXT (BLADE) ×2 IMPLANT
ELECT REM PT RETURN 9FT ADLT (ELECTROSURGICAL) ×2
ELECTRODE REM PT RTRN 9FT ADLT (ELECTROSURGICAL) ×1 IMPLANT
GAUZE 4X4 16PLY ~~LOC~~+RFID DBL (SPONGE) ×2 IMPLANT
GAUZE SPONGE 4X4 12PLY STRL (GAUZE/BANDAGES/DRESSINGS) IMPLANT
GLOVE EXAM NITRILE LRG STRL (GLOVE) IMPLANT
GLOVE EXAM NITRILE XL STR (GLOVE) IMPLANT
GLOVE EXAM NITRILE XS STR PU (GLOVE) IMPLANT
GLOVE SURG LTX SZ7.5 (GLOVE) ×2 IMPLANT
GLOVE SURG UNDER POLY LF SZ7.5 (GLOVE) ×2 IMPLANT
GOWN STRL REUS W/ TWL LRG LVL3 (GOWN DISPOSABLE) ×2 IMPLANT
GOWN STRL REUS W/ TWL XL LVL3 (GOWN DISPOSABLE) IMPLANT
GOWN STRL REUS W/TWL 2XL LVL3 (GOWN DISPOSABLE) IMPLANT
GOWN STRL REUS W/TWL LRG LVL3 (GOWN DISPOSABLE) ×4
GOWN STRL REUS W/TWL XL LVL3 (GOWN DISPOSABLE)
HEMOSTAT POWDER KIT SURGIFOAM (HEMOSTASIS) ×2 IMPLANT
KIT BASIN OR (CUSTOM PROCEDURE TRAY) ×2 IMPLANT
KIT TURNOVER KIT B (KITS) ×2 IMPLANT
NEEDLE HYPO 22GX1.5 SAFETY (NEEDLE) ×2 IMPLANT
NEEDLE SPNL 18GX3.5 QUINCKE PK (NEEDLE) ×2 IMPLANT
NS IRRIG 1000ML POUR BTL (IV SOLUTION) ×2 IMPLANT
PACK LAMINECTOMY NEURO (CUSTOM PROCEDURE TRAY) ×2 IMPLANT
PAD ARMBOARD 7.5X6 YLW CONV (MISCELLANEOUS) ×6 IMPLANT
SPONGE T-LAP 4X18 ~~LOC~~+RFID (SPONGE) IMPLANT
SUT MNCRL AB 3-0 PS2 18 (SUTURE) IMPLANT
SUT VIC AB 0 CT1 18XCR BRD8 (SUTURE) IMPLANT
SUT VIC AB 0 CT1 8-18 (SUTURE)
SUT VIC AB 2-0 CP2 18 (SUTURE) ×2 IMPLANT
TOWEL GREEN STERILE (TOWEL DISPOSABLE) ×2 IMPLANT
TOWEL GREEN STERILE FF (TOWEL DISPOSABLE) ×2 IMPLANT
WATER STERILE IRR 1000ML POUR (IV SOLUTION) ×2 IMPLANT

## 2021-08-20 NOTE — Transfer of Care (Signed)
Immediate Anesthesia Transfer of Care Note  Patient: Brenda Ortiz  Procedure(s) Performed: Right Thoracic 12-Lumbar 1 Minimally invasive discectomy with metrx (Right)  Patient Location: PACU  Anesthesia Type:General  Level of Consciousness: awake, alert  and oriented  Airway & Oxygen Therapy: Patient Spontanous Breathing and Patient connected to nasal cannula oxygen  Post-op Assessment: Report given to RN, Post -op Vital signs reviewed and stable and Patient moving all extremities X 4  Post vital signs: Reviewed and stable  Last Vitals:  Vitals Value Taken Time  BP 149/72 08/20/21 1511  Temp 36.4 C 08/20/21 1510  Pulse 65 08/20/21 1516  Resp 19 08/20/21 1516  SpO2 100 % 08/20/21 1516  Vitals shown include unvalidated device data.  Last Pain:  Vitals:   08/20/21 1510  TempSrc:   PainSc: 0-No pain         Complications: No notable events documented.

## 2021-08-20 NOTE — Anesthesia Procedure Notes (Signed)
Procedure Name: Intubation Date/Time: 08/20/2021 1:13 PM Performed by: Claris Che, CRNA Pre-anesthesia Checklist: Patient identified, Emergency Drugs available, Suction available, Patient being monitored and Timeout performed Patient Re-evaluated:Patient Re-evaluated prior to induction Oxygen Delivery Method: Circle system utilized Preoxygenation: Pre-oxygenation with 100% oxygen Induction Type: IV induction and Cricoid Pressure applied Ventilation: Mask ventilation without difficulty Laryngoscope Size: Mac and 4 Grade View: Grade II Tube type: Oral Tube size: 8.0 mm Number of attempts: 1 Airway Equipment and Method: Stylet Placement Confirmation: ETT inserted through vocal cords under direct vision, positive ETCO2 and breath sounds checked- equal and bilateral Secured at: 22 cm Tube secured with: Tape Dental Injury: Teeth and Oropharynx as per pre-operative assessment

## 2021-08-20 NOTE — Op Note (Signed)
PATIENT: Brenda Ortiz  DAY OF SURGERY: 08/20/21   PRE-OPERATIVE DIAGNOSIS:  Herniated nucleus pulposus with radiculopathy   POST-OPERATIVE DIAGNOSIS:  Same   PROCEDURE:  Right minimally invasive T12-L1 discectomy   SURGEON:  Surgeon(s) and Role:    Jadene Pierini, MD - Primary     Lisbeth Renshaw, MD - Assistant   ANESTHESIA: ETGA   BRIEF HISTORY: This is a 80 year old woman who presented with refractory proximal right lower extremity and abdominal radicular pain, MRI showed a corresponding disc herniation and and nerve root compression at T12-L1. I therefore recommended a minimally invasive right T12-L1 discectomy. This was discussed with the patient as well as risks, benefits, and alternatives and the patient wished to proceed with surgical treatment.    OPERATIVE DETAIL: The patient was taken to the operating room and placed on the OR table in the prone position. A formal time out was performed with two patient identifiers and confirmed the operative site. Anesthesia was induced by the anesthesia team. The operative site was marked, hair was clipped with surgical clippers, the area was then prepped and draped in a sterile fashion. Fluoroscopy was used to identify the surgical level prior to incision. I used AP then lateral to confirm, counting up from the sacrum to use the same numbering from the patient's preop MRI.  A 2cm incision was then marked 1cm off to the right of midline. The fascia was incised sharply and serial dilators were docked to the lamino-facet junction using fluoroscopy to confirm position as well as perform a second count to confirm the correct surgical level. After a final dilator was placed, a tubular retractor was placed over this and secured to the table. The operating microscope was draped and brought into the field. Anatomy was palpated and confirmed, monopolar cautery was used to expose the facet, lamina, and a portion of the spinous process to confirm  orientation. A high speed drill and kerrison rongeurs were then used to create a hemilaminotomy and partial facetectomy. The ligamentum flavum was resected and the thecal sac and exiting nerve root were identified. Care was taken to avoid any manipulation of the thecal sac. A large disc herniation was clearly present and was pushing the root inferiorly. It was carefully separated off the root then removed. Multiple large disc fragments that were easily removed. The annulotomy was explored and additional free fragments were removed. The traversing nerve root was palpated throughout its visible course to confirm good decompression and the foraminotomy was extended laterally to confirm good decompression.   Hemostasis was obtained, the wound was copiously irrigated, and the tube was removed while using the microscope to confirm hemostasis of the muscle edges. All instrument and sponge counts were correct and the incision was then closed in layers. The patient was then returned to anesthesia for emergence. No apparent complications at the completion of the procedure.    EBL:  50mL   DRAINS: none   SPECIMENS: none   Jadene Pierini, MD 08/20/21 3:05 PM

## 2021-08-20 NOTE — H&P (Signed)
Surgical H&P Update  HPI: 80 y.o. woman with right lower extremity radicular pain, workup showed a right T12-L1 foraminal disc herniation. Her symptoms were refractory to non-surgical treatment measures. No changes in health since she was last seen. Still having RLE pain and wishes to proceed with surgery.  PMHx:  Past Medical History:  Diagnosis Date   Arthritis    Herniated lumbar intervertebral disc 2022   Hypothyroidism    Incontinence    Seasonal allergies    UTI (urinary tract infection) 06/2021   FamHx:  Family History  Problem Relation Age of Onset   Arthritis Mother    Arthritis Father    Diabetes Father    Diabetes Brother    Diabetes Paternal Grandmother    SocHx:  reports that she quit smoking about 50 years ago. Her smoking use included cigarettes. She has a 5.00 pack-year smoking history. She has never used smokeless tobacco. She reports current alcohol use. She reports that she does not use drugs.  Physical Exam: Strength 5/5 x4, SILTx4  Assesment/Plan: 80 y.o. woman with RLE radicular pain 2/2 R T12-L1 foraminal disc herniation, here for MIS discectomy. Risks, benefits, and alternatives discussed and the patient would like to continue with surgery.  -OR today -3C post-op  Jadene Pierini, MD 08/20/21 12:34 PM

## 2021-08-20 NOTE — Anesthesia Preprocedure Evaluation (Addendum)
Anesthesia Evaluation  Patient identified by MRN, date of birth, ID band Patient awake    Reviewed: Allergy & Precautions, NPO status , Patient's Chart, lab work & pertinent test results  Airway Mallampati: II  TM Distance: >3 FB Neck ROM: Full    Dental no notable dental hx.    Pulmonary neg pulmonary ROS, former smoker,    Pulmonary exam normal breath sounds clear to auscultation       Cardiovascular negative cardio ROS Normal cardiovascular exam Rhythm:Regular Rate:Normal     Neuro/Psych negative neurological ROS  negative psych ROS   GI/Hepatic negative GI ROS, Neg liver ROS,   Endo/Other  Hypothyroidism   Renal/GU negative Renal ROS  negative genitourinary   Musculoskeletal  (+) Arthritis , Osteoarthritis,    Abdominal   Peds  Hematology negative hematology ROS (+)   Anesthesia Other Findings   Reproductive/Obstetrics                            Anesthesia Physical Anesthesia Plan  ASA: 2  Anesthesia Plan: General   Post-op Pain Management:    Induction: Intravenous  PONV Risk Score and Plan: 3 and Dexamethasone, Ondansetron, Treatment may vary due to age or medical condition and Midazolam  Airway Management Planned: Oral ETT  Additional Equipment:   Intra-op Plan:   Post-operative Plan: Extubation in OR  Informed Consent: I have reviewed the patients History and Physical, chart, labs and discussed the procedure including the risks, benefits and alternatives for the proposed anesthesia with the patient or authorized representative who has indicated his/her understanding and acceptance.     Dental advisory given  Plan Discussed with: CRNA  Anesthesia Plan Comments:        Anesthesia Quick Evaluation

## 2021-08-21 ENCOUNTER — Encounter (HOSPITAL_COMMUNITY): Payer: Self-pay | Admitting: Neurological Surgery

## 2021-08-21 DIAGNOSIS — E039 Hypothyroidism, unspecified: Secondary | ICD-10-CM | POA: Diagnosis not present

## 2021-08-21 DIAGNOSIS — Z87891 Personal history of nicotine dependence: Secondary | ICD-10-CM | POA: Diagnosis not present

## 2021-08-21 DIAGNOSIS — M5115 Intervertebral disc disorders with radiculopathy, thoracolumbar region: Secondary | ICD-10-CM | POA: Diagnosis not present

## 2021-08-21 DIAGNOSIS — R2689 Other abnormalities of gait and mobility: Secondary | ICD-10-CM | POA: Diagnosis not present

## 2021-08-21 DIAGNOSIS — Z20822 Contact with and (suspected) exposure to covid-19: Secondary | ICD-10-CM | POA: Diagnosis not present

## 2021-08-21 MED ORDER — ACETAMINOPHEN-CODEINE #3 300-30 MG PO TABS: 1.0000 | ORAL_TABLET | Freq: Three times a day (TID) | ORAL | 0 refills | Status: DC | PRN

## 2021-08-21 NOTE — Anesthesia Postprocedure Evaluation (Signed)
Anesthesia Post Note  Patient: Brenda Ortiz  Procedure(s) Performed: Right Thoracic 12-Lumbar 1 Minimally invasive discectomy with metrx (Right)     Patient location during evaluation: PACU Anesthesia Type: General Level of consciousness: awake and alert Pain management: pain level controlled Vital Signs Assessment: post-procedure vital signs reviewed and stable Respiratory status: spontaneous breathing, nonlabored ventilation, respiratory function stable and patient connected to nasal cannula oxygen Cardiovascular status: blood pressure returned to baseline and stable Postop Assessment: no apparent nausea or vomiting Anesthetic complications: no   No notable events documented.  Last Vitals:  Vitals:   08/21/21 0401 08/21/21 0725  BP: 126/71 (!) 141/64  Pulse: 67 76  Resp: 18 18  Temp: 36.5 C 36.6 C  SpO2: 99% 99%    Last Pain:  Vitals:   08/21/21 0725  TempSrc: Oral  PainSc:                  Kennieth Rad

## 2021-08-21 NOTE — Discharge Instructions (Signed)
   Call Your Doctor If Any of These Occur Redness, drainage, or swelling at the wound.  Temperature greater than 101 degrees. Severe pain not relieved by pain medication. Incision starts to come apart.  

## 2021-08-21 NOTE — Progress Notes (Signed)
Neurosurgery Service Progress Note  Subjective: No acute events overnight, no radicular pain   Objective: Vitals:   08/20/21 1926 08/20/21 2305 08/21/21 0401 08/21/21 0725  BP: 134/66 (!) 128/58 126/71 (!) 141/64  Pulse: 77 69 67 76  Resp: 18 18 18 18   Temp: 98.1 F (36.7 C) 98 F (36.7 C) 97.7 F (36.5 C) 97.8 F (36.6 C)  TempSrc: Oral Oral Oral Oral  SpO2: 99% 98% 99% 99%  Weight:      Height:        Physical Exam: Strength 5/5 x4, SILTx4  Assessment & Plan: 80 y.o. woman s/p R MIS T12-L1 MIS discectomy, recovering well.  -discharge home today  62  08/21/21 7:40 AM

## 2021-08-21 NOTE — Evaluation (Signed)
Physical Therapy Evaluation  Patient Details Name: Brenda Ortiz MRN: [id number] DOB: 1941-11-14 Today's Date: 08/21/2021  History of Present Illness  Pt is a 80 y/o female who presents s/p uncomplicated right T12-L1 discectomy on 08/20/2021. PMH significant for arthritis, hypothyroidism, UTI, L THA.  Clinical Impression  Pt admitted with above diagnosis. At the time of PT eval, pt was able to demonstrate transfers and ambulation with up to min assist for balance support and safety, and SPC for support. Pt was educated on precautions, positioning recommendations, appropriate activity progression, and car transfer. Pt currently with functional limitations due to the deficits listed below (see PT Problem List). Pt will benefit from skilled PT to increase their independence and safety with mobility to allow discharge to the venue listed below.         Recommendations for follow up therapy are one component of a multi-disciplinary discharge planning process, led by the attending physician.  Recommendations may be updated based on patient status, additional functional criteria and insurance authorization.  Follow Up Recommendations No PT follow up;Supervision for mobility/OOB    Equipment Recommendations  None recommended by PT    Recommendations for Other Services       Precautions / Restrictions Precautions Precautions: Back;Fall Precaution Booklet Issued: Yes (comment) Precaution Comments: Reviewed handout and pt was cued for precautions during functional mobility. Spinal Brace:  (No brace needed order) Restrictions Weight Bearing Restrictions: No      Mobility  Bed Mobility Overal bed mobility: Needs Assistance Bed Mobility: Rolling;Sidelying to Sit Rolling: Supervision Sidelying to sit: Supervision     Sit to sidelying: Min guard General bed mobility comments: VC's for sequencing. HOB flat and rails utilized to simulate home environment.    Transfers Overall transfer  level: Needs assistance Equipment used: None Transfers: Sit to/from Stand Sit to Stand: Min assist         General transfer comment: Pt slightly unsteady upon standing but no overt LOB noted. Min assist to power-up to full stand.  Ambulation/Gait Ambulation/Gait assistance: Min guard Gait Distance (Feet): 250 Feet Assistive device: Straight cane Gait Pattern/deviations: Step-through pattern;Decreased stride length;Trunk flexed Gait velocity: Decreased Gait velocity interpretation: <1.31 ft/sec, indicative of household ambulator General Gait Details: VC's for improved posture. Generally flexed knees throughout gait cycle due to weakness. No overt LOB noted.  Stairs Stairs: Yes Stairs assistance: Min guard Stair Management: One rail Right;Step to pattern;Forwards;With cane Number of Stairs: 10 General stair comments: VC's for sequencing and general safety. No assist required however hands on guarding provided for safety throughout.  Wheelchair Mobility    Modified Rankin (Stroke Patients Only)       Balance Overall balance assessment: Needs assistance Sitting-balance support: Feet supported Sitting balance-Leahy Scale: Fair     Standing balance support: No upper extremity supported Standing balance-Leahy Scale: Fair                               Pertinent Vitals/Pain Pain Assessment: Faces Pain Score: 2  Faces Pain Scale: Hurts a little bit Pain Location: L lower back and incision site Pain Descriptors / Indicators: Discomfort;Operative site guarding;Sore Pain Intervention(s): Limited activity within patient's tolerance;Monitored during session;Repositioned    Home Living Family/patient expects to be discharged to:: Private residence Living Arrangements: Spouse/significant other Available Help at Discharge: Family;Available 24 hours/day Type of Home: House Home Access: Stairs to enter Entrance Stairs-Rails: None Entrance Stairs-Number of Steps:  2 Home Layout: Multi-level;Bed/bath upstairs  Home Equipment: Cane - single point;Grab bars - tub/shower Additional Comments: Lives with husband, 2 child live out of state, 1 lives in [place]; supportive family and friends    Prior Function Level of Independence: Independent with assistive device(s)         Comments: uses cane when she feels like she needs extra support.     Hand Dominance   Dominant Hand: Right    Extremity/Trunk Assessment   Upper Extremity Assessment Upper Extremity Assessment: Defer to OT evaluation    Lower Extremity Assessment Lower Extremity Assessment: Generalized weakness    Cervical / Trunk Assessment Cervical / Trunk Assessment: Other exceptions Cervical / Trunk Exceptions: s/p back sx  Communication   Communication: No difficulties  Cognition Arousal/Alertness: Awake/alert Behavior During Therapy: WFL for tasks assessed/performed Overall Cognitive Status: Within Functional Limits for tasks assessed                                        General Comments General comments (skin integrity, edema, etc.): VSS on RA    Exercises     Assessment/Plan    PT Assessment Patient needs continued PT services  PT Problem List Decreased strength;Decreased activity tolerance;Decreased balance;Decreased mobility;Decreased knowledge of use of DME;Decreased safety awareness;Decreased knowledge of precautions;Pain       PT Treatment Interventions DME instruction;Gait training;Stair training;Functional mobility training;Therapeutic activities;Therapeutic exercise;Patient/family education    PT Goals (Current goals can be found in the Care Plan section)  Acute Rehab PT Goals Patient Stated Goal: Get back to exercising - wants to use the recumbent bike PT Goal Formulation: With patient Time For Goal Achievement: 08/28/21 Potential to Achieve Goals: Good    Frequency Min 5X/week   Barriers to discharge        Co-evaluation                AM-PAC PT "6 Clicks" Mobility  Outcome Measure Help needed turning from your back to your side while in a flat bed without using bedrails?: A Little Help needed moving from lying on your back to sitting on the side of a flat bed without using bedrails?: A Little Help needed moving to and from a bed to a chair (including a wheelchair)?: A Little Help needed standing up from a chair using your arms (e.g., wheelchair or bedside chair)?: A Little Help needed to walk in hospital room?: A Little Help needed climbing 3-5 steps with a railing? : A Little 6 Click Score: 18    End of Session Equipment Utilized During Treatment: Gait belt Activity Tolerance: Patient tolerated treatment well Patient left: in bed;with call bell/phone within reach;with family/visitor present (Sitting EOB) Nurse Communication: Mobility status PT Visit Diagnosis: Unsteadiness on feet (R26.81);Pain Pain - part of body:  (back)    Time: 8527-7824 PT Time Calculation (min) (ACUTE ONLY): 23 min   Charges:   PT Evaluation $PT Eval Low Complexity: 1 Low PT Treatments $Gait Training: 8-22 mins        Conni Slipper, PT, DPT Acute Rehabilitation Services Pager: [phone] Office: [phone]   Marylynn Pearson 08/21/2021, 11:02 AM

## 2021-08-21 NOTE — Evaluation (Signed)
Occupational Therapy Evaluation Patient Details Name: Brenda Ortiz MRN: [id number] DOB: December 13, 1940 Today's Date: 08/21/2021   History of Present Illness Brenda Ortiz, 80 y.o. female who is s/p uncomplicated right T12-L1 MIS discectomy on 10/5 after refractory proximal right lower extremity and abdominal radicular pain, MRI showed a corresponding disc herniation and and nerve root compression at T12-L1. PMHx: arthritis, hypothyroidism, UTI, L THA   Clinical Impression   Brenda Ortiz was mod I with use of a SPC prior to the above back surgery, she lives in a  3 level home with her supportive husband, bed/bath on 2nd level. Upon evaluation pt demonstrated great ability to use compensatory techniques to maintain back precautions during ADLs with min guard A. Pt does not require further OT; recommend d/c to home with 24/7 support initially.      Recommendations for follow up therapy are one component of a multi-disciplinary discharge planning process, led by the attending physician.  Recommendations may be updated based on patient status, additional functional criteria and insurance authorization.   Follow Up Recommendations  No OT follow up;Supervision/Assistance - 24 hour    Equipment Recommendations  None recommended by OT       Precautions / Restrictions Precautions Precautions: Back;Fall Precaution Booklet Issued: Yes (comment) Restrictions Weight Bearing Restrictions: No      Mobility Bed Mobility Overal bed mobility: Needs Assistance Bed Mobility: Rolling;Sidelying to Sit;Sit to Sidelying Rolling: Min guard Sidelying to sit: Min guard     Sit to sidelying: Min guard General bed mobility comments: cueing for log roll    Transfers Overall transfer level: Needs assistance Equipment used: None Transfers: Sit to/from Stand Sit to Stand: Min guard         General transfer comment: Pt slightly unsteady upon standing    Balance Overall balance assessment:  Needs assistance Sitting-balance support: Feet supported Sitting balance-Leahy Scale: Fair     Standing balance support: No upper extremity supported Standing balance-Leahy Scale: Fair                             ADL either performed or assessed with clinical judgement   ADL Overall ADL's : Needs assistance/impaired Eating/Feeding: Independent;Sitting   Grooming: Supervision/safety;Standing;Cueing for compensatory techniques   Upper Body Bathing: Supervision/ safety;Cueing for compensatory techniques;Sitting   Lower Body Bathing: Min guard;Cueing for back precautions;Sit to/from stand   Upper Body Dressing : Set up;Sitting   Lower Body Dressing: Min guard;Cueing for back precautions;Cueing for compensatory techniques;Sit to/from stand   Toilet Transfer: Min guard;Ambulation;Comfort height toilet;Grab bars   Toileting- Clothing Manipulation and Hygiene: Supervision/safety;Sitting/lateral lean;Adhering to back precautions;Cueing for compensatory techniques       Functional mobility during ADLs: Min guard;Cueing for safety General ADL Comments: Pt demonstrated good ability to complete ADLs with compensatory techniques to maintain back precautions     Vision Baseline Vision/History: 1 Wears glasses Patient Visual Report: No change from baseline Vision Assessment?: No apparent visual deficits     Perception     Praxis      Pertinent Vitals/Pain Pain Assessment: 0-10 Pain Score: 2  Pain Descriptors / Indicators: Discomfort Pain Intervention(s): Monitored during session;Limited activity within patient's tolerance;Ice applied     Hand Dominance     Extremity/Trunk Assessment Upper Extremity Assessment Upper Extremity Assessment: Overall WFL for tasks assessed   Lower Extremity Assessment Lower Extremity Assessment: Defer to PT evaluation   Cervical / Trunk Assessment Cervical / Trunk Assessment: Other exceptions Cervical /  Trunk Exceptions: s/p back  sx   Communication Communication Communication: No difficulties   Cognition Arousal/Alertness: Awake/alert Behavior During Therapy: WFL for tasks assessed/performed Overall Cognitive Status: Within Functional Limits for tasks assessed                                     General Comments  VSS on RA    Exercises     Shoulder Instructions      Home Living Family/patient expects to be discharged to:: Private residence Living Arrangements: Spouse/significant other Available Help at Discharge: Family;Available 24 hours/day Type of Home: House Home Access: Stairs to enter [employer] of Steps: 2   Home Layout: Multi-level;Bed/bath upstairs     Bathroom Shower/Tub: Tub/shower unit;Door   [employer] Toilet: Handicapped height     Home Equipment: Cane - single point;Grab bars - tub/shower   Additional Comments: Lives with husband, 2 child live out of state, 1 lives in [place]; supportive family and friends      Prior Functioning/Environment Level of Independence: Independent with assistive device(s)        Comments: uses cane when she feels like she needs extra support.        OT Problem List: Decreased range of motion;Decreased activity tolerance;Impaired balance (sitting and/or standing);Decreased knowledge of use of DME or AE;Decreased safety awareness;Decreased knowledge of precautions;Pain      OT Treatment/Interventions:      OT Goals(Current goals can be found in the care plan section) Acute Rehab OT Goals Patient Stated Goal: home soon OT Goal Formulation: With patient  OT Frequency:     Barriers to D/C:            Co-evaluation              AM-PAC OT "6 Clicks" Daily Activity     Outcome Measure Help from another person eating meals?: None Help from another person taking care of personal grooming?: A Little Help from another person toileting, which includes using toliet, bedpan, or urinal?: A Little Help from another  person bathing (including washing, rinsing, drying)?: A Little Help from another person to put on and taking off regular upper body clothing?: None Help from another person to put on and taking off regular lower body clothing?: A Little 6 Click Score: 20   End of Session Nurse Communication: Mobility status  Activity Tolerance: Patient tolerated treatment well Patient left: in bed;with call bell/phone within reach  OT Visit Diagnosis: Other abnormalities of gait and mobility (R26.89);Muscle weakness (generalized) (M62.81);Pain                Time: 1610-9604 OT Time Calculation (min): 22 min Charges:  OT General Charges $OT Visit: 1 Visit OT Evaluation $OT Eval Low Complexity: 1 Low   Debria Broecker A Jasenia Weilbacher 08/21/2021, 8:40 AM

## 2021-08-21 NOTE — Discharge Summary (Signed)
Discharge Summary  Date of Admission: 08/20/2021  Date of Discharge: 08/21/21  Attending Physician: Autumn Patty, MD  Hospital Course: Patient was admitted following an uncomplicated right T12-L1 MIS discectomy. She was recovered in PACU and transferred to [facility] Her hospital course was uncomplicated and the patient was discharged home on 08/21/21. She will follow up in clinic with me in 2 weeks.  Neurologic exam at discharge:  Strength 5/5 x4, SILTx4  Discharge diagnosis: Lumbar radiculopathy  Jadene Pierini, MD 08/21/21 7:44 AM

## 2021-08-22 ENCOUNTER — Encounter: Payer: Medicare PPO | Admitting: Rehabilitative and Restorative Service Providers"

## 2021-08-27 ENCOUNTER — Encounter: Payer: Medicare PPO | Admitting: Physical Therapy

## 2021-08-29 ENCOUNTER — Encounter: Payer: Medicare PPO | Admitting: Rehabilitative and Restorative Service Providers"

## 2021-09-12 ENCOUNTER — Ambulatory Visit (INDEPENDENT_AMBULATORY_CARE_PROVIDER_SITE_OTHER): Payer: Medicare PPO | Admitting: Physical Therapy

## 2021-09-12 ENCOUNTER — Encounter: Payer: Self-pay | Admitting: Physical Therapy

## 2021-09-12 ENCOUNTER — Other Ambulatory Visit: Payer: Self-pay

## 2021-09-12 DIAGNOSIS — M25551 Pain in right hip: Secondary | ICD-10-CM | POA: Diagnosis not present

## 2021-09-12 DIAGNOSIS — R2681 Unsteadiness on feet: Secondary | ICD-10-CM | POA: Diagnosis not present

## 2021-09-12 DIAGNOSIS — M5441 Lumbago with sciatica, right side: Secondary | ICD-10-CM

## 2021-09-12 DIAGNOSIS — R262 Difficulty in walking, not elsewhere classified: Secondary | ICD-10-CM | POA: Diagnosis not present

## 2021-09-12 DIAGNOSIS — M6281 Muscle weakness (generalized): Secondary | ICD-10-CM

## 2021-09-12 NOTE — Patient Instructions (Signed)
Access Code: [email or web address] URL: [email or web address] Date: 09/12/2021 Prepared by: Moshe Cipro  Exercises Hooklying Single Knee to Chest Stretch - 2 x daily - 3-5 reps - 10 seconds hold Supine Bridge - 2 x daily - 2 sets - 10 reps - 5 seconds hold Supine Posterior Pelvic Tilt - 2 x daily - 7 x weekly - 1 sets - 10 reps - 5 sec hold Hooklying Hamstring Stretch with Strap - 2 x daily - 3 reps - 30 seconds hold Sit to Stand - 2 x daily - 7 x weekly - 1 sets - 10 reps

## 2021-09-12 NOTE — Therapy (Addendum)
[facility] [place], [place], [address] Phone: [phone]   Fax:  [phone]  Physical Therapy Evaluation  Patient Details  Name: Brenda Ortiz MRN: [id number] Date of Birth: [birth year] Referring Provider (PT): Emelda Brothers, MD   Encounter Date: 09/12/2021  Referring diagnosis? M25.559 Treatment diagnosis? (if different than referring diagnosis) M54.41, R26.2, M62.81, R26.81, M25.551 What was this (referring dx) caused by? _0  Surgery _1  Fall _2  Ongoing issue _3  Arthritis _4  Other: ____________  Laterality: _5  Rt _6  Lt _7  Both  Check all possible CPT codes:      _8  97110 (Therapeutic Exercise)  _9  92507 (SLP Treatment)  _10  31497 (Neuro Re-ed)   _11  02637 (Swallowing Treatment)   _12  85885 (Gait Training)   _13  02774 (Cognitive Training, 1st 15 minutes) _14  97140 (Manual Therapy)   _15  97130 (Cognitive Training, each add'l 15 minutes)  _16  97530 (Therapeutic Activities)  _17  Other, List CPT Code ____________    _18  12878 (Self Care)       _19  All codes above (97110 - 97535)  _20  97012 (Mechanical Traction)  _21  97014 (E-stim Unattended)  _22  97032 (E-stim manual)  _23  97033 (Ionto)  _24  97035 (Ultrasound)  _25  97760 (Orthotic Fit) _26  97750 (Physical Performance Training) _27  [id number] (Aquatic Therapy) _28  67672 (Contrast Bath) _29  09470 (Paraffin) _30  97597 (Wound Care 1st 20 sq cm) _31  97598 (Wound Care each add'l 20 sq cm) _32  97016 (Vasopneumatic Device) _33  96283 (Orthotic Training) _34  66294 (Prosthetic Training)    PT End of Session - 09/12/21 1009     Visit Number 1    Number of Visits 12    Date for PT Re-Evaluation 11/07/21    Progress Note Due on Visit 10    PT Start Time 0933    PT Stop Time 1007    PT Time Calculation (min) 34 min    Activity Tolerance Patient tolerated treatment well    Behavior During Therapy [facility] for tasks assessed/performed             Past Medical History:  Diagnosis Date    Arthritis    Herniated lumbar intervertebral disc 2022   Hypothyroidism    Incontinence    Seasonal allergies    UTI (urinary tract infection) 06/2021    Past Surgical History:  Procedure Laterality Date   DILATION AND CURETTAGE OF UTERUS     x2   FOOT SURGERY Right    HYSTEROSCOPY WITH D & C N/A 08/14/2016   Procedure: DILATATION AND CURETTAGE /HYSTEROSCOPY;  Surgeon: Brien Few, MD;  Location: [facility];  Service: Gynecology;  Laterality: N/A;   JOINT REPLACEMENT Left 02/03/2020   LUMBAR LAMINECTOMY/ DECOMPRESSION WITH MET-RX Right 08/20/2021   Procedure: Right Thoracic 12-Lumbar 1 Minimally invasive discectomy with metrx;  Surgeon: Judith Part, MD;  Location: [facility];  Service: Neurosurgery;  Laterality: Right;  3C/RM 19   TONSILLECTOMY     TOTAL HIP ARTHROPLASTY Left 02/03/2020   Procedure: LEFT TOTAL HIP ARTHROPLASTY ANTERIOR APPROACH;  Surgeon: Mcarthur Rossetti, MD;  Location: [facility];  Service: Orthopedics;  Laterality: Left;    There were no vitals filed for this visit.    Subjective Assessment - 09/12/21 7654     Subjective Waymon Budge, 80 y.o. female who is s/p uncomplicated right Y50-P5 MIS discectomy on 10/5 after refractory proximal right lower extremity and abdominal radicular pain, MRI showed a corresponding disc herniation and and nerve root compression at T12-L1.  She complains of continued Rt hip  pain.    Pertinent History arthritis, hypothyroidism, foot surgery on right, Left THA 2021    Limitations Standing;Walking    How long can you stand comfortably? hurts with initial standing    How long can you walk comfortably? okay once she gets moving    Patient Stated Goals walk normal, improve pain    Currently in Pain? Yes    Pain Score 2    up to 5/10; at best 0/10   Pain Location Hip    Pain Orientation Right    Pain Descriptors / Indicators Discomfort    Pain Type Acute pain;Surgical pain    Pain Radiating Towards RLE to knee    Pain  Onset 1 to 4 weeks ago    Pain Frequency Intermittent    Aggravating Factors  transitional movements, standing    Pain Relieving Factors repositioning, walking                [facility] PT Assessment - 09/12/21 0940       Assessment   Medical Diagnosis M25.559 hip pain    Referring Provider (PT) Emelda Brothers, MD    Onset Date/Surgical Date 08/20/21    Hand Dominance Right    Next MD Visit Dec 2022    Prior Therapy many episodes of care here      Precautions   Precautions Fall      Restrictions   Weight Bearing Restrictions No      Balance Screen   Has the patient fallen in the past 6 months No    Has the patient had a decrease in activity level because of a fear of falling?  Yes    Is the patient reluctant to leave their home because of a fear of falling?  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 ([employer] and [employer])    Leisure walking in neighborhood      Cognition   Overall Cognitive Status Within Functional Limits for tasks assessed      Observation/Other Assessments   Observations mild Lt hand tremor noted with ambulation; pt also report micrographia    Focus on Therapeutic Outcomes (FOTO)  55 (predicted 70)      Posture/Postural Control   Posture/Postural Control Postural limitations    Postural Limitations Rounded Shoulders;Forward head;Decreased lumbar lordosis      Strength   Right Hip Flexion 3+/5     Right Hip ABduction 4/5    Right Hip ADduction 4/5    Left Hip Flexion 3+/5    Left Hip ABduction 4/5    Left Hip ADduction 4/5    Right Knee Flexion 5/5    Right Knee Extension 5/5    Left Knee Flexion 5/5    Left Knee Extension 5/5      Transfers   Five time sit to stand comments  29.97 sec with UE support      Ambulation/Gait   Ambulation Distance (Feet) 100 Feet    Assistive device Straight cane    Gait Pattern Step-to pattern;Decreased stance time - right;Decreased step length - right;Decreased step length - left;Decreased stride length;Decreased weight shift to right;Antalgic;Poor foot clearance - left    Gait velocity 1.5 ft/sec                        Objective measurements completed on examination: See above findings.       [facility] Adult PT Treatment/Exercise - 09/12/21 0940       Exercises   Exercises Other Exercises    Other Exercises  see pt instructions - reviewed prior HEP and updated.  Pt verbalized understanding                     PT Education - 09/12/21 1008     Education Details HEP    Person(s) Educated Patient    Methods Explanation;Demonstration;Handout    Comprehension Verbalized understanding;Returned demonstration;Need further instruction              PT Short Term Goals - 09/12/21 1010       PT SHORT TERM GOAL #1   Title Patient will be able to independently initiate HEP    Baseline -    Time 3    Period Weeks    Status New    Target Date 10/03/21      PT SHORT TERM GOAL #2   Title n/a    Baseline -      PT SHORT TERM GOAL #3   Title -               PT Long Term Goals - 09/12/21 1011       PT LONG TERM GOAL #1   Title Pt will be independent in her HEP and progression.    Baseline -    Time 6    Period Weeks    Status New    Target Date 10/24/21      PT LONG TERM GOAL #2   Title Pt will be able to perform 5x sit to stand in </= 20 seconds with/without UE support.    Time 6    Period  Weeks    Status New    Target Date 10/24/21      PT LONG TERM GOAL #3   Title Pt will be able to amb with no assistive device on level surfaces with pain </= 3/10.    Baseline -    Time 6    Period Weeks    Status New    Target Date 10/24/21      PT LONG TERM GOAL #4   Title Pt will improve her FOTO to >/= 70%    Baseline -    Time 6    Period Weeks    Status New    Target Date 10/24/21      PT LONG TERM GOAL #5   Title improve gait velocity to > 2.0 ft/sec for improved mobility and decreased fall risk    Time 6    Period Weeks    Status New    Target Date 10/24/21                    Plan - 09/12/21 1009     Clinical Impression Statement Pt is a 80 y.o. female who is  s/p uncomplicated right V81-M4 MIS discectomy on 10/4 after refractory proximal right lower extremity and abdominal radicular pain.  Pt demonstrates decreased strength and balance as well as gait abnormalities affecting functional mobility.  Pt will benefit from PT to address deficits listed.    Personal Factors and Comorbidities Comorbidity 3+    Comorbidities arthritis, hypothyroidism, foot surgery on right, Left THA 2021    Examination-Activity Limitations Stand;Stairs;Squat;Sleep;Sit;Transfers    Examination-Participation Restrictions Other;Community Activity;Yard Work;Driving    Stability/Clinical Decision Making Evolving/Moderate complexity    Clinical Decision Making Moderate    Rehab Potential Fair    PT Frequency 2x / week    PT Duration 6 weeks    PT Treatment/Interventions ADLs/Self Care Home Management;Cryotherapy;Electrical Stimulation;Iontophoresis 82m/ml Dexamethasone;Moist Heat;Traction;Ultrasound;Balance training;Therapeutic exercise;Therapeutic activities;Functional mobility training;Stair training;Gait training;Neuromuscular re-education;Patient/family education;Passive range of motion;Manual techniques;Dry needling;Taping;Spinal Manipulations;DME Instruction    PT Next Visit Plan  Nustep, general balance and strengthening; modalities/manual PRN    PT Home Exercise Plan Access Code: [email or web address]    Consulted and Agree with Plan of Care Patient             Patient will benefit from skilled therapeutic intervention in order to improve the following deficits and impairments:  Pain, Difficulty walking, Decreased activity tolerance, Postural dysfunction, Decreased strength, Decreased mobility, Impaired flexibility, Decreased balance, Abnormal gait  Visit Diagnosis: Acute right-sided low back pain with right-sided sciatica - Plan: PT plan of care cert/re-cert  Difficulty walking - Plan: PT plan of care cert/re-cert  Muscle weakness (generalized) - Plan: PT plan of care cert/re-cert  Unsteadiness on feet - Plan: PT plan of care cert/re-cert  Pain in right hip - Plan: PT plan of care cert/re-cert     Problem List Patient Active Problem List   Diagnosis Date Noted   Lumbar radiculopathy 08/20/2021   Status post total replacement of left hip 02/03/2020   Unilateral primary osteoarthritis, left hip 12/01/2019   Low back pain 10/27/2019   Right hip pain 07/14/2017   Chest pain 06/11/2015   Hypothyroidism 06/11/2015      SLaureen Abrahams PT, DPT 09/12/21 12:11 PM     [facility] [address] [address]Phone: [phone]  Fax:  [phone] Name: MQUILLA FREEZEMRN: [id number] of Birth: 106-19-1942

## 2021-09-20 ENCOUNTER — Ambulatory Visit: Payer: Medicare PPO | Admitting: Rehabilitative and Restorative Service Providers"

## 2021-09-20 ENCOUNTER — Other Ambulatory Visit: Payer: Self-pay

## 2021-09-20 ENCOUNTER — Encounter: Payer: Self-pay | Admitting: Rehabilitative and Restorative Service Providers"

## 2021-09-20 DIAGNOSIS — M5441 Lumbago with sciatica, right side: Secondary | ICD-10-CM

## 2021-09-20 DIAGNOSIS — M6281 Muscle weakness (generalized): Secondary | ICD-10-CM

## 2021-09-20 DIAGNOSIS — R2681 Unsteadiness on feet: Secondary | ICD-10-CM

## 2021-09-20 DIAGNOSIS — M25551 Pain in right hip: Secondary | ICD-10-CM

## 2021-09-20 DIAGNOSIS — R262 Difficulty in walking, not elsewhere classified: Secondary | ICD-10-CM | POA: Diagnosis not present

## 2021-09-20 NOTE — Patient Instructions (Addendum)
Access Code: [email or web address] URL: [email or web address] Date: 09/20/2021 Prepared by: Pauletta Browns  Exercises Single Knee to Chest Stretch - 2 x daily - 3-5 reps - 10 seconds hold Supine Bridge - 2 x daily - 2 sets - 10 reps - 5 seconds hold Hamstring Stretch with/without Strap - 2 x daily - 3 reps - 30 seconds hold Sit to Stand - 2 x daily - 7 x weekly - 1 sets - 10 reps Standing Scapular Retraction - 5 x daily - 7 x weekly - 1 sets - 5 reps - 5 second hold Standing Lumbar Extension at Wall - Forearms - 5 x daily - 7 x weekly - 1 sets - 5 reps - 3 seconds hold Tandem Stance - 2 x daily - 7 x weekly - 1 sets - 4-5 reps - 20 second hold Standing Hip Hiking - 2 x daily - 7 x weekly - 2 sets - 10 reps - 3 seconds hold

## 2021-09-20 NOTE — Therapy (Signed)
[facility] Physical Therapy [address] [place], [place], [address] Phone: [phone]   Fax:  [phone]  Physical Therapy Treatment  Patient Details  Name: Brenda Ortiz MRN: [id number] Date of Birth: 23-Sep-1941 Referring Provider (PT): Emelda Brothers, MD   Encounter Date: 09/20/2021   PT End of Session - 09/20/21 0853     Visit Number 2    Number of Visits 12    Date for PT Re-Evaluation 11/07/21    Progress Note Due on Visit 10    PT Start Time 0801    PT Stop Time 1884    PT Time Calculation (min) 41 min    Activity Tolerance Patient tolerated treatment well;No increased pain    Behavior During Therapy [facility] for tasks assessed/performed             Past Medical History:  Diagnosis Date   Arthritis    Herniated lumbar intervertebral disc 2022   Hypothyroidism    Incontinence    Seasonal allergies    UTI (urinary tract infection) 06/2021    Past Surgical History:  Procedure Laterality Date   DILATION AND CURETTAGE OF UTERUS     x2   FOOT SURGERY Right    HYSTEROSCOPY WITH D & C N/A 08/14/2016   Procedure: DILATATION AND CURETTAGE /HYSTEROSCOPY;  Surgeon: Brien Few, MD;  Location: [facility];  Service: Gynecology;  Laterality: N/A;   JOINT REPLACEMENT Left 02/03/2020   LUMBAR LAMINECTOMY/ DECOMPRESSION WITH MET-RX Right 08/20/2021   Procedure: Right Thoracic 12-Lumbar 1 Minimally invasive discectomy with metrx;  Surgeon: Judith Part, MD;  Location: [facility];  Service: Neurosurgery;  Laterality: Right;  3C/RM 19   TONSILLECTOMY     TOTAL HIP ARTHROPLASTY Left 02/03/2020   Procedure: LEFT TOTAL HIP ARTHROPLASTY ANTERIOR APPROACH;  Surgeon: Mcarthur Rossetti, MD;  Location: [facility];  Service: Orthopedics;  Laterality: Left;    There were no vitals filed for this visit.   Subjective Assessment - 09/20/21 0849     Subjective Brenda Ortiz notes R groin, lateral hip and gluteal pain, particularly with sit to stand.     Pertinent History arthritis, hypothyroidism, foot surgery on right, Left THA 2021    Limitations Standing;Walking    How long can you stand comfortably? hurts with initial standing    How long can you walk comfortably? okay once she gets moving    Patient Stated Goals walk normal, improve pain    Currently in Pain? Yes    Pain Score 3     Pain Location Hip    Pain Orientation Right    Pain Descriptors / Indicators Discomfort    Pain Type Surgical pain;Acute pain    Pain Radiating Towards To the R knee    Pain Onset More than a month ago    Pain Frequency Intermittent    Aggravating Factors  Sit to stand and prolonged postures    Pain Relieving Factors Walking and day 1 HEP    Effect of Pain on Daily Activities Limited standing and walking endurance.  Not able to take long car trips or do household chores as efficiently as she would like.    Multiple Pain Sites No                               [facility] Adult PT Treatment/Exercise - 09/20/21 0001       Neuro Re-ed    Neuro Re-ed Details  Feet together  eyes closed and wider tandem balance eyes open 3X 30 seconds each (started feet together eyes open-too easy)      Exercises   Exercises Lumbar      Lumbar Exercises: Stretches   Active Hamstring Stretch Left;Right;3 reps;30 seconds;Limitations    Active Hamstring Stretch Limitations 90/90 strap is optional other leg straight    Single Knee to Chest Stretch Left;Right;4 reps;20 seconds;Limitations    Single Knee to Chest Stretch Limitations Other leg straight    Other Lumbar Stretch Exercise Trunk extension AROM (hips forward) 10X 3 seconds      Lumbar Exercises: Standing   Scapular Retraction Strengthening;Both;10 reps;Limitations    Scapular Retraction Limitations 5 seconds (shoulder blade pinches)    Other Standing Lumbar Exercises Alternating hip hike in doorway 2 sets of 10 for 3 seconds      Lumbar Exercises: Seated   Sit to Stand 10 reps;Limitations     Sit to Stand Limitations Slow eccentrics, hands PRN, SBP and trunk extension at the top      Lumbar Exercises: Supine   Bridge Non-compliant;10 reps;5 seconds                     PT Education - 09/20/21 0852     Education Details Reviewed day 1 HEP.  Encouraged Brenda Ortiz to straighten the other leg with knee to chest and hamstrings stretch.  Added 4 new postural/strength/balance activities.    Person(s) Educated Patient    Methods Explanation;Demonstration;Tactile cues;Verbal cues;Handout    Comprehension Verbalized understanding;Tactile cues required;Need further instruction;Returned demonstration;Verbal cues required              PT Short Term Goals - 09/12/21 1010       PT SHORT TERM GOAL #1   Title Patient will be able to independently initiate HEP    Baseline -    Time 3    Period Weeks    Status New    Target Date 10/03/21      PT SHORT TERM GOAL #2   Title n/a    Baseline -      PT SHORT TERM GOAL #3   Title -               PT Long Term Goals - 09/12/21 1011       PT LONG TERM GOAL #1   Title Pt will be independent in her HEP and progression.    Baseline -    Time 6    Period Weeks    Status New    Target Date 10/24/21      PT LONG TERM GOAL #2   Title Pt will be able to perform 5x sit to stand in </= 20 seconds with/without UE support.    Time 6    Period Weeks    Status New    Target Date 10/24/21      PT LONG TERM GOAL #3   Title Pt will be able to amb with no assistive device on level surfaces with pain </= 3/10.    Baseline -    Time 6    Period Weeks    Status New    Target Date 10/24/21      PT LONG TERM GOAL #4   Title Pt will improve her FOTO to >/= 70%    Baseline -    Time 6    Period Weeks    Status New    Target Date 10/24/21  PT LONG TERM GOAL #5   Title improve gait velocity to > 2.0 ft/sec for improved mobility and decreased fall risk    Time 6    Period Weeks    Status New    Target Date 10/24/21                    Plan - 09/20/21 0854     Clinical Impression Statement Brenda Ortiz has balance, AROM, strength and functional impairments in need of skilled care.  Encouraged her to straighten her other leg with knee to chesta nd hamstrings stretch to help maintain lordosis while stretching.  She did a good job with bridging, sit to stand and other day 1 activities.  Progressed scapular retraction, lumbar gentle extension AROM, tandem balance and hip hiking for quadratus lumborum and hip abductors strengthening.  Brenda Ortiz will benefit from continued core, hip, leg strengthening along with balance and body mechanics work to meet long-term goals.    Personal Factors and Comorbidities Comorbidity 3+    Comorbidities arthritis, hypothyroidism, foot surgery on right, Left THA 2021    Examination-Activity Limitations Stand;Stairs;Squat;Sleep;Sit;Transfers    Examination-Participation Restrictions Other;Community Activity;Yard Work;Driving    Stability/Clinical Decision Making Evolving/Moderate complexity    Rehab Potential Fair    PT Frequency 2x / week    PT Duration 6 weeks    PT Treatment/Interventions ADLs/Self Care Home Management;Cryotherapy;Electrical Stimulation;Iontophoresis 52m/ml Dexamethasone;Moist Heat;Traction;Ultrasound;Balance training;Therapeutic exercise;Therapeutic activities;Functional mobility training;Stair training;Gait training;Neuromuscular re-education;Patient/family education;Passive range of motion;Manual techniques;Dry needling;Taping;Spinal Manipulations;DME Instruction    PT Next Visit Plan Low back, hip abductors and general leg strength, body mechanics, balance and functional activities to improve endurance and comfort with ADLs.    PT Home Exercise Plan Access Code: [email or web address]    Consulted and Agree with Plan of Care Patient             Patient will benefit from skilled therapeutic intervention in order to improve the following deficits and impairments:   Pain, Difficulty walking, Decreased activity tolerance, Postural dysfunction, Decreased strength, Decreased mobility, Impaired flexibility, Decreased balance, Abnormal gait  Visit Diagnosis: Difficulty walking  Muscle weakness (generalized)  Unsteadiness on feet  Acute right-sided low back pain with right-sided sciatica  Pain in right hip     Problem List Patient Active Problem List   Diagnosis Date Noted   Lumbar radiculopathy 08/20/2021   Status post total replacement of left hip 02/03/2020   Unilateral primary osteoarthritis, left hip 12/01/2019   Low back pain 10/27/2019   Right hip pain 07/14/2017   Chest pain 06/11/2015   Hypothyroidism 06/11/2015    RFarley Ly PT, MPT 09/20/2021, 8:58 AM  [facility] [address] [address]Phone: [phone]  Fax:  [phone] Name: Brenda SELTZERMRN: [id number] of Birth: 110/29/1942

## 2021-09-25 ENCOUNTER — Ambulatory Visit: Payer: Medicare PPO | Admitting: Physical Therapy

## 2021-09-25 ENCOUNTER — Other Ambulatory Visit: Payer: Self-pay

## 2021-09-25 ENCOUNTER — Encounter: Payer: Self-pay | Admitting: Physical Therapy

## 2021-09-25 DIAGNOSIS — M6281 Muscle weakness (generalized): Secondary | ICD-10-CM

## 2021-09-25 DIAGNOSIS — M25551 Pain in right hip: Secondary | ICD-10-CM | POA: Diagnosis not present

## 2021-09-25 DIAGNOSIS — R2681 Unsteadiness on feet: Secondary | ICD-10-CM | POA: Diagnosis not present

## 2021-09-25 DIAGNOSIS — R262 Difficulty in walking, not elsewhere classified: Secondary | ICD-10-CM

## 2021-09-25 DIAGNOSIS — M5441 Lumbago with sciatica, right side: Secondary | ICD-10-CM | POA: Diagnosis not present

## 2021-09-25 NOTE — Therapy (Signed)
[facility] [address] [place], [place], [address] Phone: [phone]   Fax:  [phone]  Physical Therapy Treatment  Patient Details  Name: Brenda Ortiz MRN: [id number] Date of Birth: 02-Aug-1941 Referring Provider (PT): Emelda Brothers, MD   Encounter Date: 09/25/2021   PT End of Session - 09/25/21 1548     Visit Number 3    Number of Visits 12    Date for PT Re-Evaluation 11/07/21    Progress Note Due on Visit 10    PT Start Time 1540    PT Stop Time 0867    PT Time Calculation (min) 40 min    Activity Tolerance Patient tolerated treatment well;No increased pain    Behavior During Therapy [facility] for tasks assessed/performed             Past Medical History:  Diagnosis Date   Arthritis    Herniated lumbar intervertebral disc 2022   Hypothyroidism    Incontinence    Seasonal allergies    UTI (urinary tract infection) 06/2021    Past Surgical History:  Procedure Laterality Date   DILATION AND CURETTAGE OF UTERUS     x2   FOOT SURGERY Right    HYSTEROSCOPY WITH D & C N/A 08/14/2016   Procedure: DILATATION AND CURETTAGE /HYSTEROSCOPY;  Surgeon: Brien Few, MD;  Location: [facility];  Service: Gynecology;  Laterality: N/A;   JOINT REPLACEMENT Left 02/03/2020   LUMBAR LAMINECTOMY/ DECOMPRESSION WITH MET-RX Right 08/20/2021   Procedure: Right Thoracic 12-Lumbar 1 Minimally invasive discectomy with metrx;  Surgeon: Judith Part, MD;  Location: [facility];  Service: Neurosurgery;  Laterality: Right;  3C/RM 19   TONSILLECTOMY     TOTAL HIP ARTHROPLASTY Left 02/03/2020   Procedure: LEFT TOTAL HIP ARTHROPLASTY ANTERIOR APPROACH;  Surgeon: Mcarthur Rossetti, MD;  Location: [facility];  Service: Orthopedics;  Laterality: Left;    There were no vitals filed for this visit.   Subjective Assessment - 09/25/21 1548     Subjective still having some Rt ant hip pain    Pertinent History arthritis, hypothyroidism, foot surgery  on right, Left THA 2021    Limitations Standing;Walking    How long can you stand comfortably? hurts with initial standing    How long can you walk comfortably? okay once she gets moving    Patient Stated Goals walk normal, improve pain    Currently in Pain? Yes    Pain Score 3     Pain Location Hip    Pain Orientation Right    Pain Descriptors / Indicators Discomfort    Pain Type Acute pain;Surgical pain    Pain Onset More than a month ago    Pain Frequency Intermittent    Aggravating Factors  sit to stand, prolonged positioning    Pain Relieving Factors walking and HEP                               [facility] Adult PT Treatment/Exercise - 09/25/21 1516       Lumbar Exercises: Stretches   Single Knee to Chest Stretch Right;Left;3 reps;30 seconds    Single Knee to Chest Stretch Limitations Other leg straight    Hip Flexor Stretch Right;3 reps;30 seconds    Piriformis Stretch Right;Left;3 reps;30 seconds      Lumbar Exercises: Aerobic   Nustep L5 x 8 min      Lumbar Exercises: Supine   Clam 10 reps  Clam Limitations single limb with L4 band    Bridge 10 reps;5 seconds    Bridge Limitations with L4 band - isometric hip abdct                       PT Short Term Goals - 09/12/21 1010       PT SHORT TERM GOAL #1   Title Patient will be able to independently initiate HEP    Baseline -    Time 3    Period Weeks    Status New    Target Date 10/03/21      PT SHORT TERM GOAL #2   Title n/a    Baseline -      PT SHORT TERM GOAL #3   Title -               PT Long Term Goals - 09/12/21 1011       PT LONG TERM GOAL #1   Title Pt will be independent in her HEP and progression.    Baseline -    Time 6    Period Weeks    Status New    Target Date 10/24/21      PT LONG TERM GOAL #2   Title Pt will be able to perform 5x sit to stand in </= 20 seconds with/without UE support.    Time 6    Period Weeks    Status New    Target  Date 10/24/21      PT LONG TERM GOAL #3   Title Pt will be able to amb with no assistive device on level surfaces with pain </= 3/10.    Baseline -    Time 6    Period Weeks    Status New    Target Date 10/24/21      PT LONG TERM GOAL #4   Title Pt will improve her FOTO to >/= 70%    Baseline -    Time 6    Period Weeks    Status New    Target Date 10/24/21      PT LONG TERM GOAL #5   Title improve gait velocity to > 2.0 ft/sec for improved mobility and decreased fall risk    Time 6    Period Weeks    Status New    Target Date 10/24/21                   Plan - 09/25/21 1550     Clinical Impression Statement Pt tolerated session well today and is progressing well with PT.  Will continue to benefit from PT to maximize function.    Personal Factors and Comorbidities Comorbidity 3+    Comorbidities arthritis, hypothyroidism, foot surgery on right, Left THA 2021    Examination-Activity Limitations Stand;Stairs;Squat;Sleep;Sit;Transfers    Examination-Participation Restrictions Other;Community Activity;Yard Work;Driving    Stability/Clinical Decision Making Evolving/Moderate complexity    Rehab Potential Fair    PT Frequency 2x / week    PT Duration 6 weeks    PT Treatment/Interventions ADLs/Self Care Home Management;Cryotherapy;Electrical Stimulation;Iontophoresis 47m/ml Dexamethasone;Moist Heat;Traction;Ultrasound;Balance training;Therapeutic exercise;Therapeutic activities;Functional mobility training;Stair training;Gait training;Neuromuscular re-education;Patient/family education;Passive range of motion;Manual techniques;Dry needling;Taping;Spinal Manipulations;DME Instruction    PT Next Visit Plan Low back, hip abductors and general leg strength, body mechanics, balance and functional activities to improve endurance and comfort with ADLs, likes NuStep    PT Home Exercise Plan Access Code: [email or web address]    Consulted and Agree with  Plan of Care Patient              Patient will benefit from skilled therapeutic intervention in order to improve the following deficits and impairments:  Pain, Difficulty walking, Decreased activity tolerance, Postural dysfunction, Decreased strength, Decreased mobility, Impaired flexibility, Decreased balance, Abnormal gait  Visit Diagnosis: Difficulty walking  Muscle weakness (generalized)  Unsteadiness on feet  Acute right-sided low back pain with right-sided sciatica  Pain in right hip     Problem List Patient Active Problem List   Diagnosis Date Noted   Lumbar radiculopathy 08/20/2021   Status post total replacement of left hip 02/03/2020   Unilateral primary osteoarthritis, left hip 12/01/2019   Low back pain 10/27/2019   Right hip pain 07/14/2017   Chest pain 06/11/2015   Hypothyroidism 06/11/2015        Laureen Abrahams, PT, DPT 09/25/21 3:53 PM     [facility] [address] [place], [place], [address] Phone: [phone]   Fax:  [phone]  Name: Brenda Ortiz MRN: [id number] Date of Birth: 06/24/1941

## 2021-09-27 ENCOUNTER — Other Ambulatory Visit: Payer: Self-pay

## 2021-09-27 ENCOUNTER — Ambulatory Visit: Payer: Medicare PPO | Admitting: Physical Therapy

## 2021-09-27 ENCOUNTER — Encounter: Payer: Self-pay | Admitting: Physical Therapy

## 2021-09-27 DIAGNOSIS — M6281 Muscle weakness (generalized): Secondary | ICD-10-CM

## 2021-09-27 DIAGNOSIS — M25551 Pain in right hip: Secondary | ICD-10-CM

## 2021-09-27 DIAGNOSIS — R2681 Unsteadiness on feet: Secondary | ICD-10-CM

## 2021-09-27 DIAGNOSIS — R262 Difficulty in walking, not elsewhere classified: Secondary | ICD-10-CM

## 2021-09-27 DIAGNOSIS — M5441 Lumbago with sciatica, right side: Secondary | ICD-10-CM | POA: Diagnosis not present

## 2021-09-27 NOTE — Therapy (Signed)
[facility] [address] [place], [place], [address] Phone: [phone]   Fax:  [phone]  Physical Therapy Treatment  Patient Details  Name: Brenda Ortiz MRN: [id number] Date of Birth: 1941-02-15 Referring Provider (PT): Emelda Brothers, MD   Encounter Date: 09/27/2021   PT End of Session - 09/27/21 0927     Visit Number 4    Number of Visits 12    Date for PT Re-Evaluation 11/07/21    Progress Note Due on Visit 10    PT Start Time 0851    PT Stop Time 0929    PT Time Calculation (min) 38 min    Activity Tolerance Patient tolerated treatment well;No increased pain    Behavior During Therapy [facility] for tasks assessed/performed             Past Medical History:  Diagnosis Date   Arthritis    Herniated lumbar intervertebral disc 2022   Hypothyroidism    Incontinence    Seasonal allergies    UTI (urinary tract infection) 06/2021    Past Surgical History:  Procedure Laterality Date   DILATION AND CURETTAGE OF UTERUS     x2   FOOT SURGERY Right    HYSTEROSCOPY WITH D & C N/A 08/14/2016   Procedure: DILATATION AND CURETTAGE /HYSTEROSCOPY;  Surgeon: Brien Few, MD;  Location: [facility];  Service: Gynecology;  Laterality: N/A;   JOINT REPLACEMENT Left 02/03/2020   LUMBAR LAMINECTOMY/ DECOMPRESSION WITH MET-RX Right 08/20/2021   Procedure: Right Thoracic 12-Lumbar 1 Minimally invasive discectomy with metrx;  Surgeon: Judith Part, MD;  Location: [facility];  Service: Neurosurgery;  Laterality: Right;  3C/RM 19   TONSILLECTOMY     TOTAL HIP ARTHROPLASTY Left 02/03/2020   Procedure: LEFT TOTAL HIP ARTHROPLASTY ANTERIOR APPROACH;  Surgeon: Mcarthur Rossetti, MD;  Location: [facility];  Service: Orthopedics;  Laterality: Left;    There were no vitals filed for this visit.   Subjective Assessment - 09/27/21 0854     Subjective Rt hip pain woke her up last night, but besides that she's doing fairly well    Pertinent  History arthritis, hypothyroidism, foot surgery on right, Left THA 2021    Limitations Standing;Walking    How long can you stand comfortably? hurts with initial standing    How long can you walk comfortably? okay once she gets moving    Patient Stated Goals walk normal, improve pain    Currently in Pain? Yes    Pain Score 3     Pain Location Hip    Pain Orientation Right    Pain Descriptors / Indicators Discomfort    Pain Type Acute pain;Surgical pain    Pain Onset More than a month ago    Pain Frequency Intermittent    Aggravating Factors  sit to stand, prolongd positioning    Pain Relieving Factors walking, HEP                               [facility] Adult PT Treatment/Exercise - 09/27/21 0856       Lumbar Exercises: Stretches   ITB Stretch Right;3 reps;30 seconds    Piriformis Stretch Right;3 reps;30 seconds    Piriformis Stretch Limitations seated      Lumbar Exercises: Aerobic   Nustep L5 x 10 min                 Balance Exercises - 09/27/21 0914  Balance Exercises: Standing   Tandem Stance Eyes open;Foam/compliant surface;3 reps;30 secs    Step Ups Forward;4 inch;Intermittent UE support   onto foam   Other Standing Exercises Comments alternating lateral steps with er x10 bil                  PT Short Term Goals - 09/12/21 1010       PT SHORT TERM GOAL #1   Title Patient will be able to independently initiate HEP    Baseline -    Time 3    Period Weeks    Status New    Target Date 10/03/21      PT SHORT TERM GOAL #2   Title n/a    Baseline -      PT SHORT TERM GOAL #3   Title -               PT Long Term Goals - 09/12/21 1011       PT LONG TERM GOAL #1   Title Pt will be independent in her HEP and progression.    Baseline -    Time 6    Period Weeks    Status New    Target Date 10/24/21      PT LONG TERM GOAL #2   Title Pt will be able to perform 5x sit to stand in </= 20 seconds with/without UE  support.    Time 6    Period Weeks    Status New    Target Date 10/24/21      PT LONG TERM GOAL #3   Title Pt will be able to amb with no assistive device on level surfaces with pain </= 3/10.    Baseline -    Time 6    Period Weeks    Status New    Target Date 10/24/21      PT LONG TERM GOAL #4   Title Pt will improve her FOTO to >/= 70%    Baseline -    Time 6    Period Weeks    Status New    Target Date 10/24/21      PT LONG TERM GOAL #5   Title improve gait velocity to > 2.0 ft/sec for improved mobility and decreased fall risk    Time 6    Period Weeks    Status New    Target Date 10/24/21                   Plan - 09/27/21 0927     Clinical Impression Statement Pt tolerated session well today with focus on balance and strengthening.  Will continue to benefit from PT to maximize function.    Personal Factors and Comorbidities Comorbidity 3+    Comorbidities arthritis, hypothyroidism, foot surgery on right, Left THA 2021    Examination-Activity Limitations Stand;Stairs;Squat;Sleep;Sit;Transfers    Examination-Participation Restrictions Other;Community Activity;Yard Work;Driving    Stability/Clinical Decision Making Evolving/Moderate complexity    Rehab Potential Fair    PT Frequency 2x / week    PT Duration 6 weeks    PT Treatment/Interventions ADLs/Self Care Home Management;Cryotherapy;Electrical Stimulation;Iontophoresis 3m/ml Dexamethasone;Moist Heat;Traction;Ultrasound;Balance training;Therapeutic exercise;Therapeutic activities;Functional mobility training;Stair training;Gait training;Neuromuscular re-education;Patient/family education;Passive range of motion;Manual techniques;Dry needling;Taping;Spinal Manipulations;DME Instruction    PT Next Visit Plan Low back, hip abductors and general leg strength, body mechanics, balance and functional activities to improve endurance and comfort with ADLs, likes NuStep    PT Home Exercise Plan Access Code: [email or web address]  Consulted and Agree with Plan of Care Patient             Patient will benefit from skilled therapeutic intervention in order to improve the following deficits and impairments:  Pain, Difficulty walking, Decreased activity tolerance, Postural dysfunction, Decreased strength, Decreased mobility, Impaired flexibility, Decreased balance, Abnormal gait  Visit Diagnosis: Difficulty walking  Muscle weakness (generalized)  Unsteadiness on feet  Acute right-sided low back pain with right-sided sciatica  Pain in right hip     Problem List Patient Active Problem List   Diagnosis Date Noted   Lumbar radiculopathy 08/20/2021   Status post total replacement of left hip 02/03/2020   Unilateral primary osteoarthritis, left hip 12/01/2019   Low back pain 10/27/2019   Right hip pain 07/14/2017   Chest pain 06/11/2015   Hypothyroidism 06/11/2015      Laureen Abrahams, PT, DPT 09/27/21 9:30 AM     [facility] [address] [place], [place], [address] Phone: [phone]   Fax:  [phone]  Name: Brenda Ortiz MRN: [id number] Date of Birth: Mar 19, 1941

## 2021-09-30 DIAGNOSIS — T50Z95A Adverse effect of other vaccines and biological substances, initial encounter: Secondary | ICD-10-CM | POA: Diagnosis not present

## 2021-09-30 DIAGNOSIS — R030 Elevated blood-pressure reading, without diagnosis of hypertension: Secondary | ICD-10-CM | POA: Diagnosis not present

## 2021-09-30 DIAGNOSIS — R42 Dizziness and giddiness: Secondary | ICD-10-CM | POA: Diagnosis not present

## 2021-10-01 ENCOUNTER — Ambulatory Visit: Payer: Medicare PPO | Admitting: Physical Therapy

## 2021-10-01 ENCOUNTER — Other Ambulatory Visit: Payer: Self-pay

## 2021-10-01 ENCOUNTER — Encounter: Payer: Self-pay | Admitting: Physical Therapy

## 2021-10-01 DIAGNOSIS — R2681 Unsteadiness on feet: Secondary | ICD-10-CM

## 2021-10-01 DIAGNOSIS — M6281 Muscle weakness (generalized): Secondary | ICD-10-CM

## 2021-10-01 DIAGNOSIS — R42 Dizziness and giddiness: Secondary | ICD-10-CM

## 2021-10-01 DIAGNOSIS — R262 Difficulty in walking, not elsewhere classified: Secondary | ICD-10-CM

## 2021-10-01 DIAGNOSIS — M25551 Pain in right hip: Secondary | ICD-10-CM

## 2021-10-01 DIAGNOSIS — M5441 Lumbago with sciatica, right side: Secondary | ICD-10-CM

## 2021-10-01 NOTE — Therapy (Signed)
[facility] [address] [place], [place], [address] Phone: [phone]   Fax:  [phone]  Physical Therapy Treatment/Recertification  Patient Details  Name: Brenda Ortiz MRN: [id number] Date of Birth: [birth year] Referring Provider (PT): Emelda Brothers, MD   Encounter Date: 10/01/2021   PT End of Session - 10/01/21 1131     Visit Number 5    Number of Visits 12    Date for PT Re-Evaluation 11/07/21    Progress Note Due on Visit 10    PT Start Time 0930    PT Stop Time 1010    PT Time Calculation (min) 40 min    Activity Tolerance Patient tolerated treatment well;No increased pain    Behavior During Therapy [facility] for tasks assessed/performed             Past Medical History:  Diagnosis Date   Arthritis    Herniated lumbar intervertebral disc 2022   Hypothyroidism    Incontinence    Seasonal allergies    UTI (urinary tract infection) 06/2021    Past Surgical History:  Procedure Laterality Date   DILATION AND CURETTAGE OF UTERUS     x2   FOOT SURGERY Right    HYSTEROSCOPY WITH D & C N/A 08/14/2016   Procedure: DILATATION AND CURETTAGE /HYSTEROSCOPY;  Surgeon: Brien Few, MD;  Location: [facility];  Service: Gynecology;  Laterality: N/A;   JOINT REPLACEMENT Left 02/03/2020   LUMBAR LAMINECTOMY/ DECOMPRESSION WITH MET-RX Right 08/20/2021   Procedure: Right Thoracic 12-Lumbar 1 Minimally invasive discectomy with metrx;  Surgeon: Judith Part, MD;  Location: [facility];  Service: Neurosurgery;  Laterality: Right;  3C/RM 19   TONSILLECTOMY     TOTAL HIP ARTHROPLASTY Left 02/03/2020   Procedure: LEFT TOTAL HIP ARTHROPLASTY ANTERIOR APPROACH;  Surgeon: Mcarthur Rossetti, MD;  Location: [facility];  Service: Orthopedics;  Laterality: Left;    There were no vitals filed for this visit.   Subjective Assessment - 10/01/21 0932     Subjective had a bad fall - reports it's from having a severe case of vertigo.  she fell  hard on her Rt side and Rt shoulder is very painful.  went to PCP - thinks it may have been from COVID shot and high blood pressure.    Pertinent History arthritis, hypothyroidism, foot surgery on right, Left THA 2021    Limitations Standing;Walking    How long can you stand comfortably? hurts with initial standing    How long can you walk comfortably? okay once she gets moving    Patient Stated Goals walk normal, improve pain    Currently in Pain? Yes    Pain Score 0-No pain   up to 10/10   Pain Location Shoulder    Pain Orientation Right    Pain Descriptors / Indicators Discomfort    Pain Type Acute pain;Surgical pain    Pain Onset More than a month ago    Pain Frequency Constant                     Vestibular Assessment - 10/01/21 0939       Vestibular Assessment   General Observation no symptoms at rest      Symptom Behavior   Subjective history of current problem sudden onset    Type of Dizziness  Spinning;Imbalance   nausea, fatigue   Frequency of Dizziness several times a few days ago    Duration of Dizziness seconds to minutes  Symptom Nature Spontaneous    Aggravating Factors Spontaneous onset;Activity in general;Turning body quickly;Turning head quickly    Relieving Factors No known relieving factors;Medication    Progression of Symptoms Better      Oculomotor Exam   Ocular ROM WNL    Spontaneous Absent    Gaze-induced  Absent    Smooth Pursuits Intact    Saccades Overshoots;Poor trajectory      Vestibulo-Ocular Reflex   VOR 1 Head Only (x 1 viewing) WNL - no symptoms (pt recently took meclizine)      Positional Testing   Dix-Hallpike Dix-Hallpike Right;Dix-Hallpike Left    Horizontal Canal Testing Horizontal Canal Right;Horizontal Canal Left      Dix-Hallpike Right   Dix-Hallpike Right Duration none    Dix-Hallpike Right Symptoms No nystagmus      Dix-Hallpike Left   Dix-Hallpike Left Duration none    Dix-Hallpike Left Symptoms No nystagmus       Horizontal Canal Right   Horizontal Canal Right Duration none    Horizontal Canal Right Symptoms Normal      Horizontal Canal Left   Horizontal Canal Left Duration none    Horizontal Canal Left Symptoms Normal      Orthostatics   BP supine (x 5 minutes) 158/91    HR supine (x 5 minutes) 68    BP standing (after 1 minute) 168/96    HR standing (after 1 minute) 76    BP standing (after 3 minutes) 174/98    HR standing (after 3 minutes) 75                      [facility] Adult PT Treatment/Exercise - 10/01/21 1009       Self-Care   Self-Care Other Self-Care Comments    Other Self-Care Comments  use of theracane for self-mobilization and massage at home                       PT Short Term Goals - 10/01/21 1131       PT SHORT TERM GOAL #1   Title Patient will be able to independently initiate HEP    Baseline 11/15: reports compliance, deferred today due to other concerns    Time 3    Period Weeks    Status Partially Met    Target Date 10/03/21      PT SHORT TERM GOAL #2   Title n/a    Baseline -      PT SHORT TERM GOAL #3   Title -               PT Long Term Goals - 10/01/21 1132       PT LONG TERM GOAL #1   Title Pt will be independent in her HEP and progression.    Baseline -    Time 6    Period Weeks    Status On-going    Target Date 10/24/21      PT LONG TERM GOAL #2   Title Pt will be able to perform 5x sit to stand in </= 20 seconds with/without UE support.    Time 6    Period Weeks    Status On-going    Target Date 10/24/21      PT LONG TERM GOAL #3   Title Pt will be able to amb with no assistive device on level surfaces with pain </= 3/10.    Baseline -    Time 6  Period Weeks    Status On-going    Target Date 10/24/21      PT LONG TERM GOAL #4   Title Pt will improve her FOTO to >/= 70%    Baseline -    Time 6    Period Weeks    Status On-going    Target Date 10/24/21      PT LONG TERM GOAL #5    Title improve gait velocity to > 2.0 ft/sec for improved mobility and decreased fall risk    Time 6    Period Weeks    Status On-going    Target Date 10/24/21      Additional Long Term Goals   Additional Long Term Goals Yes      PT LONG TERM GOAL #6   Title vestibular goals to follow if indicated    Status New    Target Date 10/24/21                   Plan - 10/01/21 1133     Clinical Impression Statement Pt reports onset of vertigo over the weekend, with 1 fall reported from it and new Rt shoulder pain.  She saw her PCP for this and was prescribed meclizine.  Did perform vestibular assessment today but no significant findings identified, possibly due to recent meclizine use.  She did have elevated BP today which her PCP is monitoring.  Any back exercises deferred today, and if shoulder is not improving in the next week then recommend she f/u with MD.  Will plan to reassess vertigo PRN to help reduce fall risk and possible reinjury to back.    Personal Factors and Comorbidities Comorbidity 3+    Comorbidities arthritis, hypothyroidism, foot surgery on right, Left THA 2021    Examination-Activity Limitations Stand;Stairs;Squat;Sleep;Sit;Transfers    Examination-Participation Restrictions Other;Community Activity;Yard Work;Driving    Stability/Clinical Decision Making Evolving/Moderate complexity    Rehab Potential Fair    PT Frequency 2x / week    PT Duration 6 weeks    PT Treatment/Interventions ADLs/Self Care Home Management;Cryotherapy;Electrical Stimulation;Iontophoresis 75m/ml Dexamethasone;Moist Heat;Traction;Ultrasound;Balance training;Therapeutic exercise;Therapeutic activities;Functional mobility training;Stair training;Gait training;Neuromuscular re-education;Patient/family education;Passive range of motion;Manual techniques;Dry needling;Taping;Spinal Manipulations;DME Instruction;Canalith Repostioning;Vestibular    PT Next Visit Plan Low back, hip abductors and general  leg strength, body mechanics, balance and functional activities to improve endurance and comfort with ADLs, likes NuStep; reassess vertigo PRN    PT Home Exercise Plan Access Code: [email or web address]    Consulted and Agree with Plan of Care Patient             Patient will benefit from skilled therapeutic intervention in order to improve the following deficits and impairments:  Pain, Difficulty walking, Decreased activity tolerance, Postural dysfunction, Decreased strength, Decreased mobility, Impaired flexibility, Decreased balance, Abnormal gait  Visit Diagnosis: Difficulty walking - Plan: PT plan of care cert/re-cert  Muscle weakness (generalized) - Plan: PT plan of care cert/re-cert  Acute right-sided low back pain with right-sided sciatica - Plan: PT plan of care cert/re-cert  Unsteadiness on feet - Plan: PT plan of care cert/re-cert  Pain in right hip - Plan: PT plan of care cert/re-cert  Dizziness and giddiness - Plan: PT plan of care cert/re-cert     Problem List Patient Active Problem List   Diagnosis Date Noted   Lumbar radiculopathy 08/20/2021   Status post total replacement of left hip 02/03/2020   Unilateral primary osteoarthritis, left hip 12/01/2019   Low back pain 10/27/2019   Right hip  pain 07/14/2017   Chest pain 06/11/2015   Hypothyroidism 06/11/2015      Laureen Abrahams, PT, DPT 10/01/21 11:40 AM     [facility] [address] [place], [place], [address] Phone: [phone]   Fax:  [phone]  Name: CHERIA SADIQ MRN: [id number] Date of Birth: [birth year]

## 2021-10-03 DIAGNOSIS — R030 Elevated blood-pressure reading, without diagnosis of hypertension: Secondary | ICD-10-CM | POA: Diagnosis not present

## 2021-10-04 ENCOUNTER — Encounter: Payer: Medicare PPO | Admitting: Physical Therapy

## 2021-10-04 ENCOUNTER — Other Ambulatory Visit: Payer: Self-pay

## 2021-10-07 ENCOUNTER — Other Ambulatory Visit: Payer: Self-pay

## 2021-10-07 ENCOUNTER — Encounter: Payer: Self-pay | Admitting: Physical Therapy

## 2021-10-07 ENCOUNTER — Ambulatory Visit: Payer: Medicare PPO | Admitting: Physical Therapy

## 2021-10-07 DIAGNOSIS — M6281 Muscle weakness (generalized): Secondary | ICD-10-CM

## 2021-10-07 DIAGNOSIS — R262 Difficulty in walking, not elsewhere classified: Secondary | ICD-10-CM | POA: Diagnosis not present

## 2021-10-07 DIAGNOSIS — R2681 Unsteadiness on feet: Secondary | ICD-10-CM

## 2021-10-07 DIAGNOSIS — M5441 Lumbago with sciatica, right side: Secondary | ICD-10-CM | POA: Diagnosis not present

## 2021-10-07 DIAGNOSIS — M25551 Pain in right hip: Secondary | ICD-10-CM

## 2021-10-07 NOTE — Therapy (Signed)
[facility] [address] [place], [place], [address] Phone: [phone]   Fax:  [phone]  Physical Therapy Treatment  Patient Details  Name: Brenda Ortiz MRN: [id number] Date of Birth: Jan 14, 1941 Referring Provider (PT): Emelda Brothers, MD   Encounter Date: 10/07/2021   PT End of Session - 10/07/21 1242     Visit Number 6    Number of Visits 12    Date for PT Re-Evaluation 11/07/21    Progress Note Due on Visit 10    PT Start Time 1147    PT Stop Time 1229    PT Time Calculation (min) 42 min    Activity Tolerance Patient tolerated treatment well;No increased pain    Behavior During Therapy [facility] for tasks assessed/performed             Past Medical History:  Diagnosis Date   Arthritis    Herniated lumbar intervertebral disc 2022   Hypothyroidism    Incontinence    Seasonal allergies    UTI (urinary tract infection) 06/2021    Past Surgical History:  Procedure Laterality Date   DILATION AND CURETTAGE OF UTERUS     x2   FOOT SURGERY Right    HYSTEROSCOPY WITH D & C N/A 08/14/2016   Procedure: DILATATION AND CURETTAGE /HYSTEROSCOPY;  Surgeon: Brien Few, MD;  Location: [place] [facility];  Service: Gynecology;  Laterality: N/A;   JOINT REPLACEMENT Left 02/03/2020   LUMBAR LAMINECTOMY/ DECOMPRESSION WITH MET-RX Right 08/20/2021   Procedure: Right Thoracic 12-Lumbar 1 Minimally invasive discectomy with metrx;  Surgeon: Judith Part, MD;  Location: [place];  Service: Neurosurgery;  Laterality: Right;  3C/RM 19   TONSILLECTOMY     TOTAL HIP ARTHROPLASTY Left 02/03/2020   Procedure: LEFT TOTAL HIP ARTHROPLASTY ANTERIOR APPROACH;  Surgeon: Mcarthur Rossetti, MD;  Location: [facility];  Service: Orthopedics;  Laterality: Left;    There were no vitals filed for this visit.   Subjective Assessment - 10/07/21 1152     Subjective dizziness is much improved - MD put her on a BP medication but she doesn't know the name.     Pertinent History arthritis, hypothyroidism, foot surgery on right, Left THA 2021    Limitations Standing;Walking    How long can you stand comfortably? hurts with initial standing    How long can you walk comfortably? okay once she gets moving    Patient Stated Goals walk normal, improve pain    Currently in Pain? Yes    Pain Score 2     Pain Location Hip    Pain Orientation Right    Pain Descriptors / Indicators Discomfort    Pain Type Acute pain;Surgical pain    Pain Onset More than a month ago    Pain Frequency Constant    Aggravating Factors  sit to stand, prolonged positioning    Pain Relieving Factors walking, HEP                               [facility] Adult PT Treatment/Exercise - 10/07/21 1156       Lumbar Exercises: Stretches   Single Knee to Chest Stretch Right;Left;3 reps;30 seconds    Single Knee to Chest Stretch Limitations Other leg straight    Piriformis Stretch Right;Left;3 reps;30 seconds    Piriformis Stretch Limitations supine knee to opposite shoulder      Lumbar Exercises: Seated   Long Arc Quad on Chair Both;2  sets;10 reps;Weights    LAQ on Chair Weights (lbs) 3    Hip Flexion on Ball Both;20 reps    Hip Flexion on Ball Limitations seated on mat table; 3#    Sit to Stand 10 reps    Sit to Stand Limitations 2 sets; no UE support    Other Seated Lumbar Exercises ball squeeze 20 x 5 sec hold      Lumbar Exercises: Supine   Bridge 20 reps;5 seconds                       PT Short Term Goals - 10/01/21 1131       PT SHORT TERM GOAL #1   Title Patient will be able to independently initiate HEP    Baseline 11/15: reports compliance, deferred today due to other concerns    Time 3    Period Weeks    Status Partially Met    Target Date 10/03/21      PT SHORT TERM GOAL #2   Title n/a    Baseline -      PT SHORT TERM GOAL #3   Title -               PT Long Term Goals - 10/01/21 1132       PT LONG TERM GOAL #1    Title Pt will be independent in her HEP and progression.    Baseline -    Time 6    Period Weeks    Status On-going    Target Date 10/24/21      PT LONG TERM GOAL #2   Title Pt will be able to perform 5x sit to stand in </= 20 seconds with/without UE support.    Time 6    Period Weeks    Status On-going    Target Date 10/24/21      PT LONG TERM GOAL #3   Title Pt will be able to amb with no assistive device on level surfaces with pain </= 3/10.    Baseline -    Time 6    Period Weeks    Status On-going    Target Date 10/24/21      PT LONG TERM GOAL #4   Title Pt will improve her FOTO to >/= 70%    Baseline -    Time 6    Period Weeks    Status On-going    Target Date 10/24/21      PT LONG TERM GOAL #5   Title improve gait velocity to > 2.0 ft/sec for improved mobility and decreased fall risk    Time 6    Period Weeks    Status On-going    Target Date 10/24/21      Additional Long Term Goals   Additional Long Term Goals Yes      PT LONG TERM GOAL #6   Title vestibular goals to follow if indicated    Status New    Target Date 10/24/21                   Plan - 10/07/21 1242     Clinical Impression Statement Pt reports vertigo has significantly improved but session today limited to seated and supine to minimize risk of aggravating symptoms.  Will continue to benefit from PT to maximize function.    Personal Factors and Comorbidities Comorbidity 3+    Comorbidities arthritis, hypothyroidism, foot surgery on right, Left THA 2021  Examination-Activity Limitations Stand;Stairs;Squat;Sleep;Sit;Transfers    Examination-Participation Restrictions Other;Community Activity;Yard Work;Driving    Stability/Clinical Decision Making Evolving/Moderate complexity    Rehab Potential Fair    PT Frequency 2x / week    PT Duration 6 weeks    PT Treatment/Interventions ADLs/Self Care Home Management;Cryotherapy;Electrical Stimulation;Iontophoresis 26m/ml  Dexamethasone;Moist Heat;Traction;Ultrasound;Balance training;Therapeutic exercise;Therapeutic activities;Functional mobility training;Stair training;Gait training;Neuromuscular re-education;Patient/family education;Passive range of motion;Manual techniques;Dry needling;Taping;Spinal Manipulations;DME Instruction;Canalith Repostioning;Vestibular    PT Next Visit Plan Low back, hip abductors and general leg strength, body mechanics, balance and functional activities to improve endurance and comfort with ADLs, likes NuStep; reassess vertigo PRN    PT Home Exercise Plan Access Code: [email or web address]    Consulted and Agree with Plan of Care Patient             Patient will benefit from skilled therapeutic intervention in order to improve the following deficits and impairments:  Pain, Difficulty walking, Decreased activity tolerance, Postural dysfunction, Decreased strength, Decreased mobility, Impaired flexibility, Decreased balance, Abnormal gait  Visit Diagnosis: Difficulty walking  Muscle weakness (generalized)  Acute right-sided low back pain with right-sided sciatica  Unsteadiness on feet  Pain in right hip     Problem List Patient Active Problem List   Diagnosis Date Noted   Lumbar radiculopathy 08/20/2021   Status post total replacement of left hip 02/03/2020   Unilateral primary osteoarthritis, left hip 12/01/2019   Low back pain 10/27/2019   Right hip pain 07/14/2017   Chest pain 06/11/2015   Hypothyroidism 06/11/2015     SLaureen Abrahams PT, DPT 10/07/21 12:44 PM     [facility] [address] [address]Phone: [phone]  Fax:  [phone] Name: Brenda KIGERMRN: [id number] of Birth: 1February 12, 1942

## 2021-10-08 ENCOUNTER — Encounter: Payer: Self-pay | Admitting: Physical Therapy

## 2021-10-08 ENCOUNTER — Ambulatory Visit: Payer: Medicare PPO | Admitting: Physical Therapy

## 2021-10-08 DIAGNOSIS — M25551 Pain in right hip: Secondary | ICD-10-CM | POA: Diagnosis not present

## 2021-10-08 DIAGNOSIS — R262 Difficulty in walking, not elsewhere classified: Secondary | ICD-10-CM

## 2021-10-08 DIAGNOSIS — M6281 Muscle weakness (generalized): Secondary | ICD-10-CM | POA: Diagnosis not present

## 2021-10-08 DIAGNOSIS — M5441 Lumbago with sciatica, right side: Secondary | ICD-10-CM

## 2021-10-08 DIAGNOSIS — R2681 Unsteadiness on feet: Secondary | ICD-10-CM | POA: Diagnosis not present

## 2021-10-08 NOTE — Therapy (Signed)
[facility] [address] [place], [place], [address] Phone: [phone]   Fax:  [phone]  Physical Therapy Treatment  Patient Details  Name: Brenda Ortiz MRN: [id number] Date of Birth: 1941-06-29 Referring Provider (PT): Emelda Brothers, MD   Encounter Date: 10/08/2021   PT End of Session - 10/08/21 1403     Visit Number 7    Number of Visits 12    Date for PT Re-Evaluation 11/07/21    Progress Note Due on Visit 10    PT Start Time 1017    PT Stop Time 1059    PT Time Calculation (min) 42 min    Activity Tolerance Patient tolerated treatment well;No increased pain    Behavior During Therapy [facility] for tasks assessed/performed             Past Medical History:  Diagnosis Date   Arthritis    Herniated lumbar intervertebral disc 2022   Hypothyroidism    Incontinence    Seasonal allergies    UTI (urinary tract infection) 06/2021    Past Surgical History:  Procedure Laterality Date   DILATION AND CURETTAGE OF UTERUS     x2   FOOT SURGERY Right    HYSTEROSCOPY WITH D & C N/A 08/14/2016   Procedure: DILATATION AND CURETTAGE /HYSTEROSCOPY;  Surgeon: Brien Few, MD;  Location: [place] [facility];  Service: Gynecology;  Laterality: N/A;   JOINT REPLACEMENT Left 02/03/2020   LUMBAR LAMINECTOMY/ DECOMPRESSION WITH MET-RX Right 08/20/2021   Procedure: Right Thoracic 12-Lumbar 1 Minimally invasive discectomy with metrx;  Surgeon: Judith Part, MD;  Location: [place];  Service: Neurosurgery;  Laterality: Right;  3C/RM 19   TONSILLECTOMY     TOTAL HIP ARTHROPLASTY Left 02/03/2020   Procedure: LEFT TOTAL HIP ARTHROPLASTY ANTERIOR APPROACH;  Surgeon: Mcarthur Rossetti, MD;  Location: [facility];  Service: Orthopedics;  Laterality: Left;    There were no vitals filed for this visit.   Subjective Assessment - 10/08/21 1019     Subjective feels about the same as yesterday.  dizziness continues to be improved    Pertinent History  arthritis, hypothyroidism, foot surgery on right, Left THA 2021    Limitations Standing;Walking    How long can you stand comfortably? hurts with initial standing    How long can you walk comfortably? okay once she gets moving    Patient Stated Goals walk normal, improve pain    Currently in Pain? Yes    Pain Score 2     Pain Location Hip    Pain Orientation Right    Pain Descriptors / Indicators Discomfort    Pain Type Acute pain;Surgical pain    Pain Onset More than a month ago    Pain Frequency Constant    Aggravating Factors  sit to stand, prolonged positioning    Pain Relieving Factors walking, HEP                               [facility] Adult PT Treatment/Exercise - 10/08/21 1023       Lumbar Exercises: Stretches   Single Knee to Chest Stretch Right;Left;3 reps;30 seconds    Single Knee to Chest Stretch Limitations Other leg straight    Piriformis Stretch Right;Left;3 reps;30 seconds    Piriformis Stretch Limitations supine knee to opposite shoulder      Lumbar Exercises: Standing   Other Standing Lumbar Exercises marching and hip abduction, hip extension on foam  with light UE support x 20 reps each bil      Lumbar Exercises: Supine   Bridge 20 reps;5 seconds                       PT Short Term Goals - 10/01/21 1131       PT SHORT TERM GOAL #1   Title Patient will be able to independently initiate HEP    Baseline 11/15: reports compliance, deferred today due to other concerns    Time 3    Period Weeks    Status Partially Met    Target Date 10/03/21      PT SHORT TERM GOAL #2   Title n/a    Baseline -      PT SHORT TERM GOAL #3   Title -               PT Long Term Goals - 10/01/21 1132       PT LONG TERM GOAL #1   Title Pt will be independent in her HEP and progression.    Baseline -    Time 6    Period Weeks    Status On-going    Target Date 10/24/21      PT LONG TERM GOAL #2   Title Pt will be able to perform  5x sit to stand in </= 20 seconds with/without UE support.    Time 6    Period Weeks    Status On-going    Target Date 10/24/21      PT LONG TERM GOAL #3   Title Pt will be able to amb with no assistive device on level surfaces with pain </= 3/10.    Baseline -    Time 6    Period Weeks    Status On-going    Target Date 10/24/21      PT LONG TERM GOAL #4   Title Pt will improve her FOTO to >/= 70%    Baseline -    Time 6    Period Weeks    Status On-going    Target Date 10/24/21      PT LONG TERM GOAL #5   Title improve gait velocity to > 2.0 ft/sec for improved mobility and decreased fall risk    Time 6    Period Weeks    Status On-going    Target Date 10/24/21      Additional Long Term Goals   Additional Long Term Goals Yes      PT LONG TERM GOAL #6   Title vestibular goals to follow if indicated    Status New    Target Date 10/24/21                   Plan - 10/08/21 1404     Clinical Impression Statement Pt tolerated session well today, did have brief episode of lightheadedness but has started new HTN medication and stopped meclicinze so likely due to these changes.  Standing exercises continue to be challenging needing UE support for balance.  Will continue to benefit from PT to maximize function.    Personal Factors and Comorbidities Comorbidity 3+    Comorbidities arthritis, hypothyroidism, foot surgery on right, Left THA 2021    Examination-Activity Limitations Stand;Stairs;Squat;Sleep;Sit;Transfers    Examination-Participation Restrictions Other;Community Activity;Yard Work;Driving    Stability/Clinical Decision Making Evolving/Moderate complexity    Rehab Potential Fair    PT Frequency 2x / week    PT  Duration 6 weeks    PT Treatment/Interventions ADLs/Self Care Home Management;Cryotherapy;Electrical Stimulation;Iontophoresis 48m/ml Dexamethasone;Moist Heat;Traction;Ultrasound;Balance training;Therapeutic exercise;Therapeutic activities;Functional  mobility training;Stair training;Gait training;Neuromuscular re-education;Patient/family education;Passive range of motion;Manual techniques;Dry needling;Taping;Spinal Manipulations;DME Instruction;Canalith Repostioning;Vestibular    PT Next Visit Plan capture FOTO, Low back, hip abductors and general leg strength, body mechanics, balance and functional activities to improve endurance and comfort with ADLs, likes NuStep; reassess vertigo PRN    PT Home Exercise Plan Access Code: [email or web address]    Consulted and Agree with Plan of Care Patient             Patient will benefit from skilled therapeutic intervention in order to improve the following deficits and impairments:  Pain, Difficulty walking, Decreased activity tolerance, Postural dysfunction, Decreased strength, Decreased mobility, Impaired flexibility, Decreased balance, Abnormal gait  Visit Diagnosis: Difficulty walking  Muscle weakness (generalized)  Acute right-sided low back pain with right-sided sciatica  Unsteadiness on feet  Pain in right hip     Problem List Patient Active Problem List   Diagnosis Date Noted   Lumbar radiculopathy 08/20/2021   Status post total replacement of left hip 02/03/2020   Unilateral primary osteoarthritis, left hip 12/01/2019   Low back pain 10/27/2019   Right hip pain 07/14/2017   Chest pain 06/11/2015   Hypothyroidism 06/11/2015       SLaureen Abrahams PT, DPT 10/08/21 2:06 PM     [facility] [address] [address]Phone: [phone]  Fax:  [phone] Name: MMAYAR WHITTIERMRN: [id number] of Birth: 128-Mar-1942

## 2021-10-14 ENCOUNTER — Other Ambulatory Visit: Payer: Self-pay

## 2021-10-14 ENCOUNTER — Encounter: Payer: Self-pay | Admitting: Physical Therapy

## 2021-10-14 ENCOUNTER — Ambulatory Visit: Payer: Medicare PPO | Admitting: Physical Therapy

## 2021-10-14 VITALS — BP 152/91

## 2021-10-14 DIAGNOSIS — R2681 Unsteadiness on feet: Secondary | ICD-10-CM | POA: Diagnosis not present

## 2021-10-14 DIAGNOSIS — R42 Dizziness and giddiness: Secondary | ICD-10-CM | POA: Diagnosis not present

## 2021-10-14 DIAGNOSIS — R262 Difficulty in walking, not elsewhere classified: Secondary | ICD-10-CM | POA: Diagnosis not present

## 2021-10-14 DIAGNOSIS — M5441 Lumbago with sciatica, right side: Secondary | ICD-10-CM | POA: Diagnosis not present

## 2021-10-14 DIAGNOSIS — M25551 Pain in right hip: Secondary | ICD-10-CM | POA: Diagnosis not present

## 2021-10-14 DIAGNOSIS — M6281 Muscle weakness (generalized): Secondary | ICD-10-CM

## 2021-10-14 NOTE — Therapy (Signed)
[facility] [address] [place], [place], [address] Phone: [phone]   Fax:  [phone]  Physical Therapy Treatment  Patient Details  Name: Brenda Ortiz MRN: [id number] Date of Birth: 07-31-1941 Referring Provider (PT): Emelda Brothers, MD   Encounter Date: 10/14/2021   PT End of Session - 10/14/21 1247     Visit Number 8    Number of Visits 12    Date for PT Re-Evaluation 11/07/21    Progress Note Due on Visit 10    PT Start Time 7681    PT Stop Time 1225    PT Time Calculation (min) 40 min    Activity Tolerance Patient tolerated treatment well;No increased pain    Behavior During Therapy [facility] for tasks assessed/performed             Past Medical History:  Diagnosis Date   Arthritis    Herniated lumbar intervertebral disc 2022   Hypothyroidism    Incontinence    Seasonal allergies    UTI (urinary tract infection) 06/2021    Past Surgical History:  Procedure Laterality Date   DILATION AND CURETTAGE OF UTERUS     x2   FOOT SURGERY Right    HYSTEROSCOPY WITH D & C N/A 08/14/2016   Procedure: DILATATION AND CURETTAGE /HYSTEROSCOPY;  Surgeon: Brien Few, MD;  Location: [place] [facility];  Service: Gynecology;  Laterality: N/A;   JOINT REPLACEMENT Left 02/03/2020   LUMBAR LAMINECTOMY/ DECOMPRESSION WITH MET-RX Right 08/20/2021   Procedure: Right Thoracic 12-Lumbar 1 Minimally invasive discectomy with metrx;  Surgeon: Judith Part, MD;  Location: [place];  Service: Neurosurgery;  Laterality: Right;  3C/RM 19   TONSILLECTOMY     TOTAL HIP ARTHROPLASTY Left 02/03/2020   Procedure: LEFT TOTAL HIP ARTHROPLASTY ANTERIOR APPROACH;  Surgeon: Mcarthur Rossetti, MD;  Location: [facility];  Service: Orthopedics;  Laterality: Left;    Vitals:   10/14/21 1159  BP: (!) 152/91     Subjective Assessment - 10/14/21 1158     Subjective a little more dizzy today, not sure why.  still having some pain in her neck and Rt shoulder     Pertinent History arthritis, hypothyroidism, foot surgery on right, Left THA 2021    Limitations Standing;Walking    How long can you stand comfortably? hurts with initial standing    How long can you walk comfortably? okay once she gets moving    Patient Stated Goals walk normal, improve pain    Currently in Pain? No/denies    Pain Onset --                               [facility] Adult PT Treatment/Exercise - 10/14/21 1200       Lumbar Exercises: Aerobic   Nustep L5 x 10 min      Lumbar Exercises: Supine   Bridge 20 reps;5 seconds      Manual Therapy   Manual therapy comments Rt hip LAD 5 x 30 sec hold; demonstrated and discussed ways to perform at home as pain decreased after manual today                       PT Short Term Goals - 10/01/21 1131       PT SHORT TERM GOAL #1   Title Patient will be able to independently initiate HEP    Baseline 11/15: reports compliance, deferred today due to  other concerns    Time 3    Period Weeks    Status Partially Met    Target Date 10/03/21      PT SHORT TERM GOAL #2   Title n/a    Baseline -      PT SHORT TERM GOAL #3   Title -               PT Long Term Goals - 10/01/21 1132       PT LONG TERM GOAL #1   Title Pt will be independent in her HEP and progression.    Baseline -    Time 6    Period Weeks    Status On-going    Target Date 10/24/21      PT LONG TERM GOAL #2   Title Pt will be able to perform 5x sit to stand in </= 20 seconds with/without UE support.    Time 6    Period Weeks    Status On-going    Target Date 10/24/21      PT LONG TERM GOAL #3   Title Pt will be able to amb with no assistive device on level surfaces with pain </= 3/10.    Baseline -    Time 6    Period Weeks    Status On-going    Target Date 10/24/21      PT LONG TERM GOAL #4   Title Pt will improve her FOTO to >/= 70%    Baseline -    Time 6    Period Weeks    Status On-going    Target  Date 10/24/21      PT LONG TERM GOAL #5   Title improve gait velocity to > 2.0 ft/sec for improved mobility and decreased fall risk    Time 6    Period Weeks    Status On-going    Target Date 10/24/21      Additional Long Term Goals   Additional Long Term Goals Yes      PT LONG TERM GOAL #6   Title vestibular goals to follow if indicated    Status New    Target Date 10/24/21                   Plan - 10/14/21 1247     Clinical Impression Statement Pt with reduction in pain following LAD of Rt hip and discussed ways to perform at home.  Still limited progress due to increased BP and dizziness.  She sees the MD this week so will follow up after that visit.    Personal Factors and Comorbidities Comorbidity 3+    Comorbidities arthritis, hypothyroidism, foot surgery on right, Left THA 2021    Examination-Activity Limitations Stand;Stairs;Squat;Sleep;Sit;Transfers    Examination-Participation Restrictions Other;Community Activity;Yard Work;Driving    Stability/Clinical Decision Making Evolving/Moderate complexity    Rehab Potential Fair    PT Frequency 2x / week    PT Duration 6 weeks    PT Treatment/Interventions ADLs/Self Care Home Management;Cryotherapy;Electrical Stimulation;Iontophoresis 82m/ml Dexamethasone;Moist Heat;Traction;Ultrasound;Balance training;Therapeutic exercise;Therapeutic activities;Functional mobility training;Stair training;Gait training;Neuromuscular re-education;Patient/family education;Passive range of motion;Manual techniques;Dry needling;Taping;Spinal Manipulations;DME Instruction;Canalith Repostioning;Vestibular    PT Next Visit Plan capture FOTO, manual PRN, see what MD says, general balance/strengthening    PT Home Exercise Plan Access Code: [email or web address]    Consulted and Agree with Plan of Care Patient             Patient will benefit from skilled therapeutic intervention in order  to improve the following deficits and impairments:  Pain,  Difficulty walking, Decreased activity tolerance, Postural dysfunction, Decreased strength, Decreased mobility, Impaired flexibility, Decreased balance, Abnormal gait  Visit Diagnosis: Difficulty walking  Muscle weakness (generalized)  Acute right-sided low back pain with right-sided sciatica  Unsteadiness on feet  Pain in right hip  Dizziness and giddiness     Problem List Patient Active Problem List   Diagnosis Date Noted   Lumbar radiculopathy 08/20/2021   Status post total replacement of left hip 02/03/2020   Unilateral primary osteoarthritis, left hip 12/01/2019   Low back pain 10/27/2019   Right hip pain 07/14/2017   Chest pain 06/11/2015   Hypothyroidism 06/11/2015      Laureen Abrahams, PT, DPT 10/14/21 12:50 PM      [facility] [address] [place], [place], [address] Phone: [phone]   Fax:  [phone]  Name: Brenda Ortiz MRN: [id number] Date of Birth: [birth year]

## 2021-10-16 ENCOUNTER — Encounter: Payer: Medicare PPO | Admitting: Physical Therapy

## 2021-10-23 ENCOUNTER — Ambulatory Visit: Payer: Medicare PPO | Admitting: Physical Therapy

## 2021-10-23 ENCOUNTER — Other Ambulatory Visit: Payer: Self-pay

## 2021-10-23 ENCOUNTER — Encounter: Payer: Self-pay | Admitting: Physical Therapy

## 2021-10-23 DIAGNOSIS — M25551 Pain in right hip: Secondary | ICD-10-CM | POA: Diagnosis not present

## 2021-10-23 DIAGNOSIS — M5441 Lumbago with sciatica, right side: Secondary | ICD-10-CM

## 2021-10-23 DIAGNOSIS — R262 Difficulty in walking, not elsewhere classified: Secondary | ICD-10-CM

## 2021-10-23 DIAGNOSIS — R42 Dizziness and giddiness: Secondary | ICD-10-CM

## 2021-10-23 DIAGNOSIS — R2681 Unsteadiness on feet: Secondary | ICD-10-CM

## 2021-10-23 DIAGNOSIS — M6281 Muscle weakness (generalized): Secondary | ICD-10-CM

## 2021-10-23 NOTE — Therapy (Addendum)
[facility] Physical Therapy [address] [place], [place], [address] Phone: [phone]   Fax:  [phone]  Physical Therapy Treatment/Recertification/Progress Note/Discharge Summary Progress Note Reporting Period 09/12/21 to 10/23/21  See note below for Objective Data and Assessment of Progress/Goals.    Referring diagnosis? M25.559 Treatment diagnosis? (if different than referring diagnosis) R26.2, M62.81, M54.41, R26.81, M25.551, R42 What was this (referring dx) caused by? '[x]'  Surgery '[]'  Fall '[x]'  Ongoing issue '[x]'  Arthritis '[]'  Other: ____________  Laterality: '[x]'  Rt '[]'  Lt '[]'  Both  Check all possible CPT codes:      '[]'  97110 (Therapeutic Exercise)  '[]'  92507 (SLP Treatment)  '[]'  97112 (Neuro Re-ed)   '[]'  92526 (Swallowing Treatment)   '[]'  97116 (Gait Training)   '[]'  [id number] (Cognitive Training, 1st 15 minutes) '[]'  97140 (Manual Therapy)   '[]'  97130 (Cognitive Training, each add'l 15 minutes)  '[]'  97530 (Therapeutic Activities)  '[]'  Other, List CPT Code ____________    '[]'  97535 (Self Care)       '[x]'  All codes above (97110 - 97535)  '[]'  97012 (Mechanical Traction)  '[x]'  97014 (E-stim Unattended)  '[]'  97032 (E-stim manual)  '[]'  97033 (Ionto)  '[]'  97035 (Ultrasound)  '[]'  97760 (Orthotic Fit) '[x]'  [id number] (Physical Performance Training) '[]'  [id number] (Aquatic Therapy) '[]'  97034 (Contrast Bath) '[]'  97018 (Paraffin) '[]'  97597 (Wound Care 1st 20 sq cm) '[]'  97598 (Wound Care each add'l 20 sq cm) '[]'  97016 (Vasopneumatic Device) '[]'  [phone] (Orthotic Training) '[]'  [phone] (Prosthetic Training)   Patient Details  Name: Brenda Ortiz MRN: [id number] Date of Birth: Sep 21, 1941 Referring Provider (PT): Emelda Brothers, MD   Encounter Date: 10/23/2021   PT End of Session - 10/23/21 1210     Visit Number 9    Number of Visits 12    Date for PT Re-Evaluation 12/04/21    Progress Note Due on Visit 47    PT Start Time 1151   pt arrived late   PT Stop Time 1229    PT Time  Calculation (min) 38 min    Activity Tolerance Patient tolerated treatment well;No increased pain    Behavior During Therapy [facility] for tasks assessed/performed             Past Medical History:  Diagnosis Date   Arthritis    Herniated lumbar intervertebral disc 2022   Hypothyroidism    Incontinence    Seasonal allergies    UTI (urinary tract infection) 06/2021    Past Surgical History:  Procedure Laterality Date   DILATION AND CURETTAGE OF UTERUS     x2   FOOT SURGERY Right    HYSTEROSCOPY WITH D & C N/A 08/14/2016   Procedure: DILATATION AND CURETTAGE /HYSTEROSCOPY;  Surgeon: Brien Few, MD;  Location: [facility];  Service: Gynecology;  Laterality: N/A;   JOINT REPLACEMENT Left 02/03/2020   LUMBAR LAMINECTOMY/ DECOMPRESSION WITH MET-RX Right 08/20/2021   Procedure: Right Thoracic 12-Lumbar 1 Minimally invasive discectomy with metrx;  Surgeon: Judith Part, MD;  Location: [facility];  Service: Neurosurgery;  Laterality: Right;  3C/RM 19   TONSILLECTOMY     TOTAL HIP ARTHROPLASTY Left 02/03/2020   Procedure: LEFT TOTAL HIP ARTHROPLASTY ANTERIOR APPROACH;  Surgeon: Mcarthur Rossetti, MD;  Location: [facility];  Service: Orthopedics;  Laterality: Left;    There were no vitals filed for this visit.   Subjective Assessment - 10/23/21 1159     Subjective still having high BP, feels unsteady and still having mild Rt hip pain.  MRI of neck and  Rt hip has been scheduled for 10/29/21.    Pertinent History arthritis, hypothyroidism, foot surgery on right, Left THA 2021    Limitations Standing;Walking    How long can you stand comfortably? hurts with initial standing    How long can you walk comfortably? okay once she gets moving    Patient Stated Goals walk normal, improve pain    Currently in Pain? No/denies                [facility] PT Assessment - 10/23/21 1215       Assessment   Medical Diagnosis M25.559 hip pain    Referring Provider (PT) Emelda Brothers, MD     Onset Date/Surgical Date 08/20/21      Observation/Other Assessments   Focus on Therapeutic Outcomes (FOTO)  52      Transfers   Five time sit to stand comments  27.06 sec with UE support      Ambulation/Gait   Gait velocity 2.03 ft/sec    Gait Comments no pain with amb with/without device after first steps                           [facility] Adult PT Treatment/Exercise - 10/23/21 1204       Lumbar Exercises: Stretches   Single Knee to Chest Stretch Right;Left;3 reps;30 seconds    Piriformis Stretch Right;Left;3 reps;30 seconds    Piriformis Stretch Limitations supine knee to opposite shoulder      Lumbar Exercises: Aerobic   Nustep L7 x 6 min                       PT Short Term Goals - 10/23/21 1248       PT SHORT TERM GOAL #1   Title Patient will be able to independently initiate HEP    Time 3    Period Weeks    Status Partially Met    Target Date 10/03/21      PT SHORT TERM GOAL #2   Title n/a    Baseline -      PT SHORT TERM GOAL #3   Title -               PT Long Term Goals - 10/23/21 1248       PT LONG TERM GOAL #1   Title Pt will be independent in her HEP and progression.    Baseline -    Time 6    Period Weeks    Status On-going    Target Date 12/04/21      PT LONG TERM GOAL #2   Title Pt will be able to perform 5x sit to stand in </= 20 seconds with/without UE support.    Baseline 12/7: 27.06 sec    Time 6    Period Weeks    Status On-going    Target Date 12/04/21      PT LONG TERM GOAL #3   Title Pt will be able to amb with no assistive device on level surfaces with pain </= 3/10.    Baseline 12/7: no pain except first few steps    Time 6    Period Weeks    Status Partially Met    Target Date 12/04/21      PT LONG TERM GOAL #4   Title Pt will improve her FOTO to >/= 70%    Baseline 12/7: 52    Time 6  Period Weeks    Status On-going    Target Date 12/04/21      PT LONG TERM GOAL #5   Title  improve gait velocity to > 2.0 ft/sec for improved mobility and decreased fall risk    Time 6    Period Weeks    Status Achieved    Target Date 10/24/21      PT LONG TERM GOAL #6   Title vestibular goals to follow if indicated    Status Deferred    Target Date 10/24/21                   Plan - 10/23/21 1251     Clinical Impression Statement Pt has demonstrated limited progress with PT at this time, and was limited by medical issues during this episode of care.  She would benefit from continued PT 1-2x/wk x 4-6 weeks, but would like to wait until after MRI next week.  Pt also has some clinical symptoms of Parkinson's and recommend she see a movement disorder specialist to further evaluate.  Will await MRI results and pending those findings will continue with PT to maximize function.    Personal Factors and Comorbidities Comorbidity 3+    Comorbidities arthritis, hypothyroidism, foot surgery on right, Left THA 2021    Examination-Activity Limitations Stand;Stairs;Squat;Sleep;Sit;Transfers    Examination-Participation Restrictions Other;Community Activity;Yard Work;Driving    Stability/Clinical Decision Making Evolving/Moderate complexity    Rehab Potential Fair    PT Frequency 2x / week    PT Duration 6 weeks    PT Treatment/Interventions ADLs/Self Care Home Management;Cryotherapy;Electrical Stimulation;Iontophoresis 67m/ml Dexamethasone;Moist Heat;Traction;Ultrasound;Balance training;Therapeutic exercise;Therapeutic activities;Functional mobility training;Stair training;Gait training;Neuromuscular re-education;Patient/family education;Passive range of motion;Manual techniques;Dry needling;Taping;Spinal Manipulations;DME Instruction;Canalith Repostioning;Vestibular    PT Next Visit Plan balance/strengthening, manual/modalities PRN for pain    PT Home Exercise Plan Access Code: [email or web address]    Consulted and Agree with Plan of Care Patient             Patient will benefit from  skilled therapeutic intervention in order to improve the following deficits and impairments:  Pain, Difficulty walking, Decreased activity tolerance, Postural dysfunction, Decreased strength, Decreased mobility, Impaired flexibility, Decreased balance, Abnormal gait  Visit Diagnosis: Difficulty walking - Plan: PT plan of care cert/re-cert  Muscle weakness (generalized) - Plan: PT plan of care cert/re-cert  Acute right-sided low back pain with right-sided sciatica - Plan: PT plan of care cert/re-cert  Unsteadiness on feet - Plan: PT plan of care cert/re-cert  Pain in right hip - Plan: PT plan of care cert/re-cert  Dizziness and giddiness - Plan: PT plan of care cert/re-cert     Problem List Patient Active Problem List   Diagnosis Date Noted   Lumbar radiculopathy 08/20/2021   Status post total replacement of left hip 02/03/2020   Unilateral primary osteoarthritis, left hip 12/01/2019   Low back pain 10/27/2019   Right hip pain 07/14/2017   Chest pain 06/11/2015   Hypothyroidism 06/11/2015      SLaureen Abrahams PT, DPT 10/23/21 1:13 PM     [facility] [address] [address]Phone: [phone]  Fax:  [phone] Name: Brenda LEMASTERMRN: [id number] of Birth: 1June 19, 1942    PHYSICAL THERAPY DISCHARGE SUMMARY  Visits from Start of Care: 9  Current functional level related to goals / functional outcomes: See above   Remaining deficits: unknown   Education / Equipment: HEP   Patient agrees to discharge. Patient goals were  partially met. Patient is being discharged due to not returning since the last visit.    Laureen Abrahams, PT, DPT 12/25/21 11:46 AM  [facility] Physical Therapy [address] [place], [place], [address] Phone: [phone]   Fax:  [phone]

## 2021-10-29 DIAGNOSIS — R200 Anesthesia of skin: Secondary | ICD-10-CM | POA: Diagnosis not present

## 2021-10-29 DIAGNOSIS — M4312 Spondylolisthesis, cervical region: Secondary | ICD-10-CM | POA: Diagnosis not present

## 2021-10-29 DIAGNOSIS — M542 Cervicalgia: Secondary | ICD-10-CM | POA: Diagnosis not present

## 2021-10-29 DIAGNOSIS — M2578 Osteophyte, vertebrae: Secondary | ICD-10-CM | POA: Diagnosis not present

## 2021-10-29 DIAGNOSIS — G959 Disease of spinal cord, unspecified: Secondary | ICD-10-CM | POA: Diagnosis not present

## 2021-10-30 DIAGNOSIS — Z124 Encounter for screening for malignant neoplasm of cervix: Secondary | ICD-10-CM | POA: Diagnosis not present

## 2021-10-30 DIAGNOSIS — R87619 Unspecified abnormal cytological findings in specimens from cervix uteri: Secondary | ICD-10-CM | POA: Diagnosis not present

## 2021-10-30 DIAGNOSIS — Z1231 Encounter for screening mammogram for malignant neoplasm of breast: Secondary | ICD-10-CM | POA: Diagnosis not present

## 2021-10-30 DIAGNOSIS — N959 Unspecified menopausal and perimenopausal disorder: Secondary | ICD-10-CM | POA: Diagnosis not present

## 2021-10-30 DIAGNOSIS — Z6824 Body mass index (BMI) 24.0-24.9, adult: Secondary | ICD-10-CM | POA: Diagnosis not present

## 2021-10-30 DIAGNOSIS — Z01419 Encounter for gynecological examination (general) (routine) without abnormal findings: Secondary | ICD-10-CM | POA: Diagnosis not present

## 2021-10-30 DIAGNOSIS — Z01411 Encounter for gynecological examination (general) (routine) with abnormal findings: Secondary | ICD-10-CM | POA: Diagnosis not present

## 2021-11-01 DIAGNOSIS — R269 Unspecified abnormalities of gait and mobility: Secondary | ICD-10-CM | POA: Diagnosis not present

## 2021-11-01 DIAGNOSIS — M542 Cervicalgia: Secondary | ICD-10-CM | POA: Diagnosis not present

## 2021-11-21 DIAGNOSIS — M4314 Spondylolisthesis, thoracic region: Secondary | ICD-10-CM | POA: Diagnosis not present

## 2021-11-21 DIAGNOSIS — M47814 Spondylosis without myelopathy or radiculopathy, thoracic region: Secondary | ICD-10-CM | POA: Diagnosis not present

## 2021-11-21 DIAGNOSIS — M47816 Spondylosis without myelopathy or radiculopathy, lumbar region: Secondary | ICD-10-CM | POA: Diagnosis not present

## 2021-11-21 DIAGNOSIS — M4714 Other spondylosis with myelopathy, thoracic region: Secondary | ICD-10-CM | POA: Diagnosis not present

## 2021-11-21 DIAGNOSIS — M5126 Other intervertebral disc displacement, lumbar region: Secondary | ICD-10-CM | POA: Diagnosis not present

## 2021-12-18 DIAGNOSIS — I10 Essential (primary) hypertension: Secondary | ICD-10-CM | POA: Diagnosis not present

## 2021-12-18 DIAGNOSIS — M5414 Radiculopathy, thoracic region: Secondary | ICD-10-CM | POA: Diagnosis not present

## 2022-01-20 DIAGNOSIS — M5414 Radiculopathy, thoracic region: Secondary | ICD-10-CM | POA: Diagnosis not present

## 2022-03-05 DIAGNOSIS — M5414 Radiculopathy, thoracic region: Secondary | ICD-10-CM | POA: Diagnosis not present

## 2022-03-27 DIAGNOSIS — M47814 Spondylosis without myelopathy or radiculopathy, thoracic region: Secondary | ICD-10-CM | POA: Diagnosis not present

## 2022-07-09 DIAGNOSIS — H43813 Vitreous degeneration, bilateral: Secondary | ICD-10-CM | POA: Diagnosis not present

## 2022-07-28 ENCOUNTER — Ambulatory Visit: Payer: Medicare PPO | Admitting: Podiatry

## 2022-07-28 DIAGNOSIS — L989 Disorder of the skin and subcutaneous tissue, unspecified: Secondary | ICD-10-CM | POA: Diagnosis not present

## 2022-07-28 NOTE — Progress Notes (Signed)
   Chief Complaint  Patient presents with   Callouses    Patient has callous on the right foot    Subjective: 81 y.o. female presenting to the office today as a new patient for evaluation of a symptomatic callus to the plantar aspect of the first MTP joint of the right foot.  It is very tender and painful especially with walking.  Patient states even with shoes it is painful.  She presents for further treatment and evaluation.  Currently she has not done anything for treatment   Past Medical History:  Diagnosis Date   Arthritis    Herniated lumbar intervertebral disc 2022   Hypothyroidism    Incontinence    Seasonal allergies    UTI (urinary tract infection) 06/2021    Past Surgical History:  Procedure Laterality Date   DILATION AND CURETTAGE OF UTERUS     x2   FOOT SURGERY Right    HYSTEROSCOPY WITH D & C N/A 08/14/2016   Procedure: DILATATION AND CURETTAGE /HYSTEROSCOPY;  Surgeon: Brien Few, MD;  Location: [facility];  Service: Gynecology;  Laterality: N/A;   JOINT REPLACEMENT Left 02/03/2020   LUMBAR LAMINECTOMY/ DECOMPRESSION WITH MET-RX Right 08/20/2021   Procedure: Right Thoracic 12-Lumbar 1 Minimally invasive discectomy with metrx;  Surgeon: Judith Part, MD;  Location: [facility];  Service: Neurosurgery;  Laterality: Right;  3C/RM 19   TONSILLECTOMY     TOTAL HIP ARTHROPLASTY Left 02/03/2020   Procedure: LEFT TOTAL HIP ARTHROPLASTY ANTERIOR APPROACH;  Surgeon: Mcarthur Rossetti, MD;  Location: [facility];  Service: Orthopedics;  Laterality: Left;    No Known Allergies   Objective:  Physical Exam General: Alert and oriented x3 in no acute distress  Dermatology: Hyperkeratotic lesion(s) present on the plantar aspect of the first MTP joint right foot. Pain on palpation with a central nucleated core noted. Skin is warm, dry and supple bilateral lower extremities. Negative for open lesions or macerations.  Vascular: Palpable pedal pulses bilaterally. No edema or  erythema noted. Capillary refill within normal limits.  Neurological: Epicritic and protective threshold grossly intact bilaterally.   Musculoskeletal Exam: Pain on palpation at the keratotic lesion(s) noted. Range of motion within normal limits bilateral. Muscle strength 5/5 in all groups bilateral.  Assessment: 1.  Symptomatic callus; benign skin lesion   Plan of Care:  1. Patient evaluated 2. Excisional debridement of keratoic lesion(s) using a chisel blade was performed without incident.  3.  Salicylic acid applied.  Dressed area with light dressing. 4.  Recommend OTC corn callus remover as needed  5.  Patient is to return to the clinic PRN.   Edrick Kins, DPM [facility]  Dr. Edrick Kins, DPM    [address], [place] [address]                Office [phone]  Fax [phone]

## 2022-10-16 DIAGNOSIS — Z6825 Body mass index (BMI) 25.0-25.9, adult: Secondary | ICD-10-CM | POA: Diagnosis not present

## 2022-10-16 DIAGNOSIS — M5414 Radiculopathy, thoracic region: Secondary | ICD-10-CM | POA: Diagnosis not present

## 2022-10-22 DIAGNOSIS — M546 Pain in thoracic spine: Secondary | ICD-10-CM | POA: Diagnosis not present

## 2022-10-22 DIAGNOSIS — M5414 Radiculopathy, thoracic region: Secondary | ICD-10-CM | POA: Diagnosis not present

## 2022-10-29 DIAGNOSIS — M8008XA Age-related osteoporosis with current pathological fracture, vertebra(e), initial encounter for fracture: Secondary | ICD-10-CM | POA: Diagnosis not present

## 2022-11-07 DIAGNOSIS — E039 Hypothyroidism, unspecified: Secondary | ICD-10-CM | POA: Diagnosis not present

## 2022-11-07 DIAGNOSIS — Z01411 Encounter for gynecological examination (general) (routine) with abnormal findings: Secondary | ICD-10-CM | POA: Diagnosis not present

## 2022-11-07 DIAGNOSIS — Z01419 Encounter for gynecological examination (general) (routine) without abnormal findings: Secondary | ICD-10-CM | POA: Diagnosis not present

## 2022-11-07 DIAGNOSIS — N959 Unspecified menopausal and perimenopausal disorder: Secondary | ICD-10-CM | POA: Diagnosis not present

## 2022-11-07 DIAGNOSIS — Z1231 Encounter for screening mammogram for malignant neoplasm of breast: Secondary | ICD-10-CM | POA: Diagnosis not present

## 2022-11-07 DIAGNOSIS — Z124 Encounter for screening for malignant neoplasm of cervix: Secondary | ICD-10-CM | POA: Diagnosis not present

## 2022-11-18 DIAGNOSIS — M8589 Other specified disorders of bone density and structure, multiple sites: Secondary | ICD-10-CM | POA: Diagnosis not present

## 2022-11-27 DIAGNOSIS — R7989 Other specified abnormal findings of blood chemistry: Secondary | ICD-10-CM | POA: Diagnosis not present

## 2022-11-27 DIAGNOSIS — R030 Elevated blood-pressure reading, without diagnosis of hypertension: Secondary | ICD-10-CM | POA: Diagnosis not present

## 2022-11-27 DIAGNOSIS — E559 Vitamin D deficiency, unspecified: Secondary | ICD-10-CM | POA: Diagnosis not present

## 2022-11-27 DIAGNOSIS — E039 Hypothyroidism, unspecified: Secondary | ICD-10-CM | POA: Diagnosis not present

## 2022-12-11 DIAGNOSIS — R82998 Other abnormal findings in urine: Secondary | ICD-10-CM | POA: Diagnosis not present

## 2022-12-11 DIAGNOSIS — R269 Unspecified abnormalities of gait and mobility: Secondary | ICD-10-CM | POA: Diagnosis not present

## 2022-12-11 DIAGNOSIS — S32010D Wedge compression fracture of first lumbar vertebra, subsequent encounter for fracture with routine healing: Secondary | ICD-10-CM | POA: Diagnosis not present

## 2022-12-11 DIAGNOSIS — Z96642 Presence of left artificial hip joint: Secondary | ICD-10-CM | POA: Diagnosis not present

## 2022-12-11 DIAGNOSIS — E039 Hypothyroidism, unspecified: Secondary | ICD-10-CM | POA: Diagnosis not present

## 2022-12-11 DIAGNOSIS — E559 Vitamin D deficiency, unspecified: Secondary | ICD-10-CM | POA: Diagnosis not present

## 2022-12-11 DIAGNOSIS — M810 Age-related osteoporosis without current pathological fracture: Secondary | ICD-10-CM | POA: Diagnosis not present

## 2022-12-11 DIAGNOSIS — Z0000 Encounter for general adult medical examination without abnormal findings: Secondary | ICD-10-CM | POA: Diagnosis not present

## 2022-12-11 DIAGNOSIS — Z79890 Hormone replacement therapy (postmenopausal): Secondary | ICD-10-CM | POA: Diagnosis not present

## 2022-12-11 DIAGNOSIS — R030 Elevated blood-pressure reading, without diagnosis of hypertension: Secondary | ICD-10-CM | POA: Diagnosis not present

## 2023-01-05 ENCOUNTER — Telehealth: Payer: Self-pay | Admitting: Orthopaedic Surgery

## 2023-01-05 NOTE — Telephone Encounter (Signed)
Patient called. She is going to the dentist in the morning. 01/06/23. Dr. Ronnald Ramp DDS needs a letter stating if pre med is needed or not. The fax number is [phone]

## 2023-01-05 NOTE — Telephone Encounter (Signed)
Faxed to provided number  

## 2023-01-21 DIAGNOSIS — E039 Hypothyroidism, unspecified: Secondary | ICD-10-CM | POA: Diagnosis not present

## 2023-01-29 DIAGNOSIS — C44311 Basal cell carcinoma of skin of nose: Secondary | ICD-10-CM | POA: Diagnosis not present

## 2023-01-29 DIAGNOSIS — Z85828 Personal history of other malignant neoplasm of skin: Secondary | ICD-10-CM | POA: Diagnosis not present

## 2023-01-29 DIAGNOSIS — L820 Inflamed seborrheic keratosis: Secondary | ICD-10-CM | POA: Diagnosis not present

## 2023-02-04 DIAGNOSIS — M810 Age-related osteoporosis without current pathological fracture: Secondary | ICD-10-CM | POA: Diagnosis not present

## 2023-02-04 DIAGNOSIS — R030 Elevated blood-pressure reading, without diagnosis of hypertension: Secondary | ICD-10-CM | POA: Diagnosis not present

## 2023-02-05 ENCOUNTER — Ambulatory Visit: Payer: Medicare PPO | Admitting: Rehabilitative and Restorative Service Providers"

## 2023-02-05 ENCOUNTER — Encounter: Payer: Self-pay | Admitting: Rehabilitative and Restorative Service Providers"

## 2023-02-05 DIAGNOSIS — R2689 Other abnormalities of gait and mobility: Secondary | ICD-10-CM | POA: Diagnosis not present

## 2023-02-05 DIAGNOSIS — R262 Difficulty in walking, not elsewhere classified: Secondary | ICD-10-CM | POA: Diagnosis not present

## 2023-02-05 DIAGNOSIS — R293 Abnormal posture: Secondary | ICD-10-CM

## 2023-02-05 DIAGNOSIS — M5459 Other low back pain: Secondary | ICD-10-CM

## 2023-02-05 NOTE — Therapy (Signed)
OUTPATIENT PHYSICAL THERAPY THORACOLUMBAR EVALUATION   Patient Name: Brenda Ortiz MRN: [id number] DOB:1941/09/28, 82 y.o., female Today's Date: 02/05/2023  Referring diagnosis?  Diagnosis  M54.9 (ICD-10-CM) - Back pain   Treatment diagnosis? (if different than referring diagnosis) R29.3   R26.2   R26.89   M54.59 What was this (referring dx) caused by? []  Surgery []  Fall [x]  Ongoing issue []  Arthritis []  Other: ____________  Laterality: []  Rt []  Lt [x]  Both  Check all possible CPT codes:  *CHOOSE 10 OR LESS*    []  97110 (Therapeutic Exercise)  []  92507 (SLP Treatment)  []  97112 (Neuro Re-ed)   []  92526 (Swallowing Treatment)   []  97116 (Gait Training)   []  [id number] (Cognitive Training, 1st 15 minutes) []  97140 (Manual Therapy)   []  97130 (Cognitive Training, each add'l 15 minutes)  []  97164 (Re-evaluation)                              []  Other, List CPT Code ____________  []  97530 (Therapeutic Activities)     []  97535 (Self Care)   [x]  All codes above (97110 - 97535)  []  97012 (Mechanical Traction)  []  97014 (E-stim Unattended)  []  97032 (E-stim manual)  []  97033 (Ionto)  []  97035 (Ultrasound) []  97750 (Physical Performance Training) []  [id number] (Aquatic Therapy) []  97016 (Vasopneumatic Device) []  [id number] (Paraffin) []  97034 (Contrast Bath) []  97597 (Wound Care 1st 20 sq cm) []  97598 (Wound Care each add'l 20 sq cm) []  97760 (Orthotic Fabrication, Fitting, Training Initial) []  [id number] (Prosthetic Management and Training Initial) []  [id number] (Orthotic or Prosthetic Training/ Modification Subsequent)   END OF SESSION:  PT End of Session - 02/05/23 1620     Visit Number 1    Number of Visits 16    Date for PT Re-Evaluation 04/02/23    Authorization Type [employer]    Authorization - Number of Visits 12    Progress Note Due on Visit 12    PT Start Time 1400    PT Stop Time 1430    PT Time Calculation (min) 30 min    Activity Tolerance Patient tolerated treatment well;No  increased pain;Patient limited by fatigue    Behavior During Therapy [facility] for tasks assessed/performed             Past Medical History:  Diagnosis Date   Arthritis    Herniated lumbar intervertebral disc 2022   Hypothyroidism    Incontinence    Seasonal allergies    UTI (urinary tract infection) 06/2021   Past Surgical History:  Procedure Laterality Date   DILATION AND CURETTAGE OF UTERUS     x2   FOOT SURGERY Right    HYSTEROSCOPY WITH D & C N/A 08/14/2016   Procedure: DILATATION AND CURETTAGE /HYSTEROSCOPY;  Surgeon: Brien Few, MD;  Location: [facility];  Service: Gynecology;  Laterality: N/A;   JOINT REPLACEMENT Left 02/03/2020   LUMBAR LAMINECTOMY/ DECOMPRESSION WITH MET-RX Right 08/20/2021   Procedure: Right Thoracic 12-Lumbar 1 Minimally invasive discectomy with metrx;  Surgeon: Judith Part, MD;  Location: [facility];  Service: Neurosurgery;  Laterality: Right;  3C/RM 19   TONSILLECTOMY     TOTAL HIP ARTHROPLASTY Left 02/03/2020   Procedure: LEFT TOTAL HIP ARTHROPLASTY ANTERIOR APPROACH;  Surgeon: Mcarthur Rossetti, MD;  Location: [facility];  Service: Orthopedics;  Laterality: Left;   Patient Active Problem List   Diagnosis Date Noted   Lumbar radiculopathy 08/20/2021   Status  post total replacement of left hip 02/03/2020   Unilateral primary osteoarthritis, left hip 12/01/2019   Low back pain 10/27/2019   Right hip pain 07/14/2017   Chest pain 06/11/2015   Hypothyroidism 06/11/2015    PCP: Prince Solian, MD  REFERRING PROVIDER: Prince Solian, MD   REFERRING DIAG:  Diagnosis  M54.9 (ICD-10-CM) - Back pain    Rationale for Evaluation and Treatment: Rehabilitation  THERAPY DIAG:  Abnormal posture - Plan: PT plan of care cert/re-cert  Difficulty in walking, not elsewhere classified - Plan: PT plan of care cert/re-cert  Other abnormalities of gait and mobility - Plan: PT plan of care cert/re-cert  Other low back pain - Plan: PT plan of care  cert/re-cert  ONSET DATE: Chronic  SUBJECTIVE:                                                                                                                                                                                           SUBJECTIVE STATEMENT: Tyreisha has several compression fractures (3).  Back pain and balance are the issues presently.  She has been using a cane for several months.    PERTINENT HISTORY:  Previous right foot surgery, lumbar decompression/laminectomy in 2022, left THA in 2021  PAIN:  Are you having pain? Yes: NPRS scale: 0-5/10 this week on a 10/10 Pain location: Right > left low back pain Pain description: Achy, sore Aggravating factors: Standing, walking, weight-bearing Relieving factors: Sit, rest  PRECAUTIONS: Back and Fall  WEIGHT BEARING RESTRICTIONS: No  FALLS:  Has patient fallen in last 6 months? Yes. Number of falls 1, no injuries  LIVING ENVIRONMENT: Lives with: lives with their family and lives with their spouse Lives in: House/apartment Stairs:  Does OK in a 3 story house Has following equipment at home: Single point cane and Grab bars  OCCUPATION: Retired  PLOF: Independent with household mobility with device  PATIENT GOALS: Return to walking without the cane with improved standing and walking endurance  NEXT MD VISIT: January 2025  OBJECTIVE:   DIAGNOSTIC FINDINGS:  IMPRESSION: 1. T12-L1 severe right neural foraminal narrowing. 2. L5-S1 mild right neural foraminal narrowing.  PATIENT SURVEYS:  FOTO 51 (Goal 63) in 11 visits  SCREENING FOR RED FLAGS: Bowel or bladder incontinence: No Spinal tumors: No Cauda equina syndrome: No Compression fracture: Yes: 3  COGNITION: Overall cognitive status: Within functional limits for tasks assessed     SENSATION: No complaints of peripheral pains or paresthesias.  Notes some left LE edema.  MUSCLE LENGTH: Hamstrings:  Thomas test:   POSTURE: rounded shoulders, forward  head, and decreased lumbar lordosis   LUMBAR ROM:  AROM 02/05/2023  Flexion   Extension 10  Right lateral flexion   Left lateral flexion   Right rotation   Left rotation    (Blank rows = not tested)  LOWER EXTREMITY ROM:     Active  Left/Right 02/05/2023 Left eval  Hip flexion    Hip extension    Hip abduction    Hip adduction    Hip internal rotation    Hip external rotation    Knee flexion    Knee extension    Ankle dorsiflexion    Ankle plantarflexion    Ankle inversion    Ankle eversion     (Blank rows = not tested)  LOWER EXTREMITY STRENGTH:    MMT Right eval Left eval  Hip flexion    Hip extension    Hip abduction    Hip adduction    Hip internal rotation    Hip external rotation    Knee flexion    Knee extension    Ankle dorsiflexion    Ankle plantarflexion    Ankle inversion    Ankle eversion     (Blank rows = not tested)    GAIT: Distance walked: In the clinic Assistive device utilized: Single point cane Level of assistance: Complete Independence Comments: Sheylin notes that she holds onto her husband's arm when she walks without her cane  TODAY'S TREATMENT:                                                                                                                              DATE: 02/05/2023 Lumbar extension AROM 10 x 3 seconds Shoulder blade pinches 10 x 5 seconds Sit to stand 5 times slow eccentrics Heel-to-toe raises in doorway, no hands 10 x 3 seconds   PATIENT EDUCATION:  Education details: Briefly reviewed home exercise program results and encouraged Verdia to show up on time for her appointment as she was 15 minutes late today Person educated: Patient Education method: Explanation, Demonstration, Tactile cues, Verbal cues, and Handouts Education comprehension: verbalized understanding, returned demonstration, verbal cues required, tactile cues required, and needs further education  HOME EXERCISE PROGRAM: Access Code:  [email or web address] URL: [email or web address] Date: 02/05/2023 Prepared by: Vista Mink  Exercises - Standing Lumbar Extension at [place] 5 x daily - 7 x weekly - 1 sets - 5 reps - 3 seconds hold - Standing Scapular Retraction  - 5 x daily - 7 x weekly - 1 sets - 5 reps - 5 second hold - Heel Toe Raises with Counter Support  - 2 x daily - 7 x weekly - 1 sets - 20 reps - 3 seconds hold - Sit to Stand with Armchair  - 2 x daily - 7 x weekly - 1 sets - 5 reps  ASSESSMENT:  CLINICAL IMPRESSION: Patient is a 82  y.o. female who was seen today for physical therapy evaluation and treatment for low back pain.   OBJECTIVE IMPAIRMENTS: Abnormal  gait, decreased activity tolerance, decreased balance, decreased endurance, decreased knowledge of condition, difficulty walking, decreased ROM, decreased strength, decreased safety awareness, impaired perceived functional ability, improper body mechanics, postural dysfunction, and pain.   ACTIVITY LIMITATIONS: carrying, lifting, bending, sitting, standing, squatting, and locomotion level  PARTICIPATION LIMITATIONS: meal prep, cleaning, and community activity  PERSONAL FACTORS: Previous right foot surgery, lumbar decompression/laminectomy in 2022, left THA in 2021 are also affecting patient's functional outcome.   REHAB POTENTIAL: Good  CLINICAL DECISION MAKING: Stable/uncomplicated  EVALUATION COMPLEXITY: Low   GOALS: Goals reviewed with patient? Yes  SHORT TERM GOALS: Target date: 03/05/2023  Yumalai will be independent with her day 1 home exercise program Baseline: Started 02/05/2023 Goal status: INITIAL  2.  Establish a baseline Berg balance score Baseline: Deferred today secondary to Chrysten being 15 minutes late for her appointment Goal status: INITIAL   LONG TERM GOALS: Target date: 04/02/2023  Improve FOTO to 63 in 11 visits Baseline: 51 Goal status: INITIAL  2.  Amberia will report low back pain consistently  0-3 out of 10 on the numeric pain rating scale Baseline: 0-5 out of 10 Goal status: INITIAL  3.  Improve Berg balance scale scores to allow Anneli to be independent and safe with an appropriate assistive device at discharge Baseline: Will be assessed visit 2 secondary to this patient arriving 15 minutes late at evaluation Goal status: INITIAL  4.  Oumou will be able to demonstrate appropriate sitting and standing postures to help avoid increases in low back pain Baseline: Education will be started visit 2 Goal status: INITIAL  5.  Delorse will be independent with her long-term home exercise program at discharge Baseline: Started 02/05/2023 Goal status: INITIAL   PLAN:  PT FREQUENCY: 2x/week  PT DURATION: 8 weeks  PLANNED INTERVENTIONS: Therapeutic exercises, Therapeutic activity, Neuromuscular re-education, Balance training, Gait training, Patient/Family education, Self Care, Stair training, Vestibular training, Canalith repositioning, Dry Needling, Spinal mobilization, Cryotherapy, and Manual therapy.  PLAN FOR NEXT SESSION: Complete a Berg Balance Scale, consider assessing lower extremity flexibility.  Progress postural, lower extremity and low back strength to assist with meeting long-term goals.   Farley Ly, PT, MPT 02/05/2023, 4:33 PM

## 2023-02-12 ENCOUNTER — Ambulatory Visit: Payer: Medicare PPO | Admitting: Rehabilitative and Restorative Service Providers"

## 2023-02-12 ENCOUNTER — Encounter: Payer: Self-pay | Admitting: Rehabilitative and Restorative Service Providers"

## 2023-02-12 DIAGNOSIS — R262 Difficulty in walking, not elsewhere classified: Secondary | ICD-10-CM

## 2023-02-12 DIAGNOSIS — M5459 Other low back pain: Secondary | ICD-10-CM

## 2023-02-12 DIAGNOSIS — R293 Abnormal posture: Secondary | ICD-10-CM | POA: Diagnosis not present

## 2023-02-12 DIAGNOSIS — R2689 Other abnormalities of gait and mobility: Secondary | ICD-10-CM | POA: Diagnosis not present

## 2023-02-12 NOTE — Therapy (Signed)
OUTPATIENT PHYSICAL THERAPY THORACOLUMBAR TREATMENT  Patient Name: Brenda Ortiz MRN: [id number] DOB:Feb 09, 1941, 82 y.o., female Today's Date: 02/12/2023  Referring diagnosis?  Diagnosis  M54.9 (ICD-10-CM) - Back pain   END OF SESSION:  PT End of Session - 02/12/23 1134     Visit Number 2    Number of Visits 16    Date for PT Re-Evaluation 04/02/23    Authorization Type HUMA[employer]    Authorization - Visit Number 2    Authorization - Number of Visits 12    Progress Note Due on Visit 12    PT Start Time 0935    PT Stop Time 1015    PT Time Calculation (min) 40 min    Activity Tolerance Patient tolerated treatment well;No increased pain    Behavior During Therapy [facility] for tasks assessed/performed             Past Medical History:  Diagnosis Date   Arthritis    Herniated lumbar intervertebral disc 2022   Hypothyroidism    Incontinence    Seasonal allergies    UTI (urinary tract infection) 06/2021   Past Surgical History:  Procedure Laterality Date   DILATION AND CURETTAGE OF UTERUS     x2   FOOT SURGERY Right    HYSTEROSCOPY WITH D & C N/A 08/14/2016   Procedure: DILATATION AND CURETTAGE /HYSTEROSCOPY;  Surgeon: Brien Few, MD;  Location: [place] [facility];  Service: Gynecology;  Laterality: N/A;   JOINT REPLACEMENT Left 02/03/2020   LUMBAR LAMINECTOMY/ DECOMPRESSION WITH MET-RX Right 08/20/2021   Procedure: Right Thoracic 12-Lumbar 1 Minimally invasive discectomy with metrx;  Surgeon: Judith Part, MD;  Location: [place];  Service: Neurosurgery;  Laterality: Right;  3C/RM 19   TONSILLECTOMY     TOTAL HIP ARTHROPLASTY Left 02/03/2020   Procedure: LEFT TOTAL HIP ARTHROPLASTY ANTERIOR APPROACH;  Surgeon: Mcarthur Rossetti, MD;  Location: [facility];  Service: Orthopedics;  Laterality: Left;   Patient Active Problem List   Diagnosis Date Noted   Lumbar radiculopathy 08/20/2021   Status post total replacement of left hip 02/03/2020   Unilateral primary  osteoarthritis, left hip 12/01/2019   Low back pain 10/27/2019   Right hip pain 07/14/2017   Chest pain 06/11/2015   Hypothyroidism 06/11/2015    PCP: Prince Solian, MD  REFERRING PROVIDER: Prince Solian, MD   REFERRING DIAG:  Diagnosis  M54.9 (ICD-10-CM) - Back pain    Rationale for Evaluation and Treatment: Rehabilitation  THERAPY DIAG:  Abnormal posture  Difficulty in walking, not elsewhere classified  Other abnormalities of gait and mobility  Other low back pain  ONSET DATE: Chronic  SUBJECTIVE:  SUBJECTIVE STATEMENT: Brenda Ortiz has several compression fractures (3).  Back pain and balance are the issues presently.  She has been using a cane for several months.  Early HEP compliance has been "fair."  PERTINENT HISTORY:  Previous right foot surgery, lumbar decompression/laminectomy in 2022, left THA in 2021  PAIN:  Are you having pain? Yes: NPRS scale: 0-5/10 this week on a 10/10 Pain location: Right > left low back pain Pain description: Achy, sore Aggravating factors: Standing, walking, weight-bearing Relieving factors: Sit, rest  PRECAUTIONS: Back and Fall  WEIGHT BEARING RESTRICTIONS: No  FALLS:  Has patient fallen in last 6 months? Yes. Number of falls 1, no injuries  LIVING ENVIRONMENT: Lives with: lives with their family and lives with their spouse Lives in: House/apartment Stairs:  Does OK in a 3 story house Has following equipment at home: Single point cane and Grab bars  OCCUPATION: Retired  PLOF: Independent with household mobility with device  PATIENT GOALS: Return to walking without the cane with improved standing and walking endurance  NEXT MD VISIT: January 2025  OBJECTIVE:   DIAGNOSTIC FINDINGS:  IMPRESSION: 1. T12-L1 severe right neural  foraminal narrowing. 2. L5-S1 mild right neural foraminal narrowing.  PATIENT SURVEYS:  FOTO 51 (Goal 63) in 11 visits  SCREENING FOR RED FLAGS: Bowel or bladder incontinence: No Spinal tumors: No Cauda equina syndrome: No Compression fracture: Yes: 3  COGNITION: Overall cognitive status: Within functional limits for tasks assessed     SENSATION: No complaints of peripheral pains or paresthesias.  Notes some left LE edema.  MUSCLE LENGTH: Hamstrings:  Thomas test:   POSTURE: rounded shoulders, forward head, and decreased lumbar lordosis   LUMBAR ROM:   AROM 02/05/2023  Flexion   Extension 10  Right lateral flexion   Left lateral flexion   Right rotation   Left rotation    (Blank rows = not tested)  LOWER EXTREMITY ROM:     Active  Left/Right 02/05/2023 Left eval  Hip flexion    Hip extension    Hip abduction    Hip adduction    Hip internal rotation    Hip external rotation    Knee flexion    Knee extension    Ankle dorsiflexion    Ankle plantarflexion    Ankle inversion    Ankle eversion     (Blank rows = not tested)  LOWER EXTREMITY STRENGTH:    MMT Right eval Left eval  Hip flexion    Hip extension    Hip abduction    Hip adduction    Hip internal rotation    Hip external rotation    Knee flexion    Knee extension    Ankle dorsiflexion    Ankle plantarflexion    Ankle inversion    Ankle eversion     (Blank rows = not tested)  02/12/2023 Brenda Ortiz   [facility] PT Assessment - 02/12/23 0001       Balance   Balance Assessed Yes      Standardized Balance Assessment   Standardized Balance Assessment Berg Balance Test    Balance Master Testing Sensory Organization Test      Berg Balance Test   Sit to Stand Able to stand  independently using hands    Standing Unsupported Able to stand safely 2 minutes    Sitting with Back Unsupported but Feet Supported on Floor or Stool Able to sit safely and securely 2 minutes    Stand to Sit Controls  descent by  using hands    Transfers Able to transfer safely, definite need of hands    Standing Unsupported with Eyes Closed Able to stand 10 seconds with supervision    Standing Unsupported with Feet Together Able to place feet together independently and stand 1 minute safely    From Standing, Reach Forward with Outstretched Arm Loses balance while trying/requires external support    From Standing Position, Pick up Object from Floor Unable to pick up and needs supervision    From Standing Position, Turn to Look Behind Over each Shoulder Turn sideways only but maintains balance    Turn 360 Degrees Needs close supervision or verbal cueing    Standing Unsupported, Alternately Place Feet on Step/Stool Needs assistance to keep from falling or unable to try    Standing Unsupported, One Foot in Front Needs help to step but can hold 15 seconds    Standing on One Leg Tries to lift leg/unable to hold 3 seconds but remains standing independently    Total Score 30              GAIT: Distance walked: In the clinic Assistive device utilized: Single point cane Level of assistance: Complete Independence Comments: Donetta notes that she holds onto her husband's arm when she walks without her cane  TODAY'S TREATMENT:                                                                                                                              DATE:  02/12/2023 Lumbar extension AROM 10 x 3 seconds Shoulder blade pinches 10 x 5 seconds Sit to stand 5 times slow eccentrics Heel-to-toe raises in doorway, no hands 10 x 3 seconds  Neuromuscular re-education: Tandem balance 6X 30 seconds, encourage step strategy, work from wide to narrow [employer] 30/56   02/05/2023 Lumbar extension AROM 10 x 3 seconds Shoulder blade pinches 10 x 5 seconds Sit to stand 5 times slow eccentrics Heel-to-toe raises in doorway, no hands 10 x 3 seconds   PATIENT EDUCATION:  Education details: Briefly reviewed home  exercise program results and encouraged Genel to show up on time for her appointment as she was 15 minutes late today Person educated: Patient Education method: Explanation, Demonstration, Tactile cues, Verbal cues, and Handouts Education comprehension: verbalized understanding, returned demonstration, verbal cues required, tactile cues required, and needs further education  HOME EXERCISE PROGRAM: Access Code: [email or web address] URL: [email or web address] Date: 02/12/2023 Prepared by: Vista Mink  Exercises - Standing Lumbar Extension at [place] 5 x daily - 7 x weekly - 1 sets - 5 reps - 3 seconds hold - Standing Scapular Retraction  - 5 x daily - 7 x weekly - 1 sets - 5 reps - 5 second hold - Heel Toe Raises with Counter Support  - 2 x daily - 7 x weekly - 1 sets - 20 reps - 3 seconds hold - Sit to Stand with Armchair  -  2 x daily - 7 x weekly - 1 sets - 5 reps - Tandem Stance  - 2 x daily - 7 x weekly - 1 sets - 5 reps - 20 second hold  ASSESSMENT:  CLINICAL IMPRESSION: Giabella reports "fair" HEP compliance.  We completed a Berg Balance Scale and she scored a 30/56, making Garcelle a high risk of future falls and appropriate for a walker full-time.  Kellijo prefers the cane and knows balance is a priority with her current PT.  Added an additional balance activity to complement her starter program from day 1.  Continue LE strength, balance, gait and back activities to meet long-term goals.  OBJECTIVE IMPAIRMENTS: Abnormal gait, decreased activity tolerance, decreased balance, decreased endurance, decreased knowledge of condition, difficulty walking, decreased ROM, decreased strength, decreased safety awareness, impaired perceived functional ability, improper body mechanics, postural dysfunction, and pain.   ACTIVITY LIMITATIONS: carrying, lifting, bending, sitting, standing, squatting, and locomotion level  PARTICIPATION LIMITATIONS: meal prep, cleaning, and  community activity  PERSONAL FACTORS: Previous right foot surgery, lumbar decompression/laminectomy in 2022, left THA in 2021 are also affecting patient's functional outcome.   REHAB POTENTIAL: Good  CLINICAL DECISION MAKING: Stable/uncomplicated  EVALUATION COMPLEXITY: Low   GOALS: Goals reviewed with patient? Yes  SHORT TERM GOALS: Target date: 03/05/2023  Bergen will be independent with her day 1 home exercise program Baseline: Started 02/05/2023 Goal status: On Going 02/12/2023  2.  Establish a baseline Berg balance score Baseline: Deferred today secondary to Chavely being 15 minutes late for her appointment Goal status: Met 02/12/2023   LONG TERM GOALS: Target date: 04/02/2023  Improve FOTO to 63 in 11 visits Baseline: 51 Goal status: INITIAL  2.  Roshani will report low back pain consistently 0-3 out of 10 on the numeric pain rating scale Baseline: 0-5 out of 10 Goal status: On Going 02/12/2023  3.  Improve Berg balance scale scores to allow Katalea to be independent and safe with an appropriate assistive device at discharge Baseline: Will be assessed visit 2 secondary to this patient arriving 15 minutes late at evaluation Goal status: INITIAL  4.  Chanise will be able to demonstrate appropriate sitting and standing postures to help avoid increases in low back pain Baseline: Education will be started visit 2 Goal status: On Going 02/12/2023  5.  Abigayle will be independent with her long-term home exercise program at discharge Baseline: Started 02/05/2023 Goal status: INITIAL   PLAN:  PT FREQUENCY: 2x/week  PT DURATION: 8 weeks  PLANNED INTERVENTIONS: Therapeutic exercises, Therapeutic activity, Neuromuscular re-education, Balance training, Gait training, Patient/Family education, Self Care, Stair training, Vestibular training, Canalith repositioning, Dry Needling, Spinal mobilization, Cryotherapy, and Manual therapy.  PLAN FOR NEXT SESSION: Berg and  balance drills, consider assessing lower extremity flexibility.  Progress postural, lower extremity and low back strength to assist with meeting long-term goals.   Farley Ly, PT, MPT 02/12/2023, 11:39 AM

## 2023-02-18 ENCOUNTER — Ambulatory Visit: Payer: Medicare PPO | Admitting: Rehabilitative and Restorative Service Providers"

## 2023-02-18 ENCOUNTER — Encounter: Payer: Self-pay | Admitting: Rehabilitative and Restorative Service Providers"

## 2023-02-18 DIAGNOSIS — R293 Abnormal posture: Secondary | ICD-10-CM | POA: Diagnosis not present

## 2023-02-18 DIAGNOSIS — M5459 Other low back pain: Secondary | ICD-10-CM

## 2023-02-18 DIAGNOSIS — R262 Difficulty in walking, not elsewhere classified: Secondary | ICD-10-CM

## 2023-02-18 DIAGNOSIS — R2689 Other abnormalities of gait and mobility: Secondary | ICD-10-CM

## 2023-02-18 NOTE — Therapy (Signed)
OUTPATIENT PHYSICAL THERAPY THORACOLUMBAR TREATMENT  Patient Name: Brenda Ortiz MRN: [id number] DOB:Oct 24, 1941, 82 y.o., female Today's Date: 02/18/2023  Referring diagnosis?  Diagnosis  M54.9 (ICD-10-CM) - Back pain   END OF SESSION:  PT End of Session - 02/18/23 1714     Visit Number 3    Number of Visits 16    Date for PT Re-Evaluation 04/02/23    Authorization Type HUMANA    Authorization - Visit Number 3    Authorization - Number of Visits 12    Progress Note Due on Visit 12    PT Start Time [id number]    PT Stop Time 1557    PT Time Calculation (min) 40 min    Equipment Utilized During Treatment Gait belt    Activity Tolerance Patient tolerated treatment well;No increased pain;Patient limited by fatigue    Behavior During Therapy [facility] for tasks assessed/performed              Past Medical History:  Diagnosis Date   Arthritis    Herniated lumbar intervertebral disc 2022   Hypothyroidism    Incontinence    Seasonal allergies    UTI (urinary tract infection) 06/2021   Past Surgical History:  Procedure Laterality Date   DILATION AND CURETTAGE OF UTERUS     x2   FOOT SURGERY Right    HYSTEROSCOPY WITH D & C N/A 08/14/2016   Procedure: DILATATION AND CURETTAGE /HYSTEROSCOPY;  Surgeon: Brien Few, MD;  Location: [place] [facility];  Service: Gynecology;  Laterality: N/A;   JOINT REPLACEMENT Left 02/03/2020   LUMBAR LAMINECTOMY/ DECOMPRESSION WITH MET-RX Right 08/20/2021   Procedure: Right Thoracic 12-Lumbar 1 Minimally invasive discectomy with metrx;  Surgeon: Judith Part, MD;  Location: [place];  Service: Neurosurgery;  Laterality: Right;  3C/RM 19   TONSILLECTOMY     TOTAL HIP ARTHROPLASTY Left 02/03/2020   Procedure: LEFT TOTAL HIP ARTHROPLASTY ANTERIOR APPROACH;  Surgeon: Mcarthur Rossetti, MD;  Location: [facility];  Service: Orthopedics;  Laterality: Left;   Patient Active Problem List   Diagnosis Date Noted   Lumbar radiculopathy 08/20/2021   Status  post total replacement of left hip 02/03/2020   Unilateral primary osteoarthritis, left hip 12/01/2019   Low back pain 10/27/2019   Right hip pain 07/14/2017   Chest pain 06/11/2015   Hypothyroidism 06/11/2015    PCP: Prince Solian, MD  REFERRING PROVIDER: Prince Solian, MD   REFERRING DIAG:  Diagnosis  M54.9 (ICD-10-CM) - Back pain    Rationale for Evaluation and Treatment: Rehabilitation  THERAPY DIAG:  Abnormal posture  Difficulty in walking, not elsewhere classified  Other abnormalities of gait and mobility  Other low back pain  ONSET DATE: Chronic  SUBJECTIVE:  SUBJECTIVE STATEMENT: Airalynn has several compression fractures (3) that are contributing to her back pain.  She reports better HEP compliance this week.  PERTINENT HISTORY:  Previous right foot surgery, lumbar decompression/laminectomy in 2022, left THA in 2021  PAIN:  Are you having pain? Yes: NPRS scale: 0-5/10 this week on a 10/10 Pain location: Right > left low back pain Pain description: Achy, sore Aggravating factors: Standing, walking, weight-bearing Relieving factors: Sit, rest  PRECAUTIONS: Back and Fall  WEIGHT BEARING RESTRICTIONS: No  FALLS:  Has patient fallen in last 6 months? Yes. Number of falls 1, no injuries  LIVING ENVIRONMENT: Lives with: lives with their family and lives with their spouse Lives in: House/apartment Stairs:  Does OK in a 3 story house Has following equipment at home: Single point cane and Grab bars  OCCUPATION: Retired  PLOF: Independent with household mobility with device  PATIENT GOALS: Return to walking without the cane with improved standing and walking endurance  NEXT MD VISIT: January 2025  OBJECTIVE:   DIAGNOSTIC FINDINGS:  IMPRESSION: 1. T12-L1 severe  right neural foraminal narrowing. 2. L5-S1 mild right neural foraminal narrowing.  PATIENT SURVEYS:  FOTO 51 (Goal 63) in 11 visits  SCREENING FOR RED FLAGS: Bowel or bladder incontinence: No Spinal tumors: No Cauda equina syndrome: No Compression fracture: Yes: 3  COGNITION: Overall cognitive status: Within functional limits for tasks assessed     SENSATION: No complaints of peripheral pains or paresthesias.  Notes some left LE edema.  MUSCLE LENGTH: Hamstrings:  Thomas test:   POSTURE: rounded shoulders, forward head, and decreased lumbar lordosis   LUMBAR ROM:   AROM 02/05/2023  Flexion   Extension 10  Right lateral flexion   Left lateral flexion   Right rotation   Left rotation    (Blank rows = not tested)  LOWER EXTREMITY ROM:     Active  Left/Right 02/05/2023 Left eval  Hip flexion    Hip extension    Hip abduction    Hip adduction    Hip internal rotation    Hip external rotation    Knee flexion    Knee extension    Ankle dorsiflexion    Ankle plantarflexion    Ankle inversion    Ankle eversion     (Blank rows = not tested)  LOWER EXTREMITY STRENGTH:    MMT Right eval Left eval  Hip flexion    Hip extension    Hip abduction    Hip adduction    Hip internal rotation    Hip external rotation    Knee flexion    Knee extension    Ankle dorsiflexion    Ankle plantarflexion    Ankle inversion    Ankle eversion     (Blank rows = not tested)  02/12/2023 Berg 30/56   GAIT: Distance walked: In the clinic Assistive device utilized: Single point cane Level of assistance: Complete Independence Comments: Wajiha notes that she holds onto her husband's arm when she walks without her cane  TODAY'S TREATMENT:  DATE:  02/18/2023 Lumbar extension AROM 10 x 3 seconds Shoulder blade pinches 10 x 5 seconds  Neuromuscular  re-education: Tandem balance 8X 30 seconds, encourage step strategy, work from wide to narrow stance Heel-to-toe raises in doorway, no hands 20 x 3 seconds  Functional Activities:  Tap-ups on 6 and 8 inch step 20X  Step-up and over 4 inch step 20X Dynamic drills: long strides; retro stepping; side stepping Sit to stand 10 times slow eccentrics   02/12/2023 Lumbar extension AROM 10 x 3 seconds Shoulder blade pinches 10 x 5 seconds Sit to stand 5 times slow eccentrics Heel-to-toe raises in doorway, no hands 10 x 3 seconds  Neuromuscular re-education: Tandem balance 6X 30 seconds, encourage step strategy, work from wide to narrow [employer] 30/56   02/05/2023 Lumbar extension AROM 10 x 3 seconds Shoulder blade pinches 10 x 5 seconds Sit to stand 5 times slow eccentrics Heel-to-toe raises in doorway, no hands 10 x 3 seconds   PATIENT EDUCATION:  Education details: Briefly reviewed home exercise program results and encouraged Genean to show up on time for her appointment as she was 15 minutes late today Person educated: Patient Education method: Explanation, Demonstration, Tactile cues, Verbal cues, and Handouts Education comprehension: verbalized understanding, returned demonstration, verbal cues required, tactile cues required, and needs further education  HOME EXERCISE PROGRAM: Access Code: [email or web address] URL: [email or web address] Date: 02/12/2023 Prepared by: Vista Mink  Exercises - Standing Lumbar Extension at [place] 5 x daily - 7 x weekly - 1 sets - 5 reps - 3 seconds hold - Standing Scapular Retraction  - 5 x daily - 7 x weekly - 1 sets - 5 reps - 5 second hold - Heel Toe Raises with Counter Support  - 2 x daily - 7 x weekly - 1 sets - 20 reps - 3 seconds hold - Sit to Stand with Armchair  - 2 x daily - 7 x weekly - 1 sets - 5 reps - Tandem Stance  - 2 x daily - 7 x weekly - 1 sets - 5 reps - 20 second hold  ASSESSMENT:  CLINICAL  IMPRESSION: Roniqua reports better HEP compliance this week.  We spent a lot of time on Berg Balance Scale drills and reviewed her HEP.  Kacelynn is a high risk of future falls and is appropriate for a walker full-time.  Winnie prefers the cane and knows balance is a priority with her current PT.  Continue LE strength, balance, gait and back activities to meet long-term goals.  OBJECTIVE IMPAIRMENTS: Abnormal gait, decreased activity tolerance, decreased balance, decreased endurance, decreased knowledge of condition, difficulty walking, decreased ROM, decreased strength, decreased safety awareness, impaired perceived functional ability, improper body mechanics, postural dysfunction, and pain.   ACTIVITY LIMITATIONS: carrying, lifting, bending, sitting, standing, squatting, and locomotion level  PARTICIPATION LIMITATIONS: meal prep, cleaning, and community activity  PERSONAL FACTORS: Previous right foot surgery, lumbar decompression/laminectomy in 2022, left THA in 2021 are also affecting patient's functional outcome.   REHAB POTENTIAL: Good  CLINICAL DECISION MAKING: Stable/uncomplicated  EVALUATION COMPLEXITY: Low   GOALS: Goals reviewed with patient? Yes  SHORT TERM GOALS: Target date: 03/05/2023  Katrice will be independent with her day 1 home exercise program Baseline: Started 02/05/2023 Goal status: On Going 02/18/2023  2.  Establish a baseline Berg balance score Baseline: Deferred today secondary to Urmila being 15 minutes late for her appointment Goal status: Met 02/12/2023   LONG TERM GOALS: Target date: 04/02/2023  Improve FOTO to 63 in 11 visits Baseline: 51 Goal status: INITIAL  2.  Calixta will report low back pain consistently 0-3 out of 10 on the numeric pain rating scale Baseline: 0-5 out of 10 Goal status: On Going 02/18/2023  3.  Improve Berg balance scale scores to allow Deenna to be independent and safe with an appropriate assistive device at  discharge Baseline: Will be assessed visit 2 secondary to this patient arriving 15 minutes late at evaluation Goal status: INITIAL  4.  Raevin will be able to demonstrate appropriate sitting and standing postures to help avoid increases in low back pain Baseline: Education will be started visit 2 Goal status: On Going 02/18/2023  5.  Hollace will be independent with her long-term home exercise program at discharge Baseline: Started 02/05/2023 Goal status: On Going 02/18/2023   PLAN:  PT FREQUENCY: 2x/week  PT DURATION: 8 weeks  PLANNED INTERVENTIONS: Therapeutic exercises, Therapeutic activity, Neuromuscular re-education, Balance training, Gait training, Patient/Family education, Self Care, Stair training, Vestibular training, Canalith repositioning, Dry Needling, Spinal mobilization, Cryotherapy, and Manual therapy.  PLAN FOR NEXT SESSION: Berg and balance drills, consider assessing lower extremity flexibility.  Progress postural, lower extremity and low back strength to assist with meeting long-term goals.   Farley Ly, PT, MPT 02/18/2023, 5:19 PM

## 2023-02-20 ENCOUNTER — Ambulatory Visit: Payer: Medicare PPO | Admitting: Rehabilitative and Restorative Service Providers"

## 2023-02-20 ENCOUNTER — Encounter: Payer: Self-pay | Admitting: Rehabilitative and Restorative Service Providers"

## 2023-02-20 DIAGNOSIS — R293 Abnormal posture: Secondary | ICD-10-CM

## 2023-02-20 DIAGNOSIS — M5459 Other low back pain: Secondary | ICD-10-CM | POA: Diagnosis not present

## 2023-02-20 DIAGNOSIS — R262 Difficulty in walking, not elsewhere classified: Secondary | ICD-10-CM

## 2023-02-20 DIAGNOSIS — R2689 Other abnormalities of gait and mobility: Secondary | ICD-10-CM | POA: Diagnosis not present

## 2023-02-20 NOTE — Therapy (Signed)
OUTPATIENT PHYSICAL THERAPY THORACOLUMBAR TREATMENT  Patient Name: Brenda Ortiz MRN: [id number] DOB:1940/12/10, 82 y.o., female Today's Date: 02/20/2023  Referring diagnosis?  Diagnosis  M54.9 (ICD-10-CM) - Back pain   END OF SESSION:  PT End of Session - 02/20/23 1444     Visit Number 4    Number of Visits 16    Date for PT Re-Evaluation 04/02/23    Authorization Type HUMA[employer]    Authorization - Visit Number 4    Authorization - Number of Visits 12    Progress Note Due on Visit 12    PT Start Time 1101    PT Stop Time 1145    PT Time Calculation (min) 44 min    Equipment Utilized During Treatment Gait belt    Activity Tolerance Patient tolerated treatment well;No increased pain;Patient limited by fatigue    Behavior During Therapy [facility] for tasks assessed/performed             Past Medical History:  Diagnosis Date   Arthritis    Herniated lumbar intervertebral disc 2022   Hypothyroidism    Incontinence    Seasonal allergies    UTI (urinary tract infection) 06/2021   Past Surgical History:  Procedure Laterality Date   DILATION AND CURETTAGE OF UTERUS     x2   FOOT SURGERY Right    HYSTEROSCOPY WITH D & C N/A 08/14/2016   Procedure: DILATATION AND CURETTAGE /HYSTEROSCOPY;  Surgeon: Olivia Mackie, MD;  Location: [facility];  Service: Gynecology;  Laterality: N/A;   JOINT REPLACEMENT Left 02/03/2020   LUMBAR LAMINECTOMY/ DECOMPRESSION WITH MET-RX Right 08/20/2021   Procedure: Right Thoracic 12-Lumbar 1 Minimally invasive discectomy with metrx;  Surgeon: Jadene Pierini, MD;  Location: [facility] OR;  Service: Neurosurgery;  Laterality: Right;  3C/RM 19   TONSILLECTOMY     TOTAL HIP ARTHROPLASTY Left 02/03/2020   Procedure: LEFT TOTAL HIP ARTHROPLASTY ANTERIOR APPROACH;  Surgeon: Kathryne Hitch, MD;  Location: [facility];  Service: Orthopedics;  Laterality: Left;   Patient Active Problem List   Diagnosis Date Noted   Lumbar radiculopathy 08/20/2021   Status post  total replacement of left hip 02/03/2020   Unilateral primary osteoarthritis, left hip 12/01/2019   Low back pain 10/27/2019   Right hip pain 07/14/2017   Chest pain 06/11/2015   Hypothyroidism 06/11/2015    PCP: Chilton Greathouse, MD  REFERRING PROVIDER: Chilton Greathouse, MD   REFERRING DIAG:  Diagnosis  M54.9 (ICD-10-CM) - Back pain    Rationale for Evaluation and Treatment: Rehabilitation  THERAPY DIAG:  Abnormal posture  Difficulty in walking, not elsewhere classified  Other abnormalities of gait and mobility  Other low back pain  ONSET DATE: Chronic  SUBJECTIVE:  SUBJECTIVE STATEMENT: Laterria has several compression fractures (3) that are contributing to her back pain.  She reports low back pain has been noticeable the past 2-3 days and she would like more activities to address this.  PERTINENT HISTORY:  Previous right foot surgery, lumbar decompression/laminectomy in 2022, left THA in 2021  PAIN:  Are you having pain? Yes: NPRS scale: 0-5/10 this week on a 10/10 Pain location: Right > left low back pain Pain description: Achy, sore Aggravating factors: Standing, walking, weight-bearing Relieving factors: Sit, rest  PRECAUTIONS: Back and Fall  WEIGHT BEARING RESTRICTIONS: No  FALLS:  Has patient fallen in last 6 months? Yes. Number of falls 1, no injuries  LIVING ENVIRONMENT: Lives with: lives with their family and lives with their spouse Lives in: House/apartment Stairs:  Does OK in a 3 story house Has following equipment at home: Single point cane and Grab bars  OCCUPATION: Retired  PLOF: Independent with household mobility with device  PATIENT GOALS: Return to walking without the cane with improved standing and walking endurance  NEXT MD VISIT: January  2025  OBJECTIVE:   DIAGNOSTIC FINDINGS:  IMPRESSION: 1. T12-L1 severe right neural foraminal narrowing. 2. L5-S1 mild right neural foraminal narrowing.  PATIENT SURVEYS:  FOTO 51 (Goal 63) in 11 visits  SCREENING FOR RED FLAGS: Bowel or bladder incontinence: No Spinal tumors: No Cauda equina syndrome: No Compression fracture: Yes: 3  COGNITION: Overall cognitive status: Within functional limits for tasks assessed     SENSATION: No complaints of peripheral pains or paresthesias.  Notes some left LE edema.  MUSCLE LENGTH: Hamstrings:  Thomas test:   POSTURE: rounded shoulders, forward head, and decreased lumbar lordosis   LUMBAR ROM:   AROM 02/05/2023  Flexion   Extension 10  Right lateral flexion   Left lateral flexion   Right rotation   Left rotation    (Blank rows = not tested)  LOWER EXTREMITY ROM:     Active  Left/Right 02/05/2023 Left eval  Hip flexion    Hip extension    Hip abduction    Hip adduction    Hip internal rotation    Hip external rotation    Knee flexion    Knee extension    Ankle dorsiflexion    Ankle plantarflexion    Ankle inversion    Ankle eversion     (Blank rows = not tested)  LOWER EXTREMITY STRENGTH:    MMT Right eval Left eval  Hip flexion    Hip extension    Hip abduction    Hip adduction    Hip internal rotation    Hip external rotation    Knee flexion    Knee extension    Ankle dorsiflexion    Ankle plantarflexion    Ankle inversion    Ankle eversion     (Blank rows = not tested)  02/12/2023 Berg 30/56   GAIT: Distance walked: In the clinic Assistive device utilized: Single point cane Level of assistance: Complete Independence Comments: Lyndsay notes that she holds onto her husband's arm when she walks without her cane  TODAY'S TREATMENT:  DATE:  02/20/2023 Lumbar extension AROM  10 x 3 seconds Shoulder blade pinches 10 x 5 seconds Alternating hip hike 2 sets of 10 for 3 seconds Bridging 10X 5 seconds  Neuromuscular re-education: Tandem balance 6X 30 seconds, encourage step strategy, narrower stance Heel-to-toe raises in doorway, no hands 20 x 3 seconds  Functional Activities:  Tap-ups on 6 and 8 inch step 20X each Step-up and over 4 and 6 inch step 10X each Dynamic drills- long strides and retro stepping Slow marching 20X (try for) 3 seconds   02/18/2023 Lumbar extension AROM 10 x 3 seconds Shoulder blade pinches 10 x 5 seconds  Neuromuscular re-education: Tandem balance 8X 30 seconds, encourage step strategy, work from wide to narrow stance Heel-to-toe raises in doorway, no hands 20 x 3 seconds  Functional Activities:  Tap-ups on 6 and 8 inch step 20X  Step-up and over 4 inch step 20X Dynamic drills: long strides; retro stepping; side stepping Sit to stand 10 times slow eccentrics   02/12/2023 Lumbar extension AROM 10 x 3 seconds Shoulder blade pinches 10 x 5 seconds Sit to stand 5 times slow eccentrics Heel-to-toe raises in doorway, no hands 10 x 3 seconds  Neuromuscular re-education: Tandem balance 6X 30 seconds, encourage step strategy, work from wide to narrow stance [employer] Test 30/56   PATIENT EDUCATION:  Education details: Briefly reviewed home exercise program results and encouraged Claris CheMargaret to show up on time for her appointment as she was 15 minutes late today Person educated: Patient Education method: Explanation, Demonstration, Tactile cues, Verbal cues, and Handouts Education comprehension: verbalized understanding, returned demonstration, verbal cues required, tactile cues required, and needs further education  HOME EXERCISE PROGRAM: Access Code: [email or web address] URL: [email or web address] Date: 02/12/2023 Prepared by: Pauletta Brownsobert Calistro Rauf  Exercises - Standing Lumbar Extension at Wall - Forearms  - 5 x daily - 7 x weekly - 1 sets  - 5 reps - 3 seconds hold - Standing Scapular Retraction  - 5 x daily - 7 x weekly - 1 sets - 5 reps - 5 second hold - Heel Toe Raises with Counter Support  - 2 x daily - 7 x weekly - 1 sets - 20 reps - 3 seconds hold - Sit to Stand with Armchair  - 2 x daily - 7 x weekly - 1 sets - 5 reps - Tandem Stance  - 2 x daily - 7 x weekly - 1 sets - 5 reps - 20 second hold  ASSESSMENT:  CLINICAL IMPRESSION: Claris CheMargaret reports low back pain has been more frequent the past 2-3 days.  We added 2 exercises to address this in the clinic and with her HEP.  Balance remains a high priority as Claris CheMargaret is a high risk of future falls.  Continue balance and spine work to meet long-term goals.  OBJECTIVE IMPAIRMENTS: Abnormal gait, decreased activity tolerance, decreased balance, decreased endurance, decreased knowledge of condition, difficulty walking, decreased ROM, decreased strength, decreased safety awareness, impaired perceived functional ability, improper body mechanics, postural dysfunction, and pain.   ACTIVITY LIMITATIONS: carrying, lifting, bending, sitting, standing, squatting, and locomotion level  PARTICIPATION LIMITATIONS: meal prep, cleaning, and community activity  PERSONAL FACTORS: Previous right foot surgery, lumbar decompression/laminectomy in 2022, left THA in 2021 are also affecting patient's functional outcome.   REHAB POTENTIAL: Good  CLINICAL DECISION MAKING: Stable/uncomplicated  EVALUATION COMPLEXITY: Low   GOALS: Goals reviewed with patient? Yes  SHORT TERM GOALS: Target date: 03/05/2023  Claris CheMargaret will be independent with her day 1 home exercise program  Baseline: Started 02/05/2023 Goal status: On Going 02/18/2023  2.  Establish a baseline Berg balance score Baseline: Deferred today secondary to Claris CheMargaret being 15 minutes late for her appointment Goal status: Met 02/12/2023   LONG TERM GOALS: Target date: 04/02/2023  Improve FOTO to 63 in 11 visits Baseline: 51 Goal status:  INITIAL  2.  Claris CheMargaret will report low back pain consistently 0-3 out of 10 on the numeric pain rating scale Baseline: 0-5 out of 10 Goal status: On Going 02/18/2023  3.  Improve Berg balance scale scores to allow Claris CheMargaret to be independent and safe with an appropriate assistive device at discharge Baseline: Will be assessed visit 2 secondary to this patient arriving 15 minutes late at evaluation Goal status: INITIAL  4.  Claris CheMargaret will be able to demonstrate appropriate sitting and standing postures to help avoid increases in low back pain Baseline: Education will be started visit 2 Goal status: On Going 02/18/2023  5.  Claris CheMargaret will be independent with her long-term home exercise program at discharge Baseline: Started 02/05/2023 Goal status: On Going 02/18/2023   PLAN:  PT FREQUENCY: 2x/week  PT DURATION: 8 weeks  PLANNED INTERVENTIONS: Therapeutic exercises, Therapeutic activity, Neuromuscular re-education, Balance training, Gait training, Patient/Family education, Self Care, Stair training, Vestibular training, Canalith repositioning, Dry Needling, Spinal mobilization, Cryotherapy, and Manual therapy.  PLAN FOR NEXT SESSION: Berg and balance drills, consider assessing lower extremity flexibility.  Progress postural, lower extremity and low back strength to assist with meeting long-term goals.   Cherlyn Cushingob W Ovie Cornelio, PT, MPT 02/20/2023, 2:49 PM

## 2023-02-25 ENCOUNTER — Encounter: Payer: Self-pay | Admitting: Rehabilitative and Restorative Service Providers"

## 2023-02-25 ENCOUNTER — Ambulatory Visit: Payer: Medicare PPO | Admitting: Rehabilitative and Restorative Service Providers"

## 2023-02-25 DIAGNOSIS — R293 Abnormal posture: Secondary | ICD-10-CM | POA: Diagnosis not present

## 2023-02-25 DIAGNOSIS — M5459 Other low back pain: Secondary | ICD-10-CM | POA: Diagnosis not present

## 2023-02-25 DIAGNOSIS — R262 Difficulty in walking, not elsewhere classified: Secondary | ICD-10-CM

## 2023-02-25 DIAGNOSIS — R2689 Other abnormalities of gait and mobility: Secondary | ICD-10-CM | POA: Diagnosis not present

## 2023-02-25 NOTE — Therapy (Signed)
OUTPATIENT PHYSICAL THERAPY THORACOLUMBAR TREATMENT  Patient Name: Brenda Ortiz MRN: [id number] DOB:02/16/1941, 82 y.o., female Today's Date: 02/25/2023  Referring diagnosis?  Diagnosis  M54.9 (ICD-10-CM) - Back pain   END OF SESSION:  PT End of Session - 02/25/23 1603     Visit Number 5    Number of Visits 16    Date for PT Re-Evaluation 04/02/23    Authorization Type HU[employer]    Authorization - Visit Number 5    Authorization - Number of Visits 12    Progress Note Due on Visit 12    PT Start Time 1517    PT Stop Time 1600    PT Time Calculation (min) 43 min    Equipment Utilized During Treatment Gait belt    Activity Tolerance Patient tolerated treatment well;No increased pain    Behavior During Therapy [facility] for tasks assessed/performed             Past Medical History:  Diagnosis Date   Arthritis    Herniated lumbar intervertebral disc 2022   Hypothyroidism    Incontinence    Seasonal allergies    UTI (urinary tract infection) 06/2021   Past Surgical History:  Procedure Laterality Date   DILATION AND CURETTAGE OF UTERUS     x2   FOOT SURGERY Right    HYSTEROSCOPY WITH D & C N/A 08/14/2016   Procedure: DILATATION AND CURETTAGE /HYSTEROSCOPY;  Surgeon: Olivia Mackie, MD;  Location: [facility];  Service: Gynecology;  Laterality: N/A;   JOINT REPLACEMENT Left 02/03/2020   LUMBAR LAMINECTOMY/ DECOMPRESSION WITH MET-RX Right 08/20/2021   Procedure: Right Thoracic 12-Lumbar 1 Minimally invasive discectomy with metrx;  Surgeon: Jadene Pierini, MD;  Location: [facility] OR;  Service: Neurosurgery;  Laterality: Right;  3C/RM 19   TONSILLECTOMY     TOTAL HIP ARTHROPLASTY Left 02/03/2020   Procedure: LEFT TOTAL HIP ARTHROPLASTY ANTERIOR APPROACH;  Surgeon: Kathryne Hitch, MD;  Location: [facility];  Service: Orthopedics;  Laterality: Left;   Patient Active Problem List   Diagnosis Date Noted   Lumbar radiculopathy 08/20/2021   Status post total replacement of left  hip 02/03/2020   Unilateral primary osteoarthritis, left hip 12/01/2019   Low back pain 10/27/2019   Right hip pain 07/14/2017   Chest pain 06/11/2015   Hypothyroidism 06/11/2015    PCP: Chilton Greathouse, MD  REFERRING PROVIDER: Chilton Greathouse, MD   REFERRING DIAG:  Diagnosis  M54.9 (ICD-10-CM) - Back pain    Rationale for Evaluation and Treatment: Rehabilitation  THERAPY DIAG:  Abnormal posture  Difficulty in walking, not elsewhere classified  Other abnormalities of gait and mobility  Other low back pain  ONSET DATE: Chronic  SUBJECTIVE:  SUBJECTIVE STATEMENT: Brenda Ortiz has several compression fractures (3) that are contributing to her back pain.  Low back pain has been noticeable the past week.  She requested additional feedback with exercuses and activities to address back pain.  PERTINENT HISTORY:  Previous right foot surgery, lumbar decompression/laminectomy in 2022, left THA in 2021  PAIN:  Are you having pain? Yes: NPRS scale: 0-5/10 this week on a 10/10 Pain location: Right > left low back pain Pain description: Achy, sore Aggravating factors: Standing, walking, weight-bearing Relieving factors: Sit, rest  PRECAUTIONS: Back and Fall  WEIGHT BEARING RESTRICTIONS: No  FALLS:  Has patient fallen in last 6 months? Yes. Number of falls 1, no injuries  LIVING ENVIRONMENT: Lives with: lives with their family and lives with their spouse Lives in: House/apartment Stairs:  Does OK in a 3 story house Has following equipment at home: Single point cane and Grab bars  OCCUPATION: Retired  PLOF: Independent with household mobility with device  PATIENT GOALS: Return to walking without the cane with improved standing and walking endurance  NEXT MD VISIT: January 2025  OBJECTIVE:    DIAGNOSTIC FINDINGS:  IMPRESSION: 1. T12-L1 severe right neural foraminal narrowing. 2. L5-S1 mild right neural foraminal narrowing.  PATIENT SURVEYS:  FOTO 51 (Goal 63) in 11 visits  SCREENING FOR RED FLAGS: Bowel or bladder incontinence: No Spinal tumors: No Cauda equina syndrome: No Compression fracture: Yes: 3  COGNITION: Overall cognitive status: Within functional limits for tasks assessed     SENSATION: No complaints of peripheral pains or paresthesias.  Notes some left LE edema.  MUSCLE LENGTH: Hamstrings:  Thomas test:   POSTURE: rounded shoulders, forward head, and decreased lumbar lordosis   LUMBAR ROM:   AROM 02/05/2023  Flexion   Extension 10  Right lateral flexion   Left lateral flexion   Right rotation   Left rotation    (Blank rows = not tested)  LOWER EXTREMITY ROM:     Active  Left/Right 02/05/2023 Left eval  Hip flexion    Hip extension    Hip abduction    Hip adduction    Hip internal rotation    Hip external rotation    Knee flexion    Knee extension    Ankle dorsiflexion    Ankle plantarflexion    Ankle inversion    Ankle eversion     (Blank rows = not tested)  LOWER EXTREMITY STRENGTH:    MMT Right eval Left eval  Hip flexion    Hip extension    Hip abduction    Hip adduction    Hip internal rotation    Hip external rotation    Knee flexion    Knee extension    Ankle dorsiflexion    Ankle plantarflexion    Ankle inversion    Ankle eversion     (Blank rows = not tested)  02/12/2023 Berg 30/56   GAIT: Distance walked: In the clinic Assistive device utilized: Single point cane Level of assistance: Complete Independence Comments: [name] notes that she holds onto her husband's arm when she walks without her cane  TODAY'S TREATMENT:  DATE:  02/25/2023 Lumbar extension AROM 10 x 3  seconds Shoulder blade pinches 10 x 5 seconds Alternating hip hike 3 sets of 10 for 3 seconds (emphasis on avoiding sliding the foot forward) Bridging 10X 5 seconds  Neuromuscular re-education:  Heel-to-toe raises in doorway, no hands 20 x 3 seconds  Functional Activities:  Step-up and over 6 and 8 inch step 20X each Dynamic drills- long strides and retro stepping Slow marching 20X (try for) 3 seconds Sit to stand 10X slow eccentrics, no hands   02/20/2023 Lumbar extension AROM 10 x 3 seconds Shoulder blade pinches 10 x 5 seconds Alternating hip hike 2 sets of 10 for 3 seconds Bridging 10X 5 seconds  Neuromuscular re-education: Tandem balance 6X 30 seconds, encourage step strategy, narrower stance Heel-to-toe raises in doorway, no hands 20 x 3 seconds  Functional Activities:  Tap-ups on 6 and 8 inch step 20X each Step-up and over 4 and 6 inch step 10X each Dynamic drills- long strides and retro stepping Slow marching 20X (try for) 3 seconds   02/18/2023 Lumbar extension AROM 10 x 3 seconds Shoulder blade pinches 10 x 5 seconds  Neuromuscular re-education: Tandem balance 8X 30 seconds, encourage step strategy, work from wide to narrow stance Heel-to-toe raises in doorway, no hands 20 x 3 seconds  Functional Activities:  Tap-ups on 6 and 8 inch step 20X  Step-up and over 4 inch step 20X Dynamic drills: long strides; retro stepping; side stepping Sit to stand 10 times slow eccentrics   PATIENT EDUCATION:  Education details: Briefly reviewed home exercise program results and encouraged Virdia to show up on time for her appointment as she was 15 minutes late today Person educated: Patient Education method: Explanation, Demonstration, Tactile cues, Verbal cues, and Handouts Education comprehension: verbalized understanding, returned demonstration, verbal cues required, tactile cues required, and needs further education  HOME EXERCISE PROGRAM: Access Code: [email or web address] URL:  [email or web address] Date: 02/12/2023 Prepared by: Pauletta Browns  Exercises - Standing Lumbar Extension at Wall - Forearms  - 5 x daily - 7 x weekly - 1 sets - 5 reps - 3 seconds hold - Standing Scapular Retraction  - 5 x daily - 7 x weekly - 1 sets - 5 reps - 5 second hold - Heel Toe Raises with Counter Support  - 2 x daily - 7 x weekly - 1 sets - 20 reps - 3 seconds hold - Sit to Stand with Armchair  - 2 x daily - 7 x weekly - 1 sets - 5 reps - Tandem Stance  - 2 x daily - 7 x weekly - 1 sets - 5 reps - 20 second hold  ASSESSMENT:  CLINICAL IMPRESSION: Lanitra requested and received additional feedback regarding exercises to address her low back pain with her home exercise program.  Balance looked better today with steps and single leg stance, although Kerri-Lee is a high risk of future falls.  Continue balance and spine work to meet long-term goals.  OBJECTIVE IMPAIRMENTS: Abnormal gait, decreased activity tolerance, decreased balance, decreased endurance, decreased knowledge of condition, difficulty walking, decreased ROM, decreased strength, decreased safety awareness, impaired perceived functional ability, improper body mechanics, postural dysfunction, and pain.   ACTIVITY LIMITATIONS: carrying, lifting, bending, sitting, standing, squatting, and locomotion level  PARTICIPATION LIMITATIONS: meal prep, cleaning, and community activity  PERSONAL FACTORS: Previous right foot surgery, lumbar decompression/laminectomy in 2022, left THA in 2021 are also affecting patient's functional outcome.   REHAB POTENTIAL: Good  CLINICAL DECISION MAKING: Stable/uncomplicated  EVALUATION COMPLEXITY: Low   GOALS: Goals reviewed with patient? Yes  SHORT TERM GOALS: Target date: 03/05/2023  Claris CheMargaret will be independent with her day 1 home exercise program Baseline: Started 02/05/2023 Goal status: On Going 02/25/2023  2.  Establish a baseline Berg balance score Baseline: Deferred  today secondary to Claris CheMargaret being 15 minutes late for her appointment Goal status: Met 02/12/2023   LONG TERM GOALS: Target date: 04/02/2023  Improve FOTO to 63 in 11 visits Baseline: 51 Goal status: INITIAL  2.  Claris CheMargaret will report low back pain consistently 0-3 out of 10 on the numeric pain rating scale Baseline: 0-5 out of 10 Goal status: On Going 02/25/2023  3.  Improve Berg balance scale scores to allow Claris CheMargaret to be independent and safe with an appropriate assistive device at discharge Baseline: Will be assessed visit 2 secondary to this patient arriving 15 minutes late at evaluation Goal status: INITIAL  4.  Claris CheMargaret will be able to demonstrate appropriate sitting and standing postures to help avoid increases in low back pain Baseline: Education will be started visit 2 Goal status: On Going 02/25/2023  5.  Claris CheMargaret will be independent with her long-term home exercise program at discharge Baseline: Started 02/05/2023 Goal status: On Going 02/25/2023   PLAN:  PT FREQUENCY: 2x/week  PT DURATION: 8 weeks  PLANNED INTERVENTIONS: Therapeutic exercises, Therapeutic activity, Neuromuscular re-education, Balance training, Gait training, Patient/Family education, Self Care, Stair training, Vestibular training, Canalith repositioning, Dry Needling, Spinal mobilization, Cryotherapy, and Manual therapy.  PLAN FOR NEXT SESSION: Berg re-test and balance drills.  Progress postural, lower extremity and low back strength to assist with meeting long-term goals.   Cherlyn Cushingob W Angellynn Kimberlin, PT, MPT 02/25/2023, 4:09 PM

## 2023-02-27 ENCOUNTER — Ambulatory Visit: Payer: Medicare PPO | Admitting: Rehabilitative and Restorative Service Providers"

## 2023-02-27 ENCOUNTER — Encounter: Payer: Self-pay | Admitting: Rehabilitative and Restorative Service Providers"

## 2023-02-27 DIAGNOSIS — R262 Difficulty in walking, not elsewhere classified: Secondary | ICD-10-CM | POA: Diagnosis not present

## 2023-02-27 DIAGNOSIS — M5459 Other low back pain: Secondary | ICD-10-CM | POA: Diagnosis not present

## 2023-02-27 DIAGNOSIS — R2689 Other abnormalities of gait and mobility: Secondary | ICD-10-CM | POA: Diagnosis not present

## 2023-02-27 DIAGNOSIS — R293 Abnormal posture: Secondary | ICD-10-CM | POA: Diagnosis not present

## 2023-02-27 NOTE — Therapy (Signed)
OUTPATIENT PHYSICAL THERAPY THORACOLUMBAR TREATMENT  Patient Name: Brenda Ortiz MRN: [id number] DOB:July 16, 1941, 82 y.o., female Today's Date: 02/27/2023  Referring diagnosis?  Diagnosis  M54.9 (ICD-10-CM) - Back pain   END OF SESSION:  PT End of Session - 02/27/23 1053     Visit Number 6    Number of Visits 16    Date for PT Re-Evaluation 04/02/23    Authorization Type HU[employer]    Authorization - Visit Number 6    Authorization - Number of Visits 12    Progress Note Due on Visit 12    PT Start Time 1052    PT Stop Time 1141    PT Time Calculation (min) 49 min    Equipment Utilized During Treatment Gait belt    Activity Tolerance Patient tolerated treatment well;No increased pain    Behavior During Therapy [facility] for tasks assessed/performed              Past Medical History:  Diagnosis Date   Arthritis    Herniated lumbar intervertebral disc 2022   Hypothyroidism    Incontinence    Seasonal allergies    UTI (urinary tract infection) 06/2021   Past Surgical History:  Procedure Laterality Date   DILATION AND CURETTAGE OF UTERUS     x2   FOOT SURGERY Right    HYSTEROSCOPY WITH D & C N/A 08/14/2016   Procedure: DILATATION AND CURETTAGE /HYSTEROSCOPY;  Surgeon: Olivia Mackie, MD;  Location: [facility];  Service: Gynecology;  Laterality: N/A;   JOINT REPLACEMENT Left 02/03/2020   LUMBAR LAMINECTOMY/ DECOMPRESSION WITH MET-RX Right 08/20/2021   Procedure: Right Thoracic 12-Lumbar 1 Minimally invasive discectomy with metrx;  Surgeon: Jadene Pierini, MD;  Location: [facility];  Service: Neurosurgery;  Laterality: Right;  3C/RM 19   TONSILLECTOMY     TOTAL HIP ARTHROPLASTY Left 02/03/2020   Procedure: LEFT TOTAL HIP ARTHROPLASTY ANTERIOR APPROACH;  Surgeon: Kathryne Hitch, MD;  Location: [facility];  Service: Orthopedics;  Laterality: Left;   Patient Active Problem List   Diagnosis Date Noted   Lumbar radiculopathy 08/20/2021   Status post total replacement of  left hip 02/03/2020   Unilateral primary osteoarthritis, left hip 12/01/2019   Low back pain 10/27/2019   Right hip pain 07/14/2017   Chest pain 06/11/2015   Hypothyroidism 06/11/2015    PCP: Chilton Greathouse, MD  REFERRING PROVIDER: Chilton Greathouse, MD   REFERRING DIAG:  Diagnosis  M54.9 (ICD-10-CM) - Back pain    Rationale for Evaluation and Treatment: Rehabilitation  THERAPY DIAG:  Abnormal posture  Difficulty in walking, not elsewhere classified  Other abnormalities of gait and mobility  Other low back pain  ONSET DATE: Chronic  SUBJECTIVE:  SUBJECTIVE STATEMENT: Brenda Ortiz has several compression fractures (3) that contribute to her back pain.  Low back pain has been noticeable the past week.  We discussed following-up with her doctor about appropriate pain medication and possibly following-up on her GP's recommendation about at least speaking with a surgeon about kyphoplasty.  PERTINENT HISTORY:  Previous right foot surgery, lumbar decompression/laminectomy in 2022, left THA in 2021  PAIN:  Are you having pain? Yes: NPRS scale: 0-5/10 this week on a 10/10 Pain location: Right > left low back pain Pain description: Achy, sore Aggravating factors: Standing, walking, weight-bearing Relieving factors: Sit, rest  PRECAUTIONS: Back and Fall  WEIGHT BEARING RESTRICTIONS: No  FALLS:  Has patient fallen in last 6 months? Yes. Number of falls 1, no injuries  LIVING ENVIRONMENT: Lives with: lives with their family and lives with their spouse Lives in: House/apartment Stairs:  Does OK in a 3 story house Has following equipment at home: Single point cane and Grab bars  OCCUPATION: Retired  PLOF: Independent with household mobility with device  PATIENT GOALS: Return to walking  without the cane with improved standing and walking endurance  NEXT MD VISIT: January 2025  OBJECTIVE:   DIAGNOSTIC FINDINGS:  IMPRESSION: 1. T12-L1 severe right neural foraminal narrowing. 2. L5-S1 mild right neural foraminal narrowing.  PATIENT SURVEYS:  FOTO 51 (Goal 63) in 11 visits  SCREENING FOR RED FLAGS: Bowel or bladder incontinence: No Spinal tumors: No Cauda equina syndrome: No Compression fracture: Yes: 3  COGNITION: Overall cognitive status: Within functional limits for tasks assessed     SENSATION: No complaints of peripheral pains or paresthesias.  Notes some left LE edema.  MUSCLE LENGTH: Hamstrings:  Thomas test:   POSTURE: rounded shoulders, forward head, and decreased lumbar lordosis   LUMBAR ROM:   AROM 02/05/2023  Flexion   Extension 10  Right lateral flexion   Left lateral flexion   Right rotation   Left rotation    (Blank rows = not tested)  LOWER EXTREMITY ROM:     Active  Left/Right 02/05/2023 Left eval  Hip flexion    Hip extension    Hip abduction    Hip adduction    Hip internal rotation    Hip external rotation    Knee flexion    Knee extension    Ankle dorsiflexion    Ankle plantarflexion    Ankle inversion    Ankle eversion     (Blank rows = not tested)  LOWER EXTREMITY STRENGTH:    MMT Right eval Left eval  Hip flexion    Hip extension    Hip abduction    Hip adduction    Hip internal rotation    Hip external rotation    Knee flexion    Knee extension    Ankle dorsiflexion    Ankle plantarflexion    Ankle inversion    Ankle eversion     (Blank rows = not tested)  02/12/2023 Berg 30/56   GAIT: Distance walked: In the clinic Assistive device utilized: Single point cane Level of assistance: Complete Independence Comments: Brenda Ortiz notes that she holds onto her husband's arm when she walks without her cane  TODAY'S TREATMENT:  DATE:  02/27/2023 Lumbar extension AROM 10 x 3 seconds Shoulder blade pinches 10 x 5 seconds Alternating hip hike 2 sets of 10 for 3 seconds (emphasis on avoiding sliding the foot forward) Bridging 10X 5 seconds  Neuromuscular re-education:  Heel-to-toe raises in doorway, no hands 20 x 3 seconds Tandem balance narrower stance 4X 20 seconds  Functional Activities:  Step-up and over 6 inch step 20X Tap-up on 6 and 8 inch step 20X each Dynamic drills- long strides  Slow marching 20X (try for) 3 seconds Sit to stand 2 sets of 5 slow eccentrics, no hands   02/25/2023 Lumbar extension AROM 10 x 3 seconds Shoulder blade pinches 10 x 5 seconds Alternating hip hike 3 sets of 10 for 3 seconds (emphasis on avoiding sliding the foot forward) Bridging 10X 5 seconds  Neuromuscular re-education:  Heel-to-toe raises in doorway, no hands 20 x 3 seconds  Functional Activities:  Step-up and over 6 and 8 inch step 20X each Dynamic drills- long strides and retro stepping Slow marching 20X (try for) 3 seconds Sit to stand 10X slow eccentrics, no hands   02/20/2023 Lumbar extension AROM 10 x 3 seconds Shoulder blade pinches 10 x 5 seconds Alternating hip hike 2 sets of 10 for 3 seconds Bridging 10X 5 seconds  Neuromuscular re-education: Tandem balance 6X 30 seconds, encourage step strategy, narrower stance Heel-to-toe raises in doorway, no hands 20 x 3 seconds  Functional Activities:  Tap-ups on 6 and 8 inch step 20X each Step-up and over 4 and 6 inch step 10X each Dynamic drills- long strides and retro stepping Slow marching 20X (try for) 3 seconds   PATIENT EDUCATION:  Education details: Briefly reviewed home exercise program results and encouraged Brenda Ortiz to show up on time for her appointment as she was 15 minutes late today Person educated: Patient Education method: Explanation, Demonstration, Tactile cues, Verbal cues, and  Handouts Education comprehension: verbalized understanding, returned demonstration, verbal cues required, tactile cues required, and needs further education  HOME EXERCISE PROGRAM: Access Code: [email or web address] URL: [email or web address] Date: 02/12/2023 Prepared by: Pauletta Browns  Exercises - Standing Lumbar Extension at Wall - Forearms  - 5 x daily - 7 x weekly - 1 sets - 5 reps - 3 seconds hold - Standing Scapular Retraction  - 5 x daily - 7 x weekly - 1 sets - 5 reps - 5 second hold - Heel Toe Raises with Counter Support  - 2 x daily - 7 x weekly - 1 sets - 20 reps - 3 seconds hold - Sit to Stand with Armchair  - 2 x daily - 7 x weekly - 1 sets - 5 reps - Tandem Stance  - 2 x daily - 7 x weekly - 1 sets - 5 reps - 20 second hold  ASSESSMENT:  CLINICAL IMPRESSION: Brenda Ortiz still requires feedback for correct technique with her hip hike and postural exercises.  Balance is improving but Brenda Ortiz remains a high risk of future falls.  Continue balance and spine strength work to address fatigue related low back pain (3 compression fractures) and falls risk.  OBJECTIVE IMPAIRMENTS: Abnormal gait, decreased activity tolerance, decreased balance, decreased endurance, decreased knowledge of condition, difficulty walking, decreased ROM, decreased strength, decreased safety awareness, impaired perceived functional ability, improper body mechanics, postural dysfunction, and pain.   ACTIVITY LIMITATIONS: carrying, lifting, bending, sitting, standing, squatting, and locomotion level  PARTICIPATION LIMITATIONS: meal prep, cleaning, and community activity  PERSONAL FACTORS: Previous right foot surgery, lumbar decompression/laminectomy in 2022, left  THA in 2021 are also affecting patient's functional outcome.   REHAB POTENTIAL: Good  CLINICAL DECISION MAKING: Stable/uncomplicated  EVALUATION COMPLEXITY: Low   GOALS: Goals reviewed with patient? Yes  SHORT TERM GOALS: Target date:  03/05/2023  Brenda Ortiz will be independent with her day 1 home exercise program Baseline: Started 02/05/2023 Goal status: On Going 02/27/2023  2.  Establish a baseline Berg balance score Baseline: Deferred today secondary to Brenda Ortiz being 15 minutes late for her appointment Goal status: Met 02/12/2023   LONG TERM GOALS: Target date: 04/02/2023  Improve FOTO to 63 in 11 visits Baseline: 51 Goal status: INITIAL  2.  Brenda Ortiz will report low back pain consistently 0-3 out of 10 on the numeric pain rating scale Baseline: 0-5 out of 10 Goal status: On Going 02/27/2023  3.  Improve Berg balance scale scores to allow Brenda Ortiz to be independent and safe with an appropriate assistive device at discharge Baseline: Will be assessed visit 2 secondary to this patient arriving 15 minutes late at evaluation Goal status: INITIAL  4.  Brenda Ortiz will be able to demonstrate appropriate sitting and standing postures to help avoid increases in low back pain Baseline: Education will be started visit 2 Goal status: On Going 02/27/2023  5.  Brenda Ortiz will be independent with her long-term home exercise program at discharge Baseline: Started 02/05/2023 Goal status: On Going 02/27/2023   PLAN:  PT FREQUENCY: 2x/week  PT DURATION: 8 weeks  PLANNED INTERVENTIONS: Therapeutic exercises, Therapeutic activity, Neuromuscular re-education, Balance training, Gait training, Patient/Family education, Self Care, Stair training, Vestibular training, Canalith repositioning, Dry Needling, Spinal mobilization, Cryotherapy, and Manual therapy.  PLAN FOR NEXT SESSION: Gait and balance drills.  Progress postural, lower extremity and low back strength to assist with meeting long-term goals.  Follow-up on pain med and surgeon discussion from 02/27/2023.   Cherlyn Cushing, PT, MPT 02/27/2023, 11:48 AM

## 2023-03-04 ENCOUNTER — Encounter: Payer: Self-pay | Admitting: Rehabilitative and Restorative Service Providers"

## 2023-03-04 ENCOUNTER — Ambulatory Visit: Payer: Medicare PPO | Admitting: Rehabilitative and Restorative Service Providers"

## 2023-03-04 DIAGNOSIS — M5459 Other low back pain: Secondary | ICD-10-CM | POA: Diagnosis not present

## 2023-03-04 DIAGNOSIS — R269 Unspecified abnormalities of gait and mobility: Secondary | ICD-10-CM | POA: Diagnosis not present

## 2023-03-04 DIAGNOSIS — M542 Cervicalgia: Secondary | ICD-10-CM | POA: Diagnosis not present

## 2023-03-04 DIAGNOSIS — R2689 Other abnormalities of gait and mobility: Secondary | ICD-10-CM

## 2023-03-04 DIAGNOSIS — R030 Elevated blood-pressure reading, without diagnosis of hypertension: Secondary | ICD-10-CM | POA: Diagnosis not present

## 2023-03-04 DIAGNOSIS — E559 Vitamin D deficiency, unspecified: Secondary | ICD-10-CM | POA: Diagnosis not present

## 2023-03-04 DIAGNOSIS — M810 Age-related osteoporosis without current pathological fracture: Secondary | ICD-10-CM | POA: Diagnosis not present

## 2023-03-04 DIAGNOSIS — M48062 Spinal stenosis, lumbar region with neurogenic claudication: Secondary | ICD-10-CM | POA: Diagnosis not present

## 2023-03-04 DIAGNOSIS — S32010D Wedge compression fracture of first lumbar vertebra, subsequent encounter for fracture with routine healing: Secondary | ICD-10-CM | POA: Diagnosis not present

## 2023-03-04 DIAGNOSIS — R293 Abnormal posture: Secondary | ICD-10-CM | POA: Diagnosis not present

## 2023-03-04 DIAGNOSIS — N3281 Overactive bladder: Secondary | ICD-10-CM | POA: Diagnosis not present

## 2023-03-04 DIAGNOSIS — R262 Difficulty in walking, not elsewhere classified: Secondary | ICD-10-CM | POA: Diagnosis not present

## 2023-03-04 NOTE — Therapy (Signed)
OUTPATIENT PHYSICAL THERAPY THORACOLUMBAR TREATMENT  Patient Name: Brenda Ortiz MRN: [id number] DOB:09/14/1941, 82 y.o., female Today's Date: 03/04/2023  Referring diagnosis?  Diagnosis  M54.9 (ICD-10-CM) - Back pain   END OF SESSION:  PT End of Session - 03/04/23 1701     Visit Number 7    Number of Visits 16    Date for PT Re-Evaluation 04/02/23    Authorization Type HU[employer]    Authorization - Visit Number 7    Authorization - Number of Visits 12    Progress Note Due on Visit 12    PT Start Time 1522    PT Stop Time 1602    PT Time Calculation (min) 40 min    Equipment Utilized During Treatment Gait belt    Activity Tolerance Patient tolerated treatment well;No increased pain    Behavior During Therapy [facility] for tasks assessed/performed               Past Medical History:  Diagnosis Date   Arthritis    Herniated lumbar intervertebral disc 2022   Hypothyroidism    Incontinence    Seasonal allergies    UTI (urinary tract infection) 06/2021   Past Surgical History:  Procedure Laterality Date   DILATION AND CURETTAGE OF UTERUS     x2   FOOT SURGERY Right    HYSTEROSCOPY WITH D & C N/A 08/14/2016   Procedure: DILATATION AND CURETTAGE /HYSTEROSCOPY;  Surgeon: Olivia Mackie, MD;  Location: [facility];  Service: Gynecology;  Laterality: N/A;   JOINT REPLACEMENT Left 02/03/2020   LUMBAR LAMINECTOMY/ DECOMPRESSION WITH MET-RX Right 08/20/2021   Procedure: Right Thoracic 12-Lumbar 1 Minimally invasive discectomy with metrx;  Surgeon: Jadene Pierini, MD;  Location[facility];  Service: Neurosurgery;  Laterality: Right;  3C/RM 19   TONSILLECTOMY     TOTAL HIP ARTHROPLASTY Left 02/03/2020   Procedure: LEFT TOTAL HIP ARTHROPLASTY ANTERIOR APPROACH;  Surgeon: Kathryne Hitch, MD;  Location: [facility];  Service: Orthopedics;  Laterality: Left;   Patient Active Problem List   Diagnosis Date Noted   Lumbar radiculopathy 08/20/2021   Status post total replacement of  left hip 02/03/2020   Unilateral primary osteoarthritis, left hip 12/01/2019   Low back pain 10/27/2019   Right hip pain 07/14/2017   Chest pain 06/11/2015   Hypothyroidism 06/11/2015    PCP: Chilton Greathouse, MD  REFERRING PROVIDER: Chilton Greathouse, MD   REFERRING DIAG:  Diagnosis  M54.9 (ICD-10-CM) - Back pain    Rationale for Evaluation and Treatment: Rehabilitation  THERAPY DIAG:  Abnormal posture  Difficulty in walking, not elsewhere classified  Other abnormalities of gait and mobility  Other low back pain  ONSET DATE: Chronic  SUBJECTIVE:  SUBJECTIVE STATEMENT: Lonna has several compression fractures (3) that contribute to her back pain.  Low back pain has been noticeable the past week or so.  She was prescribed a new pain medication and we discussed adding more back specific activities today.  PERTINENT HISTORY:  Previous right foot surgery, lumbar decompression/laminectomy in 2022, left THA in 2021  PAIN:  Are you having pain? Yes: NPRS scale: 0-5/10 this week on a 10/10 Pain location: Right > left low back pain Pain description: Achy, sore Aggravating factors: Standing, walking, weight-bearing Relieving factors: Sit, rest  PRECAUTIONS: Back and Fall  WEIGHT BEARING RESTRICTIONS: No  FALLS:  Has patient fallen in last 6 months? Yes. Number of falls 1, no injuries  LIVING ENVIRONMENT: Lives with: lives with their family and lives with their spouse Lives in: House/apartment Stairs:  Does OK in a 3 story house Has following equipment at home: Single point cane and Grab bars  OCCUPATION: Retired  PLOF: Independent with household mobility with device  PATIENT GOALS: Return to walking without the cane with improved standing and walking endurance  NEXT MD VISIT:  January 2025  OBJECTIVE:   DIAGNOSTIC FINDINGS:  IMPRESSION: 1. T12-L1 severe right neural foraminal narrowing. 2. L5-S1 mild right neural foraminal narrowing.  PATIENT SURVEYS:  FOTO 51 (Goal 63) in 11 visits  SCREENING FOR RED FLAGS: Bowel or bladder incontinence: No Spinal tumors: No Cauda equina syndrome: No Compression fracture: Yes: 3  COGNITION: Overall cognitive status: Within functional limits for tasks assessed     SENSATION: No complaints of peripheral pains or paresthesias.  Notes some left LE edema.  MUSCLE LENGTH: Hamstrings:  Thomas test:   POSTURE: rounded shoulders, forward head, and decreased lumbar lordosis   LUMBAR ROM:   AROM 02/05/2023  Flexion   Extension 10  Right lateral flexion   Left lateral flexion   Right rotation   Left rotation    (Blank rows = not tested)  LOWER EXTREMITY ROM:     Active  Left/Right 02/05/2023 Left eval  Hip flexion    Hip extension    Hip abduction    Hip adduction    Hip internal rotation    Hip external rotation    Knee flexion    Knee extension    Ankle dorsiflexion    Ankle plantarflexion    Ankle inversion    Ankle eversion     (Blank rows = not tested)  LOWER EXTREMITY STRENGTH:    MMT Right eval Left eval  Hip flexion    Hip extension    Hip abduction    Hip adduction    Hip internal rotation    Hip external rotation    Knee flexion    Knee extension    Ankle dorsiflexion    Ankle plantarflexion    Ankle inversion    Ankle eversion     (Blank rows = not tested)  02/12/2023 Berg 30/56   GAIT: Distance walked: In the clinic Assistive device utilized: Single point cane Level of assistance: Complete Independence Comments: Madalee notes that she holds onto her husband's arm when she walks without her cane  TODAY'S TREATMENT:  DATE:  03/04/2023 Pull to  chest/Rows Blue theraband 20X 3 seconds Lumbar extension AROM 10 x 3 seconds Shoulder blade pinches 10 x 5 seconds Alternating hip hike 2 sets of 10 for 3 seconds (emphasis on avoiding sliding the foot forward) Bridging 2 sets of 10 for 5 seconds  Neuromuscular re-education:  Heel-to-toe raises in doorway, no hands 20 x 3 seconds  Functional Activities:  Tap-up on 8 inch step 20X each Dynamic drills- long strides  Slow marching 20X (try for) 3 seconds Sit to stand 2 sets of 5 slow eccentrics, no hands   02/27/2023 Lumbar extension AROM 10 x 3 seconds Shoulder blade pinches 10 x 5 seconds Alternating hip hike 2 sets of 10 for 3 seconds (emphasis on avoiding sliding the foot forward) Bridging 10X 5 seconds  Neuromuscular re-education:  Heel-to-toe raises in doorway, no hands 20 x 3 seconds Tandem balance narrower stance 4X 20 seconds  Functional Activities:  Step-up and over 6 inch step 20X Tap-up on 6 and 8 inch step 20X each Dynamic drills- long strides  Slow marching 20X (try for) 3 seconds Sit to stand 2 sets of 5 slow eccentrics, no hands   02/25/2023 Lumbar extension AROM 10 x 3 seconds Shoulder blade pinches 10 x 5 seconds Alternating hip hike 3 sets of 10 for 3 seconds (emphasis on avoiding sliding the foot forward) Bridging 10X 5 seconds  Neuromuscular re-education:  Heel-to-toe raises in doorway, no hands 20 x 3 seconds  Functional Activities:  Step-up and over 6 and 8 inch step 20X each Dynamic drills- long strides and retro stepping Slow marching 20X (try for) 3 seconds Sit to stand 10X slow eccentrics, no hands   PATIENT EDUCATION:  Education details: Briefly reviewed home exercise program results and encouraged Briahnna to show up on time for her appointment as she was 15 minutes late today Person educated: Patient Education method: Explanation, Demonstration, Tactile cues, Verbal cues, and Handouts Education comprehension: verbalized understanding,  returned demonstration, verbal cues required, tactile cues required, and needs further education  HOME EXERCISE PROGRAM: Access Code: [email or web address] URL: [email or web address] Date: 02/12/2023 Prepared by: Pauletta Browns  Exercises - Standing Lumbar Extension at Wall - Forearms  - 5 x daily - 7 x weekly - 1 sets - 5 reps - 3 seconds hold - Standing Scapular Retraction  - 5 x daily - 7 x weekly - 1 sets - 5 reps - 5 second hold - Heel Toe Raises with Counter Support  - 2 x daily - 7 x weekly - 1 sets - 20 reps - 3 seconds hold - Sit to Stand with Armchair  - 2 x daily - 7 x weekly - 1 sets - 5 reps - Tandem Stance  - 2 x daily - 7 x weekly - 1 sets - 5 reps - 20 second hold  ASSESSMENT:  CLINICAL IMPRESSION: Cyrilla wanted to focus more on her back today.  We added some resistance to her scapular pinches to help improve posture and decrease strain on her lower back.  We also encouraged her to participate fully with her back strengthening exercises including hip hiking and bridging along with her standing trunk extension and scapular retraction exercises at home.  Continue current plan of care to address Jamilah's back pain, balance impairments and her high risk of future falls.  OBJECTIVE IMPAIRMENTS: Abnormal gait, decreased activity tolerance, decreased balance, decreased endurance, decreased knowledge of condition, difficulty walking, decreased ROM, decreased strength, decreased safety awareness, impaired perceived functional ability, improper  body mechanics, postural dysfunction, and pain.   ACTIVITY LIMITATIONS: carrying, lifting, bending, sitting, standing, squatting, and locomotion level  PARTICIPATION LIMITATIONS: meal prep, cleaning, and community activity  PERSONAL FACTORS: Previous right foot surgery, lumbar decompression/laminectomy in 2022, left THA in 2021 are also affecting patient's functional outcome.   REHAB POTENTIAL: Good  CLINICAL DECISION MAKING:  Stable/uncomplicated  EVALUATION COMPLEXITY: Low   GOALS: Goals reviewed with patient? Yes  SHORT TERM GOALS: Target date: 03/05/2023  Shalan will be independent with her day 1 home exercise program Baseline: Started 02/05/2023 Goal status: On Going 03/04/2023  2.  Establish a baseline Berg balance score Baseline: Deferred today secondary to Miangel being 15 minutes late for her appointment Goal status: Met 02/12/2023   LONG TERM GOALS: Target date: 04/02/2023  Improve FOTO to 63 in 11 visits Baseline: 51 Goal status: INITIAL  2.  Keily will report low back pain consistently 0-3 out of 10 on the numeric pain rating scale Baseline: 0-5 out of 10 Goal status: On Going 03/04/2023  3.  Improve Berg balance scale scores to allow Daneille to be independent and safe with an appropriate assistive device at discharge Baseline: Will be assessed visit 2 secondary to this patient arriving 15 minutes late at evaluation Goal status: INITIAL  4.  Adalene will be able to demonstrate appropriate sitting and standing postures to help avoid increases in low back pain Baseline: Education will be started visit 2 Goal status: On Going 03/04/2023  5.  Tyffani will be independent with her long-term home exercise program at discharge Baseline: Started 02/05/2023 Goal status: On Going 03/04/2023   PLAN:  PT FREQUENCY: 2x/week  PT DURATION: 8 weeks  PLANNED INTERVENTIONS: Therapeutic exercises, Therapeutic activity, Neuromuscular re-education, Balance training, Gait training, Patient/Family education, Self Care, Stair training, Vestibular training, Canalith repositioning, Dry Needling, Spinal mobilization, Cryotherapy, and Manual therapy.  PLAN FOR NEXT SESSION: Gait and balance drills.  Progress postural, lower extremity and low back strength to assist with meeting long-term goals.  Follow-up on surgeon discussion from 02/27/2023.   Cherlyn Cushing, PT, MPT 03/04/2023, 5:07 PM

## 2023-03-06 ENCOUNTER — Ambulatory Visit: Payer: Medicare PPO | Admitting: Rehabilitative and Restorative Service Providers"

## 2023-03-06 ENCOUNTER — Encounter: Payer: Self-pay | Admitting: Rehabilitative and Restorative Service Providers"

## 2023-03-06 DIAGNOSIS — R2689 Other abnormalities of gait and mobility: Secondary | ICD-10-CM | POA: Diagnosis not present

## 2023-03-06 DIAGNOSIS — M5459 Other low back pain: Secondary | ICD-10-CM

## 2023-03-06 DIAGNOSIS — R262 Difficulty in walking, not elsewhere classified: Secondary | ICD-10-CM | POA: Diagnosis not present

## 2023-03-06 DIAGNOSIS — R293 Abnormal posture: Secondary | ICD-10-CM | POA: Diagnosis not present

## 2023-03-06 NOTE — Therapy (Signed)
OUTPATIENT PHYSICAL THERAPY THORACOLUMBAR TREATMENT  Patient Name: Brenda Ortiz MRN: [id number] DOB:1941/04/22, 82 y.o., female Today's Date: 03/06/2023  Referring diagnosis?  Diagnosis  M54.9 (ICD-10-CM) - Back pain   END OF SESSION:  PT End of Session - 03/06/23 1615     Visit Number 8    Number of Visits 16    Date for PT Re-Evaluation 04/02/23    Authorization Type [employer]    Authorization - Visit Number 8    Authorization - Number of Visits 12    Progress Note Due on Visit 12    PT Start Time 1101    PT Stop Time 1143    PT Time Calculation (min) 42 min    Equipment Utilized During Treatment Gait belt    Activity Tolerance Patient tolerated treatment well;No increased pain    Behavior During Therapy [facility] for tasks assessed/performed                Past Medical History:  Diagnosis Date   Arthritis    Herniated lumbar intervertebral disc 2022   Hypothyroidism    Incontinence    Seasonal allergies    UTI (urinary tract infection) 06/2021   Past Surgical History:  Procedure Laterality Date   DILATION AND CURETTAGE OF UTERUS     x2   FOOT SURGERY Right    HYSTEROSCOPY WITH D & C N/A 08/14/2016   Procedure: DILATATION AND CURETTAGE /HYSTEROSCOPY;  Surgeon: Olivia Mackie, MD;  Location: [facility];  Service: Gynecology;  Laterality: N/A;   JOINT REPLACEMENT Left 02/03/2020   LUMBAR LAMINECTOMY/ DECOMPRESSION WITH MET-RX Right 08/20/2021   Procedure: Right Thoracic 12-Lumbar 1 Minimally invasive discectomy with metrx;  Surgeon: Jadene Pierini, MD;  Location: [facility] OR;  Service: Neurosurgery;  Laterality: Right;  3C/RM 19   TONSILLECTOMY     TOTAL HIP ARTHROPLASTY Left 02/03/2020   Procedure: LEFT TOTAL HIP ARTHROPLASTY ANTERIOR APPROACH;  Surgeon: Kathryne Hitch, MD;  Location: [facility];  Service: Orthopedics;  Laterality: Left;   Patient Active Problem List   Diagnosis Date Noted   Lumbar radiculopathy 08/20/2021   Status post total replacement  of left hip 02/03/2020   Unilateral primary osteoarthritis, left hip 12/01/2019   Low back pain 10/27/2019   Right hip pain 07/14/2017   Chest pain 06/11/2015   Hypothyroidism 06/11/2015    PCP: Chilton Greathouse, MD  REFERRING PROVIDER: Chilton Greathouse, MD   REFERRING DIAG:  Diagnosis  M54.9 (ICD-10-CM) - Back pain    Rationale for Evaluation and Treatment: Rehabilitation  THERAPY DIAG:  Abnormal posture  Difficulty in walking, not elsewhere classified  Other abnormalities of gait and mobility  Other low back pain  ONSET DATE: Chronic  SUBJECTIVE:  SUBJECTIVE STATEMENT: Brenda Ortiz continues to be concerned about her back pain related to her compression fractures (3).  Her new pain medication comes with side affects and we discussed checking back with Dr. Felipa Eth on Monday if side effects continue through the weekend and that she may need to stop taking the new medication.  PERTINENT HISTORY:  Previous right foot surgery, lumbar decompression/laminectomy in 2022, left THA in 2021  PAIN:  Are you having pain? Yes: NPRS scale: 0-5/10 this week on a 10/10 Pain location: Right > left low back pain Pain description: Achy, sore Aggravating factors: Standing, walking, weight-bearing Relieving factors: Sit, rest  PRECAUTIONS: Back and Fall  WEIGHT BEARING RESTRICTIONS: No  FALLS:  Has patient fallen in last 6 months? Yes. Number of falls 1, no injuries  LIVING ENVIRONMENT: Lives with: lives with their family and lives with their spouse Lives in: House/apartment Stairs:  Does OK in a 3 story house Has following equipment at home: Single point cane and Grab bars  OCCUPATION: Retired  PLOF: Independent with household mobility with device  PATIENT GOALS: Return to walking without the cane  with improved standing and walking endurance  NEXT MD VISIT: January 2025  OBJECTIVE:   DIAGNOSTIC FINDINGS:  IMPRESSION: 1. T12-L1 severe right neural foraminal narrowing. 2. L5-S1 mild right neural foraminal narrowing.  PATIENT SURVEYS:  FOTO 51 (Goal 63) in 11 visits  SCREENING FOR RED FLAGS: Bowel or bladder incontinence: No Spinal tumors: No Cauda equina syndrome: No Compression fracture: Yes: 3  COGNITION: Overall cognitive status: Within functional limits for tasks assessed     SENSATION: No complaints of peripheral pains or paresthesias.  Notes some left LE edema.  MUSCLE LENGTH: Hamstrings:  Thomas test:   POSTURE: rounded shoulders, forward head, and decreased lumbar lordosis   LUMBAR ROM:   AROM 02/05/2023 03/06/2023  Flexion    Extension 10 10  Right lateral flexion    Left lateral flexion    Right rotation    Left rotation     (Blank rows = not tested)  LOWER EXTREMITY ROM:     Active  Left/Right 03/06/2023   Hip flexion 100/100   Hip extension    Hip abduction    Hip adduction    Hip internal rotation 3/3   Hip external rotation 29/29   Hamstrings  40/40   Knee extension    Ankle dorsiflexion    Ankle plantarflexion    Ankle inversion    Ankle eversion     (Blank rows = not tested)  LOWER EXTREMITY STRENGTH:    MMT Right eval Left eval  Hip flexion    Hip extension    Hip abduction    Hip adduction    Hip internal rotation    Hip external rotation    Knee flexion    Knee extension    Ankle dorsiflexion    Ankle plantarflexion    Ankle inversion    Ankle eversion     (Blank rows = not tested)  02/12/2023 Brenda Ortiz   [facility] PT Assessment - 03/06/23 0001       Balance   Balance Assessed Yes      Standardized Balance Assessment   Standardized Balance Assessment Berg Balance Test      Berg Balance Test   Sit to Stand Able to stand  independently using hands    Standing Unsupported Able to stand safely 2 minutes     Sitting with Back Unsupported but Feet Supported on Floor or Stool  Able to sit safely and securely 2 minutes    Stand to Sit Controls descent by using hands    Transfers Able to transfer safely, definite need of hands    Standing Unsupported with Eyes Closed Able to stand 10 seconds safely    Standing Unsupported with Feet Together Able to place feet together independently and stand 1 minute safely    From Standing, Reach Forward with Outstretched Arm Can reach forward >5 cm safely (2")    From Standing Position, Pick up Object from Floor Unable to pick up and needs supervision    From Standing Position, Turn to Look Behind Over each Shoulder Turn sideways only but maintains balance    Turn 360 Degrees Able to turn 360 degrees safely but slowly    Standing Unsupported, Alternately Place Feet on Step/Stool Able to complete >2 steps/needs minimal assist    Standing Unsupported, One Foot in Front Able to take small step independently and hold 30 seconds    Standing on One Leg Tries to lift leg/unable to hold 3 seconds but remains standing independently    Total Score 36            GAIT: Distance walked: In the clinic Assistive device utilized: Single point cane Level of assistance: Complete Independence Comments: Daianna notes that she holds onto her husband's arm when she walks without her cane  TODAY'S TREATMENT:                                                                                                                              DATE:  03/06/2023 Lumbar extension AROM 10 x 3 seconds Shoulder blade pinches 10 x 5 seconds Alternating hip hike 2 sets of 10 for 3 seconds (emphasis on avoiding sliding the foot forward) Bridging 10X 5 seconds Hamstrings stretch 4x 20 seconds (other leg straight, supine) Figure 4 stretch supine 4x 20 seconds  Functional Activities: Berg Re-test 36/56   03/04/2023 Pull to chest/Rows Blue theraband 20X 3 seconds Lumbar extension AROM 10 x 3  seconds Shoulder blade pinches 10 x 5 seconds Alternating hip hike 2 sets of 10 for 3 seconds (emphasis on avoiding sliding the foot forward) Bridging 2 sets of 10 for 5 seconds  Neuromuscular re-education:  Heel-to-toe raises in doorway, no hands 20 x 3 seconds  Functional Activities:  Tap-up on 8 inch step 20X each Dynamic drills- long strides  Slow marching 20X (try for) 3 seconds Sit to stand 2 sets of 5 slow eccentrics, no hands   02/27/2023 Lumbar extension AROM 10 x 3 seconds Shoulder blade pinches 10 x 5 seconds Alternating hip hike 2 sets of 10 for 3 seconds (emphasis on avoiding sliding the foot forward) Bridging 10X 5 seconds  Neuromuscular re-education:  Heel-to-toe raises in doorway, no hands 20 x 3 seconds Tandem balance narrower stance 4X 20 seconds  Functional Activities:  Step-up and over 6 inch step 20X Tap-up on 6 and 8 inch  step 20X each Dynamic drills- long strides  Slow marching 20X (try for) 3 seconds Sit to stand 2 sets of 5 slow eccentrics, no hands   PATIENT EDUCATION:  Education details: Briefly reviewed home exercise program results and encouraged Marvette to show up on time for her appointment as she was 15 minutes late today Person educated: Patient Education method: Explanation, Demonstration, Tactile cues, Verbal cues, and Handouts Education comprehension: verbalized understanding, returned demonstration, verbal cues required, tactile cues required, and needs further education  HOME EXERCISE PROGRAM: Access Code: [email or web address] URL: [email or web address] Date: 03/06/2023 Prepared by: Pauletta Browns  Exercises - Standing Lumbar Extension at Wall - Forearms  - 5 x daily - 7 x weekly - 1 sets - 5 reps - 3 seconds hold - Standing Scapular Retraction  - 5 x daily - 7 x weekly - 1 sets - 5 reps - 5 second hold - Heel Toe Raises with Counter Support  - 2 x daily - 7 x weekly - 1 sets - 20 reps - 3 seconds hold - Sit to Stand with  Armchair  - 2 x daily - 7 x weekly - 1 sets - 5 reps - Tandem Stance  - 2 x daily - 7 x weekly - 1 sets - 5 reps - 20 second hold - Yoga Bridge  - 2 x daily - 7 x weekly - 1 sets - 10 reps - 5 seconds hold - Standing Hip Hiking  - 3-5 x daily - 7 x weekly - 1 sets - 10 reps - 3 seconds hold - Standing Row with Anchored Resistance Band with PLB  - 1-2 x daily - 1 x weekly - 1 sets - 20 reps - 3 seconds hold - Supine Hamstring Stretch  - 2 x daily - 7 x weekly - 1 sets - 5 reps - 20 seconds hold - Supine Figure 4 Piriformis Stretch  - 2 x daily - 7 x weekly - 1 sets - 5 reps - 20 seconds hold  ASSESSMENT:  CLINICAL IMPRESSION: Levetta again wanted to focus more on her back.  Brief reassessment showed tight hamstrings and limited hip ER AROM along with postural and low back weakness.  All are being addressed with her current program.  Berg score improved to 36 (was 30), although Janesa is still a high risk of future falls and appropriate for a walker full-time.  We discussed following-up with Dr. Felipa Eth on Monday if nausea and other side effects from her new pain medication persist.  We will continue her balance, gait, fall-reduction program along with low back and postural strengthening to meet long-term goals.    OBJECTIVE IMPAIRMENTS: Abnormal gait, decreased activity tolerance, decreased balance, decreased endurance, decreased knowledge of condition, difficulty walking, decreased ROM, decreased strength, decreased safety awareness, impaired perceived functional ability, improper body mechanics, postural dysfunction, and pain.   ACTIVITY LIMITATIONS: carrying, lifting, bending, sitting, standing, squatting, and locomotion level  PARTICIPATION LIMITATIONS: meal prep, cleaning, and community activity  PERSONAL FACTORS: Previous right foot surgery, lumbar decompression/laminectomy in 2022, left THA in 2021 are also affecting patient's functional outcome.   REHAB POTENTIAL: Good  CLINICAL  DECISION MAKING: Stable/uncomplicated  EVALUATION COMPLEXITY: Low   GOALS: Goals reviewed with patient? Yes  SHORT TERM GOALS: Target date: 03/05/2023  Analis will be independent with her day 1 home exercise program Baseline: Started 02/05/2023 Goal status: Met 03/06/2023  2.  Establish a baseline Berg balance score Baseline: Deferred today secondary to Richard being 15 minutes late for  her appointment Goal status: Met 02/12/2023   LONG TERM GOALS: Target date: 04/02/2023  Improve FOTO to 63 in 11 visits Baseline: 51 Goal status: INITIAL  2.  Alitzel will report low back pain consistently 0-3 out of 10 on the numeric pain rating scale Baseline: 0-5 out of 10 Goal status: On Going 03/06/2023  3.  Improve Berg balance scale scores to allow Helen to be independent and safe with an appropriate assistive device at discharge Baseline: Will be assessed visit 2 secondary to this patient arriving 15 minutes late at evaluation Goal status: Partially Met 03/06/2023  4.  Syleena will be able to demonstrate appropriate sitting and standing postures to help avoid increases in low back pain Baseline: Education will be started visit 2 Goal status: Partially Met (inconsistent) 03/06/2023  5.  Xylina will be independent with her long-term home exercise program at discharge Baseline: Started 02/05/2023 Goal status: On Going 03/06/2023   PLAN:  PT FREQUENCY: 2x/week  PT DURATION: 8 weeks  PLANNED INTERVENTIONS: Therapeutic exercises, Therapeutic activity, Neuromuscular re-education, Balance training, Gait training, Patient/Family education, Self Care, Stair training, Vestibular training, Canalith repositioning, Dry Needling, Spinal mobilization, Cryotherapy, and Manual therapy.  PLAN FOR NEXT SESSION: Gait and balance drills.  Progress postural, lower extremity and low back strength to assist with meeting long-term goals.  Follow-up on surgeon discussion from 02/27/2023, how are side  effects with new pain meds?  Get a FOTO and consider progress/[employer] note.   Cherlyn Cushing, PT, MPT 03/06/2023, 4:32 PM

## 2023-03-11 DIAGNOSIS — Z85828 Personal history of other malignant neoplasm of skin: Secondary | ICD-10-CM | POA: Diagnosis not present

## 2023-03-11 DIAGNOSIS — C44311 Basal cell carcinoma of skin of nose: Secondary | ICD-10-CM | POA: Diagnosis not present

## 2023-03-13 ENCOUNTER — Ambulatory Visit: Payer: Medicare PPO | Admitting: Rehabilitative and Restorative Service Providers"

## 2023-03-13 ENCOUNTER — Encounter: Payer: Self-pay | Admitting: Rehabilitative and Restorative Service Providers"

## 2023-03-13 DIAGNOSIS — M5459 Other low back pain: Secondary | ICD-10-CM | POA: Diagnosis not present

## 2023-03-13 DIAGNOSIS — R293 Abnormal posture: Secondary | ICD-10-CM | POA: Diagnosis not present

## 2023-03-13 DIAGNOSIS — R262 Difficulty in walking, not elsewhere classified: Secondary | ICD-10-CM | POA: Diagnosis not present

## 2023-03-13 DIAGNOSIS — R2689 Other abnormalities of gait and mobility: Secondary | ICD-10-CM | POA: Diagnosis not present

## 2023-03-13 NOTE — Therapy (Signed)
OUTPATIENT PHYSICAL THERAPY THORACOLUMBAR TREATMENT/PROGRESS NOTE  Progress Note Reporting Period 02/05/2023 to 03/13/2023  See note below for Objective Data and Assessment of Progress/Goals.    Referring diagnosis?  Diagnosis  M54.9 (ICD-10-CM) - Back pain   Treatment diagnosis? (if different than referring diagnosis) R29.3   R26.2   R26.89   M54.59 What was this (referring dx) caused by? []  Surgery []  Fall [x]  Ongoing issue [x]  Arthritis [x]  Other: ______Compression fractures (3)______  Laterality: []  Rt []  Lt [x]  Both  Check all possible CPT codes:  *CHOOSE 10 OR LESS*    []  97110 (Therapeutic Exercise)  []  92507 (SLP Treatment)  []  97112 (Neuro Re-ed)   []  92526 (Swallowing Treatment)   []  97116 (Gait Training)   []  16109 (Cognitive Training, 1st 15 minutes) []  97140 (Manual Therapy)   []  97130 (Cognitive Training, each add'l 15 minutes)  []  97164 (Re-evaluation)                              []  Other, List CPT Code ____________  []  97530 (Therapeutic Activities)     []  97535 (Self Care)   [x]  All codes above (97110 - 97535)  []  97012 (Mechanical Traction)  []  97014 (E-stim Unattended)  []  97032 (E-stim manual)  []  97033 (Ionto)  []  97035 (Ultrasound) []  97750 (Physical Performance Training) []  [id number] (Aquatic Therapy) []  97016 (Vasopneumatic Device) []  97018 (Paraffin) []  97034 (Contrast Bath) []  97597 (Wound Care 1st 20 sq cm) []  97598 (Wound Care each add'l 20 sq cm) []  97760 (Orthotic Fabrication, Fitting, Training Initial) []  [id number] (Prosthetic Management and Training Initial) []  [id number] (Orthotic or Prosthetic Training/ Modification Subsequent)   Patient Name: KLAUDIA BEIRNE MRN: [id number] DOB:1941/01/21, 82 y.o., female Today's Date: 03/13/2023  Referring diagnosis?  Diagnosis  M54.9 (ICD-10-CM) - Back pain   END OF SESSION:  PT End of Session - 03/13/23 0804     Visit Number 9    Number of Visits 16    Date for PT Re-Evaluation 04/02/23     Authorization Type [employer]    Authorization - Visit Number 9    Authorization - Number of Visits 12    Progress Note Due on Visit 12    PT Start Time 0802    PT Stop Time 0845    PT Time Calculation (min) 43 min    Equipment Utilized During Treatment Gait belt    Activity Tolerance Patient tolerated treatment well;No increased pain    Behavior During Therapy [facility] for tasks assessed/performed            Progress Note Reporting Period 02/05/2023 to 03/13/2023  See note below for Objective Data and Assessment of Progress/Goals.    Past Medical History:  Diagnosis Date   Arthritis    Herniated lumbar intervertebral disc 2022   Hypothyroidism    Incontinence    Seasonal allergies    UTI (urinary tract infection) 06/2021   Past Surgical History:  Procedure Laterality Date   DILATION AND CURETTAGE OF UTERUS     x2   FOOT SURGERY Right    HYSTEROSCOPY WITH D & C N/A 08/14/2016   Procedure: DILATATION AND CURETTAGE /HYSTEROSCOPY;  Surgeon: Olivia Mackie, MD;  Location: [facility];  Service: Gynecology;  Laterality: N/A;   JOINT REPLACEMENT Left 02/03/2020   LUMBAR LAMINECTOMY/ DECOMPRESSION WITH MET-RX Right 08/20/2021   Procedure: Right Thoracic 12-Lumbar 1 Minimally invasive discectomy with metrx;  Surgeon: Jadene Pierini, MD;  Location: [facility];  Service: Neurosurgery;  Laterality: Right;  3C/RM 19   TONSILLECTOMY     TOTAL HIP ARTHROPLASTY Left 02/03/2020   Procedure: LEFT TOTAL HIP ARTHROPLASTY ANTERIOR APPROACH;  Surgeon: Kathryne Hitch, MD;  Location: [facility];  Service: Orthopedics;  Laterality: Left;   Patient Active Problem List   Diagnosis Date Noted   Lumbar radiculopathy 08/20/2021   Status post total replacement of left hip 02/03/2020   Unilateral primary osteoarthritis, left hip 12/01/2019   Low back pain 10/27/2019   Right hip pain 07/14/2017   Chest pain 06/11/2015   Hypothyroidism 06/11/2015    PCP: Chilton Greathouse, MD  REFERRING PROVIDER: Chilton Greathouse, MD   REFERRING DIAG:  Diagnosis  M54.9 (ICD-10-CM) - Back pain    Rationale for Evaluation and Treatment: Rehabilitation  THERAPY DIAG:  Abnormal posture  Difficulty in walking, not elsewhere classified  Other abnormalities of gait and mobility  Other low back pain  ONSET DATE: Chronic  SUBJECTIVE:                                                                                                                                                                                           SUBJECTIVE STATEMENT: Kellen is limited by back pain related to her compression fractures (3).  She decided to stop taking her new back pain medication due to side affects.  PERTINENT HISTORY:  Previous right foot surgery, lumbar decompression/laminectomy in 2022, left THA in 2021  PAIN:  Are you having pain? Yes: NPRS scale: 0-5/10 this week on a 10/10 Pain location: Right > left low back pain Pain description: Achy, sore Aggravating factors: Standing, walking, weight-bearing Relieving factors: Sit, rest  PRECAUTIONS: Back and Fall  WEIGHT BEARING RESTRICTIONS: No  FALLS:  Has patient fallen in last 6 months? Yes. Number of falls 1, no injuries  LIVING ENVIRONMENT: Lives with: lives with their family and lives with their spouse Lives in: House/apartment Stairs:  Does OK in a 3 story house Has following equipment at home: Single point cane and Grab bars  OCCUPATION: Retired  PLOF: Independent with household mobility with device  PATIENT GOALS: Return to walking without the cane with improved standing and walking endurance  NEXT MD VISIT: January 2025  OBJECTIVE:   DIAGNOSTIC FINDINGS:  IMPRESSION: 1. T12-L1 severe right neural foraminal narrowing. 2. L5-S1 mild right neural foraminal narrowing.  PATIENT SURVEYS:  Eval FOTO 51 (Goal 63) in 11 visits  03/13/2023 FOTO 52  SCREENING FOR RED FLAGS: Bowel or bladder incontinence: No Spinal tumors: No Cauda equina  syndrome: No Compression fracture: Yes: 3  COGNITION: Overall cognitive status: Within  functional limits for tasks assessed     SENSATION: No complaints of peripheral pains or paresthesias.  Notes some left LE edema.  MUSCLE LENGTH: Hamstrings:  Thomas test:   POSTURE: rounded shoulders, forward head, and decreased lumbar lordosis   LUMBAR ROM:   AROM 02/05/2023 03/06/2023  Flexion    Extension 10 10  Right lateral flexion    Left lateral flexion    Right rotation    Left rotation     (Blank rows = not tested)  LOWER EXTREMITY ROM:     Active  Left/Right 03/06/2023   Hip flexion 100/100   Hip extension    Hip abduction    Hip adduction    Hip internal rotation 3/3   Hip external rotation 29/29   Hamstrings  40/40   Knee extension    Ankle dorsiflexion    Ankle plantarflexion    Ankle inversion    Ankle eversion     (Blank rows = not tested)  LOWER EXTREMITY STRENGTH:    MMT Right eval Left eval  Hip flexion    Hip extension    Hip abduction    Hip adduction    Hip internal rotation    Hip external rotation    Knee flexion    Knee extension    Ankle dorsiflexion    Ankle plantarflexion    Ankle inversion    Ankle eversion     (Blank rows = not tested)  02/12/2023 Berg 30/56    GAIT: Distance walked: In the clinic Assistive device utilized: Single point cane Level of assistance: Complete Independence Comments: Taydem notes that she holds onto her husband's arm when she walks without her cane  TODAY'S TREATMENT:                                                                                                                              DATE:  03/13/2023 Lumbar extension AROM 10 x 3 seconds Shoulder blade pinches 10 x 5 seconds Alternating hip hike 2 sets of 10 for 3 seconds (emphasis on avoiding sliding the foot forward) Bridging 10X 5 seconds Hamstrings stretch 5x 20 seconds (other leg straight, supine) Figure 4 stretch supine 5x 20  seconds  Functional Activities:  Tap-up on 8 inch step 20X each Dynamic drills- long strides  Slow marching 20X (try for) 3 seconds Sit to stand 2 sets of 5 slow eccentrics, no hands   03/06/2023 Lumbar extension AROM 10 x 3 seconds Shoulder blade pinches 10 x 5 seconds Alternating hip hike 2 sets of 10 for 3 seconds (emphasis on avoiding sliding the foot forward) Bridging 10X 5 seconds Hamstrings stretch 4x 20 seconds (other leg straight, supine) Figure 4 stretch supine 4x 20 seconds  Functional Activities: Sharlene Motts Re-test 36/56   03/04/2023 Pull to chest/Rows Blue theraband 20X 3 seconds Lumbar extension AROM 10 x 3 seconds Shoulder blade pinches 10 x 5 seconds Alternating hip hike 2 sets of 10 for  3 seconds (emphasis on avoiding sliding the foot forward) Bridging 2 sets of 10 for 5 seconds  Neuromuscular re-education:  Heel-to-toe raises in doorway, no hands 20 x 3 seconds  Functional Activities:  Tap-up on 8 inch step 20X each Dynamic drills- long strides  Slow marching 20X (try for) 3 seconds Sit to stand 2 sets of 5 slow eccentrics, no hands  PATIENT EDUCATION:  Education details: Briefly reviewed home exercise program results and encouraged Kameisha to show up on time for her appointment as she was 15 minutes late today Person educated: Patient Education method: Explanation, Demonstration, Tactile cues, Verbal cues, and Handouts Education comprehension: verbalized understanding, returned demonstration, verbal cues required, tactile cues required, and needs further education  HOME EXERCISE PROGRAM: Access Code: [email or web address] URL: [email or web address] Date: 03/06/2023 Prepared by: Pauletta Browns  Exercises - Standing Lumbar Extension at Wall - Forearms  - 5 x daily - 7 x weekly - 1 sets - 5 reps - 3 seconds hold - Standing Scapular Retraction  - 5 x daily - 7 x weekly - 1 sets - 5 reps - 5 second hold - Heel Toe Raises with Counter Support  - 2 x daily - 7  x weekly - 1 sets - 20 reps - 3 seconds hold - Sit to Stand with Armchair  - 2 x daily - 7 x weekly - 1 sets - 5 reps - Tandem Stance  - 2 x daily - 7 x weekly - 1 sets - 5 reps - 20 second hold - Yoga Bridge  - 2 x daily - 7 x weekly - 1 sets - 10 reps - 5 seconds hold - Standing Hip Hiking  - 3-5 x daily - 7 x weekly - 1 sets - 10 reps - 3 seconds hold - Standing Row with Anchored Resistance Band with PLB  - 1-2 x daily - 1 x weekly - 1 sets - 20 reps - 3 seconds hold - Supine Hamstring Stretch  - 2 x daily - 7 x weekly - 1 sets - 5 reps - 20 seconds hold - Supine Figure 4 Piriformis Stretch  - 2 x daily - 7 x weekly - 1 sets - 5 reps - 20 seconds hold  ASSESSMENT:  CLINICAL IMPRESSION: Takita is making progress during her first few weeks of supervised physical therapy.  She has improved her Berg balance score, although she remains a high risk of future falls.  Her low back pain has been particularly limiting and she was unable to take pain medication prescribed by Dr. Felipa Eth due to nausea as a side effect.  I encouraged her to get back in touch with her doctor to see if there is something else she might be able to take to help with the pain.  The focus of Jerrilyn's spine rehabilitation is on postural strength and awareness to avoid flexion and increasing strain on her 3 compression fractures.  Balance is focused on improving reaction time, decreasing stride width and making her more stable to reduce her risk of future falls.  Cataleyah will certainly benefit from the recommended plan of care and another month of supervised physical therapy.  OBJECTIVE IMPAIRMENTS: Abnormal gait, decreased activity tolerance, decreased balance, decreased endurance, decreased knowledge of condition, difficulty walking, decreased ROM, decreased strength, decreased safety awareness, impaired perceived functional ability, improper body mechanics, postural dysfunction, and pain.   ACTIVITY LIMITATIONS: carrying,  lifting, bending, sitting, standing, squatting, and locomotion level  PARTICIPATION LIMITATIONS: meal prep, cleaning, and community activity  PERSONAL FACTORS: Previous right foot surgery, lumbar decompression/laminectomy in 2022, left THA in 2021 are also affecting patient's functional outcome.   REHAB POTENTIAL: Good  CLINICAL DECISION MAKING: Stable/uncomplicated  EVALUATION COMPLEXITY: Low   GOALS: Goals reviewed with patient? Yes  SHORT TERM GOALS: Target date: 03/05/2023  Loris will be independent with her day 1 home exercise program Baseline: Started 02/05/2023 Goal status: Met 03/06/2023  2.  Establish a baseline Berg balance score Baseline: Deferred today secondary to Danylah being 15 minutes late for her appointment Goal status: Met 02/12/2023   LONG TERM GOALS: Target date: 04/24/2023  Improve FOTO to 63 in 11 visits Baseline: 51 Goal status: On Going 03/13/2023  2.  Zyliah will report low back pain consistently 0-3 out of 10 on the numeric pain rating scale Baseline: 0-5 out of 10 Goal status: On Going 03/13/2023  3.  Improve Berg balance scale scores to allow Yuvia to be independent and safe with an appropriate assistive device at discharge Baseline: Will be assessed visit 2 secondary to this patient arriving 15 minutes late at evaluation Goal status: Partially Met 03/13/2023  4.  Zeppelin will be able to demonstrate appropriate sitting and standing postures to help avoid increases in low back pain Baseline: Education will be started visit 2 Goal status: Partially Met (inconsistent) 03/13/2023  5.  Nakkia will be independent with her long-term home exercise program at discharge Baseline: Started 02/05/2023 Goal status: On Going 03/13/2023   PLAN:  PT FREQUENCY: 2x/week  PT DURATION: 6 weeks  PLANNED INTERVENTIONS: Therapeutic exercises, Therapeutic activity, Neuromuscular re-education, Balance training, Gait training, Patient/Family education, Self  Care, Stair training, Vestibular training, Canalith repositioning, Dry Needling, Spinal mobilization, Cryotherapy, and Manual therapy.  PLAN FOR NEXT SESSION: Gait and balance drills.  Progress postural, lower extremity and low back strength to assist with meeting long-term goals.  Follow-up on surgeon discussion from 02/27/2023, how are side effects with new pain meds?    Cherlyn Cushing, PT, MPT 03/13/2023, 9:08 AM

## 2023-03-18 DIAGNOSIS — M8008XA Age-related osteoporosis with current pathological fracture, vertebra(e), initial encounter for fracture: Secondary | ICD-10-CM | POA: Diagnosis not present

## 2023-03-19 DIAGNOSIS — L821 Other seborrheic keratosis: Secondary | ICD-10-CM | POA: Diagnosis not present

## 2023-03-19 DIAGNOSIS — B078 Other viral warts: Secondary | ICD-10-CM | POA: Diagnosis not present

## 2023-03-19 DIAGNOSIS — D2272 Melanocytic nevi of left lower limb, including hip: Secondary | ICD-10-CM | POA: Diagnosis not present

## 2023-03-19 DIAGNOSIS — L820 Inflamed seborrheic keratosis: Secondary | ICD-10-CM | POA: Diagnosis not present

## 2023-03-19 DIAGNOSIS — Z85828 Personal history of other malignant neoplasm of skin: Secondary | ICD-10-CM | POA: Diagnosis not present

## 2023-03-19 DIAGNOSIS — L570 Actinic keratosis: Secondary | ICD-10-CM | POA: Diagnosis not present

## 2023-03-25 ENCOUNTER — Encounter: Payer: Medicare PPO | Admitting: Rehabilitative and Restorative Service Providers"

## 2023-04-02 ENCOUNTER — Encounter: Payer: Self-pay | Admitting: Rehabilitative and Restorative Service Providers"

## 2023-04-02 ENCOUNTER — Ambulatory Visit: Payer: Medicare PPO | Admitting: Rehabilitative and Restorative Service Providers"

## 2023-04-02 DIAGNOSIS — R2689 Other abnormalities of gait and mobility: Secondary | ICD-10-CM

## 2023-04-02 DIAGNOSIS — R262 Difficulty in walking, not elsewhere classified: Secondary | ICD-10-CM

## 2023-04-02 DIAGNOSIS — R293 Abnormal posture: Secondary | ICD-10-CM

## 2023-04-02 DIAGNOSIS — M8008XA Age-related osteoporosis with current pathological fracture, vertebra(e), initial encounter for fracture: Secondary | ICD-10-CM | POA: Diagnosis not present

## 2023-04-02 DIAGNOSIS — M5459 Other low back pain: Secondary | ICD-10-CM | POA: Diagnosis not present

## 2023-04-02 DIAGNOSIS — M5126 Other intervertebral disc displacement, lumbar region: Secondary | ICD-10-CM | POA: Diagnosis not present

## 2023-04-02 NOTE — Therapy (Signed)
OUTPATIENT PHYSICAL THERAPY THORACOLUMBAR TREATMENT/PROGRESS NOTE  Progress Note Reporting Period 02/05/2023 to 03/13/2023  See note below for Objective Data and Assessment of Progress/Goals.    Referring diagnosis?  Diagnosis  M54.9 (ICD-10-CM) - Back pain   Treatment diagnosis? (if different than referring diagnosis) R29.3   R26.2   R26.89   M54.59 What was this (referring dx) caused by? []  Surgery []  Fall [x]  Ongoing issue [x]  Arthritis [x]  Other: ______Compression fractures (3)______  Laterality: []  Rt []  Lt [x]  Both  Check all possible CPT codes:  *CHOOSE 10 OR LESS*    []  97110 (Therapeutic Exercise)  []  92507 (SLP Treatment)  []  97112 (Neuro Re-ed)   []  40981 (Swallowing Treatment)   []  97116 (Gait Training)   []  [id number] (Cognitive Training, 1st 15 minutes) []  97140 (Manual Therapy)   []  97130 (Cognitive Training, each add'l 15 minutes)  []  97164 (Re-evaluation)                              []  Other, List CPT Code ____________  []  97530 (Therapeutic Activities)     []  97535 (Self Care)   [x]  All codes above (97110 - 97535)  []  97012 (Mechanical Traction)  []  97014 (E-stim Unattended)  []  97032 (E-stim manual)  []  97033 (Ionto)  []  97035 (Ultrasound) []  97750 (Physical Performance Training) []  [id number] (Aquatic Therapy) []  97016 (Vasopneumatic Device) []  [id number] (Paraffin) []  97034 (Contrast Bath) []  97597 (Wound Care 1st 20 sq cm) []  97598 (Wound Care each add'l 20 sq cm) []  97760 (Orthotic Fabrication, Fitting, Training Initial) []  [id number] (Prosthetic Management and Training Initial) []  [id number] (Orthotic or Prosthetic Training/ Modification Subsequent)   Patient Name: REIGHLYNN DEFREECE MRN: [id number] DOB:27-Jan-1941, 82 y.o., female Today's Date: 04/02/2023  Referring diagnosis?  Diagnosis  M54.9 (ICD-10-CM) - Back pain   END OF SESSION:  PT End of Session - 04/02/23 1346     Visit Number 10    Number of Visits 16    Date for PT Re-Evaluation 05/01/23     Authorization Type [employer]    Authorization - Visit Number 10    Authorization - Number of Visits 12    Progress Note Due on Visit 21    PT Start Time 1306    PT Stop Time 1346    PT Time Calculation (min) 40 min    Equipment Utilized During Treatment Gait belt    Activity Tolerance Patient tolerated treatment well;No increased pain    Behavior During Therapy [facility] for tasks assessed/performed             Progress Note Reporting Period 02/05/2023 to 03/13/2023  See note below for Objective Data and Assessment of Progress/Goals.    Past Medical History:  Diagnosis Date   Arthritis    Herniated lumbar intervertebral disc 2022   Hypothyroidism    Incontinence    Seasonal allergies    UTI (urinary tract infection) 06/2021   Past Surgical History:  Procedure Laterality Date   DILATION AND CURETTAGE OF UTERUS     x2   FOOT SURGERY Right    HYSTEROSCOPY WITH D & C N/A 08/14/2016   Procedure: DILATATION AND CURETTAGE /HYSTEROSCOPY;  Surgeon: Olivia Mackie, MD;  Location: [facility];  Service: Gynecology;  Laterality: N/A;   JOINT REPLACEMENT Left 02/03/2020   LUMBAR LAMINECTOMY/ DECOMPRESSION WITH MET-RX Right 08/20/2021   Procedure: Right Thoracic 12-Lumbar 1 Minimally invasive discectomy with metrx;  Surgeon: Jadene Pierini,  MD;  Location: [facility];  Service: Neurosurgery;  Laterality: Right;  3C/RM 19   TONSILLECTOMY     TOTAL HIP ARTHROPLASTY Left 02/03/2020   Procedure: LEFT TOTAL HIP ARTHROPLASTY ANTERIOR APPROACH;  Surgeon: Kathryne Hitch, MD;  Location: [facility];  Service: Orthopedics;  Laterality: Left;   Patient Active Problem List   Diagnosis Date Noted   Lumbar radiculopathy 08/20/2021   Status post total replacement of left hip 02/03/2020   Unilateral primary osteoarthritis, left hip 12/01/2019   Low back pain 10/27/2019   Right hip pain 07/14/2017   Chest pain 06/11/2015   Hypothyroidism 06/11/2015    PCP: Chilton Greathouse, MD  REFERRING PROVIDER: Chilton Greathouse, MD   REFERRING DIAG:  Diagnosis  M54.9 (ICD-10-CM) - Back pain    Rationale for Evaluation and Treatment: Rehabilitation  THERAPY DIAG:  Abnormal posture - Plan: PT plan of care cert/re-cert  Difficulty in walking, not elsewhere classified - Plan: PT plan of care cert/re-cert  Other abnormalities of gait and mobility - Plan: PT plan of care cert/re-cert  Other low back pain - Plan: PT plan of care cert/re-cert  ONSET DATE: Chronic  SUBJECTIVE:                                                                                                                                                                                           SUBJECTIVE STATEMENT: Fallyn is limited by back pain related to her compression fractures (3).  She decided to stop taking her new back pain medication due to side affects.  PERTINENT HISTORY:  Previous right foot surgery, lumbar decompression/laminectomy in 2022, left THA in 2021  PAIN:  Are you having pain? Yes: NPRS scale: 0-5/10 this week on a 10/10 Pain location: Right > left low back pain Pain description: Achy, sore Aggravating factors: Standing, walking, weight-bearing Relieving factors: Sit, rest  PRECAUTIONS: Back and Fall  WEIGHT BEARING RESTRICTIONS: No  FALLS:  Has patient fallen in last 6 months? Yes. Number of falls 1, no injuries  LIVING ENVIRONMENT: Lives with: lives with their family and lives with their spouse Lives in: House/apartment Stairs:  Does OK in a 3 story house Has following equipment at home: Single point cane and Grab bars  OCCUPATION: Retired  PLOF: Independent with household mobility with device  PATIENT GOALS: Return to walking without the cane with improved standing and walking endurance  NEXT MD VISIT: January 2025  OBJECTIVE:   DIAGNOSTIC FINDINGS:  IMPRESSION: 1. T12-L1 severe right neural foraminal narrowing. 2. L5-S1 mild right neural foraminal narrowing.  PATIENT SURVEYS:  Eval  FOTO 51 (Goal 63) in 11 visits  03/13/2023 FOTO 52  SCREENING FOR RED FLAGS: Bowel or bladder incontinence: No Spinal tumors: No Cauda equina syndrome: No Compression fracture: Yes: 3  COGNITION: Overall cognitive status: Within functional limits for tasks assessed     SENSATION: No complaints of peripheral pains or paresthesias.  Notes some left LE edema.  MUSCLE LENGTH: Hamstrings:  Thomas test:   POSTURE: rounded shoulders, forward head, and decreased lumbar lordosis   LUMBAR ROM:   AROM 02/05/2023 03/06/2023  Flexion    Extension 10 10  Right lateral flexion    Left lateral flexion    Right rotation    Left rotation     (Blank rows = not tested)  LOWER EXTREMITY ROM:     Active  Left/Right 03/06/2023   Hip flexion 100/100   Hip extension    Hip abduction    Hip adduction    Hip internal rotation 3/3   Hip external rotation 29/29   Hamstrings  40/40   Knee extension    Ankle dorsiflexion    Ankle plantarflexion    Ankle inversion    Ankle eversion     (Blank rows = not tested)  LOWER EXTREMITY STRENGTH:    MMT Right eval Left eval  Hip flexion    Hip extension    Hip abduction    Hip adduction    Hip internal rotation    Hip external rotation    Knee flexion    Knee extension    Ankle dorsiflexion    Ankle plantarflexion    Ankle inversion    Ankle eversion     (Blank rows = not tested)  02/12/2023 Berg 30/56    GAIT: Distance walked: In the clinic Assistive device utilized: Single point cane Level of assistance: Complete Independence Comments: Cornetta notes that she holds onto her husband's arm when she walks without her cane  TODAY'S TREATMENT:                                                                                                                              DATE:  04/02/2023 Lumbar extension AROM 10 x 3 seconds Shoulder blade pinches 10 x 5 seconds Alternating hip hike 2 sets of 10 for 3 seconds (emphasis on avoiding  sliding the foot forward) Bridging 10X 5 seconds Hamstrings stretch 4x 20 seconds (other leg straight, supine) Figure 4 stretch supine 4x 20 seconds  Functional Activities:  Tap-up on 8 inch step 20X each and step-up and over 10X Dynamic drills- long strides with narrow stance Slow marching 20X (try for) 3 seconds Sit to stand 2 sets of 5 slow eccentrics, no hands Modified Berg Test 36/56   03/13/2023 Lumbar extension AROM 10 x 3 seconds Shoulder blade pinches 10 x 5 seconds Alternating hip hike 2 sets of 10 for 3 seconds (emphasis on avoiding sliding the foot forward) Bridging 10X 5 seconds Hamstrings stretch 5x 20 seconds (other leg straight, supine) Figure 4 stretch supine 5x  20 seconds  Functional Activities:  Tap-up on 8 inch step 20X each Dynamic drills- long strides  Slow marching 20X (try for) 3 seconds Sit to stand 2 sets of 5 slow eccentrics, no hands   03/06/2023 Lumbar extension AROM 10 x 3 seconds Shoulder blade pinches 10 x 5 seconds Alternating hip hike 2 sets of 10 for 3 seconds (emphasis on avoiding sliding the foot forward) Bridging 10X 5 seconds Hamstrings stretch 4x 20 seconds (other leg straight, supine) Figure 4 stretch supine 4x 20 seconds  Functional Activities: Sharlene Motts Re-test 36/56   PATIENT EDUCATION:  Education details: Briefly reviewed home exercise program results and encouraged Jaidan to show up on time for her appointment as she was 15 minutes late today Person educated: Patient Education method: Explanation, Demonstration, Tactile cues, Verbal cues, and Handouts Education comprehension: verbalized understanding, returned demonstration, verbal cues required, tactile cues required, and needs further education  HOME EXERCISE PROGRAM: Access Code: [email or web address] URL: [email or web address] Date: 03/06/2023 Prepared by: Pauletta Browns  Exercises - Standing Lumbar Extension at Wall - Forearms  - 5 x daily - 7 x weekly - 1 sets - 5  reps - 3 seconds hold - Standing Scapular Retraction  - 5 x daily - 7 x weekly - 1 sets - 5 reps - 5 second hold - Heel Toe Raises with Counter Support  - 2 x daily - 7 x weekly - 1 sets - 20 reps - 3 seconds hold - Sit to Stand with Armchair  - 2 x daily - 7 x weekly - 1 sets - 5 reps - Tandem Stance  - 2 x daily - 7 x weekly - 1 sets - 5 reps - 20 second hold - Yoga Bridge  - 2 x daily - 7 x weekly - 1 sets - 10 reps - 5 seconds hold - Standing Hip Hiking  - 3-5 x daily - 7 x weekly - 1 sets - 10 reps - 3 seconds hold - Standing Row with Anchored Resistance Band with PLB  - 1-2 x daily - 1 x weekly - 1 sets - 20 reps - 3 seconds hold - Supine Hamstring Stretch  - 2 x daily - 7 x weekly - 1 sets - 5 reps - 20 seconds hold - Supine Figure 4 Piriformis Stretch  - 2 x daily - 7 x weekly - 1 sets - 5 reps - 20 seconds hold  ASSESSMENT:  CLINICAL IMPRESSION: Arizbeth has not attended a supervised PT visit in ~ 3 weeks due to travel.  Objective progress is noted with her initial visits of supervised physical therapy.  She has improved her Berg balance score, although she remains a high risk of future falls.  Her low back pain has been limiting and she had an MRI this morning to get additional information about her spine.  The focus of Keiana's spine rehabilitation iremains postural strength and awareness to avoid flexion and increasing strain on her 3 compression fractures.  Balance is focused on improving reaction time, decreasing stride width and making her more stable to reduce her risk of future falls.  Philena will continue to benefit from the recommended plan of care and another month of supervised physical therapy.  OBJECTIVE IMPAIRMENTS: Abnormal gait, decreased activity tolerance, decreased balance, decreased endurance, decreased knowledge of condition, difficulty walking, decreased ROM, decreased strength, decreased safety awareness, impaired perceived functional ability, improper body  mechanics, postural dysfunction, and pain.   ACTIVITY LIMITATIONS: carrying, lifting, bending, sitting, standing, squatting, and locomotion  level  PARTICIPATION LIMITATIONS: meal prep, cleaning, and community activity  PERSONAL FACTORS: Previous right foot surgery, lumbar decompression/laminectomy in 2022, left THA in 2021 are also affecting patient's functional outcome.   REHAB POTENTIAL: Good  CLINICAL DECISION MAKING: Stable/uncomplicated  EVALUATION COMPLEXITY: Low   GOALS: Goals reviewed with patient? Yes  SHORT TERM GOALS: Target date: 03/05/2023  Calah will be independent with her day 1 home exercise program Baseline: Started 02/05/2023 Goal status: Met 03/06/2023  2.  Establish a baseline Berg balance score Baseline: Deferred today secondary to Letesha being 15 minutes late for her appointment Goal status: Met 02/12/2023   LONG TERM GOALS: Target date: 05/01/2023  Improve FOTO to 63 in 11 visits Baseline: 51 Goal status: On Going 03/13/2023  2.  Priscilla will report low back pain consistently 0-3 out of 10 on the numeric pain rating scale Baseline: 0-5 out of 10 Goal status: On Going 04/02/2023  3.  Improve Berg balance scale scores to allow Nadalyn to be independent and safe with an appropriate assistive device at discharge Baseline: Will be assessed visit 2 secondary to this patient arriving 15 minutes late at evaluation Goal status: Partially Met 04/02/2023  4.  Kaylanni will be able to demonstrate appropriate sitting and standing postures to help avoid increases in low back pain Baseline: Education will be started visit 2 Goal status: Partially Met (inconsistent) 04/02/2023  5.  Susannah will be independent with her long-term home exercise program at discharge Baseline: Started 02/05/2023 Goal status: On Going 04/02/2023   PLAN:  PT FREQUENCY: 2x/week  PT DURATION: 6 weeks  PLANNED INTERVENTIONS: Therapeutic exercises, Therapeutic activity,  Neuromuscular re-education, Balance training, Gait training, Patient/Family education, Self Care, Stair training, Vestibular training, Canalith repositioning, Dry Needling, Spinal mobilization, Cryotherapy, and Manual therapy.  PLAN FOR NEXT SESSION: Gait and balance drills.  Progress postural, lower extremity and low back strength to assist with meeting long-term goals.  Follow-up on MRI results from 04/02/2023 imaging.  Cherlyn Cushing, PT, MPT 04/02/2023, 3:37 PM

## 2023-04-08 ENCOUNTER — Encounter: Payer: Medicare PPO | Admitting: Rehabilitative and Restorative Service Providers"

## 2023-04-08 DIAGNOSIS — Z6825 Body mass index (BMI) 25.0-25.9, adult: Secondary | ICD-10-CM | POA: Diagnosis not present

## 2023-04-08 DIAGNOSIS — M8008XA Age-related osteoporosis with current pathological fracture, vertebra(e), initial encounter for fracture: Secondary | ICD-10-CM | POA: Diagnosis not present

## 2023-04-10 ENCOUNTER — Encounter: Payer: Medicare PPO | Admitting: Rehabilitative and Restorative Service Providers"

## 2023-04-23 ENCOUNTER — Ambulatory Visit: Payer: Medicare PPO | Admitting: Rehabilitative and Restorative Service Providers"

## 2023-04-23 ENCOUNTER — Encounter: Payer: Self-pay | Admitting: Rehabilitative and Restorative Service Providers"

## 2023-04-23 DIAGNOSIS — R2689 Other abnormalities of gait and mobility: Secondary | ICD-10-CM

## 2023-04-23 DIAGNOSIS — R262 Difficulty in walking, not elsewhere classified: Secondary | ICD-10-CM | POA: Diagnosis not present

## 2023-04-23 DIAGNOSIS — R293 Abnormal posture: Secondary | ICD-10-CM

## 2023-04-23 DIAGNOSIS — M5459 Other low back pain: Secondary | ICD-10-CM | POA: Diagnosis not present

## 2023-04-23 NOTE — Therapy (Signed)
OUTPATIENT PHYSICAL THERAPY THORACOLUMBAR TREATMENT NOTE    Referring diagnosis?  Diagnosis  M54.9 (ICD-10-CM) - Back pain   Treatment diagnosis? (if different than referring diagnosis) R29.3   R26.2   R26.89   M54.59 What was this (referring dx) caused by? []  Surgery []  Fall [x]  Ongoing issue [x]  Arthritis [x]  Other: ______Compression fractures (3)______  Laterality: []  Rt []  Lt [x]  Both  Check all possible CPT codes:  *CHOOSE 10 OR LESS*    []  97110 (Therapeutic Exercise)  []  92507 (SLP Treatment)  []  97112 (Neuro Re-ed)   []  92526 (Swallowing Treatment)   []  97116 (Gait Training)   []  [id number] (Cognitive Training, 1st 15 minutes) []  97140 (Manual Therapy)   []  16109 (Cognitive Training, each add'l 15 minutes)  []  97164 (Re-evaluation)                              []  Other, List CPT Code ____________  []  97530 (Therapeutic Activities)     []  97535 (Self Care)   [x]  All codes above (97110 - 97535)  []  97012 (Mechanical Traction)  []  97014 (E-stim Unattended)  []  97032 (E-stim manual)  []  97033 (Ionto)  []  97035 (Ultrasound) []  97750 (Physical Performance Training) []  [id number] (Aquatic Therapy) []  97016 (Vasopneumatic Device) []  [id number] (Paraffin) []  97034 (Contrast Bath) []  97597 (Wound Care 1st 20 sq cm) []  97598 (Wound Care each add'l 20 sq cm) []  97760 (Orthotic Fabrication, Fitting, Training Initial) []  [id number] (Prosthetic Management and Training Initial) []  [id number] (Orthotic or Prosthetic Training/ Modification Subsequent)   Patient Name: Brenda Ortiz MRN: [id number] DOB:11-Dec-1940, 82 y.o., female Today's Date: 04/23/2023  Referring diagnosis?  Diagnosis  M54.9 (ICD-10-CM) - Back pain   END OF SESSION:  PT End of Session - 04/23/23 1437     Visit Number 11    Number of Visits 16    Date for PT Re-Evaluation 05/01/23    Authorization Type [employer]    Authorization - Visit Number 11    Authorization - Number of Visits 12    Progress Note Due on Visit 21     PT Start Time 1434    PT Stop Time 1520    PT Time Calculation (min) 46 min    Equipment Utilized During Treatment Gait belt    Activity Tolerance Patient tolerated treatment well;No increased pain;Patient limited by fatigue    Behavior During Therapy [facility] for tasks assessed/performed             Past Medical History:  Diagnosis Date   Arthritis    Herniated lumbar intervertebral disc 2022   Hypothyroidism    Incontinence    Seasonal allergies    UTI (urinary tract infection) 06/2021   Past Surgical History:  Procedure Laterality Date   DILATION AND CURETTAGE OF UTERUS     x2   FOOT SURGERY Right    HYSTEROSCOPY WITH D & C N/A 08/14/2016   Procedure: DILATATION AND CURETTAGE /HYSTEROSCOPY;  Surgeon: Olivia Mackie, MD;  Location: [facility];  Service: Gynecology;  Laterality: N/A;   JOINT REPLACEMENT Left 02/03/2020   LUMBAR LAMINECTOMY/ DECOMPRESSION WITH MET-RX Right 08/20/2021   Procedure: Right Thoracic 12-Lumbar 1 Minimally invasive discectomy with metrx;  Surgeon: Jadene Pierini, MD;  Location: [facility];  Service: Neurosurgery;  Laterality: Right;  3C/RM 19   TONSILLECTOMY     TOTAL HIP ARTHROPLASTY Left 02/03/2020   Procedure: LEFT TOTAL HIP ARTHROPLASTY ANTERIOR APPROACH;  Surgeon:  Kathryne Hitch, MD;  Location: [facility];  Service: Orthopedics;  Laterality: Left;   Patient Active Problem List   Diagnosis Date Noted   Lumbar radiculopathy 08/20/2021   Status post total replacement of left hip 02/03/2020   Unilateral primary osteoarthritis, left hip 12/01/2019   Low back pain 10/27/2019   Right hip pain 07/14/2017   Chest pain 06/11/2015   Hypothyroidism 06/11/2015    PCP: Chilton Greathouse, MD  REFERRING PROVIDER: Chilton Greathouse, MD   REFERRING DIAG:  Diagnosis  M54.9 (ICD-10-CM) - Back pain    Rationale for Evaluation and Treatment: Rehabilitation  THERAPY DIAG:  Abnormal posture  Difficulty in walking, not elsewhere classified  Other  abnormalities of gait and mobility  Other low back pain  ONSET DATE: Chronic  SUBJECTIVE:                                                                                                                                                                                           SUBJECTIVE STATEMENT: Brenda Ortiz is limited by back pain related to her compression fractures (3).  She reports inconsistent home exercise program compliance due to travel.  She also reported she is trying to get an appointment to get a kyphoplasty as her most recent imaging showed a slight worsening of her compression fractures.  PERTINENT HISTORY:  Previous right foot surgery, lumbar decompression/laminectomy in 2022, left THA in 2021  PAIN:  Are you having pain? Yes: NPRS scale: 0-6/10 this week on a 10/10 Pain location: Right > left low back pain Pain description: Achy, sore Aggravating factors: Standing, walking, weight-bearing Relieving factors: Sit, rest  PRECAUTIONS: Back and Fall  WEIGHT BEARING RESTRICTIONS: No  FALLS:  Has patient fallen in last 6 months? Yes. Number of falls 1, no injuries  LIVING ENVIRONMENT: Lives with: lives with their family and lives with their spouse Lives in: House/apartment Stairs:  Does OK in a 3 story house Has following equipment at home: Single point cane and Grab bars  OCCUPATION: Retired  PLOF: Independent with household mobility with device  PATIENT GOALS: Return to walking without the cane with improved standing and walking endurance  NEXT MD VISIT: January 2025  OBJECTIVE:   DIAGNOSTIC FINDINGS:  IMPRESSION: 1. T12-L1 severe right neural foraminal narrowing. 2. L5-S1 mild right neural foraminal narrowing.  PATIENT SURVEYS:  Eval FOTO 51 (Goal 63) in 11 visits  03/13/2023 FOTO 52  SCREENING FOR RED FLAGS: Bowel or bladder incontinence: No Spinal tumors: No Cauda equina syndrome: No Compression fracture: Yes: 3  COGNITION: Overall cognitive  status: Within functional limits for tasks assessed     SENSATION: No  complaints of peripheral pains or paresthesias.  Notes some left LE edema.  MUSCLE LENGTH: Hamstrings:  Thomas test:   POSTURE: rounded shoulders, forward head, and decreased lumbar lordosis   LUMBAR ROM:   AROM 02/05/2023 03/06/2023  Flexion    Extension 10 10  Right lateral flexion    Left lateral flexion    Right rotation    Left rotation     (Blank rows = not tested)  LOWER EXTREMITY ROM:     Active  Left/Right 03/06/2023   Hip flexion 100/100   Hip extension    Hip abduction    Hip adduction    Hip internal rotation 3/3   Hip external rotation 29/29   Hamstrings  40/40   Knee extension    Ankle dorsiflexion    Ankle plantarflexion    Ankle inversion    Ankle eversion     (Blank rows = not tested)  LOWER EXTREMITY STRENGTH:    MMT Right eval Left eval  Hip flexion    Hip extension    Hip abduction    Hip adduction    Hip internal rotation    Hip external rotation    Knee flexion    Knee extension    Ankle dorsiflexion    Ankle plantarflexion    Ankle inversion    Ankle eversion     (Blank rows = not tested)  02/12/2023 Berg 30/56    GAIT: Distance walked: In the clinic Assistive device utilized: Single point cane Level of assistance: Complete Independence Comments: Illyanna notes that she holds onto her husband's arm when she walks without her cane  TODAY'S TREATMENT:                                                                                                                              DATE:  04/23/2023 Lumbar extension AROM 10 x 3 seconds Shoulder blade pinches 10 x 5 seconds Alternating hip hike 2 sets of 10 for 3 seconds (emphasis on avoiding sliding the foot forward) Bridging 10X 5 seconds Hamstrings stretch 4x 20 seconds (other leg straight, supine) Figure 4 stretch supine 4x 20 seconds  Functional Activities:  Tap-up on 8 inch step 20X each and step-up and  over 20X Dynamic drills- long strides with narrow stance Slow marching 20X (try for) 3 seconds Sit to stand 5 x slow eccentrics, no hands, scapular retraction and lumbar extension at the top   04/02/2023 Lumbar extension AROM 10 x 3 seconds Shoulder blade pinches 10 x 5 seconds Alternating hip hike 2 sets of 10 for 3 seconds (emphasis on avoiding sliding the foot forward) Bridging 10X 5 seconds Hamstrings stretch 4x 20 seconds (other leg straight, supine) Figure 4 stretch supine 4x 20 seconds  Functional Activities:  Tap-up on 8 inch step 20X each and step-up and over 10X Dynamic drills- long strides with narrow stance Slow marching 20X (try for) 3 seconds Sit to stand 2 sets of 5  slow eccentrics, no hands Modified Berg Test 36/56   03/13/2023 Lumbar extension AROM 10 x 3 seconds Shoulder blade pinches 10 x 5 seconds Alternating hip hike 2 sets of 10 for 3 seconds (emphasis on avoiding sliding the foot forward) Bridging 10X 5 seconds Hamstrings stretch 5x 20 seconds (other leg straight, supine) Figure 4 stretch supine 5x 20 seconds  Functional Activities:  Tap-up on 8 inch step 20X each Dynamic drills- long strides  Slow marching 20X (try for) 3 seconds Sit to stand 2 sets of 5 slow eccentrics, no hands   PATIENT EDUCATION:  Education details: Briefly reviewed home exercise program results and encouraged Syretta to show up on time for her appointment as she was 15 minutes late today Person educated: Patient Education method: Explanation, Demonstration, Tactile cues, Verbal cues, and Handouts Education comprehension: verbalized understanding, returned demonstration, verbal cues required, tactile cues required, and needs further education  HOME EXERCISE PROGRAM: Access Code: [email or web address] URL: [email or web address] Date: 03/06/2023 Prepared by: Pauletta Browns  Exercises - Standing Lumbar Extension at Wall - Forearms  - 5 x daily - 7 x weekly - 1 sets - 5 reps -  3 seconds hold - Standing Scapular Retraction  - 5 x daily - 7 x weekly - 1 sets - 5 reps - 5 second hold - Heel Toe Raises with Counter Support  - 2 x daily - 7 x weekly - 1 sets - 20 reps - 3 seconds hold - Sit to Stand with Armchair  - 2 x daily - 7 x weekly - 1 sets - 5 reps - Tandem Stance  - 2 x daily - 7 x weekly - 1 sets - 5 reps - 20 second hold - Yoga Bridge  - 2 x daily - 7 x weekly - 1 sets - 10 reps - 5 seconds hold - Standing Hip Hiking  - 3-5 x daily - 7 x weekly - 1 sets - 10 reps - 3 seconds hold - Standing Row with Anchored Resistance Band with PLB  - 1-2 x daily - 1 x weekly - 1 sets - 20 reps - 3 seconds hold - Supine Hamstring Stretch  - 2 x daily - 7 x weekly - 1 sets - 5 reps - 20 seconds hold - Supine Figure 4 Piriformis Stretch  - 2 x daily - 7 x weekly - 1 sets - 5 reps - 20 seconds hold  ASSESSMENT:  CLINICAL IMPRESSION: Hayvin has attended 2 supervised PT visits in the last 6 weeks due to travel.  Objective progress is noted with her initial visits of supervised physical therapy.  She has improved her Berg balance score, although she remains a high risk of future falls.  Her low back pain has been limiting and she is working on getting an appointment to schedule a kyphoplasty.  The focus of Maahi's spine rehabilitation remains postural strength and awareness to avoid flexion and increasing strain on her 3 compression fractures until she is able to get her kyphoplasty done.  Balance is still focused on improving reaction time, decreasing stride width and making her more stable to reduce her risk of future falls.  Alaire will continue to benefit from the recommended plan of care and another month of supervised physical therapy, or at least up until her procedure is scheduled.  OBJECTIVE IMPAIRMENTS: Abnormal gait, decreased activity tolerance, decreased balance, decreased endurance, decreased knowledge of condition, difficulty walking, decreased ROM, decreased  strength, decreased safety awareness, impaired perceived functional ability, improper body  mechanics, postural dysfunction, and pain.   ACTIVITY LIMITATIONS: carrying, lifting, bending, sitting, standing, squatting, and locomotion level  PARTICIPATION LIMITATIONS: meal prep, cleaning, and community activity  PERSONAL FACTORS: Previous right foot surgery, lumbar decompression/laminectomy in 2022, left THA in 2021 are also affecting patient's functional outcome.   REHAB POTENTIAL: Good  CLINICAL DECISION MAKING: Stable/uncomplicated  EVALUATION COMPLEXITY: Low   GOALS: Goals reviewed with patient? Yes  SHORT TERM GOALS: Target date: 03/05/2023  Siyah will be independent with her day 1 home exercise program Baseline: Started 02/05/2023 Goal status: Met 03/06/2023  2.  Establish a baseline Berg balance score Baseline: Deferred today secondary to Jaz being 15 minutes late for her appointment Goal status: Met 02/12/2023   LONG TERM GOALS: Target date: 05/01/2023  Improve FOTO to 63 in 11 visits Baseline: 51 Goal status: On Going 03/13/2023  2.  Bertice will report low back pain consistently 0-3 out of 10 on the numeric pain rating scale Baseline: 0-5 out of 10 Goal status: On Going 04/23/2023  3.  Improve Berg balance scale scores to allow Olivea to be independent and safe with an appropriate assistive device at discharge Baseline: Will be assessed visit 2 secondary to this patient arriving 15 minutes late at evaluation Goal status: Partially Met 04/02/2023  4.  Mayar will be able to demonstrate appropriate sitting and standing postures to help avoid increases in low back pain Baseline: Education will be started visit 2 Goal status: Partially Met (inconsistent) 04/23/2023  5.  Brityn will be independent with her long-term home exercise program at discharge Baseline: Started 02/05/2023 Goal status: On Going 04/23/2023   PLAN:  PT FREQUENCY: 1-2x/week  PT DURATION:  1-2 weeks  PLANNED INTERVENTIONS: Therapeutic exercises, Therapeutic activity, Neuromuscular re-education, Balance training, Gait training, Patient/Family education, Self Care, Stair training, Vestibular training, Canalith repositioning, Dry Needling, Spinal mobilization, Cryotherapy, and Manual therapy.  PLAN FOR NEXT SESSION: Will need a progress note before current authorization expires 05/01/2023  Cherlyn Cushing, PT, MPT 04/23/2023, 4:19 PM

## 2023-05-04 ENCOUNTER — Ambulatory Visit: Payer: Medicare PPO | Admitting: Rehabilitative and Restorative Service Providers"

## 2023-05-04 ENCOUNTER — Encounter: Payer: Self-pay | Admitting: Rehabilitative and Restorative Service Providers"

## 2023-05-04 DIAGNOSIS — R2689 Other abnormalities of gait and mobility: Secondary | ICD-10-CM

## 2023-05-04 DIAGNOSIS — M5459 Other low back pain: Secondary | ICD-10-CM

## 2023-05-04 DIAGNOSIS — R293 Abnormal posture: Secondary | ICD-10-CM | POA: Diagnosis not present

## 2023-05-04 DIAGNOSIS — R262 Difficulty in walking, not elsewhere classified: Secondary | ICD-10-CM | POA: Diagnosis not present

## 2023-05-04 NOTE — Therapy (Signed)
OUTPATIENT PHYSICAL THERAPY THORACOLUMBAR TREATMENT/RE-CERTIFICATION NOTE    Referring diagnosis?  Diagnosis  M54.9 (ICD-10-CM) - Back pain   Treatment diagnosis? (if different than referring diagnosis) R29.3   R26.2   R26.89   M54.59 What was this (referring dx) caused by? []  Surgery []  Fall [x]  Ongoing issue [x]  Arthritis [x]  Other: ______Compression fractures (3)______  Laterality: []  Rt []  Lt [x]  Both  Check all possible CPT codes:  *CHOOSE 10 OR LESS*    []  97110 (Therapeutic Exercise)  []  92507 (SLP Treatment)  []  97112 (Neuro Re-ed)   []  92526 (Swallowing Treatment)   []  40981 (Gait Training)   []  [id number] (Cognitive Training, 1st 15 minutes) []  97140 (Manual Therapy)   []  97130 (Cognitive Training, each add'l 15 minutes)  []  97164 (Re-evaluation)                              []  Other, List CPT Code ____________  []  97530 (Therapeutic Activities)     []  97535 (Self Care)   [x]  All codes above (97110 - 97535)  []  97012 (Mechanical Traction)  []  97014 (E-stim Unattended)  []  97032 (E-stim manual)  []  97033 (Ionto)  []  97035 (Ultrasound) []  97750 (Physical Performance Training) []  [id number] (Aquatic Therapy) []  97016 (Vasopneumatic Device) []  [id number] (Paraffin) []  97034 (Contrast Bath) []  97597 (Wound Care 1st 20 sq cm) []  97598 (Wound Care each add'l 20 sq cm) []  97760 (Orthotic Fabrication, Fitting, Training Initial) []  [id number] (Prosthetic Management and Training Initial) []  [id number] (Orthotic or Prosthetic Training/ Modification Subsequent)   Patient Name: ZURISADAI DONABEDIAN MRN: [id number] DOB:12/06/1940, 82 y.o., female Today's Date: 05/04/2023  Referring diagnosis?  Diagnosis  M54.9 (ICD-10-CM) - Back pain   END OF SESSION:  PT End of Session - 05/04/23 1358     Visit Number 12    Number of Visits 16    Date for PT Re-Evaluation 06/22/23    Authorization Type [employer]    Authorization - Visit Number 12    Authorization - Number of Visits 24    Progress Note  Due on Visit 21    PT Start Time 1356    PT Stop Time 1438    PT Time Calculation (min) 42 min    Equipment Utilized During Treatment Gait belt    Activity Tolerance Patient tolerated treatment well;No increased pain    Behavior During Therapy [facility] for tasks assessed/performed              Past Medical History:  Diagnosis Date   Arthritis    Herniated lumbar intervertebral disc 2022   Hypothyroidism    Incontinence    Seasonal allergies    UTI (urinary tract infection) 06/2021   Past Surgical History:  Procedure Laterality Date   DILATION AND CURETTAGE OF UTERUS     x2   FOOT SURGERY Right    HYSTEROSCOPY WITH D & C N/A 08/14/2016   Procedure: DILATATION AND CURETTAGE /HYSTEROSCOPY;  Surgeon: Olivia Mackie, MD;  Location: [facility];  Service: Gynecology;  Laterality: N/A;   JOINT REPLACEMENT Left 02/03/2020   LUMBAR LAMINECTOMY/ DECOMPRESSION WITH MET-RX Right 08/20/2021   Procedure: Right Thoracic 12-Lumbar 1 Minimally invasive discectomy with metrx;  Surgeon: Jadene Pierini, MD;  Location: [facility];  Service: Neurosurgery;  Laterality: Right;  3C/RM 19   TONSILLECTOMY     TOTAL HIP ARTHROPLASTY Left 02/03/2020   Procedure: LEFT TOTAL HIP ARTHROPLASTY ANTERIOR APPROACH;  Surgeon: Doneen Poisson  Y, MD;  Location: [facility];  Service: Orthopedics;  Laterality: Left;   Patient Active Problem List   Diagnosis Date Noted   Lumbar radiculopathy 08/20/2021   Status post total replacement of left hip 02/03/2020   Unilateral primary osteoarthritis, left hip 12/01/2019   Low back pain 10/27/2019   Right hip pain 07/14/2017   Chest pain 06/11/2015   Hypothyroidism 06/11/2015    PCP: Chilton Greathouse, MD  REFERRING PROVIDER: Chilton Greathouse, MD   REFERRING DIAG:  Diagnosis  M54.9 (ICD-10-CM) - Back pain    Rationale for Evaluation and Treatment: Rehabilitation  THERAPY DIAG:  Abnormal posture - Plan: PT plan of care cert/re-cert  Difficulty in walking, not  elsewhere classified - Plan: PT plan of care cert/re-cert  Other abnormalities of gait and mobility - Plan: PT plan of care cert/re-cert  Other low back pain - Plan: PT plan of care cert/re-cert  ONSET DATE: Chronic  SUBJECTIVE:                                                                                                                                                                                           SUBJECTIVE STATEMENT: Lucenda is limited by back pain related to her compression fractures (3).  She reports inconsistent home exercise program compliance due to travel.  She also reported she is trying to get an appointment to get a kyphoplasty as her most recent imaging showed a slight worsening of her compression fractures.  PERTINENT HISTORY:  Previous right foot surgery, lumbar decompression/laminectomy in 2022, left THA in 2021  PAIN:  Are you having pain? Yes: NPRS scale: 0-6/10 this week on a 10/10 Pain location: Right > left low back pain Pain description: Achy, sore Aggravating factors: Standing, walking, weight-bearing Relieving factors: Sit, rest  PRECAUTIONS: Back and Fall  WEIGHT BEARING RESTRICTIONS: No  FALLS:  Has patient fallen in last 6 months? Yes. Number of falls 1, no injuries  LIVING ENVIRONMENT: Lives with: lives with their family and lives with their spouse Lives in: House/apartment Stairs:  Does OK in a 3 story house Has following equipment at home: Single point cane and Grab bars  OCCUPATION: Retired  PLOF: Independent with household mobility with device  PATIENT GOALS: Return to walking without the cane with improved standing and walking endurance  NEXT MD VISIT: January 2025  OBJECTIVE:   DIAGNOSTIC FINDINGS:  IMPRESSION: 1. T12-L1 severe right neural foraminal narrowing. 2. L5-S1 mild right neural foraminal narrowing.  PATIENT SURVEYS:  Eval FOTO 51 (Goal 63) in 11 visits  03/13/2023 FOTO 52  05/04/2023 FOTO 50  SCREENING  FOR RED FLAGS: Bowel or bladder  incontinence: No Spinal tumors: No Cauda equina syndrome: No Compression fracture: Yes: 3  COGNITION: Overall cognitive status: Within functional limits for tasks assessed     SENSATION: No complaints of peripheral pains or paresthesias.  Notes some left LE edema.  MUSCLE LENGTH: Hamstrings:  Thomas test:   POSTURE: rounded shoulders, forward head, and decreased lumbar lordosis   LUMBAR ROM:   AROM 02/05/2023 03/06/2023 05/04/2023  Flexion     Extension 10 10 15   Right lateral flexion     Left lateral flexion     Right rotation     Left rotation      (Blank rows = not tested)  LOWER EXTREMITY ROM:     Active  Left/Right 03/06/2023 Left/Right 05/04/2023  Hip flexion 100/100 105/100  Hip extension    Hip abduction    Hip adduction    Hip internal rotation 3/3 12/1  Hip external rotation 29/29 30/33  Hamstrings  40/40 40/45  Knee extension    Ankle dorsiflexion    Ankle plantarflexion    Ankle inversion    Ankle eversion     (Blank rows = not tested)  LOWER EXTREMITY STRENGTH:    MMT Right eval Left eval  Hip flexion    Hip extension    Hip abduction    Hip adduction    Hip internal rotation    Hip external rotation    Knee flexion    Knee extension    Ankle dorsiflexion    Ankle plantarflexion    Ankle inversion    Ankle eversion     (Blank rows = not tested)  05/04/2023 Berg 40/56  03/06/2023 Berg 36/56  02/12/2023 Berg 30/56   [facility] PT Assessment - 05/04/23 0001       Berg Balance Test   Sit to Stand Able to stand  independently using hands    Standing Unsupported Able to stand safely 2 minutes    Sitting with Back Unsupported but Feet Supported on Floor or Stool Able to sit safely and securely 2 minutes    Stand to Sit Sits safely with minimal use of hands    Transfers Able to transfer safely, definite need of hands    Standing Unsupported with Eyes Closed Able to stand 10 seconds safely    Standing Unsupported  with Feet Together Able to place feet together independently and stand 1 minute safely    From Standing, Reach Forward with Outstretched Arm Can reach forward >5 cm safely (2")    From Standing Position, Pick up Object from Floor Able to pick up shoe, needs supervision    From Standing Position, Turn to Look Behind Over each Shoulder Looks behind one side only/other side shows less weight shift    Turn 360 Degrees Able to turn 360 degrees safely but slowly    Standing Unsupported, Alternately Place Feet on Step/Stool Able to complete >2 steps/needs minimal assist    Standing Unsupported, One Foot in Front Able to take small step independently and hold 30 seconds    Standing on One Leg Tries to lift leg/unable to hold 3 seconds but remains standing independently    Total Score 40             GAIT: Distance walked: In the clinic Assistive device utilized: Single point cane Level of assistance: Complete Independence Comments: Carys notes that she holds onto her husband's arm when she walks without her cane  TODAY'S TREATMENT:  DATE:  05/04/2023 Lumbar extension AROM 5 x 3 seconds Shoulder blade pinches 5 x 5 seconds Alternating hip hike (HEP) Bridging 10 x 5 seconds Hamstrings stretch 4 x 20 seconds (other leg straight, supine) Figure 4 stretch supine 4 x 20 seconds  Functional Activities:  Berg 40/56   04/23/2023 Lumbar extension AROM 10 x 3 seconds Shoulder blade pinches 10 x 5 seconds Alternating hip hike 2 sets of 10 for 3 seconds (emphasis on avoiding sliding the foot forward) Bridging 10X 5 seconds Hamstrings stretch 4x 20 seconds (other leg straight, supine) Figure 4 stretch supine 4x 20 seconds  Functional Activities:  Tap-up on 8 inch step 20X each and step-up and over 20X Dynamic drills- long strides with narrow stance Slow marching 20X (try  for) 3 seconds Sit to stand 5 x slow eccentrics, no hands, scapular retraction and lumbar extension at the top   04/02/2023 Lumbar extension AROM 10 x 3 seconds Shoulder blade pinches 10 x 5 seconds Alternating hip hike 2 sets of 10 for 3 seconds (emphasis on avoiding sliding the foot forward) Bridging 10X 5 seconds Hamstrings stretch 4x 20 seconds (other leg straight, supine) Figure 4 stretch supine 4x 20 seconds  Functional Activities:  Tap-up on 8 inch step 20X each and step-up and over 10X Dynamic drills- long strides with narrow stance Slow marching 20X (try for) 3 seconds Sit to stand 2 sets of 5 slow eccentrics, no hands Modified Berg Test 36/56   PATIENT EDUCATION:  Education details: Briefly reviewed home exercise program results and encouraged [name] to show up on time for her appointment as she was 15 minutes late today Person educated: Patient Education method: Explanation, Demonstration, Tactile cues, Verbal cues, and Handouts Education comprehension: verbalized understanding, returned demonstration, verbal cues required, tactile cues required, and needs further education  HOME EXERCISE PROGRAM: Access Code: [email or web address] URL: [email or web address] Date: 03/06/2023 Prepared by: Pauletta Browns  Exercises - Standing Lumbar Extension at Wall - Forearms  - 5 x daily - 7 x weekly - 1 sets - 5 reps - 3 seconds hold - Standing Scapular Retraction  - 5 x daily - 7 x weekly - 1 sets - 5 reps - 5 second hold - Heel Toe Raises with Counter Support  - 2 x daily - 7 x weekly - 1 sets - 20 reps - 3 seconds hold - Sit to Stand with Armchair  - 2 x daily - 7 x weekly - 1 sets - 5 reps - Tandem Stance  - 2 x daily - 7 x weekly - 1 sets - 5 reps - 20 second hold - Yoga Bridge  - 2 x daily - 7 x weekly - 1 sets - 10 reps - 5 seconds hold - Standing Hip Hiking  - 3-5 x daily - 7 x weekly - 1 sets - 10 reps - 3 seconds hold - Standing Row with Anchored Resistance Band with  PLB  - 1-2 x daily - 1 x weekly - 1 sets - 20 reps - 3 seconds hold - Supine Hamstring Stretch  - 2 x daily - 7 x weekly - 1 sets - 5 reps - 20 seconds hold - Supine Figure 4 Piriformis Stretch  - 2 x daily - 7 x weekly - 1 sets - 5 reps - 20 seconds hold  ASSESSMENT:  CLINICAL IMPRESSION: Lakietha has attended 3 supervised PT visits in the last 7-8 weeks due to travel.  Objective progress is noted with her lumbar AROM, bilateral lower  extremity flexibility and Berg balance score.  She remains a high risk of future falls.  Her low back pain has been limiting and she is still working on getting an appointment to schedule a kyphoplasty (4 weeks thus far).   I recommended that Nola actually go to the office of her spine doctor and ask them to call the radiology group she was referred to for kyphoplasty while she is there.  I also recommended that she then go to the radiology group and get an appointment in person (4 weeks is too long).   The focus of Nissa's spine rehabilitation remains postural strength and awareness to avoid flexion and increasing strain on her 3 compression fractures until she is able to get her kyphoplasty done.  Balance is still focused on improving reaction time, decreasing stride width and making her more stable to reduce her risk of future falls.  Itzia will continue to benefit from the recommended plan of care and another month of supervised physical therapy, or at least up until her procedure is scheduled.  OBJECTIVE IMPAIRMENTS: Abnormal gait, decreased activity tolerance, decreased balance, decreased endurance, decreased knowledge of condition, difficulty walking, decreased ROM, decreased strength, decreased safety awareness, impaired perceived functional ability, improper body mechanics, postural dysfunction, and pain.   ACTIVITY LIMITATIONS: carrying, lifting, bending, sitting, standing, squatting, and locomotion level  PARTICIPATION LIMITATIONS: meal prep,  cleaning, and community activity  PERSONAL FACTORS: Previous right foot surgery, lumbar decompression/laminectomy in 2022, left THA in 2021 are also affecting patient's functional outcome.   REHAB POTENTIAL: Good  CLINICAL DECISION MAKING: Stable/uncomplicated  EVALUATION COMPLEXITY: Low   GOALS: Goals reviewed with patient? Yes  SHORT TERM GOALS: Target date: 03/05/2023  Allayah will be independent with her day 1 home exercise program Baseline: Started 02/05/2023 Goal status: Met 03/06/2023  2.  Establish a baseline Berg balance score Baseline: Deferred today secondary to Prissy being 15 minutes late for her appointment Goal status: Met 02/12/2023   LONG TERM GOALS: Target date: 06/22/2023  Improve FOTO to 63 in 11 visits Baseline: 51 Goal status: On Going 05/04/2023  2.  Legacee will report low back pain consistently 0-3 out of 10 on the numeric pain rating scale Baseline: 0-5 out of 10 Goal status: On Going 05/04/2023  3.  Improve Berg balance scale scores to allow Leighan to be independent and safe with an appropriate assistive device at discharge Baseline: Will be assessed visit 2 secondary to this patient arriving 15 minutes late at evaluation Goal status: Partially Met 05/04/2023  4.  Jamaal will be able to demonstrate appropriate sitting and standing postures to help avoid increases in low back pain Baseline: Education will be started visit 2 Goal status: Partially Met (inconsistent) 05/04/2023  5.  Marshae will be independent with her long-term home exercise program at discharge Baseline: Started 02/05/2023 Goal status: On Going 05/04/2023   PLAN:  PT FREQUENCY: 1-2x/week  PT DURATION: 6 weeks  PLANNED INTERVENTIONS: Therapeutic exercises, Therapeutic activity, Neuromuscular re-education, Balance training, Gait training, Patient/Family education, Self Care, Stair training, Vestibular training, Canalith repositioning, Dry Needling, Spinal mobilization,  Cryotherapy, and Manual therapy.  PLAN FOR NEXT SESSION: Did she get an appointment for kyphoplasty?  Work on low back strength and balance to reduce future falls risk.  Cherlyn Cushing, PT, MPT 05/04/2023, 2:49 PM

## 2023-05-05 ENCOUNTER — Other Ambulatory Visit (HOSPITAL_COMMUNITY): Payer: Self-pay | Admitting: Neuroradiology

## 2023-05-05 DIAGNOSIS — S32010A Wedge compression fracture of first lumbar vertebra, initial encounter for closed fracture: Secondary | ICD-10-CM

## 2023-05-06 ENCOUNTER — Ambulatory Visit: Payer: Medicare PPO | Admitting: Rehabilitative and Restorative Service Providers"

## 2023-05-06 ENCOUNTER — Encounter: Payer: Self-pay | Admitting: Rehabilitative and Restorative Service Providers"

## 2023-05-06 DIAGNOSIS — R262 Difficulty in walking, not elsewhere classified: Secondary | ICD-10-CM | POA: Diagnosis not present

## 2023-05-06 DIAGNOSIS — R293 Abnormal posture: Secondary | ICD-10-CM | POA: Diagnosis not present

## 2023-05-06 DIAGNOSIS — M5459 Other low back pain: Secondary | ICD-10-CM

## 2023-05-06 DIAGNOSIS — R2689 Other abnormalities of gait and mobility: Secondary | ICD-10-CM

## 2023-05-06 NOTE — Therapy (Signed)
OUTPATIENT PHYSICAL THERAPY THORACOLUMBAR TREATMENT NOTE    Referring diagnosis?  Diagnosis  M54.9 (ICD-10-CM) - Back pain   Treatment diagnosis? (if different than referring diagnosis) R29.3   R26.2   R26.89   M54.59 What was this (referring dx) caused by? []  Surgery []  Fall [x]  Ongoing issue [x]  Arthritis [x]  Other: ______Compression fractures (3)______  Laterality: []  Rt []  Lt [x]  Both  Check all possible CPT codes:  *CHOOSE 10 OR LESS*    []  97110 (Therapeutic Exercise)  []  16109 (SLP Treatment)  []  97112 (Neuro Re-ed)   []  92526 (Swallowing Treatment)   []  97116 (Gait Training)   []  [id number] (Cognitive Training, 1st 15 minutes) []  97140 (Manual Therapy)   []  97130 (Cognitive Training, each add'l 15 minutes)  []  97164 (Re-evaluation)                              []  Other, List CPT Code ____________  []  97530 (Therapeutic Activities)     []  97535 (Self Care)   [x]  All codes above (97110 - 97535)  []  97012 (Mechanical Traction)  []  97014 (E-stim Unattended)  []  97032 (E-stim manual)  []  97033 (Ionto)  []  97035 (Ultrasound) []  97750 (Physical Performance Training) []  [id number] (Aquatic Therapy) []  97016 (Vasopneumatic Device) []  [id number] (Paraffin) []  97034 (Contrast Bath) []  97597 (Wound Care 1st 20 sq cm) []  97598 (Wound Care each add'l 20 sq cm) []  97760 (Orthotic Fabrication, Fitting, Training Initial) []  [id number] (Prosthetic Management and Training Initial) []  [id number] (Orthotic or Prosthetic Training/ Modification Subsequent)   Patient Name: Brenda Ortiz MRN: [id number] DOB:1941/05/30, 82 y.o., female Today's Date: 05/06/2023  Referring diagnosis?  Diagnosis  M54.9 (ICD-10-CM) - Back pain   END OF SESSION:  PT End of Session - 05/06/23 1601     Visit Number 13    Number of Visits 24    Date for PT Re-Evaluation 06/22/23    Authorization Type [employer]    Authorization - Visit Number 13    Authorization - Number of Visits 24    Progress Note Due on Visit 22     PT Start Time 1517    PT Stop Time 1559    PT Time Calculation (min) 42 min    Equipment Utilized During Treatment Gait belt    Activity Tolerance Patient tolerated treatment well;No increased pain    Behavior During Therapy [facility] for tasks assessed/performed            Past Medical History:  Diagnosis Date   Arthritis    Herniated lumbar intervertebral disc 2022   Hypothyroidism    Incontinence    Seasonal allergies    UTI (urinary tract infection) 06/2021   Past Surgical History:  Procedure Laterality Date   DILATION AND CURETTAGE OF UTERUS     x2   FOOT SURGERY Right    HYSTEROSCOPY WITH D & C N/A 08/14/2016   Procedure: DILATATION AND CURETTAGE /HYSTEROSCOPY;  Surgeon: Olivia Mackie, MD;  Location: [facility];  Service: Gynecology;  Laterality: N/A;   JOINT REPLACEMENT Left 02/03/2020   LUMBAR LAMINECTOMY/ DECOMPRESSION WITH MET-RX Right 08/20/2021   Procedure: Right Thoracic 12-Lumbar 1 Minimally invasive discectomy with metrx;  Surgeon: Jadene Pierini, MD;  Location: [facility] OR;  Service: Neurosurgery;  Laterality: Right;  3C/RM 19   TONSILLECTOMY     TOTAL HIP ARTHROPLASTY Left 02/03/2020   Procedure: LEFT TOTAL HIP ARTHROPLASTY ANTERIOR APPROACH;  Surgeon: Kathryne Hitch, MD;  Location: [facility];  Service: Orthopedics;  Laterality: Left;   Patient Active Problem List   Diagnosis Date Noted   Lumbar radiculopathy 08/20/2021   Status post total replacement of left hip 02/03/2020   Unilateral primary osteoarthritis, left hip 12/01/2019   Low back pain 10/27/2019   Right hip pain 07/14/2017   Chest pain 06/11/2015   Hypothyroidism 06/11/2015    PCP: Chilton Greathouse, MD  REFERRING PROVIDER: Chilton Greathouse, MD   REFERRING DIAG:  Diagnosis  M54.9 (ICD-10-CM) - Back pain    Rationale for Evaluation and Treatment: Rehabilitation  THERAPY DIAG:  Abnormal posture  Difficulty in walking, not elsewhere classified  Other abnormalities of gait and  mobility  Other low back pain  ONSET DATE: Chronic  SUBJECTIVE:                                                                                                                                                                                           SUBJECTIVE STATEMENT: After taking my advice to show up in person, [name] was finally able to get an appointment to get a kyphoplasty.  PERTINENT HISTORY:  Previous right foot surgery, lumbar decompression/laminectomy in 2022, left THA in 2021  PAIN:  Are you having pain? Yes: NPRS scale: 0-6/10 this week on a 10/10 Pain location: Right > left low back pain Pain description: Achy, sore Aggravating factors: Standing, walking, weight-bearing Relieving factors: Sit, rest  PRECAUTIONS: Back and Fall  WEIGHT BEARING RESTRICTIONS: No  FALLS:  Has patient fallen in last 6 months? Yes. Number of falls 1, no injuries  LIVING ENVIRONMENT: Lives with: lives with their family and lives with their spouse Lives in: House/apartment Stairs:  Does OK in a 3 story house Has following equipment at home: Single point cane and Grab bars  OCCUPATION: Retired  PLOF: Independent with household mobility with device  PATIENT GOALS: Return to walking without the cane with improved standing and walking endurance  NEXT MD VISIT: January 2025  OBJECTIVE:   DIAGNOSTIC FINDINGS:  IMPRESSION: 1. T12-L1 severe right neural foraminal narrowing. 2. L5-S1 mild right neural foraminal narrowing.  PATIENT SURVEYS:  Eval FOTO 51 (Goal 63) in 11 visits  03/13/2023 FOTO 52  05/04/2023 FOTO 50  SCREENING FOR RED FLAGS: Bowel or bladder incontinence: No Spinal tumors: No Cauda equina syndrome: No Compression fracture: Yes: 3  COGNITION: Overall cognitive status: Within functional limits for tasks assessed     SENSATION: No complaints of peripheral pains or paresthesias.  Notes some left LE edema.  MUSCLE LENGTH: Hamstrings:  Thomas test:    POSTURE: rounded shoulders, forward head, and decreased lumbar lordosis  LUMBAR ROM:   AROM 02/05/2023 03/06/2023 05/04/2023  Flexion     Extension 10 10 15   Right lateral flexion     Left lateral flexion     Right rotation     Left rotation      (Blank rows = not tested)  LOWER EXTREMITY ROM:     Active  Left/Right 03/06/2023 Left/Right 05/04/2023  Hip flexion 100/100 105/100  Hip extension    Hip abduction    Hip adduction    Hip internal rotation 3/3 12/1  Hip external rotation 29/29 30/33  Hamstrings  40/40 40/45  Knee extension    Ankle dorsiflexion    Ankle plantarflexion    Ankle inversion    Ankle eversion     (Blank rows = not tested)  LOWER EXTREMITY STRENGTH:    MMT Right eval Left eval  Hip flexion    Hip extension    Hip abduction    Hip adduction    Hip internal rotation    Hip external rotation    Knee flexion    Knee extension    Ankle dorsiflexion    Ankle plantarflexion    Ankle inversion    Ankle eversion     (Blank rows = not tested)  05/04/2023 Berg 40/56  03/06/2023 Berg 36/56  02/12/2023 Berg 30/56   GAIT: Distance walked: In the clinic Assistive device utilized: Single point cane Level of assistance: Complete Independence Comments: Gaylee notes that she holds onto her husband's arm when she walks without her cane  TODAY'S TREATMENT:                                                                                                                              DATE:  05/06/2023 Lumbar extension AROM 10 x 3 seconds Shoulder blade pinches 10 x 5 seconds Alternating hip hike 2 sets of 10 for 3 seconds (emphasis on avoiding sliding the foot forward) Bridging 10 x 5 seconds Hamstrings stretch 4 x 20 seconds (other leg straight, supine) Figure 4 stretch supine 4 x 20 seconds  Functional Activities:  Tap-up on 8 inch step 20X each and step-up and over 6 inch step 20X Dynamic drills- long strides with narrow stance Heel to toe  raises without hands 10 x 3 seconds Tandem balance 3 x 20 seconds Slow marching 10 laps Turns with all dynamic activities without hands   05/04/2023 Lumbar extension AROM 5 x 3 seconds Shoulder blade pinches 5 x 5 seconds Alternating hip hike (HEP) Bridging 10 x 5 seconds Hamstrings stretch 4 x 20 seconds (other leg straight, supine) Figure 4 stretch supine 4 x 20 seconds  Functional Activities:  Berg 40/56   04/23/2023 Lumbar extension AROM 10 x 3 seconds Shoulder blade pinches 10 x 5 seconds Alternating hip hike 2 sets of 10 for 3 seconds (emphasis on avoiding sliding the foot forward) Bridging 10X 5 seconds Hamstrings stretch 4x 20 seconds (other leg straight, supine) Figure 4  stretch supine 4x 20 seconds  Functional Activities:  Tap-up on 8 inch step 20X each and step-up and over 20X Dynamic drills- long strides with narrow stance Slow marching 20X (try for) 3 seconds Sit to stand 5 x slow eccentrics, no hands, scapular retraction and lumbar extension at the top   PATIENT EDUCATION:  Education details: Briefly reviewed home exercise program results and encouraged Makenize to show up on time for her appointment as she was 15 minutes late today Person educated: Patient Education method: Explanation, Demonstration, Tactile cues, Verbal cues, and Handouts Education comprehension: verbalized understanding, returned demonstration, verbal cues required, tactile cues required, and needs further education  HOME EXERCISE PROGRAM: Access Code: [email or web address] URL: [email or web address] Date: 03/06/2023 Prepared by: Pauletta Browns  Exercises - Standing Lumbar Extension at Wall - Forearms  - 5 x daily - 7 x weekly - 1 sets - 5 reps - 3 seconds hold - Standing Scapular Retraction  - 5 x daily - 7 x weekly - 1 sets - 5 reps - 5 second hold - Heel Toe Raises with Counter Support  - 2 x daily - 7 x weekly - 1 sets - 20 reps - 3 seconds hold - Sit to Stand with Armchair  - 2 x  daily - 7 x weekly - 1 sets - 5 reps - Tandem Stance  - 2 x daily - 7 x weekly - 1 sets - 5 reps - 20 second hold - Yoga Bridge  - 2 x daily - 7 x weekly - 1 sets - 10 reps - 5 seconds hold - Standing Hip Hiking  - 3-5 x daily - 7 x weekly - 1 sets - 10 reps - 3 seconds hold - Standing Row with Anchored Resistance Band with PLB  - 1-2 x daily - 1 x weekly - 1 sets - 20 reps - 3 seconds hold - Supine Hamstring Stretch  - 2 x daily - 7 x weekly - 1 sets - 5 reps - 20 seconds hold - Supine Figure 4 Piriformis Stretch  - 2 x daily - 7 x weekly - 1 sets - 5 reps - 20 seconds hold  ASSESSMENT:  CLINICAL IMPRESSION: Sylvette is a high risk of future falls.  Her low back pain has been limiting and she now is making progress towards an appointment to schedule a kyphoplasty (4 weeks thus far).   The focus of Kanetra's spine rehabilitation remains postural strength and awareness to avoid flexion and increasing strain on her 3 compression fractures until she is able to get her kyphoplasty done.  Balance remains focused on improving reaction time, decreasing stride width, increasing stride length and making her more stable to reduce her risk of future falls.    OBJECTIVE IMPAIRMENTS: Abnormal gait, decreased activity tolerance, decreased balance, decreased endurance, decreased knowledge of condition, difficulty walking, decreased ROM, decreased strength, decreased safety awareness, impaired perceived functional ability, improper body mechanics, postural dysfunction, and pain.   ACTIVITY LIMITATIONS: carrying, lifting, bending, sitting, standing, squatting, and locomotion level  PARTICIPATION LIMITATIONS: meal prep, cleaning, and community activity  PERSONAL FACTORS: Previous right foot surgery, lumbar decompression/laminectomy in 2022, left THA in 2021 are also affecting patient's functional outcome.   REHAB POTENTIAL: Good  CLINICAL DECISION MAKING: Stable/uncomplicated  EVALUATION COMPLEXITY:  Low   GOALS: Goals reviewed with patient? Yes  SHORT TERM GOALS: Target date: 03/05/2023  Jenney will be independent with her day 1 home exercise program Baseline: Started 02/05/2023 Goal status: Met 03/06/2023  2.  Establish a baseline Berg balance score Baseline: Deferred today secondary to Etheleen being 15 minutes late for her appointment Goal status: Met 02/12/2023   LONG TERM GOALS: Target date: 06/22/2023  Improve FOTO to 63 in 11 visits Baseline: 51 Goal status: On Going 05/04/2023  2.  Dore will report low back pain consistently 0-3 out of 10 on the numeric pain rating scale Baseline: 0-5 out of 10 Goal status: On Going 05/06/2023  3.  Improve Berg balance scale scores to allow Hemen to be independent and safe with an appropriate assistive device at discharge Baseline: Will be assessed visit 2 secondary to this patient arriving 15 minutes late at evaluation Goal status: Partially Met 05/04/2023  4.  Sharlyn will be able to demonstrate appropriate sitting and standing postures to help avoid increases in low back pain Baseline: Education will be started visit 2 Goal status: Partially Met (inconsistent) 05/06/2023  5.  Shanya will be independent with her long-term home exercise program at discharge Baseline: Started 02/05/2023 Goal status: On Going 05/06/2023   PLAN:  PT FREQUENCY: 1-2x/week  PT DURATION: 6 weeks  PLANNED INTERVENTIONS: Therapeutic exercises, Therapeutic activity, Neuromuscular re-education, Balance training, Gait training, Patient/Family education, Self Care, Stair training, Vestibular training, Canalith repositioning, Dry Needling, Spinal mobilization, Cryotherapy, and Manual therapy.  PLAN FOR NEXT SESSION: Did she get an appointment for kyphoplasty?  Work on low back strength and balance to reduce future falls risk with emphasis on activities deficient at last [facility] 05/04/2023.  Cherlyn Cushing, PT, MPT 05/06/2023, 4:07 PM

## 2023-05-07 ENCOUNTER — Other Ambulatory Visit (HOSPITAL_COMMUNITY): Payer: Self-pay | Admitting: Neuroradiology

## 2023-05-07 DIAGNOSIS — S32010A Wedge compression fracture of first lumbar vertebra, initial encounter for closed fracture: Secondary | ICD-10-CM

## 2023-05-08 ENCOUNTER — Ambulatory Visit (HOSPITAL_COMMUNITY)
Admission: RE | Admit: 2023-05-08 | Discharge: 2023-05-08 | Disposition: A | Discharge status: Home / Self Care | Payer: Medicare PPO | Source: Ambulatory Visit | Attending: Neuroradiology | Admitting: Neuroradiology

## 2023-05-08 DIAGNOSIS — S32010A Wedge compression fracture of first lumbar vertebra, initial encounter for closed fracture: Secondary | ICD-10-CM

## 2023-05-08 NOTE — Consult Note (Signed)
Chief Complaint: Patient was seen in consultation today for L1 compression fracture.  Referring Physician(s): Autumn Patty, MD  Supervising Physician: Baldemar Lenis  Patient Status: [facility] - Out-pt  History of Present Illness: Brenda Ortiz is a 82 y.o. female with a past medical history of osteoporosis, arthritis, hypothyroidism, prior lumbar laminectomy and left hip replacement. She had acute onset low back pain approximately 6 months ago. At that time, an MRI of the thoracic spine revealed an acute compression fracture of the L1 vertebral body. She has since had conservative management with pain medication, physical therapy and pilates. However, back pain persists. She says pain is about 3/10 while resting but can reach 6/10 while moving, particularly when standing up from seated position. Most recent MRI of the lumbar spine performed on Apr 02, 2023 shows persistent marrow  edema in the L1 vertebral body in addition to chronic degenerative changes. She comes today to discuss possible L1 balloon kyphoplasty.  Past Medical History:  Diagnosis Date   Arthritis    Herniated lumbar intervertebral disc 2022   Hypothyroidism    Incontinence    Seasonal allergies    UTI (urinary tract infection) 06/2021    Past Surgical History:  Procedure Laterality Date   DILATION AND CURETTAGE OF UTERUS     x2   FOOT SURGERY Right    HYSTEROSCOPY WITH D & C N/A 08/14/2016   Procedure: DILATATION AND CURETTAGE /HYSTEROSCOPY;  Surgeon: Olivia Mackie, MD;  Location: [facility];  Service: Gynecology;  Laterality: N/A;   JOINT REPLACEMENT Left 02/03/2020   LUMBAR LAMINECTOMY/ DECOMPRESSION WITH MET-RX Right 08/20/2021   Procedure: Right Thoracic 12-Lumbar 1 Minimally invasive discectomy with metrx;  Surgeon: Jadene Pierini, MD;  Location: [facility];  Service: Neurosurgery;  Laterality: Right;  3C/RM 19   TONSILLECTOMY     TOTAL HIP ARTHROPLASTY Left 02/03/2020   Procedure: LEFT  TOTAL HIP ARTHROPLASTY ANTERIOR APPROACH;  Surgeon: Kathryne Hitch, MD;  Location: [facility];  Service: Orthopedics;  Laterality: Left;    Allergies: Patient has no known allergies.  Medications: Prior to Admission medications   Medication Sig Start Date End Date Taking? Authorizing Provider  acetaminophen-codeine (TYLENOL #3) 300-30 MG tablet Take 1-2 tablets by mouth every 8 (eight) hours as needed. 08/21/21   Jadene Pierini, MD  calcium-vitamin D (OSCAL) 250-125 MG-UNIT per tablet Take 1 tablet by mouth at bedtime.     [provider]  fexofenadine (ALLEGRA) 180 MG tablet Take 180 mg by mouth daily as needed for allergies or rhinitis.    [provider]  Multiple Vitamin (MULTIVITAMIN WITH MINERALS) TABS tablet Take 1 tablet by mouth at bedtime.     [provider]  MYRBETRIQ 50 MG TB24 tablet Take 50 mg by mouth daily. 09/20/20   [provider]  olmesartan (BENICAR) 20 MG tablet Take 20 mg by mouth daily.    [provider]  PREMPRO 0.625-2.5 MG per tablet Take 1 tablet by mouth at bedtime.  08/28/13   [provider]  SYNTHROID 75 MCG tablet Take 75 mcg by mouth daily before breakfast.  09/12/13   [provider]     Family History  Problem Relation Age of Onset   Arthritis Mother    Arthritis Father    Diabetes Father    Diabetes Brother    Diabetes Paternal Grandmother     Social History   Socioeconomic History   Marital status: Married    Spouse name: Not  on file   Number of children: Not on file   Years of education: Not on file   Highest education level: Not on file  Occupational History   Not on file  Tobacco Use   Smoking status: Former    Packs/day: 0.50    Years: 10.00    Additional pack years: 0.00    Total pack years: 5.00    Types: Cigarettes    Quit date: 11/17/1970    Years since quitting: 52.5   Smokeless tobacco: Never  Vaping Use   Vaping Use: Never used  Substance and Sexual  Activity   Alcohol use: Yes    Alcohol/week: 0.0 standard drinks of alcohol    Comment: 2-3 times a week   Drug use: No   Sexual activity: Yes    Birth control/protection: Post-menopausal  Other Topics Concern   Not on file  Social History Narrative   Not on file   Social Determinants of Health   Financial Resource Strain: Not on file  Food Insecurity: Not on file  Transportation Needs: Not on file  Physical Activity: Not on file  Stress: Not on file  Social Connections: Not on file     Review of Systems: A 12 point ROS discussed and pertinent positives are indicated in the HPI above.  All other systems are negative.  Review of Systems  Vital Signs: There were no vitals taken for this visit.  Physical Exam Constitutional:      Appearance: Normal appearance.  Musculoskeletal:       Back:  Neurological:     General: No focal deficit present.     Mental Status: She is alert and oriented to person, place, and time. Mental status is at baseline.          Imaging: EXAM: MRI LUMBAR SPINE WITHOUT CONTRAST  TECHNIQUE: Multiplanar, multisequence MR imaging of the lumbar spine was performed. No intravenous contrast was administered.  COMPARISON: 11/21/2021  FINDINGS: Segmentation: Standard.  Alignment: Dextroscoliosis with apex at L3  Vertebrae: Modic type 1 signal changes at the opposing endplates of T12-L1. Modic type 2 signal changes at L1-2. Probable wedge compression fracture at L1 with less than 25% height loss. Mild bone marrow edema.  Conus medullaris and cauda equina: Conus extends to the L1 level. Conus and cauda equina appear normal.  Paraspinal and other soft tissues: Negative  Disc levels:  T12-L1: Prior right-sided micro discectomy. Small right asymmetric disc bulge with moderate right foraminal stenosis, exacerbated by scoliosis.  L1-L2: Right asymmetric disc bulge with endplate spurring. No spinal canal stenosis. Mild bilateral neural  foraminal stenosis.  L2-L3: Small disc bulge with endplate spurring. No spinal canal stenosis. Bilateral neural foraminal stenosis.  L3-L4: Small disc bulge with endplate spurring and mild facet arthrosis, left-greater-than-right. No spinal canal stenosis. No neural foraminal stenosis.  L4-L5: Moderate facet arthrosis. Normal disc. No spinal canal stenosis. No neural foraminal stenosis.  L5-S1: Moderate right facet arthrosis. Small right asymmetric disc bulge. No spinal canal stenosis. No neural foraminal stenosis.  Visualized sacrum: Normal.  IMPRESSION: 1. Probable wedge compression fracture at L1 with less than 25% height loss, new since 11/21/21 but otherwise age indeterminate. 2. Moderate right T12-L1 neural foraminal stenosis, exacerbated by scoliosis. 3. Mild bilateral L1-2 and L2-3 neural foraminal stenosis. 4. Moderate facet arthrosis at L4-5 and L5-S1 may serve as a source of local low back pain.   Electronically Signed By: Deatra Robinson M.D. On: 04/08/2023 00:52   Labs:  CBC: No results for  input(s): "WBC", "HGB", "HCT", "PLT" in the last 8760 hours.  COAGS: No results for input(s): "INR", "APTT" in the last 8760 hours.  BMP: No results for input(s): "NA", "K", "CL", "CO2", "GLUCOSE", "BUN", "CALCIUM", "CREATININE", "GFRNONAA", "GFRAA" in the last 8760 hours.  Invalid input(s): "CMP"  LIVER FUNCTION TESTS: No results for input(s): "BILITOT", "AST", "ALT", "ALKPHOS", "PROT", "ALBUMIN" in the last 8760 hours.  TUMOR MARKERS: No results for input(s): "AFPTM", "CEA", "CA199", "CHROMGRNA" in the last 8760 hours.  Assessment and Plan:  Catrice Zuleta is a pleasant 82 year old female with presumed osteoporotic fragility fracture of the L1 vertebral body with persistent marrow edema and intractable prolonged back pain despite conservative management.  I explained the procedure in details along with risks and benefits.  I discussed the multifactorial nature of  back pain and the importance of treatment of osteoporosis.  She informed me that she has been treating her osteoporosis with guidance from her primary care physician.  She would like to proceed with kyphoplasty given the impact that the back pain has had and her life, limiting activities of daily living.  Our scheduler will reach out to her to book the kyphoplasty under moderate sedation once they are able to obtain insurance approval.   Thank you for this consult.  I greatly enjoyed meeting CASHLYNN YEARWOOD and look forward to participating in her care.  A copy of this report was sent to the requesting provider on this date.  Electronically Signed: Baldemar Lenis, MD 05/08/2023, 3:40 PM   I spent a total of  30 Minutes   in face to face in clinical consultation, greater than 50% of which was counseling/coordinating care for osteoporotic fragility fracture.

## 2023-05-11 ENCOUNTER — Telehealth (HOSPITAL_COMMUNITY): Payer: Self-pay

## 2023-05-11 NOTE — Telephone Encounter (Signed)
Called to inform pt that insurance will take about 48 hours to get an answer. I will call to schedule procedure as soon as I hear back. AB

## 2023-05-13 ENCOUNTER — Encounter: Payer: Self-pay | Admitting: Rehabilitative and Restorative Service Providers"

## 2023-05-13 ENCOUNTER — Ambulatory Visit: Payer: Medicare PPO | Admitting: Rehabilitative and Restorative Service Providers"

## 2023-05-13 ENCOUNTER — Other Ambulatory Visit (HOSPITAL_COMMUNITY): Payer: Self-pay | Admitting: Student

## 2023-05-13 DIAGNOSIS — R2689 Other abnormalities of gait and mobility: Secondary | ICD-10-CM

## 2023-05-13 DIAGNOSIS — M5459 Other low back pain: Secondary | ICD-10-CM | POA: Diagnosis not present

## 2023-05-13 DIAGNOSIS — M5416 Radiculopathy, lumbar region: Secondary | ICD-10-CM

## 2023-05-13 DIAGNOSIS — R293 Abnormal posture: Secondary | ICD-10-CM | POA: Diagnosis not present

## 2023-05-13 DIAGNOSIS — R262 Difficulty in walking, not elsewhere classified: Secondary | ICD-10-CM | POA: Diagnosis not present

## 2023-05-13 NOTE — Therapy (Signed)
OUTPATIENT PHYSICAL THERAPY THORACOLUMBAR TREATMENT NOTE    Referring diagnosis?  Diagnosis  M54.9 (ICD-10-CM) - Back pain   Treatment diagnosis? (if different than referring diagnosis) R29.3   R26.2   R26.89   M54.59 What was this (referring dx) caused by? []  Surgery []  Fall [x]  Ongoing issue [x]  Arthritis [x]  Other: ______Compression fractures (3)______  Laterality: []  Rt []  Lt [x]  Both  Check all possible CPT codes:  *CHOOSE 10 OR LESS*    []  97110 (Therapeutic Exercise)  []  92507 (SLP Treatment)  []  97112 (Neuro Re-ed)   []  92526 (Swallowing Treatment)   []  97116 (Gait Training)   []  [id number] (Cognitive Training, 1st 15 minutes) []  97140 (Manual Therapy)   []  97130 (Cognitive Training, each add'l 15 minutes)  []  97164 (Re-evaluation)                              []  Other, List CPT Code ____________  []  21308 (Therapeutic Activities)     []  97535 (Self Care)   [x]  All codes above (97110 - 97535)  []  97012 (Mechanical Traction)  []  97014 (E-stim Unattended)  []  97032 (E-stim manual)  []  97033 (Ionto)  []  97035 (Ultrasound) []  97750 (Physical Performance Training) []  [id number] (Aquatic Therapy) []  97016 (Vasopneumatic Device) []  [id number] (Paraffin) []  97034 (Contrast Bath) []  97597 (Wound Care 1st 20 sq cm) []  97598 (Wound Care each add'l 20 sq cm) []  97760 (Orthotic Fabrication, Fitting, Training Initial) []  [id number] (Prosthetic Management and Training Initial) []  [id number] (Orthotic or Prosthetic Training/ Modification Subsequent)   Patient Name: Brenda Ortiz MRN: [id number] DOB:Jul 07, 1941, 82 y.o., female Today's Date: 05/13/2023  Referring diagnosis?  Diagnosis  M54.9 (ICD-10-CM) - Back pain   END OF SESSION:  PT End of Session - 05/13/23 1308     Visit Number 14    Number of Visits 24    Date for PT Re-Evaluation 06/22/23    Authorization Type [employer]    Authorization - Visit Number 14    Authorization - Number of Visits 24    Progress Note Due on Visit 22     PT Start Time 1307    PT Stop Time 1350    PT Time Calculation (min) 43 min    Equipment Utilized During Treatment Gait belt    Activity Tolerance Patient tolerated treatment well;No increased pain    Behavior During Therapy [facility] for tasks assessed/performed             Past Medical History:  Diagnosis Date   Arthritis    Herniated lumbar intervertebral disc 2022   Hypothyroidism    Incontinence    Seasonal allergies    UTI (urinary tract infection) 06/2021   Past Surgical History:  Procedure Laterality Date   DILATION AND CURETTAGE OF UTERUS     x2   FOOT SURGERY Right    HYSTEROSCOPY WITH D & C N/A 08/14/2016   Procedure: DILATATION AND CURETTAGE /HYSTEROSCOPY;  Surgeon: Brenda Mackie, MD;  Location: [facility];  Service: Gynecology;  Laterality: N/A;   JOINT REPLACEMENT Left 02/03/2020   LUMBAR LAMINECTOMY/ DECOMPRESSION WITH MET-RX Right 08/20/2021   Procedure: Right Thoracic 12-Lumbar 1 Minimally invasive discectomy with metrx;  Surgeon: Brenda Pierini, MD;  [facility];  Service: Neurosurgery;  Laterality: Right;  3C/RM 19   TONSILLECTOMY     TOTAL HIP ARTHROPLASTY Left 02/03/2020   Procedure: LEFT TOTAL HIP ARTHROPLASTY ANTERIOR APPROACH;  Surgeon: Brenda Hitch,  MD;  Location: [facility];  Service: Orthopedics;  Laterality: Left;   Patient Active Problem List   Diagnosis Date Noted   Lumbar radiculopathy 08/20/2021   Status post total replacement of left hip 02/03/2020   Unilateral primary osteoarthritis, left hip 12/01/2019   Low back pain 10/27/2019   Right hip pain 07/14/2017   Chest pain 06/11/2015   Hypothyroidism 06/11/2015    PCP: Brenda Greathouse, MD  REFERRING PROVIDER: Chilton Greathouse, MD   REFERRING DIAG:  Diagnosis  M54.9 (ICD-10-CM) - Back pain    Rationale for Evaluation and Treatment: Rehabilitation  THERAPY DIAG:  Abnormal posture  Difficulty in walking, not elsewhere classified  Other abnormalities of gait and  mobility  Other low back pain  ONSET DATE: Chronic  SUBJECTIVE:                                                                                                                                                                                           SUBJECTIVE STATEMENT: Brenda Ortiz has her kyphoplasty scheduled for Friday.  PERTINENT HISTORY:  Previous right foot surgery, lumbar decompression/laminectomy in 2022, left THA in 2021  PAIN:  Are you having pain? Yes: NPRS scale: 0-6/10 this week on a 10/10 Pain location: Right > left low back pain Pain description: Achy, sore Aggravating factors: Standing, walking, weight-bearing Relieving factors: Sit, rest  PRECAUTIONS: Back and Fall  WEIGHT BEARING RESTRICTIONS: No  FALLS:  Has patient fallen in last 6 months? Yes. Number of falls 1, no injuries  LIVING ENVIRONMENT: Lives with: lives with their family and lives with their spouse Lives in: House/apartment Stairs:  Does OK in a 3 story house Has following equipment at home: Single point cane and Grab bars  OCCUPATION: Retired  PLOF: Independent with household mobility with device  PATIENT GOALS: Return to walking without the cane with improved standing and walking endurance  NEXT MD VISIT: January 2025  OBJECTIVE:   DIAGNOSTIC FINDINGS:  IMPRESSION: 1. T12-L1 severe right neural foraminal narrowing. 2. L5-S1 mild right neural foraminal narrowing.  PATIENT SURVEYS:  Eval FOTO 51 (Goal 63) in 11 visits  03/13/2023 FOTO 52  05/04/2023 FOTO 50  SCREENING FOR RED FLAGS: Bowel or bladder incontinence: No Spinal tumors: No Cauda equina syndrome: No Compression fracture: Yes: 3  COGNITION: Overall cognitive status: Within functional limits for tasks assessed     SENSATION: No complaints of peripheral pains or paresthesias.  Notes some left LE edema.  MUSCLE LENGTH: Hamstrings:  Thomas test:   POSTURE: rounded shoulders, forward head, and decreased lumbar  lordosis   LUMBAR ROM:   AROM 02/05/2023 03/06/2023 05/04/2023  Flexion  Extension 10 10 15   Right lateral flexion     Left lateral flexion     Right rotation     Left rotation      (Blank rows = not tested)  LOWER EXTREMITY ROM:     Active  Left/Right 03/06/2023 Left/Right 05/04/2023  Hip flexion 100/100 105/100  Hip extension    Hip abduction    Hip adduction    Hip internal rotation 3/3 12/1  Hip external rotation 29/29 30/33  Hamstrings  40/40 40/45  Knee extension    Ankle dorsiflexion    Ankle plantarflexion    Ankle inversion    Ankle eversion     (Blank rows = not tested)  LOWER EXTREMITY STRENGTH:    MMT Right eval Left eval  Hip flexion    Hip extension    Hip abduction    Hip adduction    Hip internal rotation    Hip external rotation    Knee flexion    Knee extension    Ankle dorsiflexion    Ankle plantarflexion    Ankle inversion    Ankle eversion     (Blank rows = not tested)  05/04/2023 Berg 40/56  03/06/2023 Berg 36/56  02/12/2023 Berg 30/56   GAIT: Distance walked: In the clinic Assistive device utilized: Single point cane Level of assistance: Complete Independence Comments: Willine notes that she holds onto her husband's arm when she walks without her cane  TODAY'S TREATMENT:                                                                                                                              DATE:  05/13/2023 Lumbar extension AROM 5 x 3 seconds Shoulder blade pinches 5 x 5 seconds Alternating hip hike 2 sets of 10 for 3 seconds (emphasis on avoiding sliding the foot forward) Bridging 10 x 5 seconds Hamstrings stretch 4 x 20 seconds (other leg straight, supine) Figure 4 stretch supine 4 x 20 seconds  Functional Activities:  Tap-up on 8 inch step 20X each and step-up and over 6 inch step 20X without hands Dynamic drills- long strides with narrow stance -Heel to toe raises without hands 10 x 3 seconds (HEP only) Tandem  balance 4 x 20 seconds Slow marching 10 laps Turns with all dynamic activities without hands   05/06/2023 Lumbar extension AROM 10 x 3 seconds Shoulder blade pinches 10 x 5 seconds Alternating hip hike 2 sets of 10 for 3 seconds (emphasis on avoiding sliding the foot forward) Bridging 10 x 5 seconds Hamstrings stretch 4 x 20 seconds (other leg straight, supine) Figure 4 stretch supine 4 x 20 seconds  Functional Activities:  Tap-up on 8 inch step 20X each and step-up and over 6 inch step 20X Dynamic drills- long strides with narrow stance Heel to toe raises without hands 10 x 3 seconds Tandem balance 3 x 20 seconds Slow marching 10 laps Turns with all dynamic activities without  hands   05/04/2023 Lumbar extension AROM 5 x 3 seconds Shoulder blade pinches 5 x 5 seconds Alternating hip hike (HEP) Bridging 10 x 5 seconds Hamstrings stretch 4 x 20 seconds (other leg straight, supine) Figure 4 stretch supine 4 x 20 seconds  Functional Activities:  Berg 40/56   PATIENT EDUCATION:  Education details: Briefly reviewed home exercise program results and encouraged Evelynne to show up on time for her appointment as she was 15 minutes late today Person educated: Patient Education method: Explanation, Demonstration, Tactile cues, Verbal cues, and Handouts Education comprehension: verbalized understanding, returned demonstration, verbal cues required, tactile cues required, and needs further education  HOME EXERCISE PROGRAM: Access Code: [email or web address] URL: [email or web address] Date: 03/06/2023 Prepared by: Pauletta Browns  Exercises - Standing Lumbar Extension at Wall - Forearms  - 5 x daily - 7 x weekly - 1 sets - 5 reps - 3 seconds hold - Standing Scapular Retraction  - 5 x daily - 7 x weekly - 1 sets - 5 reps - 5 second hold - Heel Toe Raises with Counter Support  - 2 x daily - 7 x weekly - 1 sets - 20 reps - 3 seconds hold - Sit to Stand with Armchair  - 2 x daily - 7 x  weekly - 1 sets - 5 reps - Tandem Stance  - 2 x daily - 7 x weekly - 1 sets - 5 reps - 20 second hold - Yoga Bridge  - 2 x daily - 7 x weekly - 1 sets - 10 reps - 5 seconds hold - Standing Hip Hiking  - 3-5 x daily - 7 x weekly - 1 sets - 10 reps - 3 seconds hold - Standing Row with Anchored Resistance Band with PLB  - 1-2 x daily - 1 x weekly - 1 sets - 20 reps - 3 seconds hold - Supine Hamstring Stretch  - 2 x daily - 7 x weekly - 1 sets - 5 reps - 20 seconds hold - Supine Figure 4 Piriformis Stretch  - 2 x daily - 7 x weekly - 1 sets - 5 reps - 20 seconds hold  ASSESSMENT:  CLINICAL IMPRESSION: [place] remains a high risk of future falls.  She had a lot of hesitation with step activities today and needed encouragement to complete them without relying on her hands for balance.   The focus of Sharonica's spine rehabilitation remains postural strength and awareness to avoid flexion and increasing strain on her 3 compression fractures until her kyphoplasty which is scheduled in 2 days.  Balance remains focused on improving reaction time, decreasing stride width, increasing stride length and making her more stable to reduce her risk of future falls.    OBJECTIVE IMPAIRMENTS: Abnormal gait, decreased activity tolerance, decreased balance, decreased endurance, decreased knowledge of condition, difficulty walking, decreased ROM, decreased strength, decreased safety awareness, impaired perceived functional ability, improper body mechanics, postural dysfunction, and pain.   ACTIVITY LIMITATIONS: carrying, lifting, bending, sitting, standing, squatting, and locomotion level  PARTICIPATION LIMITATIONS: meal prep, cleaning, and community activity  PERSONAL FACTORS: Previous right foot surgery, lumbar decompression/laminectomy in 2022, left THA in 2021 are also affecting patient's functional outcome.   REHAB POTENTIAL: Good  CLINICAL DECISION MAKING: Stable/uncomplicated  EVALUATION COMPLEXITY:  Low   GOALS: Goals reviewed with patient? Yes  SHORT TERM GOALS: Target date: 03/05/2023  Mairi will be independent with her day 1 home exercise program Baseline: Started 02/05/2023 Goal status: Met 03/06/2023  2.  Establish a  baseline Berg balance score Baseline: Deferred today secondary to [place] being 15 minutes late for her appointment Goal status: Met 02/12/2023   LONG TERM GOALS: Target date: 06/22/2023  Improve FOTO to 63 in 11 visits Baseline: 51 Goal status: On Going 05/04/2023  2.  Kymiah will report low back pain consistently 0-3 out of 10 on the numeric pain rating scale Baseline: 0-5 out of 10 Goal status: On Going 05/13/2023  3.  Improve Berg balance scale scores to allow Ethelda to be independent and safe with an appropriate assistive device at discharge Baseline: Will be assessed visit 2 secondary to this patient arriving 15 minutes late at evaluation Goal status: Partially Met 05/04/2023  4.  Marolyn will be able to demonstrate appropriate sitting and standing postures to help avoid increases in low back pain Baseline: Education will be started visit 2 Goal status: Partially Met (inconsistent) 05/13/2023  5.  Faigy will be independent with her long-term home exercise program at discharge Baseline: Started 02/05/2023 Goal status: On Going 05/13/2023   PLAN:  PT FREQUENCY: 1-2x/week  PT DURATION: 6 weeks  PLANNED INTERVENTIONS: Therapeutic exercises, Therapeutic activity, Neuromuscular re-education, Balance training, Gait training, Patient/Family education, Self Care, Stair training, Vestibular training, Canalith repositioning, Dry Needling, Spinal mobilization, Cryotherapy, and Manual therapy.  PLAN FOR NEXT SESSION: Work on low back strength and balance to reduce future falls risk with emphasis on activities deficient at last [facility] 05/04/2023.  Cherlyn Cushing, PT, MPT 05/13/2023, 1:54 PM

## 2023-05-14 ENCOUNTER — Other Ambulatory Visit: Payer: Self-pay | Admitting: Internal Medicine

## 2023-05-14 NOTE — H&P (Signed)
Chief Complaint: Patient was seen in consultation today for L1 compression fracture at the request of de Marcial Pacas Rodrigues,[place]ia  Referring Physician(s): de Marcial Pacas Rodrigues,Katyucia  Supervising Physician: Baldemar Lenis  Patient Status: [facility] - Out-pt  History of Present Illness: Brenda Ortiz is a 82 y.o. female who underwent MR spine, at outside facility, due to complaint of persistent lower back pain 10/22/2022 that demonstrated acute to subacute L1 compression fracture new from previous exams.  Patient was referred by neurology to be evaluated for possible vertebroplasty/kyphoplasty procedure.  The patient consulted with Dr. Tommie Sams 05/08/2023 at [facility] for treatment options.  At that time Dr. Quay Burow explained treatment through kyphoplasty.  Patient decided at that time to proceed.  Past Medical History:  Diagnosis Date   Arthritis    Herniated lumbar intervertebral disc 2022   Hypothyroidism    Incontinence    Seasonal allergies    UTI (urinary tract infection) 06/2021    Past Surgical History:  Procedure Laterality Date   DILATION AND CURETTAGE OF UTERUS     x2   FOOT SURGERY Right    HYSTEROSCOPY WITH D & C N/A 08/14/2016   Procedure: DILATATION AND CURETTAGE /HYSTEROSCOPY;  Surgeon: Olivia Mackie, MD;  Location: [facility];  Service: Gynecology;  Laterality: N/A;   JOINT REPLACEMENT Left 02/03/2020   LUMBAR LAMINECTOMY/ DECOMPRESSION WITH MET-RX Right 08/20/2021   Procedure: Right Thoracic 12-Lumbar 1 Minimally invasive discectomy with metrx;  Surgeon: Jadene Pierini, MD;  Location: [facility] OR;  Service: Neurosurgery;  Laterality: Right;  3C/RM 19   TONSILLECTOMY     TOTAL HIP ARTHROPLASTY Left 02/03/2020   Procedure: LEFT TOTAL HIP ARTHROPLASTY ANTERIOR APPROACH;  Surgeon: Kathryne Hitch, MD;  Location: [facility];  Service: Orthopedics;  Laterality: Left;    Allergies: Patient has no known  allergies.  Medications: Prior to Admission medications   Medication Sig Start Date End Date Taking? Authorizing Provider  acetaminophen-codeine (TYLENOL #3) 300-30 MG tablet Take 1-2 tablets by mouth every 8 (eight) hours as needed. 08/21/21   Jadene Pierini, MD  calcium-vitamin D (OSCAL) 250-125 MG-UNIT per tablet Take 1 tablet by mouth at bedtime.     [provider]  fexofenadine (ALLEGRA) 180 MG tablet Take 180 mg by mouth daily as needed for allergies or rhinitis.    [provider]  Multiple Vitamin (MULTIVITAMIN WITH MINERALS) TABS tablet Take 1 tablet by mouth at bedtime.     [provider]  MYRBETRIQ 50 MG TB24 tablet Take 50 mg by mouth daily. 09/20/20   [provider]  olmesartan (BENICAR) 20 MG tablet Take 20 mg by mouth daily.    [provider]  PREMPRO 0.625-2.5 MG per tablet Take 1 tablet by mouth at bedtime.  08/28/13   [provider]  SYNTHROID 75 MCG tablet Take 75 mcg by mouth daily before breakfast.  09/12/13   [provider]     Family History  Problem Relation Age of Onset   Arthritis Mother    Arthritis Father    Diabetes Father    Diabetes Brother    Diabetes Paternal Grandmother     Social History   Socioeconomic History   Marital status: Married    Spouse name: Not on file   Number of children: Not on file   Years of education: Not on file   Highest education level: Not on file  Occupational History   Not on file  Tobacco Use  Smoking status: Former    Packs/day: 0.50    Years: 10.00    Additional pack years: 0.00    Total pack years: 5.00    Types: Cigarettes    Quit date: 11/17/1970    Years since quitting: 52.5   Smokeless tobacco: Never  Vaping Use   Vaping Use: Never used  Substance and Sexual Activity   Alcohol use: Yes    Alcohol/week: 0.0 standard drinks of alcohol    Comment: 2-3 times a week   Drug use: No   Sexual activity: Yes    Birth control/protection:  Post-menopausal  Other Topics Concern   Not on file  Social History Narrative   Not on file   Social Determinants of Health   Financial Resource Strain: Not on file  Food Insecurity: Not on file  Transportation Needs: Not on file  Physical Activity: Not on file  Stress: Not on file  Social Connections: Not on file    Code Status: Full code  Review of Systems: A 12 point ROS discussed and pertinent positives are indicated in the HPI above.  All other systems are negative.  Review of Systems  Constitutional:  Negative for chills and fever.  Respiratory:  Negative for chest tightness and shortness of breath.   Cardiovascular:  Positive for leg swelling. Negative for chest pain.  Gastrointestinal:  Negative for abdominal pain, diarrhea, nausea and vomiting.  Musculoskeletal:  Positive for back pain.  Neurological:  Negative for dizziness and headaches.    Vital Signs: BP (!) 178/73   Pulse 61   Temp (!) 97.2 F (36.2 C) (Temporal)   Resp 18   Ht 5\' 6"  (1.676 m)   Wt 150 lb (68 kg)   SpO2 97%   BMI 24.21 kg/m   Advance Care Plan: The advanced care plan/surrogate decision maker was discussed at the time of visit and documented in the medical record.    Physical Exam Vitals reviewed.  Cardiovascular:     Rate and Rhythm: Normal rate and regular rhythm.     Pulses: Normal pulses.     Heart sounds: Normal heart sounds.  Pulmonary:     Effort: Pulmonary effort is normal.     Breath sounds: Normal breath sounds.  Abdominal:     Palpations: Abdomen is soft.     Tenderness: There is no abdominal tenderness.  Musculoskeletal:     Right lower leg: Edema present.     Left lower leg: Edema present.     Comments: 2+ pitting edema  Skin:    General: Skin is warm and dry.  Neurological:     Mental Status: She is alert and oriented to person, place, and time.  Psychiatric:        Mood and Affect: Mood normal.        Behavior: Behavior normal.        Thought Content:  Thought content normal.        Judgment: Judgment normal.     Imaging: No results found.  Labs:  CBC: Recent Labs    05/15/23 0736  WBC 6.4  HGB 13.3  HCT 40.1  PLT 244    COAGS: No results for input(s): "INR", "APTT" in the last 8760 hours.  BMP: Recent Labs    05/15/23 0736  NA 138  K 3.7  CL 100  CO2 26  GLUCOSE 105*  BUN 17  CALCIUM 9.4  CREATININE 0.94  GFRNONAA >60    LIVER FUNCTION TESTS: No results for  input(s): "BILITOT", "AST", "ALT", "ALKPHOS", "PROT", "ALBUMIN" in the last 8760 hours.  TUMOR MARKERS: No results for input(s): "AFPTM", "CEA", "CA199", "CHROMGRNA" in the last 8760 hours.  Assessment and Plan:  82 year old female with PMHx significant for herniated lumbar intervertebral disc, arthritis, hypothyroidism and L1 compression fracture presents to IR for vertebroplasty/kyphoplasty with moderate sedation. Patient is NPO. Pre-procedural labs are pending.   Risks and benefits of L1 vertebral augmentation with moderate sedation were discussed with the patient including, but not limited to education regarding the natural healing process of compression fractures without intervention, bleeding, infection, cement migration which may cause spinal cord damage, paralysis, pulmonary embolism or even death.  This interventional procedure involves the use of X-rays and because of the nature of the planned procedure, it is possible that we will have prolonged use of X-ray fluoroscopy.  Potential radiation risks to you include (but are not limited to) the following: - A slightly elevated risk for cancer  several years later in life. This risk is typically less than 0.5% percent. This risk is low in comparison to the normal incidence of human cancer, which is 33% for women and 50% for men according to the [employer] Cancer Society. - Radiation induced injury can include skin redness, resembling a rash, tissue breakdown / ulcers and hair loss (which can be  temporary or permanent).   The likelihood of either of these occurring depends on the difficulty of the procedure and whether you are sensitive to radiation due to previous procedures, disease, or genetic conditions.   IF your procedure requires a prolonged use of radiation, you will be notified and given written instructions for further action.  It is your responsibility to monitor the irradiated area for the 2 weeks following the procedure and to notify your physician if you are concerned that you have suffered a radiation induced injury.    All of the patient's questions were answered, patient is agreeable to proceed.  Consent signed and in chart.   Thank you for this interesting consult.  I greatly enjoyed meeting KANIJA ROMAIN and look forward to participating in their care.  A copy of this report was sent to the requesting provider on this date.  Electronically Signed: Kennieth Francois, PA-C 05/15/2023, 8:31 AM   I spent a total of  30 Minutes   in face to face in clinical consultation, greater than 50% of which was counseling/coordinating care for L1 compression fracture.

## 2023-05-15 ENCOUNTER — Ambulatory Visit (HOSPITAL_COMMUNITY)
Admission: RE | Admit: 2023-05-15 | Discharge: 2023-05-15 | Disposition: A | Discharge status: Home / Self Care | Payer: Medicare PPO | Source: Ambulatory Visit | Attending: Neuroradiology | Admitting: Neuroradiology

## 2023-05-15 ENCOUNTER — Other Ambulatory Visit: Payer: Self-pay

## 2023-05-15 ENCOUNTER — Other Ambulatory Visit (HOSPITAL_COMMUNITY): Payer: Self-pay | Admitting: Neuroradiology

## 2023-05-15 DIAGNOSIS — M8088XA Other osteoporosis with current pathological fracture, vertebra(e), initial encounter for fracture: Secondary | ICD-10-CM | POA: Diagnosis not present

## 2023-05-15 DIAGNOSIS — R262 Difficulty in walking, not elsewhere classified: Secondary | ICD-10-CM | POA: Diagnosis not present

## 2023-05-15 DIAGNOSIS — S32010A Wedge compression fracture of first lumbar vertebra, initial encounter for closed fracture: Secondary | ICD-10-CM

## 2023-05-15 DIAGNOSIS — X58XXXA Exposure to other specified factors, initial encounter: Secondary | ICD-10-CM | POA: Insufficient documentation

## 2023-05-15 DIAGNOSIS — E039 Hypothyroidism, unspecified: Secondary | ICD-10-CM | POA: Insufficient documentation

## 2023-05-15 DIAGNOSIS — R293 Abnormal posture: Secondary | ICD-10-CM | POA: Diagnosis not present

## 2023-05-15 DIAGNOSIS — M8008XA Age-related osteoporosis with current pathological fracture, vertebra(e), initial encounter for fracture: Secondary | ICD-10-CM | POA: Insufficient documentation

## 2023-05-15 DIAGNOSIS — R600 Localized edema: Secondary | ICD-10-CM | POA: Diagnosis not present

## 2023-05-15 DIAGNOSIS — M5459 Other low back pain: Secondary | ICD-10-CM | POA: Diagnosis not present

## 2023-05-15 DIAGNOSIS — R2689 Other abnormalities of gait and mobility: Secondary | ICD-10-CM | POA: Diagnosis not present

## 2023-05-15 DIAGNOSIS — M5416 Radiculopathy, lumbar region: Secondary | ICD-10-CM

## 2023-05-15 HISTORY — PX: IR KYPHO LUMBAR INC FX REDUCE BONE BX UNI/BIL CANNULATION INC/IMAGING: IMG5519

## 2023-05-15 LAB — BASIC METABOLIC PANEL
Anion gap: 12 (ref 5–15)
BUN: 17 mg/dL (ref 8–23)
CO2: 26 mmol/L (ref 22–32)
Calcium: 9.4 mg/dL (ref 8.9–10.3)
Chloride: 100 mmol/L (ref 98–111)
Creatinine, Ser: 0.94 mg/dL (ref 0.44–1.00)
GFR, Estimated: >60 mL/min (ref >60)
Glucose, Bld: 105 mg/dL — ABNORMAL HIGH (ref 70–99)
Potassium: 3.7 mmol/L (ref 3.5–5.1)
Sodium: 138 mmol/L (ref 135–145)

## 2023-05-15 LAB — CBC
HCT: 40.1 % (ref 36.0–46.0)
Hemoglobin: 13.3 g/dL (ref 12.0–15.0)
MCH: 32.4 pg (ref 26.0–34.0)
MCHC: 33.2 g/dL (ref 30.0–36.0)
MCV: 97.6 fL (ref 80.0–100.0)
Platelets: 244 10*3/uL (ref 150–400)
RBC: 4.11 MIL/uL (ref 3.87–5.11)
RDW: 13.4 % (ref 11.5–15.5)
WBC: 6.4 10*3/uL (ref 4.0–10.5)
nRBC: 0 % (ref 0.0–0.2)

## 2023-05-15 MED ORDER — LIDOCAINE HCL (PF) 1 % IJ SOLN
INTRAMUSCULAR | Status: AC
  Filled 2023-05-15: qty 30

## 2023-05-15 MED ORDER — FENTANYL CITRATE (PF) 100 MCG/2ML IJ SOLN
INTRAMUSCULAR | Status: AC
  Filled 2023-05-15: qty 2

## 2023-05-15 MED ORDER — CEFAZOLIN SODIUM-DEXTROSE 2-4 GM/100ML-% IV SOLN
INTRAVENOUS | Status: AC | PRN
  Administered 2023-05-15: 2 g via INTRAVENOUS

## 2023-05-15 MED ORDER — ACETAMINOPHEN 325 MG PO TABS: 650.0000 mg | ORAL_TABLET | Freq: Four times a day (QID) | ORAL | Status: DC | PRN

## 2023-05-15 MED ORDER — MIDAZOLAM HCL 2 MG/2ML IJ SOLN
INTRAMUSCULAR | Status: AC | PRN
  Administered 2023-05-15 (×7): .5 mg via INTRAVENOUS

## 2023-05-15 MED ORDER — MIDAZOLAM HCL 2 MG/2ML IJ SOLN
INTRAMUSCULAR | Status: AC
  Filled 2023-05-15: qty 2

## 2023-05-15 MED ORDER — CEFAZOLIN SODIUM-DEXTROSE 2-4 GM/100ML-% IV SOLN
INTRAVENOUS | Status: AC
  Filled 2023-05-15: qty 100

## 2023-05-15 MED ORDER — LIDOCAINE HCL (PF) 1 % IJ SOLN
10.0000 mL | Freq: Once | INTRAMUSCULAR | Status: AC
  Administered 2023-05-15: 10 mL

## 2023-05-15 MED ORDER — SODIUM CHLORIDE 0.9 % IV SOLN: INTRAVENOUS | Status: DC

## 2023-05-15 MED ORDER — KETOROLAC TROMETHAMINE 30 MG/ML IJ SOLN
INTRAMUSCULAR | Status: AC | PRN
  Administered 2023-05-15: 30 mg via INTRAVENOUS

## 2023-05-15 MED ORDER — CEFAZOLIN SODIUM-DEXTROSE 2-4 GM/100ML-% IV SOLN: 2.0000 g | INTRAVENOUS | Status: DC

## 2023-05-15 MED ORDER — FENTANYL CITRATE (PF) 100 MCG/2ML IJ SOLN
INTRAMUSCULAR | Status: AC | PRN
  Administered 2023-05-15 (×3): 25 ug via INTRAVENOUS
  Administered 2023-05-15: 50 ug via INTRAVENOUS
  Administered 2023-05-15 (×3): 25 ug via INTRAVENOUS

## 2023-05-15 MED ORDER — KETOROLAC TROMETHAMINE 30 MG/ML IJ SOLN
INTRAMUSCULAR | Status: AC
  Filled 2023-05-15: qty 1

## 2023-05-15 MED ORDER — BUPIVACAINE HCL (PF) 0.25 % IJ SOLN
20.0000 mL | Freq: Once | INTRAMUSCULAR | Status: AC
  Administered 2023-05-15: 20 mL

## 2023-05-15 MED ORDER — IOHEXOL 300 MG/ML  SOLN
50.0000 mL | Freq: Once | INTRAMUSCULAR | Status: AC | PRN
  Administered 2023-05-15: 50 mL

## 2023-05-15 MED ORDER — BUPIVACAINE HCL (PF) 0.25 % IJ SOLN
INTRAMUSCULAR | Status: AC
  Filled 2023-05-15: qty 30

## 2023-05-15 NOTE — Procedures (Signed)
INTERVENTIONAL NEURORADIOLOGY BRIEF POSTPROCEDURE NOTE  FLUOROSCOPY GUIDED L1 CORE BIOPSY AND BALLOON KYPHOPLASTY  Attending: Dr. Baldemar Lenis  Diagnosis: L1 compression fracture  Access site: Percutaneous  Anesthesia: Moderate sedation  Medication used: 3.5 mg Versed IV; 200 mcg Fentanyl IV; 30 mg Toradol IV.  Complications: None  Estimated blood loss: Negligible  Specimen: 1 L1 core biopsy  Findings: Compression fracture of the L1 vertebral body superior endplate and sclerosis of the inferior endplate. Bilateral transpedicular approach utilized. One core biopsy sample was obtained, followed by balloon kyphoplasty.  The patient tolerated the procedure well without incident or complication and is in stable condition.

## 2023-05-18 LAB — SURGICAL PATHOLOGY

## 2023-05-19 ENCOUNTER — Telehealth (HOSPITAL_COMMUNITY): Payer: Self-pay

## 2023-05-19 NOTE — Telephone Encounter (Signed)
Called to check and see how she is doing, no answer, left vm. AB

## 2023-05-20 ENCOUNTER — Ambulatory Visit: Payer: Medicare PPO | Admitting: Rehabilitative and Restorative Service Providers"

## 2023-05-20 ENCOUNTER — Telehealth (HOSPITAL_COMMUNITY): Payer: Self-pay

## 2023-05-20 ENCOUNTER — Other Ambulatory Visit (HOSPITAL_COMMUNITY): Payer: Self-pay | Admitting: Neuroradiology

## 2023-05-20 ENCOUNTER — Encounter: Payer: Self-pay | Admitting: Rehabilitative and Restorative Service Providers"

## 2023-05-20 DIAGNOSIS — R293 Abnormal posture: Secondary | ICD-10-CM | POA: Diagnosis not present

## 2023-05-20 DIAGNOSIS — R262 Difficulty in walking, not elsewhere classified: Secondary | ICD-10-CM | POA: Diagnosis not present

## 2023-05-20 DIAGNOSIS — M5459 Other low back pain: Secondary | ICD-10-CM | POA: Diagnosis not present

## 2023-05-20 DIAGNOSIS — R2689 Other abnormalities of gait and mobility: Secondary | ICD-10-CM | POA: Diagnosis not present

## 2023-05-20 DIAGNOSIS — S32010A Wedge compression fracture of first lumbar vertebra, initial encounter for closed fracture: Secondary | ICD-10-CM

## 2023-05-20 NOTE — Therapy (Signed)
OUTPATIENT PHYSICAL THERAPY THORACOLUMBAR TREATMENT NOTE    Referring diagnosis?  Diagnosis  M54.9 (ICD-10-CM) - Back pain   Treatment diagnosis? (if different than referring diagnosis) R29.3   R26.2   R26.89   M54.59 What was this (referring dx) caused by? []  Surgery []  Fall [x]  Ongoing issue [x]  Arthritis [x]  Other: ______Compression fractures (3)______  Laterality: []  Rt []  Lt [x]  Both  Check all possible CPT codes:  *CHOOSE 10 OR LESS*    []  97110 (Therapeutic Exercise)  []  92507 (SLP Treatment)  []  97112 (Neuro Re-ed)   []  92526 (Swallowing Treatment)   []  97116 (Gait Training)   []  [id number] (Cognitive Training, 1st 15 minutes) []  97140 (Manual Therapy)   []  97130 (Cognitive Training, each add'l 15 minutes)  []  97164 (Re-evaluation)                              []  Other, List CPT Code ____________  []  40981 (Therapeutic Activities)     []  97535 (Self Care)   [x]  All codes above (97110 - 97535)  []  97012 (Mechanical Traction)  []  97014 (E-stim Unattended)  []  97032 (E-stim manual)  []  97033 (Ionto)  []  97035 (Ultrasound) []  97750 (Physical Performance Training) []  [id number] (Aquatic Therapy) []  97016 (Vasopneumatic Device) []  [id number] (Paraffin) []  97034 (Contrast Bath) []  97597 (Wound Care 1st 20 sq cm) []  97598 (Wound Care each add'l 20 sq cm) []  97760 (Orthotic Fabrication, Fitting, Training Initial) []  [id number] (Prosthetic Management and Training Initial) []  [id number] (Orthotic or Prosthetic Training/ Modification Subsequent)   Patient Name: Brenda Ortiz MRN: [id number] DOB:23-Jun-1941, 82 y.o., female Today's Date: 05/20/2023  Referring diagnosis?  Diagnosis  M54.9 (ICD-10-CM) - Back pain   END OF SESSION:  PT End of Session - 05/20/23 1352     Visit Number 15    Number of Visits 24    Date for PT Re-Evaluation 06/22/23    Authorization Type [employer]    Authorization - Visit Number 15    Authorization - Number of Visits 24    Progress Note Due on Visit 22     PT Start Time 1346    PT Stop Time 1428    PT Time Calculation (min) 42 min    Equipment Utilized During Treatment Gait belt    Activity Tolerance Patient tolerated treatment well;No increased pain;Patient limited by fatigue    Behavior During Therapy [facility] for tasks assessed/performed              Past Medical History:  Diagnosis Date   Arthritis    Herniated lumbar intervertebral disc 2022   Hypothyroidism    Incontinence    Seasonal allergies    UTI (urinary tract infection) 06/2021   Past Surgical History:  Procedure Laterality Date   DILATION AND CURETTAGE OF UTERUS     x2   FOOT SURGERY Right    HYSTEROSCOPY WITH D & C N/A 08/14/2016   Procedure: DILATATION AND CURETTAGE /HYSTEROSCOPY;  Surgeon: Olivia Mackie, MD;  Location: [facility];  Service: Gynecology;  Laterality: N/A;   IR KYPHO LUMBAR INC FX REDUCE BONE BX UNI/BIL CANNULATION INC/IMAGING  05/15/2023   JOINT REPLACEMENT Left 02/03/2020   LUMBAR LAMINECTOMY/ DECOMPRESSION WITH MET-RX Right 08/20/2021   Procedure: Right Thoracic 12-Lumbar 1 Minimally invasive discectomy with metrx;  Surgeon: Jadene Pierini, MD;  Location: [facility] OR;  Service: Neurosurgery;  Laterality: Right;  3C/RM 19   TONSILLECTOMY  TOTAL HIP ARTHROPLASTY Left 02/03/2020   Procedure: LEFT TOTAL HIP ARTHROPLASTY ANTERIOR APPROACH;  Surgeon: Kathryne Hitch, MD;  Location: [facility];  Service: Orthopedics;  Laterality: Left;   Patient Active Problem List   Diagnosis Date Noted   Lumbar radiculopathy 08/20/2021   Status post total replacement of left hip 02/03/2020   Unilateral primary osteoarthritis, left hip 12/01/2019   Low back pain 10/27/2019   Right hip pain 07/14/2017   Chest pain 06/11/2015   Hypothyroidism 06/11/2015    PCP: Chilton Greathouse, MD  REFERRING PROVIDER: Chilton Greathouse, MD   REFERRING DIAG:  Diagnosis  M54.9 (ICD-10-CM) - Back pain    Rationale for Evaluation and Treatment: Rehabilitation  THERAPY DIAG:   Abnormal posture  Difficulty in walking, not elsewhere classified  Other abnormalities of gait and mobility  Other low back pain  ONSET DATE: Chronic  SUBJECTIVE:                                                                                                                                                                                           SUBJECTIVE STATEMENT: Brenda Ortiz had her kyphoplasty Friday.  She has soreness and has not done much as far as exercises.  No significant pain changes thus far.  PERTINENT HISTORY:  Previous right foot surgery, lumbar decompression/laminectomy in 2022, left THA in 2021  PAIN:  Are you having pain? Yes: NPRS scale: 0-6/10 since her kyphoplasty on a 10/10 Pain location: Right > left low back pain Pain description: Achy, sore Aggravating factors: Standing, walking, weight-bearing Relieving factors: Sit, rest  PRECAUTIONS: Back and Fall  WEIGHT BEARING RESTRICTIONS: No  FALLS:  Has patient fallen in last 6 months? Yes. Number of falls 1, no injuries  LIVING ENVIRONMENT: Lives with: lives with their family and lives with their spouse Lives in: House/apartment Stairs:  Does OK in a 3 story house Has following equipment at home: Single point cane and Grab bars  OCCUPATION: Retired  PLOF: Independent with household mobility with device  PATIENT GOALS: Return to walking without the cane with improved standing and walking endurance  NEXT MD VISIT: January 2025  OBJECTIVE:   DIAGNOSTIC FINDINGS:  IMPRESSION: 1. T12-L1 severe right neural foraminal narrowing. 2. L5-S1 mild right neural foraminal narrowing.  PATIENT SURVEYS:  Eval FOTO 51 (Goal 63) in 11 visits  03/13/2023 FOTO 52  05/04/2023 FOTO 50  SCREENING FOR RED FLAGS: Bowel or bladder incontinence: No Spinal tumors: No Cauda equina syndrome: No Compression fracture: Yes: 3  COGNITION: Overall cognitive status: Within functional limits for tasks  assessed     SENSATION: No complaints of peripheral pains or  paresthesias.  Notes some left LE edema.  MUSCLE LENGTH: Hamstrings:  Thomas test:   POSTURE: rounded shoulders, forward head, and decreased lumbar lordosis   LUMBAR ROM:   AROM 02/05/2023 03/06/2023 05/04/2023  Flexion     Extension 10 10 15   Right lateral flexion     Left lateral flexion     Right rotation     Left rotation      (Blank rows = not tested)  LOWER EXTREMITY ROM:     Active  Left/Right 03/06/2023 Left/Right 05/04/2023  Hip flexion 100/100 105/100  Hip extension    Hip abduction    Hip adduction    Hip internal rotation 3/3 12/1  Hip external rotation 29/29 30/33  Hamstrings  40/40 40/45  Knee extension    Ankle dorsiflexion    Ankle plantarflexion    Ankle inversion    Ankle eversion     (Blank rows = not tested)  LOWER EXTREMITY STRENGTH:    MMT Right eval Left eval  Hip flexion    Hip extension    Hip abduction    Hip adduction    Hip internal rotation    Hip external rotation    Knee flexion    Knee extension    Ankle dorsiflexion    Ankle plantarflexion    Ankle inversion    Ankle eversion     (Blank rows = not tested)  05/04/2023 Berg 40/56  03/06/2023 Berg 36/56  02/12/2023 Berg 30/56   GAIT: Distance walked: In the clinic Assistive device utilized: Single point cane Level of assistance: Complete Independence Comments: Yakelin notes that she holds onto her husband's arm when she walks without her cane  TODAY'S TREATMENT:                                                                                                                              DATE:  05/20/2023 Lumbar extension AROM 10 x 3 seconds Shoulder blade pinches 5 x 5 seconds Alternating hip hike 10 for 3 seconds (emphasis on avoiding sliding the foot forward) Bridging 10 x 5 seconds Hamstrings stretch 4 x 20 seconds (other leg straight, supine) Figure 4 stretch supine 4 x 20 seconds  Functional  Activities:  Tap-up on 8 inch step 20X each without hands Dynamic drills- long strides with narrow stance Heel to toe raises with and without hands 10 x 3 seconds Tandem balance 4 x 20 seconds Slow marching 10 laps, tap opposite hand to encourage high knees and longer strides Turns with all dynamic activities without hands   05/13/2023 Lumbar extension AROM 5 x 3 seconds Shoulder blade pinches 5 x 5 seconds Alternating hip hike 2 sets of 10 for 3 seconds (emphasis on avoiding sliding the foot forward) Bridging 10 x 5 seconds Hamstrings stretch 4 x 20 seconds (other leg straight, supine) Figure 4 stretch supine 4 x 20 seconds  Functional Activities:  Tap-up on 8 inch step 20X each and  step-up and over 6 inch step 20X without hands Dynamic drills- long strides with narrow stance -Heel to toe raises without hands 10 x 3 seconds (HEP only) Tandem balance 4 x 20 seconds Slow marching 10 laps Turns with all dynamic activities without hands   05/06/2023 Lumbar extension AROM 10 x 3 seconds Shoulder blade pinches 10 x 5 seconds Alternating hip hike 2 sets of 10 for 3 seconds (emphasis on avoiding sliding the foot forward) Bridging 10 x 5 seconds Hamstrings stretch 4 x 20 seconds (other leg straight, supine) Figure 4 stretch supine 4 x 20 seconds  Functional Activities:  Tap-up on 8 inch step 20X each and step-up and over 6 inch step 20X Dynamic drills- long strides with narrow stance Heel to toe raises without hands 10 x 3 seconds Tandem balance 3 x 20 seconds Slow marching 10 laps Turns with all dynamic activities without hands   PATIENT EDUCATION:  Education details: Briefly reviewed home exercise program results and encouraged Kenzlee to show up on time for her appointment as she was 15 minutes late today Person educated: Patient Education method: Explanation, Demonstration, Tactile cues, Verbal cues, and Handouts Education comprehension: verbalized understanding, returned  demonstration, verbal cues required, tactile cues required, and needs further education  HOME EXERCISE PROGRAM: Access Code: [email or web address] URL: [email or web address] Date: 03/06/2023 Prepared by: Pauletta Browns  Exercises - Standing Lumbar Extension at Wall - Forearms  - 5 x daily - 7 x weekly - 1 sets - 5 reps - 3 seconds hold - Standing Scapular Retraction  - 5 x daily - 7 x weekly - 1 sets - 5 reps - 5 second hold - Heel Toe Raises with Counter Support  - 2 x daily - 7 x weekly - 1 sets - 20 reps - 3 seconds hold - Sit to Stand with Armchair  - 2 x daily - 7 x weekly - 1 sets - 5 reps - Tandem Stance  - 2 x daily - 7 x weekly - 1 sets - 5 reps - 20 second hold - Yoga Bridge  - 2 x daily - 7 x weekly - 1 sets - 10 reps - 5 seconds hold - Standing Hip Hiking  - 3-5 x daily - 7 x weekly - 1 sets - 10 reps - 3 seconds hold - Standing Row with Anchored Resistance Band with PLB  - 1-2 x daily - 1 x weekly - 1 sets - 20 reps - 3 seconds hold - Supine Hamstring Stretch  - 2 x daily - 7 x weekly - 1 sets - 5 reps - 20 seconds hold - Supine Figure 4 Piriformis Stretch  - 2 x daily - 7 x weekly - 1 sets - 5 reps - 20 seconds hold  ASSESSMENT:  CLINICAL IMPRESSION: Keryn reports that she had her procedure on Friday.  She is still sore and was a bit disappointed she has not noticed any relief yet.  I encouraged Janasha to focus on her spine and postural strength work along with balance activities to do what she can do to control her symptoms, improve her balance and reduce her risk of future falls.  Physical therapy in the office and at home will continue to focus on back and postural strength, balance and fall reduction activities to meet long-term goals.   OBJECTIVE IMPAIRMENTS: Abnormal gait, decreased activity tolerance, decreased balance, decreased endurance, decreased knowledge of condition, difficulty walking, decreased ROM, decreased strength, decreased safety awareness, impaired  perceived functional ability, improper  body mechanics, postural dysfunction, and pain.   ACTIVITY LIMITATIONS: carrying, lifting, bending, sitting, standing, squatting, and locomotion level  PARTICIPATION LIMITATIONS: meal prep, cleaning, and community activity  PERSONAL FACTORS: Previous right foot surgery, lumbar decompression/laminectomy in 2022, left THA in 2021 are also affecting patient's functional outcome.   REHAB POTENTIAL: Good  CLINICAL DECISION MAKING: Stable/uncomplicated  EVALUATION COMPLEXITY: Low   GOALS: Goals reviewed with patient? Yes  SHORT TERM GOALS: Target date: 03/05/2023  Naryiah will be independent with her day 1 home exercise program Baseline: Started 02/05/2023 Goal status: Met 03/06/2023  2.  Establish a baseline Berg balance score Baseline: Deferred today secondary to Jamyia being 15 minutes late for her appointment Goal status: Met 02/12/2023   LONG TERM GOALS: Target date: 06/22/2023  Improve FOTO to 63 in 11 visits Baseline: 51 Goal status: On Going 05/04/2023  2.  Jazzmine will report low back pain consistently 0-3 out of 10 on the numeric pain rating scale Baseline: 0-5 out of 10 Goal status: On Going 05/20/2023  3.  Improve Berg balance scale scores to allow Briuna to be independent and safe with an appropriate assistive device at discharge Baseline: Will be assessed visit 2 secondary to this patient arriving 15 minutes late at evaluation Goal status: Partially Met 05/04/2023  4.  Jacobi will be able to demonstrate appropriate sitting and standing postures to help avoid increases in low back pain Baseline: Education will be started visit 2 Goal status: Partially Met (inconsistent) 05/20/2023  5.  Tanvir will be independent with her long-term home exercise program at discharge Baseline: Started 02/05/2023 Goal status: On Going 05/20/2023   PLAN:  PT FREQUENCY: 1-2x/week  PT DURATION: 6 weeks  PLANNED INTERVENTIONS: Therapeutic  exercises, Therapeutic activity, Neuromuscular re-education, Balance training, Gait training, Patient/Family education, Self Care, Stair training, Vestibular training, Canalith repositioning, Dry Needling, Spinal mobilization, Cryotherapy, and Manual therapy.  PLAN FOR NEXT SESSION: Work on low back strength and balance to reduce future falls risk with emphasis on activities deficient at last [facility] 05/04/2023.  Cherlyn Cushing, PT, MPT 05/20/2023, 4:15 PM

## 2023-05-20 NOTE — Telephone Encounter (Signed)
Called to schedule f/u, no answer, left vm. AB  

## 2023-05-25 ENCOUNTER — Ambulatory Visit (HOSPITAL_COMMUNITY)
Admission: RE | Admit: 2023-05-25 | Discharge: 2023-05-25 | Disposition: A | Discharge status: Home / Self Care | Payer: Medicare PPO | Source: Ambulatory Visit | Attending: Neuroradiology | Admitting: Neuroradiology

## 2023-05-25 DIAGNOSIS — S32010A Wedge compression fracture of first lumbar vertebra, initial encounter for closed fracture: Secondary | ICD-10-CM

## 2023-05-25 NOTE — Progress Notes (Signed)
Referring Physician(s): de Macedo Rodrigues,Ivonna Kinnick  Chief Complaint: The patient is seen in follow up today s/p L1 kyphoplasty  History of present illness:  Brenda Ortiz is a 82 year old female with a past medical history of osteoporosis, arthritis, hypothyroidism, prior lumbar laminectomy and left hip replacement. She had acute onset low back pain approximately 6 months ago. At that time, an MRI of the thoracic spine revealed an acute compression fracture of the L1 vertebral body.  More recently, an MRI of the lumbar spine performed in May showed persistent marrow edema.  Given intractable back pain and persistent marrow edema, decision was made to proceed with L1 balloon kyphoplasty.  The procedure was performed on 05/15/2023 and was uneventful.  She returns today for follow-up.    Unfortunately, the pain has not improved.  She said it was slightly worse immediately after the procedure but that it now at its baseline.  Pain interferes with her activities of daily life and has affected her overall wellbeing.   Past Medical History:  Diagnosis Date   Arthritis    Herniated lumbar intervertebral disc 2022   Hypothyroidism    Incontinence    Seasonal allergies    UTI (urinary tract infection) 06/2021    Past Surgical History:  Procedure Laterality Date   DILATION AND CURETTAGE OF UTERUS     x2   FOOT SURGERY Right    HYSTEROSCOPY WITH D & C N/A 08/14/2016   Procedure: DILATATION AND CURETTAGE /HYSTEROSCOPY;  Surgeon: Olivia Mackie, MD;  Location: [facility];  Service: Gynecology;  Laterality: N/A;   IR KYPHO LUMBAR INC FX REDUCE BONE BX UNI/BIL CANNULATION INC/IMAGING  05/15/2023   JOINT REPLACEMENT Left 02/03/2020   LUMBAR LAMINECTOMY/ DECOMPRESSION WITH MET-RX Right 08/20/2021   Procedure: Right Thoracic 12-Lumbar 1 Minimally invasive discectomy with metrx;  Surgeon: Jadene Pierini, MD;  [facility] OR;  Service: Neurosurgery;  Laterality: Right;  3C/RM 19    TONSILLECTOMY     TOTAL HIP ARTHROPLASTY Left 02/03/2020   Procedure: LEFT TOTAL HIP ARTHROPLASTY ANTERIOR APPROACH;  Surgeon: Kathryne Hitch, MD;  Location: [facility];  Service: Orthopedics;  Laterality: Left;    Allergies: Patient has no known allergies.  Medications: Prior to Admission medications   Medication Sig Start Date End Date Taking? Authorizing Provider  acetaminophen-codeine (TYLENOL #3) 300-30 MG tablet Take 1-2 tablets by mouth every 8 (eight) hours as needed. 08/21/21   Jadene Pierini, MD  calcium-vitamin D (OSCAL) 250-125 MG-UNIT per tablet Take 1 tablet by mouth at bedtime.     [provider]  fexofenadine (ALLEGRA) 180 MG tablet Take 180 mg by mouth daily as needed for allergies or rhinitis.    [provider]  Multiple Vitamin (MULTIVITAMIN WITH MINERALS) TABS tablet Take 1 tablet by mouth at bedtime.     [provider]  MYRBETRIQ 50 MG TB24 tablet Take 50 mg by mouth daily. 09/20/20   [provider]  olmesartan (BENICAR) 20 MG tablet Take 20 mg by mouth daily.    [provider]  PREMPRO 0.625-2.5 MG per tablet Take 1 tablet by mouth at bedtime.  08/28/13   [provider]  SYNTHROID 75 MCG tablet Take 75 mcg by mouth daily before breakfast.  09/12/13   [provider]     Family History  Problem Relation Age of Onset   Arthritis Mother    Arthritis Father    Diabetes Father    Diabetes Brother    Diabetes Paternal Grandmother  Social History   Socioeconomic History   Marital status: Married    Spouse name: Not on file   Number of children: Not on file   Years of education: Not on file   Highest education level: Not on file  Occupational History   Not on file  Tobacco Use   Smoking status: Former    Packs/day: 0.50    Years: 10.00    Additional pack years: 0.00    Total pack years: 5.00    Types: Cigarettes    Quit date: 11/17/1970    Years since quitting: 52.5   Smokeless  tobacco: Never  Vaping Use   Vaping Use: Never used  Substance and Sexual Activity   Alcohol use: Yes    Alcohol/week: 0.0 standard drinks of alcohol    Comment: 2-3 times a week   Drug use: No   Sexual activity: Yes    Birth control/protection: Post-menopausal  Other Topics Concern   Not on file  Social History Narrative   Not on file   Social Determinants of Health   Financial Resource Strain: Not on file  Food Insecurity: Not on file  Transportation Needs: Not on file  Physical Activity: Not on file  Stress: Not on file  Social Connections: Not on file     Vital Signs: There were no vitals taken for this visit.  Physical Exam Constitutional:      Appearance: Normal appearance.  Musculoskeletal:        General: Tenderness present.     Comments: Lumbar pain.  Neurological:     General: No focal deficit present.     Mental Status: She is alert and oriented to person, place, and time.  Psychiatric:        Mood and Affect: Mood is depressed.        Behavior: Behavior normal.     Imaging: No results found.  Labs:  CBC: Recent Labs    05/15/23 0736  WBC 6.4  HGB 13.3  HCT 40.1  PLT 244    COAGS: No results for input(s): "INR", "APTT" in the last 8760 hours.  BMP: Recent Labs    05/15/23 0736  NA 138  K 3.7  CL 100  CO2 26  GLUCOSE 105*  BUN 17  CALCIUM 9.4  CREATININE 0.94  GFRNONAA >60    LIVER FUNCTION TESTS: No results for input(s): "BILITOT", "AST", "ALT", "ALKPHOS", "PROT", "ALBUMIN" in the last 8760 hours.  Assessment:  Brenda Ortiz is a 82 year old female with intractable back pain status post L1 kyphoplasty.  Unfortunately, she has had no improvement of her back pain.  I instructed her to follow-up with per her spine surgeon to explore other pain management options, such as physical therapy, epidural spinal injection or other pain medication.  No new restrictions to her mobility related to the kyphoplasty.     Signed: Baldemar Lenis, MD 05/25/2023, 3:55 PM    I spent a total of    25 Minutes in face to face in clinical consultation, greater than 50% of which was counseling/coordinating care for back pain status post kyphoplasty.

## 2023-06-03 ENCOUNTER — Ambulatory Visit: Payer: Medicare PPO | Admitting: Rehabilitative and Restorative Service Providers"

## 2023-06-03 ENCOUNTER — Encounter: Payer: Self-pay | Admitting: Rehabilitative and Restorative Service Providers"

## 2023-06-03 DIAGNOSIS — R2689 Other abnormalities of gait and mobility: Secondary | ICD-10-CM | POA: Diagnosis not present

## 2023-06-03 DIAGNOSIS — R262 Difficulty in walking, not elsewhere classified: Secondary | ICD-10-CM

## 2023-06-03 DIAGNOSIS — R293 Abnormal posture: Secondary | ICD-10-CM | POA: Diagnosis not present

## 2023-06-03 DIAGNOSIS — M5459 Other low back pain: Secondary | ICD-10-CM | POA: Diagnosis not present

## 2023-06-03 NOTE — Therapy (Addendum)
OUTPATIENT PHYSICAL THERAPY THORACOLUMBAR TREATMENT NOTE  / DISCHARGE     Patient Name: Brenda Ortiz MRN: [id number] DOB:1941-04-19, 82 y.o., female Today's Date: 06/03/2023  Referring diagnosis?  Diagnosis  M54.9 (ICD-10-CM) - Back pain   END OF SESSION:  PT End of Session - 06/03/23 1514     Visit Number 16    Number of Visits 24    Date for PT Re-Evaluation 06/22/23    Authorization Type [employer]    Authorization - Visit Number 16    Authorization - Number of Visits 24    Progress Note Due on Visit 22    PT Start Time 1512    PT Stop Time 1600    PT Time Calculation (min) 48 min    Equipment Utilized During Treatment Gait belt    Activity Tolerance Patient tolerated treatment well;No increased pain    Behavior During Therapy [facility] for tasks assessed/performed               Past Medical History:  Diagnosis Date   Arthritis    Herniated lumbar intervertebral disc 2022   Hypothyroidism    Incontinence    Seasonal allergies    UTI (urinary tract infection) 06/2021   Past Surgical History:  Procedure Laterality Date   DILATION AND CURETTAGE OF UTERUS     x2   FOOT SURGERY Right    HYSTEROSCOPY WITH D & C N/A 08/14/2016   Procedure: DILATATION AND CURETTAGE /HYSTEROSCOPY;  Surgeon: Brenda Mackie, MD;  Location: [facility];  Service: Gynecology;  Laterality: N/A;   IR KYPHO LUMBAR INC FX REDUCE BONE BX UNI/BIL CANNULATION INC/IMAGING  05/15/2023   JOINT REPLACEMENT Left 02/03/2020   LUMBAR LAMINECTOMY/ DECOMPRESSION WITH MET-RX Right 08/20/2021   Procedure: Right Thoracic 12-Lumbar 1 Minimally invasive discectomy with metrx;  Surgeon: Brenda Pierini, MD;  Location: [facility] OR;  Service: Neurosurgery;  Laterality: Right;  3C/RM 19   TONSILLECTOMY     TOTAL HIP ARTHROPLASTY Left 02/03/2020   Procedure: LEFT TOTAL HIP ARTHROPLASTY ANTERIOR APPROACH;  Surgeon: Brenda Hitch, MD;  Location: [facility];  Service: Orthopedics;  Laterality: Left;   Patient Active  Problem List   Diagnosis Date Noted   Lumbar radiculopathy 08/20/2021   Status post total replacement of left hip 02/03/2020   Unilateral primary osteoarthritis, left hip 12/01/2019   Low back pain 10/27/2019   Right hip pain 07/14/2017   Chest pain 06/11/2015   Hypothyroidism 06/11/2015    PCP: Brenda Greathouse, MD  REFERRING PROVIDER: Chilton Greathouse, MD   REFERRING DIAG:  Diagnosis  M54.9 (ICD-10-CM) - Back pain    Rationale for Evaluation and Treatment: Rehabilitation  THERAPY DIAG:  Abnormal posture  Difficulty in walking, not elsewhere classified  Other abnormalities of gait and mobility  Other low back pain  ONSET DATE: Chronic  SUBJECTIVE:  SUBJECTIVE STATEMENT: [name] had her kyphoplasty 2.5 weeks ago on a Friday.  She notes no significant pain changes thus far.  Exercise compliance has been good over the last week.  PERTINENT HISTORY:  Previous right foot surgery, lumbar decompression/laminectomy in 2022, left THA in 2021  PAIN:  Are you having pain? Yes: NPRS scale: 0-6/10 since her kyphoplasty on a 10/10 Pain location: Right > left low back pain Pain description: Achy, sore Aggravating factors: Standing, walking, weight-bearing Relieving factors: Sit, rest  PRECAUTIONS: Back and Fall  WEIGHT BEARING RESTRICTIONS: No  FALLS:  Has patient fallen in last 6 months? Yes. Number of falls 1, no injuries  LIVING ENVIRONMENT: Lives with: lives with their family and lives with their spouse Lives in: House/apartment Stairs:  Does OK in a 3 story house Has following equipment at home: Single point cane and Grab bars  OCCUPATION: Retired  PLOF: Independent with household mobility with device  PATIENT GOALS: Return to walking without the cane with improved standing and  walking endurance  NEXT MD VISIT: January 2025  OBJECTIVE:   DIAGNOSTIC FINDINGS:  IMPRESSION: 1. T12-L1 severe right neural foraminal narrowing. 2. L5-S1 mild right neural foraminal narrowing.  PATIENT SURVEYS:  Eval FOTO 51 (Goal 63) in 11 visits  03/13/2023 FOTO 52  05/04/2023 FOTO 50  SCREENING FOR RED FLAGS: Bowel or bladder incontinence: No Spinal tumors: No Cauda equina syndrome: No Compression fracture: Yes: 3  COGNITION: Overall cognitive status: Within functional limits for tasks assessed     SENSATION: No complaints of peripheral pains or paresthesias.  Notes some left LE edema.  MUSCLE LENGTH: Hamstrings:  Thomas test:   POSTURE: rounded shoulders, forward head, and decreased lumbar lordosis   LUMBAR ROM:   AROM 02/05/2023 03/06/2023 05/04/2023  Flexion     Extension 10 10 15   Right lateral flexion     Left lateral flexion     Right rotation     Left rotation      (Blank rows = not tested)  LOWER EXTREMITY ROM:     Active  Left/Right 03/06/2023 Left/Right 05/04/2023  Hip flexion 100/100 105/100  Hip extension    Hip abduction    Hip adduction    Hip internal rotation 3/3 12/1  Hip external rotation 29/29 30/33  Hamstrings  40/40 40/45  Knee extension    Ankle dorsiflexion    Ankle plantarflexion    Ankle inversion    Ankle eversion     (Blank rows = not tested)  LOWER EXTREMITY STRENGTH:    MMT Right eval Left eval  Hip flexion    Hip extension    Hip abduction    Hip adduction    Hip internal rotation    Hip external rotation    Knee flexion    Knee extension    Ankle dorsiflexion    Ankle plantarflexion    Ankle inversion    Ankle eversion     (Blank rows = not tested)  05/04/2023 Berg 40/56  03/06/2023 Berg 36/56  02/12/2023 Berg 30/56   GAIT: Distance walked: In the clinic Assistive device utilized: Single point cane Level of assistance: Complete Independence Comments: [name] notes that she holds onto her husband's  arm when she walks without her cane  TODAY'S TREATMENT:  DATE:  06/03/2023 Lumbar extension AROM 10 x 3 seconds Shoulder blade pinches 5 x 5 seconds Alternating hip hike 2 sets of 10 for 3 seconds (emphasis on avoiding sliding the foot forward) Bridging 10 x 5 seconds Hamstrings stretch 4 x 20 seconds (other leg straight, supine) Figure 4 stretch supine 4 x 20 seconds  Functional Activities:  Tap-up on 8 inch step 20X each without hands Dynamic drills- long strides with narrow stance Heel to toe raises with and without hands 10 x 3 seconds each Tandem balance 2 x 20 seconds Slow marching 2 sets of 10 steps, tap opposite hand to encourage high knees and longer strides All dynamic activities without hands   05/20/2023 Lumbar extension AROM 10 x 3 seconds Shoulder blade pinches 5 x 5 seconds Alternating hip hike 10 for 3 seconds (emphasis on avoiding sliding the foot forward) Bridging 10 x 5 seconds Hamstrings stretch 4 x 20 seconds (other leg straight, supine) Figure 4 stretch supine 4 x 20 seconds  Functional Activities:  Tap-up on 8 inch step 20X each without hands Dynamic drills- long strides with narrow stance Heel to toe raises with and without hands 10 x 3 seconds Tandem balance 4 x 20 seconds Slow marching 10 laps, tap opposite hand to encourage high knees and longer strides Turns with all dynamic activities without hands   05/13/2023 Lumbar extension AROM 5 x 3 seconds Shoulder blade pinches 5 x 5 seconds Alternating hip hike 2 sets of 10 for 3 seconds (emphasis on avoiding sliding the foot forward) Bridging 10 x 5 seconds Hamstrings stretch 4 x 20 seconds (other leg straight, supine) Figure 4 stretch supine 4 x 20 seconds  Functional Activities:  Tap-up on 8 inch step 20X each and step-up and over 6 inch step 20X without hands Dynamic drills-  long strides with narrow stance -Heel to toe raises without hands 10 x 3 seconds (HEP only) Tandem balance 4 x 20 seconds Slow marching 10 laps Turns with all dynamic activities without hands   PATIENT EDUCATION:  Education details: Briefly reviewed home exercise program results and encouraged Dedie to show up on time for her appointment as she was 15 minutes late today Person educated: Patient Education method: Explanation, Demonstration, Tactile cues, Verbal cues, and Handouts Education comprehension: verbalized understanding, returned demonstration, verbal cues required, tactile cues required, and needs further education  HOME EXERCISE PROGRAM: Access Code: [email or web address] URL: [email or web address] Date: 03/06/2023 Prepared by: Pauletta Browns  Exercises - Standing Lumbar Extension at Wall - Forearms  - 5 x daily - 7 x weekly - 1 sets - 5 reps - 3 seconds hold - Standing Scapular Retraction  - 5 x daily - 7 x weekly - 1 sets - 5 reps - 5 second hold - Heel Toe Raises with Counter Support  - 2 x daily - 7 x weekly - 1 sets - 20 reps - 3 seconds hold - Sit to Stand with Armchair  - 2 x daily - 7 x weekly - 1 sets - 5 reps - Tandem Stance  - 2 x daily - 7 x weekly - 1 sets - 5 reps - 20 second hold - Yoga Bridge  - 2 x daily - 7 x weekly - 1 sets - 10 reps - 5 seconds hold - Standing Hip Hiking  - 3-5 x daily - 7 x weekly - 1 sets - 10 reps - 3 seconds hold - Standing Row with Anchored Resistance Band with PLB  - 1-2 x  daily - 1 x weekly - 1 sets - 20 reps - 3 seconds hold - Supine Hamstring Stretch  - 2 x daily - 7 x weekly - 1 sets - 5 reps - 20 seconds hold - Supine Figure 4 Piriformis Stretch  - 2 x daily - 7 x weekly - 1 sets - 5 reps - 20 seconds hold  ASSESSMENT:  CLINICAL IMPRESSION: Jasiri reports no symptom changes since her procedure 2.5 weeks ago on Friday.  Treniece reports good compliance with her spine and postural strength work, although hip hiking  compliance can continue.  Balance looked better today as compared to her last visit as she did not require hands assistance with change of position or any dynamic balance activities.  Physical therapy will continue to focus on back and postural strength, balance and fall reduction activities to meet long-term goals.   OBJECTIVE IMPAIRMENTS: Abnormal gait, decreased activity tolerance, decreased balance, decreased endurance, decreased knowledge of condition, difficulty walking, decreased ROM, decreased strength, decreased safety awareness, impaired perceived functional ability, improper body mechanics, postural dysfunction, and pain.   ACTIVITY LIMITATIONS: carrying, lifting, bending, sitting, standing, squatting, and locomotion level  PARTICIPATION LIMITATIONS: meal prep, cleaning, and community activity  PERSONAL FACTORS: Previous right foot surgery, lumbar decompression/laminectomy in 2022, left THA in 2021 are also affecting patient's functional outcome.   REHAB POTENTIAL: Good  CLINICAL DECISION MAKING: Stable/uncomplicated  EVALUATION COMPLEXITY: Low   GOALS: Goals reviewed with patient? Yes  SHORT TERM GOALS: Target date: 03/05/2023  Nuriya will be independent with her day 1 home exercise program Baseline: Started 02/05/2023 Goal status: Met 03/06/2023  2.  Establish a baseline Berg balance score Baseline: Deferred today secondary to Zymeria being 15 minutes late for her appointment Goal status: Met 02/12/2023   LONG TERM GOALS: Target date: 06/22/2023  Improve FOTO to 63 in 11 visits Baseline: 51 Goal status: On Going 05/04/2023  2.  Zuria will report low back pain consistently 0-3 out of 10 on the numeric pain rating scale Baseline: 0-5 out of 10 Goal status: On Going 06/03/2023  3.  Improve Berg balance scale scores to allow Makensi to be independent and safe with an appropriate assistive device at discharge Baseline: Will be assessed visit 2 secondary to this  patient arriving 15 minutes late at evaluation Goal status: Partially Met 05/04/2023  4.  [place] will be able to demonstrate appropriate sitting and standing postures to help avoid increases in low back pain Baseline: Education will be started visit 2 Goal status: Partially Met (inconsistent) 06/03/2023  5.  Clarabella will be independent with her long-term home exercise program at discharge Baseline: Started 02/05/2023 Goal status: On Going 06/03/2023   PLAN:  PT FREQUENCY: 1-2x/week  PT DURATION: 6 weeks  PLANNED INTERVENTIONS: Therapeutic exercises, Therapeutic activity, Neuromuscular re-education, Balance training, Gait training, Patient/Family education, Self Care, Stair training, Vestibular training, Canalith repositioning, Dry Needling, Spinal mobilization, Cryotherapy, and Manual therapy.  PLAN FOR NEXT SESSION: Continue to work on low back strength and balance to reduce future falls risk with emphasis on activities deficient at last [facility] 05/04/2023.  Cherlyn Cushing, PT, MPT 06/03/2023, 4:03 PM     PHYSICAL THERAPY DISCHARGE SUMMARY  Visits from Start of Care: 16  Current functional level related to goals / functional outcomes: See note   Remaining deficits: See note   Education / Equipment: HEP  Patient goals were partially met. Patient is being discharged due to not returning since the last visit.  Chyrel Masson, PT, DPT,  OCS, ATC 07/06/23  11:31 AM

## 2023-06-10 ENCOUNTER — Encounter: Payer: Medicare PPO | Admitting: Rehabilitative and Restorative Service Providers"

## 2023-07-01 DIAGNOSIS — M47814 Spondylosis without myelopathy or radiculopathy, thoracic region: Secondary | ICD-10-CM | POA: Diagnosis not present

## 2023-07-15 DIAGNOSIS — M47814 Spondylosis without myelopathy or radiculopathy, thoracic region: Secondary | ICD-10-CM | POA: Diagnosis not present

## 2023-07-15 DIAGNOSIS — M6281 Muscle weakness (generalized): Secondary | ICD-10-CM | POA: Diagnosis not present

## 2023-07-16 DIAGNOSIS — M47814 Spondylosis without myelopathy or radiculopathy, thoracic region: Secondary | ICD-10-CM | POA: Diagnosis not present

## 2023-07-27 DIAGNOSIS — M47814 Spondylosis without myelopathy or radiculopathy, thoracic region: Secondary | ICD-10-CM | POA: Diagnosis not present

## 2023-07-27 DIAGNOSIS — M6281 Muscle weakness (generalized): Secondary | ICD-10-CM | POA: Diagnosis not present

## 2023-08-03 DIAGNOSIS — M47814 Spondylosis without myelopathy or radiculopathy, thoracic region: Secondary | ICD-10-CM | POA: Diagnosis not present

## 2023-08-03 DIAGNOSIS — M6281 Muscle weakness (generalized): Secondary | ICD-10-CM | POA: Diagnosis not present

## 2023-08-07 DIAGNOSIS — M47816 Spondylosis without myelopathy or radiculopathy, lumbar region: Secondary | ICD-10-CM | POA: Diagnosis not present

## 2023-08-17 DIAGNOSIS — M47814 Spondylosis without myelopathy or radiculopathy, thoracic region: Secondary | ICD-10-CM | POA: Diagnosis not present

## 2023-08-17 DIAGNOSIS — M6281 Muscle weakness (generalized): Secondary | ICD-10-CM | POA: Diagnosis not present

## 2023-08-21 DIAGNOSIS — M47816 Spondylosis without myelopathy or radiculopathy, lumbar region: Secondary | ICD-10-CM | POA: Diagnosis not present

## 2023-08-24 DIAGNOSIS — M47814 Spondylosis without myelopathy or radiculopathy, thoracic region: Secondary | ICD-10-CM | POA: Diagnosis not present

## 2023-08-24 DIAGNOSIS — M6281 Muscle weakness (generalized): Secondary | ICD-10-CM | POA: Diagnosis not present

## 2023-09-10 DIAGNOSIS — M47816 Spondylosis without myelopathy or radiculopathy, lumbar region: Secondary | ICD-10-CM | POA: Diagnosis not present

## 2023-09-12 DIAGNOSIS — Z23 Encounter for immunization: Secondary | ICD-10-CM | POA: Diagnosis not present

## 2023-09-30 DIAGNOSIS — M47816 Spondylosis without myelopathy or radiculopathy, lumbar region: Secondary | ICD-10-CM | POA: Diagnosis not present

## 2023-11-30 DIAGNOSIS — M47816 Spondylosis without myelopathy or radiculopathy, lumbar region: Secondary | ICD-10-CM | POA: Diagnosis not present

## 2023-12-09 DIAGNOSIS — R7989 Other specified abnormal findings of blood chemistry: Secondary | ICD-10-CM | POA: Diagnosis not present

## 2023-12-09 DIAGNOSIS — E039 Hypothyroidism, unspecified: Secondary | ICD-10-CM | POA: Diagnosis not present

## 2023-12-09 DIAGNOSIS — E559 Vitamin D deficiency, unspecified: Secondary | ICD-10-CM | POA: Diagnosis not present

## 2023-12-09 LAB — LAB REPORT - SCANNED
EGFR: 69
Free T4: 0.9 ng/dL
TSH: 1.48 (ref 0.41–5.90)

## 2023-12-14 DIAGNOSIS — R82998 Other abnormal findings in urine: Secondary | ICD-10-CM | POA: Diagnosis not present

## 2023-12-14 DIAGNOSIS — E039 Hypothyroidism, unspecified: Secondary | ICD-10-CM | POA: Diagnosis not present

## 2023-12-14 DIAGNOSIS — M810 Age-related osteoporosis without current pathological fracture: Secondary | ICD-10-CM | POA: Diagnosis not present

## 2023-12-14 DIAGNOSIS — Z79890 Hormone replacement therapy (postmenopausal): Secondary | ICD-10-CM | POA: Diagnosis not present

## 2023-12-14 DIAGNOSIS — R030 Elevated blood-pressure reading, without diagnosis of hypertension: Secondary | ICD-10-CM | POA: Diagnosis not present

## 2023-12-14 DIAGNOSIS — E559 Vitamin D deficiency, unspecified: Secondary | ICD-10-CM | POA: Diagnosis not present

## 2023-12-14 DIAGNOSIS — Z1339 Encounter for screening examination for other mental health and behavioral disorders: Secondary | ICD-10-CM | POA: Diagnosis not present

## 2023-12-14 DIAGNOSIS — Z1331 Encounter for screening for depression: Secondary | ICD-10-CM | POA: Diagnosis not present

## 2023-12-14 DIAGNOSIS — Z0000 Encounter for general adult medical examination without abnormal findings: Secondary | ICD-10-CM | POA: Diagnosis not present

## 2023-12-14 DIAGNOSIS — F4329 Adjustment disorder with other symptoms: Secondary | ICD-10-CM | POA: Diagnosis not present

## 2023-12-14 DIAGNOSIS — M48062 Spinal stenosis, lumbar region with neurogenic claudication: Secondary | ICD-10-CM | POA: Diagnosis not present

## 2023-12-14 DIAGNOSIS — R269 Unspecified abnormalities of gait and mobility: Secondary | ICD-10-CM | POA: Diagnosis not present

## 2023-12-14 DIAGNOSIS — S32010D Wedge compression fracture of first lumbar vertebra, subsequent encounter for fracture with routine healing: Secondary | ICD-10-CM | POA: Diagnosis not present

## 2023-12-15 DIAGNOSIS — H43813 Vitreous degeneration, bilateral: Secondary | ICD-10-CM | POA: Diagnosis not present

## 2023-12-21 DIAGNOSIS — R269 Unspecified abnormalities of gait and mobility: Secondary | ICD-10-CM | POA: Diagnosis not present

## 2023-12-24 DIAGNOSIS — Z124 Encounter for screening for malignant neoplasm of cervix: Secondary | ICD-10-CM | POA: Diagnosis not present

## 2023-12-24 DIAGNOSIS — N951 Menopausal and female climacteric states: Secondary | ICD-10-CM | POA: Diagnosis not present

## 2023-12-24 DIAGNOSIS — R269 Unspecified abnormalities of gait and mobility: Secondary | ICD-10-CM | POA: Diagnosis not present

## 2023-12-24 DIAGNOSIS — Z79890 Hormone replacement therapy (postmenopausal): Secondary | ICD-10-CM | POA: Diagnosis not present

## 2023-12-24 DIAGNOSIS — Z1331 Encounter for screening for depression: Secondary | ICD-10-CM | POA: Diagnosis not present

## 2023-12-28 DIAGNOSIS — M47816 Spondylosis without myelopathy or radiculopathy, lumbar region: Secondary | ICD-10-CM | POA: Diagnosis not present

## 2023-12-29 DIAGNOSIS — M47816 Spondylosis without myelopathy or radiculopathy, lumbar region: Secondary | ICD-10-CM | POA: Diagnosis not present

## 2023-12-29 DIAGNOSIS — Z6825 Body mass index (BMI) 25.0-25.9, adult: Secondary | ICD-10-CM | POA: Diagnosis not present

## 2023-12-29 DIAGNOSIS — S32010D Wedge compression fracture of first lumbar vertebra, subsequent encounter for fracture with routine healing: Secondary | ICD-10-CM | POA: Diagnosis not present

## 2023-12-30 DIAGNOSIS — Z1231 Encounter for screening mammogram for malignant neoplasm of breast: Secondary | ICD-10-CM | POA: Diagnosis not present

## 2023-12-31 DIAGNOSIS — R269 Unspecified abnormalities of gait and mobility: Secondary | ICD-10-CM | POA: Diagnosis not present

## 2024-01-04 DIAGNOSIS — R269 Unspecified abnormalities of gait and mobility: Secondary | ICD-10-CM | POA: Diagnosis not present

## 2024-01-07 ENCOUNTER — Ambulatory Visit: Payer: Medicare PPO | Admitting: Sports Medicine

## 2024-01-07 VITALS — BP 146/70 | Ht 67.0 in | Wt 150.0 lb

## 2024-01-07 DIAGNOSIS — M810 Age-related osteoporosis without current pathological fracture: Secondary | ICD-10-CM | POA: Diagnosis not present

## 2024-01-07 DIAGNOSIS — R269 Unspecified abnormalities of gait and mobility: Secondary | ICD-10-CM

## 2024-01-07 DIAGNOSIS — Z9181 History of falling: Secondary | ICD-10-CM

## 2024-01-07 NOTE — Progress Notes (Signed)
PCP: Chilton Greathouse, MD  SUBJECTIVE:   HPI:  Patient is a 83 y.o. female here with chief complaint of low back pain.  She was referred over by Dr. Yetta Barre, who saw her after her previous NSGY physician moved out of state. I have reviewed her most recent visit with him on 12/29/23. Her history includes a previous left THA in 2021 and lumbar decompression/laminectomy in 2022 with Dr. Maurice Small. Appears she has had two compression fractures in her lumbar spine in the past year, one at L1 s/p kyphoplasty in 04/2023 and another at L4 that was discovered in her most recent visit w/ Dr. Yetta Barre though appeared to have healed by that time.  She has known osteoporosis and was previously on Tymlos for this, though discontinued this about a year ago. Is currently only on Ca and Vit D. She is a former smoker, quit in her 58s. Has ~1 glass of wine/d. Hx of previous fragility fractures. No hx of autoimmune disease or diabetes.  She recently started PT for balance training as she is high risk for falls, especially in light of her osteoporosis. She is also in piliates. She's been ambulating with a cain for the past 3-4 months. Her back pain is currently not bad, rates this a 2-3/10 and most uncomfortable while walking. Locates to the right side. Is getting periodic facet joint injections, most recently 2 weeks ago and has been getting good relief with these.   Pertinent ROS were reviewed with the patient and found to be negative unless otherwise specified above in HPI.   PERTINENT  PMH / PSH / FH / SH:  Past Medical, Surgical, Social, and Family History Reviewed & Updated in the EMR.  Pertinent findings include:  See HPI  Her mother died after a head injury from a fall during osteoporotic fracture of hip?  No Known Allergies  OBJECTIVE:  BP (!) 146/70   Ht 5\' 7"  (1.702 m)   Wt 150 lb (68 kg)   BMI 23.49 kg/m   PHYSICAL EXAM:  GEN: Alert and Oriented, NAD, comfortable in exam room RESP: Unlabored  respirations, symmetric chest rise PSY: normal mood, congruent affect   MSK EXAM: Scar noted over lumbar spine. She is TTP along right paraspinal m.  R hip with 3/5 hip flexion and abduction, L hip 4/5 flexion and abduction.   She ambulates with shuffling gait. Is unsteady on her feet. Right foot doesn't swing past neutral with ambulation, though left does.   Cannot perform sit-to-stand test without arm lift-off assistance.   Assessment & Plan Age related osteoporosis, unspecified pathological fracture presence Has known osteoporosis and history of fragility fractures in the form of multiple vertebral stress fractures over the past 12 months. Unsure of her last DEXA scan date and results, but reports it was done years ago. Previously on Tymlos, now only on Ca and Vit D.  -DEXA ordered to get new baseline BMD -Continue Vit D and Ca supplementation -We discussed treatment options and patient would like to try 12 mo of Evenity then likely transition to Prolia. We will start this process once we receive the results of her DEXA. -She is a high fall risk as discussed below, so much of our visit focused on trying to work to improve her strength, balance and gait to reduce this risk. Abnormality of gait She has significant hip weakness which is contributing to her gait abnormality and fall risk. Note provided to bring to PT for recommendations on balance and hip  strengthening. At high risk for injury related to fall As above. We also discussed trip hazards around the house and seems she has grab bars in her bathroom and hand railings around the house. Encouraged elimination of throw rugs around the house.    Glean Salen, MD PGY-4, Sports Medicine Fellow [facility]  I observed and examined the patient with the [facility] resident and agree with assessment and plan.  Note reviewed and modified by me. Sterling Big, MD

## 2024-01-07 NOTE — Patient Instructions (Addendum)
Brenda Ortiz was evaluated today by Glean Salen, MD and Roanna Epley, MD for balance and osteoporosis.  Note to Physical Therapy: She is high risk for falls. She is not stable ambulating with can support. She has significant weakness with hip flexion and abduction bilaterally, right weaker than left. She cannot perform sit to stand test.  Recommend aggressive balance training and hip strengthening with physical therapy. Please provide [place] updates as she is progressing through her therapy.  We also will get updated bone density testing and start injectable osteoporosis medication.  [facility] at [facility] in: [facility] at [facility] Phone: [phone]

## 2024-01-11 ENCOUNTER — Other Ambulatory Visit: Payer: Self-pay | Admitting: *Deleted

## 2024-01-11 ENCOUNTER — Telehealth: Payer: Self-pay | Admitting: *Deleted

## 2024-01-11 DIAGNOSIS — M810 Age-related osteoporosis without current pathological fracture: Secondary | ICD-10-CM

## 2024-01-11 DIAGNOSIS — R269 Unspecified abnormalities of gait and mobility: Secondary | ICD-10-CM | POA: Diagnosis not present

## 2024-01-11 NOTE — Telephone Encounter (Signed)
 Patient is ready for scheduling on or after: 01/18/24 BUY AND BILL  Out-of-pocket cost due at time of visit: $35  Primary: [employer]  Evenity co-insurance: $35 Admin fee co-insurance: $0  Deductible: n/a  Prior Auth: Approved Auth #: [id number] Valid: 01/18/24 to 11/16/24 Key: [address] [employer]    ** This summary of benefits is an estimation of the patient's out-of-pocket cost. Exact cost may vary based on individual plan coverage.

## 2024-01-14 ENCOUNTER — Ambulatory Visit (HOSPITAL_BASED_OUTPATIENT_CLINIC_OR_DEPARTMENT_OTHER)
Admission: RE | Admit: 2024-01-14 | Discharge: 2024-01-14 | Disposition: A | Discharge status: Home / Self Care | Payer: Medicare PPO | Source: Ambulatory Visit | Attending: Sports Medicine | Admitting: Sports Medicine

## 2024-01-14 ENCOUNTER — Encounter: Payer: Self-pay | Admitting: Family Medicine

## 2024-01-14 DIAGNOSIS — M810 Age-related osteoporosis without current pathological fracture: Secondary | ICD-10-CM | POA: Insufficient documentation

## 2024-01-14 DIAGNOSIS — S32010A Wedge compression fracture of first lumbar vertebra, initial encounter for closed fracture: Secondary | ICD-10-CM | POA: Insufficient documentation

## 2024-01-14 DIAGNOSIS — R269 Unspecified abnormalities of gait and mobility: Secondary | ICD-10-CM | POA: Diagnosis not present

## 2024-01-14 DIAGNOSIS — Z780 Asymptomatic menopausal state: Secondary | ICD-10-CM | POA: Diagnosis not present

## 2024-01-14 DIAGNOSIS — E559 Vitamin D deficiency, unspecified: Secondary | ICD-10-CM | POA: Insufficient documentation

## 2024-01-14 NOTE — Addendum Note (Signed)
 Addended by: Annita Brod on: 01/14/2024 11:08 AM   Modules accepted: Orders

## 2024-01-18 DIAGNOSIS — R269 Unspecified abnormalities of gait and mobility: Secondary | ICD-10-CM | POA: Diagnosis not present

## 2024-01-20 LAB — PARATHYROID HORMONE, INTACT (NO CA): PTH: 31 pg/mL (ref 15–65)

## 2024-01-20 LAB — CBC WITH DIFFERENTIAL/PLATELET
Basophils Absolute: 0 10*3/uL (ref 0.0–0.2)
Basos: 0 %
EOS (ABSOLUTE): 0.3 10*3/uL (ref 0.0–0.4)
Eos: 3 %
Hematocrit: 42.5 % (ref 34.0–46.6)
Hemoglobin: 14 g/dL (ref 11.1–15.9)
Immature Grans (Abs): 0 10*3/uL (ref 0.0–0.1)
Immature Granulocytes: 0 %
Lymphocytes Absolute: 2.2 10*3/uL (ref 0.7–3.1)
Lymphs: 24 %
MCH: 31.7 pg (ref 26.6–33.0)
MCHC: 32.9 g/dL (ref 31.5–35.7)
MCV: 96 fL (ref 79–97)
Monocytes Absolute: 0.5 10*3/uL (ref 0.1–0.9)
Monocytes: 5 %
Neutrophils Absolute: 6.4 10*3/uL (ref 1.4–7.0)
Neutrophils: 68 %
Platelets: 246 10*3/uL (ref 150–450)
RBC: 4.42 x10E6/uL (ref 3.77–5.28)
RDW: 12.7 % (ref 11.7–15.4)
WBC: 9.4 10*3/uL (ref 3.4–10.8)

## 2024-01-20 LAB — COMPREHENSIVE METABOLIC PANEL
ALT: 15 IU/L (ref 0–32)
AST: 19 IU/L (ref 0–40)
Albumin: 4.2 g/dL (ref 3.8–4.8)
Alkaline Phosphatase: 70 IU/L (ref 44–121)
BUN/Creatinine Ratio: 14 (ref 12–28)
BUN: 14 mg/dL (ref 8–27)
Bilirubin Total: 0.3 mg/dL (ref 0.0–1.2)
CO2: 23 mmol/L (ref 20–29)
Calcium: 9 mg/dL (ref 8.7–10.3)
Chloride: 103 mmol/L (ref 96–106)
Creatinine, Ser: 0.99 mg/dL (ref 0.57–1.00)
Globulin, Total: 2.6 g/dL (ref 1.5–4.5)
Glucose: 114 mg/dL — ABNORMAL HIGH (ref 70–99)
Potassium: 4.1 mmol/L (ref 3.5–5.2)
Sodium: 140 mmol/L (ref 134–144)
Total Protein: 6.8 g/dL (ref 6.0–8.5)
eGFR: 58 mL/min/{1.73_m2} — ABNORMAL LOW (ref >59)

## 2024-01-20 LAB — PROTEIN ELECTROPHORESIS, SERUM
A/G Ratio: 1.4 (ref 0.7–1.7)
Albumin ELP: 4 g/dL (ref 2.9–4.4)
Alpha 1: 0.2 g/dL (ref 0.0–0.4)
Alpha 2: 0.6 g/dL (ref 0.4–1.0)
Beta: 0.9 g/dL (ref 0.7–1.3)
Gamma Globulin: 1 g/dL (ref 0.4–1.8)
Globulin, Total: 2.8 g/dL (ref 2.2–3.9)

## 2024-01-20 LAB — VITAMIN D 25 HYDROXY (VIT D DEFICIENCY, FRACTURES): Vit D, 25-Hydroxy: 38.9 ng/mL (ref 30.0–100.0)

## 2024-01-20 LAB — TSH: TSH: 1.85 u[IU]/mL (ref 0.450–4.500)

## 2024-01-21 ENCOUNTER — Ambulatory Visit: Payer: Medicare PPO | Admitting: Family Medicine

## 2024-01-21 ENCOUNTER — Encounter: Payer: Self-pay | Admitting: Family Medicine

## 2024-01-21 ENCOUNTER — Ambulatory Visit: Admitting: Family Medicine

## 2024-01-21 VITALS — Ht 67.0 in

## 2024-01-21 VITALS — BP 160/80 | Ht 67.0 in | Wt 150.0 lb

## 2024-01-21 DIAGNOSIS — M810 Age-related osteoporosis without current pathological fracture: Secondary | ICD-10-CM | POA: Diagnosis not present

## 2024-01-21 DIAGNOSIS — S32010S Wedge compression fracture of first lumbar vertebra, sequela: Secondary | ICD-10-CM | POA: Diagnosis not present

## 2024-01-21 DIAGNOSIS — Z9181 History of falling: Secondary | ICD-10-CM | POA: Diagnosis not present

## 2024-01-21 MED ORDER — ROMOSOZUMAB-AQQG 105 MG/1.17ML ~~LOC~~ SOSY
210.0000 mg | PREFILLED_SYRINGE | Freq: Once | SUBCUTANEOUS | Status: AC
  Administered 2024-01-21: 210 mg via SUBCUTANEOUS

## 2024-01-21 NOTE — Assessment & Plan Note (Signed)
 L1 s/p kyphoplasty in 04/2023 and also hx of L4 compression fx - fragility fractures due to underlying osteoporosis  PLAN: - proceed with osteoporosis tx as noted above - treatment with Evenity once a month for the next 12 months, then transition to Prolia every 6 months thereafter.  Patient will receive her first Evenity injection in the office today. -Follow-up for second Evenity injection in 1 month

## 2024-01-21 NOTE — Progress Notes (Signed)
 Patient is here for evenity injection #1. Patient received bilateral arm [place] evenity injections today. She tolerated injections well. Pt waited 15 mins and no reaction noted. She will return in 1 month for her next evenity injection.

## 2024-01-21 NOTE — Assessment & Plan Note (Addendum)
 Patient with normal T-scores on recent DEXA scan, but history of multiple fragility fractures including multiple vertebral compression fractures over the last 12 months which is consistent with osteoporosis -Previously on Tymlos for 1 to 2 months without any side effects, but did not complete full treatment series due to logistics of getting the medication.  Current regimen only includes calcium and vitamin D  Plan: -Labs from 01/14/2024 reviewed.  Values as noted above.  Normal calcium, normal vitamin D, normal SPEP, normal CMP, normal CBC, normal PTH -DEXA scan reviewed.  Even though her T-scores were normal benefit from osteoporosis treatment given her multiple vertebral compression fracture/fragility fractures over the last year -Should continue with calcium 1200 mg daily plus vitamin D 800 and show units daily -Recommend treatment with Evenity once a month for the next 12 months, then transition to Prolia every 6 months thereafter.  Patient will receive her first Evenity injection in the office today. -Follow-up for second Evenity injection in 1 month -Follow-up for an office visit with me in 1 year (March 2026)

## 2024-01-21 NOTE — Progress Notes (Signed)
 DATE OF VISIT: 01/21/2024        [name] DOB: 1941/04/07 MRN: [id number]  CC:  f/u osteoporosis - review labs & bone density scan  History of present Illness: Brenda Ortiz is a 83 y.o. female who presents for a follow-up visit for osteoporosis management Seen by Dr Darrick Penna 01/07/24 - known osteoporosis - history includes a previous left THA in 2021 and lumbar decompression/laminectomy in 2022 with Dr. Maurice Small.  - Two compression fractures in her lumbar spine in the past year - L1 s/p kyphoplasty in 04/2023 and another at L4  - hx of Tymlos tx - finished >1 year ago  Labs completed 01/14/24 & results as noted below - normal Vit D - normal Ca  DEXA scan completed 01/14/24 showing: - Lt forearm Tscore 0.1 - Rt femur neck Tscore 0.1 - Rt femur total Tscore 0.6 - no prior to compare it to  Medications:  Outpatient Encounter Medications as of 01/21/2024  Medication Sig   acetaminophen-codeine (TYLENOL #3) 300-30 MG tablet Take 1-2 tablets by mouth every 8 (eight) hours as needed.   calcium-vitamin D (OSCAL) 250-125 MG-UNIT per tablet Take 1 tablet by mouth at bedtime.    fexofenadine (ALLEGRA) 180 MG tablet Take 180 mg by mouth daily as needed for allergies or rhinitis.   Multiple Vitamin (MULTIVITAMIN WITH MINERALS) TABS tablet Take 1 tablet by mouth at bedtime.    MYRBETRIQ 50 MG TB24 tablet Take 50 mg by mouth daily.   olmesartan (BENICAR) 20 MG tablet Take 20 mg by mouth daily.   PREMPRO 0.625-2.5 MG per tablet Take 1 tablet by mouth at bedtime.    SYNTHROID 75 MCG tablet Take 75 mcg by mouth daily before breakfast.    No facility-administered encounter medications on file as of 01/21/2024.    Allergies: has no known allergies.  Physical Examination: Vitals: BP (!) 160/80   Ht 5\' 7"  (1.702 m)   Wt 150 lb (68 kg)   BMI 23.49 kg/m  GENERAL:  Brenda Ortiz is a 83 y.o. female appearing their stated age, alert and oriented x 3, in no apparent distress.  MSK:  Comfortably in exam room chair.  Ambulating with the assistance of a single-point cane  LABS: Recent Results (from the past 2160 hours)  VITAMIN D 25 Hydroxy (Vit-D Deficiency, Fractures)     Status: None   Collection Time: 01/14/24  2:02 PM  Result Value Ref Range   Vit D, 25-Hydroxy 38.9 30.0 - 100.0 ng/mL    Comment: Vitamin D deficiency has been defined by the Institute of Medicine and an Endocrine Society practice guideline as a level of serum 25-OH vitamin D less than 20 ng/mL (1,2). The Endocrine Society went on to further define vitamin D insufficiency as a level between 21 and 29 ng/mL (2). 1. IOM (Institute of Medicine). 2010. Dietary reference    intakes for calcium and D. [place]: The    [employer]. 2. Holick MF, Binkley [place], Bischoff-Ferrari HA, et al.    Evaluation, treatment, and prevention of vitamin D    deficiency: an Endocrine Society clinical practice    guideline. JCEM. 2011 Jul; 96(7):1911-30.   Comprehensive metabolic panel     Status: Abnormal   Collection Time: 01/14/24  2:02 PM  Result Value Ref Range   Glucose 114 (H) 70 - 99 mg/dL   BUN 14 8 - 27 mg/dL   Creatinine, Ser 9.62 0.57 - 1.00 mg/dL   eGFR 58 (L) >95  mL/min/1.73   BUN/Creatinine Ratio 14 12 - 28   Sodium 140 134 - 144 mmol/L   Potassium 4.1 3.5 - 5.2 mmol/L   Chloride 103 96 - 106 mmol/L   CO2 23 20 - 29 mmol/L   Calcium 9.0 8.7 - 10.3 mg/dL   Total Protein 6.8 6.0 - 8.5 g/dL   Albumin 4.2 3.8 - 4.8 g/dL   Globulin, Total 2.6 1.5 - 4.5 g/dL   Bilirubin Total 0.3 0.0 - 1.2 mg/dL   Alkaline Phosphatase 70 44 - 121 IU/L   AST 19 0 - 40 IU/L   ALT 15 0 - 32 IU/L  CBC with Differential/Platelet     Status: None   Collection Time: 01/14/24  2:02 PM  Result Value Ref Range   WBC 9.4 3.4 - 10.8 x10E3/uL   RBC 4.42 3.77 - 5.28 x10E6/uL   Hemoglobin 14.0 11.1 - 15.9 g/dL   Hematocrit 57.8 46.9 - 46.6 %   MCV 96 79 - 97 fL   MCH 31.7 26.6 - 33.0 pg   MCHC 32.9 31.5 - 35.7  g/dL   RDW 62.9 52.8 - 41.3 %   Platelets 246 150 - 450 x10E3/uL   Neutrophils 68 Not Estab. %   Lymphs 24 Not Estab. %   Monocytes 5 Not Estab. %   Eos 3 Not Estab. %   Basos 0 Not Estab. %   Neutrophils Absolute 6.4 1.4 - 7.0 x10E3/uL   Lymphocytes Absolute 2.2 0.7 - 3.1 x10E3/uL   Monocytes Absolute 0.5 0.1 - 0.9 x10E3/uL   EOS (ABSOLUTE) 0.3 0.0 - 0.4 x10E3/uL   Basophils Absolute 0.0 0.0 - 0.2 x10E3/uL   Immature Granulocytes 0 Not Estab. %   Immature Grans (Abs) 0.0 0.0 - 0.1 x10E3/uL  Protein electrophoresis, serum     Status: None   Collection Time: 01/14/24  2:02 PM  Result Value Ref Range   Albumin ELP 4.0 2.9 - 4.4 g/dL   Alpha 1 0.2 0.0 - 0.4 g/dL   Alpha 2 0.6 0.4 - 1.0 g/dL   Beta 0.9 0.7 - 1.3 g/dL   Gamma Globulin 1.0 0.4 - 1.8 g/dL   M-Spike, % Not Observed Not Observed g/dL   Globulin, Total 2.8 2.2 - 3.9 g/dL   A/G Ratio 1.4 0.7 - 1.7   Please Note: Comment     Comment: Protein electrophoresis scan will follow via computer, mail, or courier delivery.    Interpretation: Comment     Comment: The SPE pattern appears unremarkable. Evidence of monoclonal protein is not apparent.   TSH     Status: None   Collection Time: 01/14/24  2:02 PM  Result Value Ref Range   TSH 1.850 0.450 - 4.500 uIU/mL  Parathyroid hormone, intact (no Ca)     Status: None   Collection Time: 01/14/24  2:02 PM  Result Value Ref Range   PTH 31 15 - 65 pg/mL    Radiology: DEXA SCAN 01/14/24 at [facility]: - Lt forearm Tscore 0.1 - Rt femur neck Tscore 0.1 - Rt femur total Tscore 0.6 - no prior to compare it to  XRAY:  IR KYPHOPLASTY LUMBAR 05/15/23 showing: IMPRESSION: 1. Successful fluoroscopy-guided bilateral transpedicular approach for L1 vertebral body core bone biopsy and kyphoplasty for treatment of presumed osteoporotic fragility fracture. Bone samples obtained were sent for pathology analysis. 2. If the patient has known osteoporosis, recommend treatment  as clinically indicated. If the patient's bone density status is unknown, DEXA scan is recommended.  Assessment & Plan Age-related osteoporosis without current pathological fracture Patient with normal T-scores on recent DEXA scan, but history of multiple fragility fractures including multiple vertebral compression fractures over the last 12 months which is consistent with osteoporosis -Previously on Tymlos for 1 to 2 months without any side effects, but did not complete full treatment series due to logistics of getting the medication.  Current regimen only includes calcium and vitamin D  Plan: -Labs from 01/14/2024 reviewed.  Values as noted above.  Normal calcium, normal vitamin D, normal SPEP, normal CMP, normal CBC, normal PTH -DEXA scan reviewed.  Even though her T-scores were normal benefit from osteoporosis treatment given her multiple vertebral compression fracture/fragility fractures over the last year -Should continue with calcium 1200 mg daily plus vitamin D 800 and show units daily -Recommend treatment with Evenity once a month for the next 12 months, then transition to Prolia every 6 months thereafter.  Patient will receive her first Evenity injection in the office today. -Follow-up for second Evenity injection in 1 month -Follow-up for an office visit with me in 1 year (March 2026) Compression fracture of L1 vertebra, sequela  L1 s/p kyphoplasty in 04/2023 and also hx of L4 compression fx - fragility fractures due to underlying osteoporosis  PLAN: - proceed with osteoporosis tx as noted above - treatment with Evenity once a month for the next 12 months, then transition to Prolia every 6 months thereafter.  Patient will receive her first Evenity injection in the office today. -Follow-up for second Evenity injection in 1 month At high risk for injury related to fall Even though patient has normal DEXA scan with normal T-scores, she does have history of fragility fractures and is at  increased risk of falls and potentially future morbidity related to this.  Plan: -She should continue with physical therapy as previously recommended -Continue osteoporosis regimen as noted above to improve overall strength of her bones  Patient expressed understanding & agreement with above.  Encounter Diagnoses  Name Primary?   Age-related osteoporosis without current pathological fracture Yes   Compression fracture of L1 vertebra, sequela    At high risk for injury related to fall     No orders of the defined types were placed in this encounter.

## 2024-01-25 DIAGNOSIS — R269 Unspecified abnormalities of gait and mobility: Secondary | ICD-10-CM | POA: Diagnosis not present

## 2024-01-28 DIAGNOSIS — R269 Unspecified abnormalities of gait and mobility: Secondary | ICD-10-CM | POA: Diagnosis not present

## 2024-02-01 DIAGNOSIS — R269 Unspecified abnormalities of gait and mobility: Secondary | ICD-10-CM | POA: Diagnosis not present

## 2024-02-04 DIAGNOSIS — R269 Unspecified abnormalities of gait and mobility: Secondary | ICD-10-CM | POA: Diagnosis not present

## 2024-02-08 DIAGNOSIS — R269 Unspecified abnormalities of gait and mobility: Secondary | ICD-10-CM | POA: Diagnosis not present

## 2024-02-11 DIAGNOSIS — R269 Unspecified abnormalities of gait and mobility: Secondary | ICD-10-CM | POA: Diagnosis not present

## 2024-02-15 DIAGNOSIS — R269 Unspecified abnormalities of gait and mobility: Secondary | ICD-10-CM | POA: Diagnosis not present

## 2024-02-18 DIAGNOSIS — R269 Unspecified abnormalities of gait and mobility: Secondary | ICD-10-CM | POA: Diagnosis not present

## 2024-02-19 DIAGNOSIS — I10 Essential (primary) hypertension: Secondary | ICD-10-CM | POA: Diagnosis not present

## 2024-02-19 DIAGNOSIS — R030 Elevated blood-pressure reading, without diagnosis of hypertension: Secondary | ICD-10-CM | POA: Diagnosis not present

## 2024-02-22 DIAGNOSIS — R269 Unspecified abnormalities of gait and mobility: Secondary | ICD-10-CM | POA: Diagnosis not present

## 2024-02-23 ENCOUNTER — Ambulatory Visit
Admission: RE | Admit: 2024-02-23 | Discharge: 2024-02-23 | Disposition: A | Discharge status: Home / Self Care | Source: Ambulatory Visit | Attending: Family Medicine | Admitting: Family Medicine

## 2024-02-23 ENCOUNTER — Ambulatory Visit: Admitting: Family Medicine

## 2024-02-23 ENCOUNTER — Encounter: Payer: Self-pay | Admitting: Family Medicine

## 2024-02-23 VITALS — BP 157/54 | Ht 66.0 in | Wt 150.0 lb

## 2024-02-23 VITALS — Ht 67.0 in

## 2024-02-23 DIAGNOSIS — M545 Low back pain, unspecified: Secondary | ICD-10-CM | POA: Diagnosis not present

## 2024-02-23 DIAGNOSIS — M810 Age-related osteoporosis without current pathological fracture: Secondary | ICD-10-CM | POA: Diagnosis not present

## 2024-02-23 DIAGNOSIS — M549 Dorsalgia, unspecified: Secondary | ICD-10-CM | POA: Diagnosis not present

## 2024-02-23 DIAGNOSIS — R030 Elevated blood-pressure reading, without diagnosis of hypertension: Secondary | ICD-10-CM

## 2024-02-23 DIAGNOSIS — Z9889 Other specified postprocedural states: Secondary | ICD-10-CM | POA: Diagnosis not present

## 2024-02-23 MED ORDER — GABAPENTIN 100 MG PO CAPS: ORAL_CAPSULE | ORAL | 1 refills | Status: AC

## 2024-02-23 MED ORDER — ROMOSOZUMAB-AQQG 105 MG/1.17ML ~~LOC~~ SOSY
210.0000 mg | PREFILLED_SYRINGE | Freq: Once | SUBCUTANEOUS | Status: AC
  Administered 2024-02-23: 210 mg via SUBCUTANEOUS

## 2024-02-23 NOTE — Progress Notes (Signed)
Patient is here for evenity injection #2. Patient received bilateral arm [place] evenity injections today. She tolerated injections well. She will return in 1 month for her next evenity injection.

## 2024-02-23 NOTE — Telephone Encounter (Signed)
 Evenity Injection Schedule Inj #1 - 02/23/24 Inj #2 - scheduled 03/28/24 Inj #3  Inj #4  Inj #5  Inj #6  Inj #7  Inj #8  Inj #9  Inj #10  Inj #11  Inj #12

## 2024-02-23 NOTE — Assessment & Plan Note (Signed)
 Acute on chronic right-sided low back pain with history of multiple lumbar compression fractures, last approximately a year ago at the level of L4.  Limited improvement with prior epidural injections and lumbar ablation - Concern for possible new compression fracture  Plan: -Imaging: Will obtain updated lumbar x-rays to rule out new compression fracture or progression of previous compression deformity - Discussed medication options.  She has never tried gabapentin.  She is interested in trying this.  Rx gabapentin 100 mg take 1 tab nightly x 3 days, then 2 tabs p.o. nightly x 3 days, then 3 tabs p.o. nightly thereafter.  Did advise that it can cause drowsiness, so should be careful while taking medication.  Since taking at bedtime hopefully she will sleep through without any side effects - She should continue her osteoporosis therapy as she is doing - Follow-up 4 weeks for reevaluation, sooner as needed

## 2024-02-23 NOTE — Progress Notes (Signed)
 DATE OF VISIT: 02/23/2024        Brenda Ortiz DOB: 09/11/1941 MRN: [id number]  CC:  Rt sided LBP  History of present Illness: Brenda Ortiz is a 83 y.o. female who presents for evaluation of Rt-sided LBP Last seen by me 01/21/24 for Osteoporosis f/u PMH significant for osteoporosis undergoing tx with Evenity (completed 2/12 injections) , lumbar decompression/laminectomy in 2022 with Dr. Maurice Small, two compression fractures in her lumbar spine in the past year - L1 s/p kyphoplasty in 04/2023 and another at L4   Having pain in the right low back Occasional radiation to the right anterior hip Worse when walking Occasional pain when sitting Denies radiation down the leg Denies numbness/tingling Denies lower ext weakness No issues with sleep Taking Tylenol Extra strength 2 tabs daily prn No injury/trauma Hx of prior ESIs and ablation without improvement No recent imaging  Medications:  Outpatient Encounter Medications as of 02/23/2024  Medication Sig   gabapentin (NEURONTIN) 100 MG capsule Take 1 capsule (100 mg total) by mouth at bedtime for 3 days. Then take 2 capsules (200 mg) at bedtime for 3 days. Then take 3 capsules (300 mg) at bedtime and stay at that dose.   ROMOSOZUMAB-AQQG [place] Inject 210 mg into the skin every 30 (thirty) days. Receives monthly injection at [facility]   calcium-vitamin D [facility]) 250-125 MG-UNIT per tablet Take 1 tablet by mouth at bedtime.    fexofenadine (ALLEGRA) 180 MG tablet Take 180 mg by mouth daily as needed for allergies or rhinitis.   Multiple Vitamin (MULTIVITAMIN WITH MINERALS) TABS tablet Take 1 tablet by mouth at bedtime.    MYRBETRIQ 50 MG TB24 tablet Take 50 mg by mouth daily.   PREMPRO 0.625-2.5 MG per tablet Take 1 tablet by mouth at bedtime.    SYNTHROID 75 MCG tablet Take 75 mcg by mouth daily before breakfast.    No facility-administered encounter medications on file as of 02/23/2024.    Allergies: has no  known allergies.  Physical Examination: Vitals: BP (!) 157/54   Ht 5\' 6"  (1.676 m)   Wt 150 lb (68 kg)   BMI 24.21 kg/m  GENERAL:  Brenda Ortiz is a 83 y.o. female appearing their stated age, alert and oriented x 3, in no apparent distress.  SKIN: no rashes or lesions, skin clean, dry, intact MSK: L-spine without any gross deformity.  No midline tenderness.  Does have right-sided paraspinal tenderness at the level of L4 and L5.  Good range of motion with some pain at terminal forward flexion and extension.  Negative straight leg raise bilaterally.  Good lower extremity strength 5/5 bilaterally.  Walking with the assistance of a cane. NEURO: sensation intact to light touch, DTR 2/4 Achilles and patella bilaterally VASC: no edema Assessment & Plan Acute right-sided low back pain without sciatica Acute on chronic right-sided low back pain with history of multiple lumbar compression fractures, last approximately a year ago at the level of L4.  Limited improvement with prior epidural injections and lumbar ablation - Concern for possible new compression fracture  Plan: -Imaging: Will obtain updated lumbar x-rays to rule out new compression fracture or progression of previous compression deformity - Discussed medication options.  She has never tried gabapentin.  She is interested in trying this.  Rx gabapentin 100 mg take 1 tab nightly x 3 days, then 2 tabs p.o. nightly x 3 days, then 3 tabs p.o. nightly thereafter.  Did advise that it can cause  drowsiness, so should be careful while taking medication.  Since taking at bedtime hopefully she will sleep through without any side effects - She should continue her osteoporosis therapy as she is doing - Follow-up 4 weeks for reevaluation, sooner as needed Elevated blood pressure reading Elevated blood pressure today at 157/54  Plan: - Should follow-up with PCP regarding blood pressure   Patient expressed understanding & agreement with  above.  Encounter Diagnoses  Name Primary?   Acute right-sided low back pain without sciatica Yes   Elevated blood pressure reading     Orders Placed This Encounter  Procedures   DG Lumbar Spine 2-3 Views

## 2024-02-25 DIAGNOSIS — R269 Unspecified abnormalities of gait and mobility: Secondary | ICD-10-CM | POA: Diagnosis not present

## 2024-02-26 ENCOUNTER — Encounter: Payer: Self-pay | Admitting: Family Medicine

## 2024-02-29 DIAGNOSIS — R269 Unspecified abnormalities of gait and mobility: Secondary | ICD-10-CM | POA: Diagnosis not present

## 2024-03-03 DIAGNOSIS — R269 Unspecified abnormalities of gait and mobility: Secondary | ICD-10-CM | POA: Diagnosis not present

## 2024-03-07 DIAGNOSIS — R269 Unspecified abnormalities of gait and mobility: Secondary | ICD-10-CM | POA: Diagnosis not present

## 2024-03-10 DIAGNOSIS — R269 Unspecified abnormalities of gait and mobility: Secondary | ICD-10-CM | POA: Diagnosis not present

## 2024-03-17 DIAGNOSIS — R269 Unspecified abnormalities of gait and mobility: Secondary | ICD-10-CM | POA: Diagnosis not present

## 2024-03-21 DIAGNOSIS — R269 Unspecified abnormalities of gait and mobility: Secondary | ICD-10-CM | POA: Diagnosis not present

## 2024-03-24 DIAGNOSIS — R269 Unspecified abnormalities of gait and mobility: Secondary | ICD-10-CM | POA: Diagnosis not present

## 2024-03-28 ENCOUNTER — Ambulatory Visit: Admitting: Family Medicine

## 2024-03-28 VITALS — Ht 67.0 in

## 2024-03-28 VITALS — BP 137/63 | Ht 67.0 in | Wt 150.0 lb

## 2024-03-28 DIAGNOSIS — M545 Low back pain, unspecified: Secondary | ICD-10-CM

## 2024-03-28 DIAGNOSIS — M810 Age-related osteoporosis without current pathological fracture: Secondary | ICD-10-CM

## 2024-03-28 DIAGNOSIS — R269 Unspecified abnormalities of gait and mobility: Secondary | ICD-10-CM | POA: Diagnosis not present

## 2024-03-28 DIAGNOSIS — M47816 Spondylosis without myelopathy or radiculopathy, lumbar region: Secondary | ICD-10-CM | POA: Diagnosis not present

## 2024-03-28 MED ORDER — ROMOSOZUMAB-AQQG 105 MG/1.17ML ~~LOC~~ SOSY
210.0000 mg | PREFILLED_SYRINGE | Freq: Once | SUBCUTANEOUS | Status: AC
  Administered 2024-03-28: 210 mg via SUBCUTANEOUS

## 2024-03-28 NOTE — Progress Notes (Cosign Needed)
 PCP: Avva, Ravisankar, MD  Subjective:   HPI: Patient is a 83 y.o. female here for f/u of RT low back pain.  [place] has had persistent RT low back pain that has interfered with her mobility and walking lately. Seen 4 weeks ago with Dr. Howard Macho. Had negative X-rays ruling out compression fracture. Pain has not worsened. No difference in pain with Gabapentin . She was unable to tolerate 3 pills/day due to nausea and went down to 1 pill/day. She is going to [facility] 2x/week and doing Pilates multiple times/week.  She reports intermittently continuing her home PT exercises between sessions. Denies any radiating pain. Denies any numbness/tingling. Denies any new leg weakness. Denies any recent falls.  Past Medical History:  Diagnosis Date   Arthritis    Herniated lumbar intervertebral disc 2022   Hypothyroidism    Incontinence    Seasonal allergies    UTI (urinary tract infection) 06/2021    Current Outpatient Medications on File Prior to Visit  Medication Sig Dispense Refill   calcium -vitamin D  (OSCAL) 250-125 MG-UNIT per tablet Take 1 tablet by mouth at bedtime.      fexofenadine (ALLEGRA) 180 MG tablet Take 180 mg by mouth daily as needed for allergies or rhinitis.     gabapentin  (NEURONTIN ) 100 MG capsule Take 1 capsule (100 mg total) by mouth at bedtime for 3 days. Then take 2 capsules (200 mg) at bedtime for 3 days. Then take 3 capsules (300 mg) at bedtime and stay at that dose. 90 capsule 1   Multiple Vitamin (MULTIVITAMIN WITH MINERALS) TABS tablet Take 1 tablet by mouth at bedtime.      MYRBETRIQ  50 MG TB24 tablet Take 50 mg by mouth daily.     PREMPRO 0.625-2.5 MG per tablet Take 1 tablet by mouth at bedtime.      ROMOSOZUMAB -AQQG [place] Inject 210 mg into the skin every 30 (thirty) days. Receives monthly injection at [facility]     SYNTHROID  75 MCG tablet Take 75 mcg by mouth daily before breakfast.      No current facility-administered medications  on file prior to visit.    Past Surgical History:  Procedure Laterality Date   DILATION AND CURETTAGE OF UTERUS     x2   FOOT SURGERY Right    HYSTEROSCOPY WITH D & C N/A 08/14/2016   Procedure: DILATATION AND CURETTAGE /HYSTEROSCOPY;  Surgeon: Meriam Stamp, MD;  Location: [facility];  Service: Gynecology;  Laterality: N/A;   IR KYPHO LUMBAR INC FX REDUCE BONE BX UNI/BIL CANNULATION INC/IMAGING  05/15/2023   JOINT REPLACEMENT Left 02/03/2020   LUMBAR LAMINECTOMY/ DECOMPRESSION WITH MET-RX Right 08/20/2021   Procedure: Right Thoracic 12-Lumbar 1 Minimally invasive discectomy with metrx;  Surgeon: Cannon Champion, MD;  Location: [facility] OR;  Service: Neurosurgery;  Laterality: Right;  3C/RM 19   TONSILLECTOMY     TOTAL HIP ARTHROPLASTY Left 02/03/2020   Procedure: LEFT TOTAL HIP ARTHROPLASTY ANTERIOR APPROACH;  Surgeon: Arnie Lao, MD;  Location: [facility];  Service: Orthopedics;  Laterality: Left;    No Known Allergies  BP 137/63   Ht 5\' 7"  (1.702 m)   Wt 150 lb (68 kg)   BMI 23.49 kg/m       No data to display              No data to display              Objective:  Physical Exam:  Gen: NAD, comfortable in  exam room  Low back exam: Inspection: No obvious deformity or ecchymoses.  Palpation: TTP at the top of the right SI joint. No TTP along bony processes, paraspinal muscles, or greater trochanter. ROM: Full ROM with hip flexion. Strength: 5/5 strength with hip flexion, knee extension/flexion Special Tests: Positive FABER. Negative Slump test.  Reviewed impression of lumbar X-ray from 02/23/24: Status post kyphoplasty of L1 vertebral body. Multilevel degenerative disc disease and facet disease.  Assessment & Plan:  Ms. Taunya has a hx of multiple lumbar compression fractures, osteoporosis, and is s/p kyphoplasty of L1 vertebral body (2022) and is presenting for 4-week f/u for low back pain.   1. RT low back pain without radiculopathy. Patient endorses  pain is still present and she did not tolerate higher dose of Gabapentin  due to nausea. Denies any radiculopathy, numbness/tingling, or leg weakness. Lumbar spine X-ray 02/23/24 showed no compression fracture. Exam today showed SI joint tenderness with positive FABER concerning for sacroiliitis.  - Followed-up with and advised [facility] to add focus on right SI joint for patient's PT - Discussed trial off Gabapentin  to see whether 1 tablet/day is providing any relief - Discussed risks and benefits of: TCA, tramadol , prednisone course, and steroid injection with patient - F/u with Dr. Howard Macho in 4 weeks  Unknown Garbe, [facility]

## 2024-03-28 NOTE — Progress Notes (Unsigned)
Patient is here for evenity injection #3. Patient received bilateral arm [place] evenity injections today. She tolerated injections well. She will return in 1 month for her next evenity injection.   

## 2024-03-28 NOTE — Patient Instructions (Signed)
 We will add the SI joint to your physical therapy. Do home exercises daily on days you don't go to therapy. Follow up with Dr. Howard Macho in 1 month.

## 2024-03-31 DIAGNOSIS — R269 Unspecified abnormalities of gait and mobility: Secondary | ICD-10-CM | POA: Diagnosis not present

## 2024-04-04 DIAGNOSIS — R269 Unspecified abnormalities of gait and mobility: Secondary | ICD-10-CM | POA: Diagnosis not present

## 2024-04-07 DIAGNOSIS — R269 Unspecified abnormalities of gait and mobility: Secondary | ICD-10-CM | POA: Diagnosis not present

## 2024-04-14 DIAGNOSIS — M47816 Spondylosis without myelopathy or radiculopathy, lumbar region: Secondary | ICD-10-CM | POA: Diagnosis not present

## 2024-04-14 DIAGNOSIS — R269 Unspecified abnormalities of gait and mobility: Secondary | ICD-10-CM | POA: Diagnosis not present

## 2024-04-18 DIAGNOSIS — R269 Unspecified abnormalities of gait and mobility: Secondary | ICD-10-CM | POA: Diagnosis not present

## 2024-04-25 DIAGNOSIS — R269 Unspecified abnormalities of gait and mobility: Secondary | ICD-10-CM | POA: Diagnosis not present

## 2024-04-28 DIAGNOSIS — R269 Unspecified abnormalities of gait and mobility: Secondary | ICD-10-CM | POA: Diagnosis not present

## 2024-05-02 ENCOUNTER — Ambulatory Visit: Admitting: Family Medicine

## 2024-05-02 ENCOUNTER — Encounter: Payer: Self-pay | Admitting: Family Medicine

## 2024-05-02 VITALS — BP 142/65 | Ht 67.0 in | Wt 150.0 lb

## 2024-05-02 DIAGNOSIS — M810 Age-related osteoporosis without current pathological fracture: Secondary | ICD-10-CM

## 2024-05-02 DIAGNOSIS — M545 Low back pain, unspecified: Secondary | ICD-10-CM | POA: Diagnosis not present

## 2024-05-02 DIAGNOSIS — R269 Unspecified abnormalities of gait and mobility: Secondary | ICD-10-CM | POA: Diagnosis not present

## 2024-05-02 MED ORDER — ROMOSOZUMAB-AQQG 105 MG/1.17ML ~~LOC~~ SOSY
210.0000 mg | PREFILLED_SYRINGE | Freq: Once | SUBCUTANEOUS | Status: AC
  Administered 2024-05-02: 210 mg via SUBCUTANEOUS

## 2024-05-02 NOTE — Progress Notes (Signed)
 Patient is here for evenity injection #4. Patient received bilateral arm [place] evenity injections today. She tolerated injections well. She will return in 1 month for her next evenity injection

## 2024-05-02 NOTE — Assessment & Plan Note (Signed)
 Plan: - Due for Evenity  injection today.  Will be completed with medical assistant - Follow-up next month for next Evenity  injection as indicated

## 2024-05-02 NOTE — Progress Notes (Signed)
 DATE OF VISIT: 05/02/2024        Brenda Ortiz DOB: 1941/09/11 MRN: [id number]  CC:  f/u low back pain  History of present Illness: Brenda Ortiz is a 83 y.o. female who presents for a follow-up visit for low back pain Last seen by me 02/23/24 History of multiple lumbar compression fractures, last over a year ago at the level of L4 After last visit obtain updated x-rays that showed no significant changes from previous She was instructed to continue with her gabapentin   Was seen in follow-up by Dr. Merle Starcher 03/28/2024 - Had no difference in symptoms with gabapentin .  Was unable to tolerate 3 tabs a day due to nausea and did she decrease to 1 tab a day - Doing PT twice a week and doing Pilates multiple times a week - SI joint therapy was added to her PT prescription and was recommended to follow-up with us  in about a month  Today she reports ongoing pain in the right low back. Has continued to do physical therapy twice a week, she thinks the new exercises added after last visit have been helpful Continues to do Pilates 2 times a week as well Has been taking Tylenol  extra strength 2 tabs once a day as needed, but not on a regular basis Taking gabapentin  100 mg at bedtime, she does not think it is helpful Denies worsening pain Denies any changes in bowel or bladder  Medications:  Outpatient Encounter Medications as of 05/02/2024  Medication Sig   calcium -vitamin D  (OSCAL) 250-125 MG-UNIT per tablet Take 1 tablet by mouth at bedtime.    fexofenadine (ALLEGRA) 180 MG tablet Take 180 mg by mouth daily as needed for allergies or rhinitis.   gabapentin  (NEURONTIN ) 100 MG capsule Take 1 capsule (100 mg total) by mouth at bedtime for 3 days. Then take 2 capsules (200 mg) at bedtime for 3 days. Then take 3 capsules (300 mg) at bedtime and stay at that dose.   Multiple Vitamin (MULTIVITAMIN WITH MINERALS) TABS tablet Take 1 tablet by mouth at bedtime.    MYRBETRIQ  50 MG TB24 tablet Take 50 mg  by mouth daily.   PREMPRO 0.625-2.5 MG per tablet Take 1 tablet by mouth at bedtime.    ROMOSOZUMAB -AQQG [place] Inject 210 mg into the skin every 30 (thirty) days. Receives monthly injection at [facility]   SYNTHROID  75 MCG tablet Take 75 mcg by mouth daily before breakfast.    No facility-administered encounter medications on file as of 05/02/2024.    Allergies: has no known allergies.  Physical Examination: Vitals: BP (!) 142/65 (BP Location: Left Arm, Patient Position: Sitting, Cuff Size: Normal)   Ht 5' 7 (1.702 m)   Wt 150 lb (68 kg)   BMI 23.49 kg/m  GENERAL:  Brenda Ortiz is a 83 y.o. female appearing their stated age, alert and oriented x 3, in no apparent distress.  SKIN: no rashes or lesions, skin clean, dry, intact MSK: Lumbar spine without any gross deformity.  No midline tenderness.  Does have right-sided paraspinal tenderness at the lumbosacral junction and SI joint.  Has good range of motion with some pain at terminal flexion and extension.  Ambulating with the assistance of a cane. Neurovascularly intact distally  Assessment & Plan Acute right-sided low back pain without sciatica Cute on chronic right-sided low back pain with history of multiple lumbar compression fractures, last little over a year ago at the level of L4.  Limited improvement with  prior epidural injections and lumbar ablation.  Recent follow-up x-rays were negative for new compression fracture  Plan: - Was unable to tolerate gabapentin , will not continue this at this time. - She should continue the physical therapy and Pilates as she is doing - She has not tried taking Tylenol  on a regular basis.  Advise she can safely take Tylenol  Extra Strength 1-2 tabs every 6-8 hours as needed.  She will try to take this on a more regimented basis. - If she continues to have ongoing pain, could consider merits of possible tramadol , but have concerns given underlying balance/dizziness.  We  will reassess her progress in the future - Follow-up 4 weeks to reassess, sooner as needed Age-related osteoporosis without current pathological fracture Plan: - Due for Evenity  injection today.  Will be completed with medical assistant - Follow-up next month for next Evenity  injection as indicated   Patient expressed understanding & agreement with above.  Encounter Diagnoses  Name Primary?   Acute right-sided low back pain without sciatica Yes   Age-related osteoporosis without current pathological fracture     No orders of the defined types were placed in this encounter.

## 2024-05-02 NOTE — Assessment & Plan Note (Signed)
 Cute on chronic right-sided low back pain with history of multiple lumbar compression fractures, last little over a year ago at the level of L4.  Limited improvement with prior epidural injections and lumbar ablation.  Recent follow-up x-rays were negative for new compression fracture  Plan: - Was unable to tolerate gabapentin , will not continue this at this time. - She should continue the physical therapy and Pilates as she is doing - She has not tried taking Tylenol  on a regular basis.  Advise she can safely take Tylenol  Extra Strength 1-2 tabs every 6-8 hours as needed.  She will try to take this on a more regimented basis. - If she continues to have ongoing pain, could consider merits of possible tramadol , but have concerns given underlying balance/dizziness.  We will reassess her progress in the future - Follow-up 4 weeks to reassess, sooner as needed

## 2024-05-05 DIAGNOSIS — R269 Unspecified abnormalities of gait and mobility: Secondary | ICD-10-CM | POA: Diagnosis not present

## 2024-05-09 DIAGNOSIS — R269 Unspecified abnormalities of gait and mobility: Secondary | ICD-10-CM | POA: Diagnosis not present

## 2024-05-12 DIAGNOSIS — R269 Unspecified abnormalities of gait and mobility: Secondary | ICD-10-CM | POA: Diagnosis not present

## 2024-05-16 DIAGNOSIS — R269 Unspecified abnormalities of gait and mobility: Secondary | ICD-10-CM | POA: Diagnosis not present

## 2024-05-23 DIAGNOSIS — R269 Unspecified abnormalities of gait and mobility: Secondary | ICD-10-CM | POA: Diagnosis not present

## 2024-05-26 DIAGNOSIS — R269 Unspecified abnormalities of gait and mobility: Secondary | ICD-10-CM | POA: Diagnosis not present

## 2024-06-02 ENCOUNTER — Ambulatory Visit: Admitting: Family Medicine

## 2024-06-02 VITALS — Ht 66.0 in

## 2024-06-02 VITALS — BP 131/55 | Ht 66.0 in | Wt 150.0 lb

## 2024-06-02 DIAGNOSIS — M810 Age-related osteoporosis without current pathological fracture: Secondary | ICD-10-CM | POA: Diagnosis not present

## 2024-06-02 DIAGNOSIS — R269 Unspecified abnormalities of gait and mobility: Secondary | ICD-10-CM | POA: Diagnosis not present

## 2024-06-02 DIAGNOSIS — M545 Low back pain, unspecified: Secondary | ICD-10-CM

## 2024-06-02 MED ORDER — ROMOSOZUMAB-AQQG 105 MG/1.17ML ~~LOC~~ SOSY
210.0000 mg | PREFILLED_SYRINGE | Freq: Once | SUBCUTANEOUS | Status: AC
  Administered 2024-06-02: 210 mg via SUBCUTANEOUS

## 2024-06-02 NOTE — Patient Instructions (Signed)
 You would benefit from trying Salonpas Lidocaine  patches to your low back.  I placed an order for a Rt hip and pelvis xray to check for arthritis in your right hip.  I also placed an order for an updated MRI of your low back to see if you have any inflammation or swelling in the spine or new pinched nerves.  You can take Tylenol  Extra-strength 500mg  1-2 tabs every 6-hours as needed, max 8-tabs/day.

## 2024-06-02 NOTE — Progress Notes (Signed)
 Patient is here for evenity injection #5. Patient received bilateral arm [place] evenity injections today. She tolerated injections well. She will return in 1 month for her next evenity injection.

## 2024-06-03 ENCOUNTER — Encounter: Payer: Self-pay | Admitting: Family Medicine

## 2024-06-03 ENCOUNTER — Ambulatory Visit
Admission: RE | Admit: 2024-06-03 | Discharge: 2024-06-03 | Disposition: A | Discharge status: Home / Self Care | Source: Ambulatory Visit | Attending: Family Medicine | Admitting: Family Medicine

## 2024-06-03 DIAGNOSIS — M545 Low back pain, unspecified: Secondary | ICD-10-CM

## 2024-06-03 DIAGNOSIS — M1611 Unilateral primary osteoarthritis, right hip: Secondary | ICD-10-CM | POA: Diagnosis not present

## 2024-06-03 DIAGNOSIS — M810 Age-related osteoporosis without current pathological fracture: Secondary | ICD-10-CM

## 2024-06-03 NOTE — Assessment & Plan Note (Signed)
 Ongoing right-sided low back pain, history of osteoporosis undergoing treatment with Evenity , also history of lumbar decompression/laminectomy in 2022, 2 compression fractures in her spine in 2024 with L1 requiring kyphoplasty 04/2023 and L4 which was treated conservatively. - Limited improvement with Tylenol  and physical therapy  Plan: - Previously had L-spine x-ray/8/25 showing status post kyphoplasty at L1 and multilevel degenerative changes and facet disease.  No other significant abnormalities - Given her ongoing symptoms and prior history of compression fracture, we will proceed with MRI to evaluate for any subacute compression fractures or other abnormalities.  Will also check right hip and pelvis x-ray to assess for underlying hip arthritis which could be referring some pain to her right low back as well. - Again encouraged her to use Tylenol  Extra Strength 1-2 tabs every 6-8 hours as needed - Also discussed using over-the-counter Salonpas lidocaine  patches as needed - She will continue her osteoporosis treatment as she is doing - Follow-up with me pending MRI results

## 2024-06-03 NOTE — Assessment & Plan Note (Signed)
 Osteoporosis with history of 2 compression fractures in 2024 with L1 requiring kyphoplasty/2024 and L4 which was treated conservatively  Plan: - Due for Evenity  injection today.  This will be injection 5 out of 12 - Follow-up next month for Evenity  #6 injection as indicated

## 2024-06-03 NOTE — Progress Notes (Signed)
 DATE OF VISIT: 06/02/2024        Brenda Ortiz DOB: 06-09-1941 MRN: [id number]  CC:  f/u low back pain and osteoporosis  History of present Illness: Brenda Ortiz is a 83 y.o. female who presents for a follow-up visit  Last seen by me 05/02/2024 for ongoing right-sided low back pain Past medical history significant for multiple lumbar compression fractures, last over a year ago at L4 Has had trouble tolerating gabapentin  in the past At last visit we discussed trying regimen and Tylenol , she has been doing this intermittently, but a regular basis Continues to have ongoing pain in the right low back Denies any radiation Denies any numbness or tingling Has continued with physical therapy 2 times a week Has been advised by her PT to use a walker for ambulation, currently using a cane.  Plans to start using a walker Denies any new injury or trauma  Also here for her osteoporosis injection today  Medications:  Outpatient Encounter Medications as of 06/02/2024  Medication Sig   calcium -vitamin D  (OSCAL) 250-125 MG-UNIT per tablet Take 1 tablet by mouth at bedtime.    fexofenadine (ALLEGRA) 180 MG tablet Take 180 mg by mouth daily as needed for allergies or rhinitis.   gabapentin  (NEURONTIN ) 100 MG capsule Take 1 capsule (100 mg total) by mouth at bedtime for 3 days. Then take 2 capsules (200 mg) at bedtime for 3 days. Then take 3 capsules (300 mg) at bedtime and stay at that dose.   Multiple Vitamin (MULTIVITAMIN WITH MINERALS) TABS tablet Take 1 tablet by mouth at bedtime.    MYRBETRIQ  50 MG TB24 tablet Take 50 mg by mouth daily.   PREMPRO 0.625-2.5 MG per tablet Take 1 tablet by mouth at bedtime.    ROMOSOZUMAB -AQQG [place] Inject 210 mg into the skin every 30 (thirty) days. Receives monthly injection at [facility]   SYNTHROID  75 MCG tablet Take 75 mcg by mouth daily before breakfast.    No facility-administered encounter medications on file as of 06/02/2024.     Allergies: has no known allergies.  Physical Examination: Vitals: BP (!) 131/55   Ht 5' 6 (1.676 m)   Wt 150 lb (68 kg)   BMI 24.21 kg/m  GENERAL:  Brenda Ortiz is a 83 y.o. female appearing their stated age, alert and oriented x 3, in no apparent distress.  SKIN: no rashes or lesions, skin clean, dry, intact MSK: Lumbar spine without any gross deformity.  No midline tenderness.  Right-sided paraspinal tenderness along L4/L5 and the lumbosacral junction.  Mild tenderness at the right SI joint.  Good range of motion with some pain at terminal flexion and extension.  Lower extremity strength 5 -/5 bilaterally.  Ambulating with assistance of a cane. Neurovascular intact distally  Assessment & Plan Acute right-sided low back pain without sciatica Ongoing right-sided low back pain, history of osteoporosis undergoing treatment with Evenity , also history of lumbar decompression/laminectomy in 2022, 2 compression fractures in her spine in 2024 with L1 requiring kyphoplasty 04/2023 and L4 which was treated conservatively. - Limited improvement with Tylenol  and physical therapy  Plan: - Previously had L-spine x-ray/8/25 showing status post kyphoplasty at L1 and multilevel degenerative changes and facet disease.  No other significant abnormalities - Given her ongoing symptoms and prior history of compression fracture, we will proceed with MRI to evaluate for any subacute compression fractures or other abnormalities.  Will also check right hip and pelvis x-ray to assess for underlying  hip arthritis which could be referring some pain to her right low back as well. - Again encouraged her to use Tylenol  Extra Strength 1-2 tabs every 6-8 hours as needed - Also discussed using over-the-counter Salonpas lidocaine  patches as needed - She will continue her osteoporosis treatment as she is doing - Follow-up with me pending MRI results Age-related osteoporosis without current pathological  fracture Osteoporosis with history of 2 compression fractures in 2024 with L1 requiring kyphoplasty/2024 and L4 which was treated conservatively  Plan: - Due for Evenity  injection today.  This will be injection 5 out of 12 - Follow-up next month for Evenity  #6 injection as indicated   Patient expressed understanding & agreement with above.  Encounter Diagnoses  Name Primary?   Acute right-sided low back pain without sciatica Yes   Age-related osteoporosis without current pathological fracture     Orders Placed This Encounter  Procedures   MR Lumbar Spine Wo Contrast   DG HIP UNILAT WITH PELVIS 2-3 VIEWS RIGHT

## 2024-06-06 ENCOUNTER — Encounter: Payer: Self-pay | Admitting: Family Medicine

## 2024-06-06 DIAGNOSIS — R269 Unspecified abnormalities of gait and mobility: Secondary | ICD-10-CM | POA: Diagnosis not present

## 2024-06-08 ENCOUNTER — Inpatient Hospital Stay
Admission: RE | Admit: 2024-06-08 | Discharge: 2024-06-08 | Discharge status: Home / Self Care | Source: Ambulatory Visit | Attending: Family Medicine | Admitting: Family Medicine

## 2024-06-08 DIAGNOSIS — M51362 Other intervertebral disc degeneration, lumbar region with discogenic back pain and lower extremity pain: Secondary | ICD-10-CM | POA: Diagnosis not present

## 2024-06-08 DIAGNOSIS — M545 Low back pain, unspecified: Secondary | ICD-10-CM

## 2024-06-08 DIAGNOSIS — Z9889 Other specified postprocedural states: Secondary | ICD-10-CM | POA: Diagnosis not present

## 2024-06-08 DIAGNOSIS — M810 Age-related osteoporosis without current pathological fracture: Secondary | ICD-10-CM

## 2024-06-08 DIAGNOSIS — M4856XA Collapsed vertebra, not elsewhere classified, lumbar region, initial encounter for fracture: Secondary | ICD-10-CM | POA: Diagnosis not present

## 2024-06-10 DIAGNOSIS — Z79890 Hormone replacement therapy (postmenopausal): Secondary | ICD-10-CM | POA: Diagnosis not present

## 2024-06-10 DIAGNOSIS — N950 Postmenopausal bleeding: Secondary | ICD-10-CM | POA: Diagnosis not present

## 2024-06-13 ENCOUNTER — Ambulatory Visit: Payer: Self-pay | Admitting: Family Medicine

## 2024-06-13 DIAGNOSIS — R269 Unspecified abnormalities of gait and mobility: Secondary | ICD-10-CM | POA: Diagnosis not present

## 2024-06-16 DIAGNOSIS — R269 Unspecified abnormalities of gait and mobility: Secondary | ICD-10-CM | POA: Diagnosis not present

## 2024-06-20 DIAGNOSIS — E039 Hypothyroidism, unspecified: Secondary | ICD-10-CM | POA: Diagnosis not present

## 2024-06-20 DIAGNOSIS — Z79890 Hormone replacement therapy (postmenopausal): Secondary | ICD-10-CM | POA: Diagnosis not present

## 2024-06-20 DIAGNOSIS — R269 Unspecified abnormalities of gait and mobility: Secondary | ICD-10-CM | POA: Diagnosis not present

## 2024-06-20 DIAGNOSIS — M48062 Spinal stenosis, lumbar region with neurogenic claudication: Secondary | ICD-10-CM | POA: Diagnosis not present

## 2024-06-20 DIAGNOSIS — F4329 Adjustment disorder with other symptoms: Secondary | ICD-10-CM | POA: Diagnosis not present

## 2024-06-20 DIAGNOSIS — S32010D Wedge compression fracture of first lumbar vertebra, subsequent encounter for fracture with routine healing: Secondary | ICD-10-CM | POA: Diagnosis not present

## 2024-06-20 DIAGNOSIS — M810 Age-related osteoporosis without current pathological fracture: Secondary | ICD-10-CM | POA: Diagnosis not present

## 2024-06-20 DIAGNOSIS — I10 Essential (primary) hypertension: Secondary | ICD-10-CM | POA: Diagnosis not present

## 2024-06-25 ENCOUNTER — Other Ambulatory Visit: Payer: Self-pay | Admitting: Family Medicine

## 2024-06-27 ENCOUNTER — Other Ambulatory Visit: Payer: Self-pay

## 2024-06-27 DIAGNOSIS — M25551 Pain in right hip: Secondary | ICD-10-CM

## 2024-06-27 DIAGNOSIS — G8929 Other chronic pain: Secondary | ICD-10-CM

## 2024-06-27 DIAGNOSIS — M545 Low back pain, unspecified: Secondary | ICD-10-CM

## 2024-07-04 ENCOUNTER — Ambulatory Visit: Admitting: Family Medicine

## 2024-07-04 DIAGNOSIS — M810 Age-related osteoporosis without current pathological fracture: Secondary | ICD-10-CM | POA: Diagnosis not present

## 2024-07-04 MED ORDER — ROMOSOZUMAB-AQQG 105 MG/1.17ML ~~LOC~~ SOSY
210.0000 mg | PREFILLED_SYRINGE | Freq: Once | SUBCUTANEOUS | Status: AC
  Administered 2024-07-04: 210 mg via SUBCUTANEOUS

## 2024-07-04 NOTE — Progress Notes (Signed)
Patient is here for evenity injection #6. Patient received bilateral arm [place] evenity injections today. She tolerated injections well. She will return in 1 month for her next evenity injection.

## 2024-07-05 ENCOUNTER — Ambulatory Visit: Admitting: Family Medicine

## 2024-07-05 ENCOUNTER — Other Ambulatory Visit: Payer: Self-pay

## 2024-07-05 ENCOUNTER — Ambulatory Visit (INDEPENDENT_AMBULATORY_CARE_PROVIDER_SITE_OTHER): Admitting: Family Medicine

## 2024-07-05 VITALS — BP 137/64 | Ht 66.0 in | Wt 150.0 lb

## 2024-07-05 DIAGNOSIS — M25551 Pain in right hip: Secondary | ICD-10-CM

## 2024-07-05 DIAGNOSIS — R269 Unspecified abnormalities of gait and mobility: Secondary | ICD-10-CM | POA: Diagnosis not present

## 2024-07-05 DIAGNOSIS — M545 Low back pain, unspecified: Secondary | ICD-10-CM | POA: Diagnosis not present

## 2024-07-05 DIAGNOSIS — M1611 Unilateral primary osteoarthritis, right hip: Secondary | ICD-10-CM

## 2024-07-05 MED ORDER — METHYLPREDNISOLONE ACETATE 40 MG/ML IJ SUSP
40.0000 mg | Freq: Once | INTRAMUSCULAR | Status: AC
  Administered 2024-07-05: 40 mg via INTRA_ARTICULAR

## 2024-07-05 NOTE — Progress Notes (Unsigned)
 DATE OF VISIT: 07/05/2024        Brenda Ortiz DOB: [birth year] MRN: [id number]  CC:  Rt hip injection  History of present Illness: Brenda Ortiz is a 83 y.o. female who presents for a follow-up visit for Rt hip ultrasound guided injection Has known hip OA, also with underlying osteoporosis and prior lumbar compression fx Having pain the posterior hip on the right, as well as the right thigh/groin area Prior LT hip THA - did well with cortisone prior to this Xrays after last visit showing mild OA Has been doing regular PT for the back Started to use a rolling walker for additional support and stability  Medications:  Outpatient Encounter Medications as of 07/05/2024  Medication Sig   calcium -vitamin D  (OSCAL) 250-125 MG-UNIT per tablet Take 1 tablet by mouth at bedtime.    fexofenadine (ALLEGRA) 180 MG tablet Take 180 mg by mouth daily as needed for allergies or rhinitis.   gabapentin  (NEURONTIN ) 100 MG capsule Take 1 capsule (100 mg total) by mouth at bedtime for 3 days. Then take 2 capsules (200 mg) at bedtime for 3 days. Then take 3 capsules (300 mg) at bedtime and stay at that dose.   Multiple Vitamin (MULTIVITAMIN WITH MINERALS) TABS tablet Take 1 tablet by mouth at bedtime.    MYRBETRIQ  50 MG TB24 tablet Take 50 mg by mouth daily.   PREMPRO 0.625-2.5 MG per tablet Take 1 tablet by mouth at bedtime.    ROMOSOZUMAB -AQQG [place] Inject 210 mg into the skin every 30 (thirty) days. Receives monthly injection at [facility]   SYNTHROID  75 MCG tablet Take 75 mcg by mouth daily before breakfast.    [EXPIRED] methylPREDNISolone  acetate (DEPO-MEDROL ) injection 40 mg    No facility-administered encounter medications on file as of 07/05/2024.    Allergies: has no known allergies.  Physical Examination: Vitals: BP 137/64   Ht 5' 6 (1.676 m)   Wt 150 lb (68 kg)   BMI 24.21 kg/m  GENERAL:  Brenda Ortiz is a 83 y.o. female appearing their stated age,  alert and oriented x 3, in no apparent distress.  SKIN: no rashes or lesions, skin clean, dry, intact MSK: Right hip with decreased internal and external rotation with pain in the anterior and posterior aspect of the hip.  Negative logroll.  Left hip with good range of motion without pain.  Walking with the assistance of a rolling walker N/V/I distally   Assessment & Plan  1. Right hip pain 2. Primary osteoarthritis of right hip Rt hip pain along posterior hip and anterior thigh with underlying OA  PLAN: - will proceed with U/S guided right hip injection today.  Was completed as noted below - cont PT as she is doing - f/u 6-weeks to reassess, sooner prn  PROCEDURE:  Risks & benefits of u/s guided RT hip injection reviewed.  Consent obtained.  Time-out completed.  Patient prepped and draped in the normal fashion. Musculoskeletal ultrasound used to identify appropriate anatomy - RT hip joint well visualized.  After identifying appropriate anatomy, patient positioned & area cleansed with chlorhexidine .  Ethyl chloride spray used to anesthetize the skin.  Solution of 3 mL 1% lidocaine  injected under ultrasound guidance for local anesthesia.  After ensuring adequate anesthesia a solution of 4 mL 1% lidocaine  with 1 mL Kenalog  40mg /mL injected into the RT hip joint using a 22-gauge 3.5-inch spinal needle under ultrasound guidance.  Needle well-visualized in the RT hip joint.  Images saved.  Patient tolerated procedure well without any complications.  Area covered with adhesive bandage.  Post-procedure care reviewed.  All questions answered.    Patient expressed understanding & agreement with above.  Encounter Diagnoses  Name Primary?   Right hip pain Yes   Primary osteoarthritis of right hip     Orders Placed This Encounter  Procedures   US  LIMITED JOINT SPACE STRUCTURES LOW RIGHT

## 2024-07-05 NOTE — Patient Instructions (Signed)

## 2024-07-06 ENCOUNTER — Encounter: Payer: Self-pay | Admitting: Family Medicine

## 2024-07-07 DIAGNOSIS — R269 Unspecified abnormalities of gait and mobility: Secondary | ICD-10-CM | POA: Diagnosis not present

## 2024-07-07 DIAGNOSIS — M25551 Pain in right hip: Secondary | ICD-10-CM | POA: Diagnosis not present

## 2024-07-07 DIAGNOSIS — M545 Low back pain, unspecified: Secondary | ICD-10-CM | POA: Diagnosis not present

## 2024-07-12 DIAGNOSIS — M545 Low back pain, unspecified: Secondary | ICD-10-CM | POA: Diagnosis not present

## 2024-07-12 DIAGNOSIS — M25551 Pain in right hip: Secondary | ICD-10-CM | POA: Diagnosis not present

## 2024-07-12 DIAGNOSIS — R269 Unspecified abnormalities of gait and mobility: Secondary | ICD-10-CM | POA: Diagnosis not present

## 2024-07-14 DIAGNOSIS — R269 Unspecified abnormalities of gait and mobility: Secondary | ICD-10-CM | POA: Diagnosis not present

## 2024-07-14 DIAGNOSIS — M545 Low back pain, unspecified: Secondary | ICD-10-CM | POA: Diagnosis not present

## 2024-07-14 DIAGNOSIS — M25551 Pain in right hip: Secondary | ICD-10-CM | POA: Diagnosis not present

## 2024-07-19 DIAGNOSIS — M47816 Spondylosis without myelopathy or radiculopathy, lumbar region: Secondary | ICD-10-CM | POA: Diagnosis not present

## 2024-07-21 DIAGNOSIS — M25551 Pain in right hip: Secondary | ICD-10-CM | POA: Diagnosis not present

## 2024-07-21 DIAGNOSIS — R269 Unspecified abnormalities of gait and mobility: Secondary | ICD-10-CM | POA: Diagnosis not present

## 2024-07-21 DIAGNOSIS — M545 Low back pain, unspecified: Secondary | ICD-10-CM | POA: Diagnosis not present

## 2024-07-25 DIAGNOSIS — M25551 Pain in right hip: Secondary | ICD-10-CM | POA: Diagnosis not present

## 2024-07-25 DIAGNOSIS — R269 Unspecified abnormalities of gait and mobility: Secondary | ICD-10-CM | POA: Diagnosis not present

## 2024-07-25 DIAGNOSIS — M545 Low back pain, unspecified: Secondary | ICD-10-CM | POA: Diagnosis not present

## 2024-07-28 DIAGNOSIS — R269 Unspecified abnormalities of gait and mobility: Secondary | ICD-10-CM | POA: Diagnosis not present

## 2024-07-28 DIAGNOSIS — M545 Low back pain, unspecified: Secondary | ICD-10-CM | POA: Diagnosis not present

## 2024-07-28 DIAGNOSIS — M25551 Pain in right hip: Secondary | ICD-10-CM | POA: Diagnosis not present

## 2024-08-01 DIAGNOSIS — R269 Unspecified abnormalities of gait and mobility: Secondary | ICD-10-CM | POA: Diagnosis not present

## 2024-08-01 DIAGNOSIS — M25551 Pain in right hip: Secondary | ICD-10-CM | POA: Diagnosis not present

## 2024-08-01 DIAGNOSIS — M545 Low back pain, unspecified: Secondary | ICD-10-CM | POA: Diagnosis not present

## 2024-08-04 DIAGNOSIS — M25551 Pain in right hip: Secondary | ICD-10-CM | POA: Diagnosis not present

## 2024-08-04 DIAGNOSIS — M545 Low back pain, unspecified: Secondary | ICD-10-CM | POA: Diagnosis not present

## 2024-08-04 DIAGNOSIS — R269 Unspecified abnormalities of gait and mobility: Secondary | ICD-10-CM | POA: Diagnosis not present

## 2024-08-04 DIAGNOSIS — M47817 Spondylosis without myelopathy or radiculopathy, lumbosacral region: Secondary | ICD-10-CM | POA: Diagnosis not present

## 2024-08-05 ENCOUNTER — Ambulatory Visit: Admitting: Family Medicine

## 2024-08-05 DIAGNOSIS — M810 Age-related osteoporosis without current pathological fracture: Secondary | ICD-10-CM

## 2024-08-05 MED ORDER — ROMOSOZUMAB-AQQG 105 MG/1.17ML ~~LOC~~ SOSY
210.0000 mg | PREFILLED_SYRINGE | Freq: Once | SUBCUTANEOUS | Status: AC
  Administered 2024-08-05: 210 mg via SUBCUTANEOUS

## 2024-08-05 NOTE — Progress Notes (Signed)
 Patient is here for evenity injection #7. Patient received bilateral arm [place] evenity injections today. She tolerated injections well. She will return in 1 month for her next evenity injection.

## 2024-08-08 DIAGNOSIS — M545 Low back pain, unspecified: Secondary | ICD-10-CM | POA: Diagnosis not present

## 2024-08-08 DIAGNOSIS — M25551 Pain in right hip: Secondary | ICD-10-CM | POA: Diagnosis not present

## 2024-08-08 DIAGNOSIS — R269 Unspecified abnormalities of gait and mobility: Secondary | ICD-10-CM | POA: Diagnosis not present

## 2024-08-11 DIAGNOSIS — M545 Low back pain, unspecified: Secondary | ICD-10-CM | POA: Diagnosis not present

## 2024-08-11 DIAGNOSIS — M25551 Pain in right hip: Secondary | ICD-10-CM | POA: Diagnosis not present

## 2024-08-11 DIAGNOSIS — R269 Unspecified abnormalities of gait and mobility: Secondary | ICD-10-CM | POA: Diagnosis not present

## 2024-08-15 DIAGNOSIS — M545 Low back pain, unspecified: Secondary | ICD-10-CM | POA: Diagnosis not present

## 2024-08-15 DIAGNOSIS — R269 Unspecified abnormalities of gait and mobility: Secondary | ICD-10-CM | POA: Diagnosis not present

## 2024-08-15 DIAGNOSIS — M25551 Pain in right hip: Secondary | ICD-10-CM | POA: Diagnosis not present

## 2024-08-16 ENCOUNTER — Ambulatory Visit: Admitting: Sports Medicine

## 2024-08-18 ENCOUNTER — Encounter: Payer: Self-pay | Admitting: Family Medicine

## 2024-08-18 ENCOUNTER — Ambulatory Visit: Admitting: Family Medicine

## 2024-08-18 VITALS — BP 138/58 | Ht 66.0 in | Wt 150.0 lb

## 2024-08-18 DIAGNOSIS — R5383 Other fatigue: Secondary | ICD-10-CM | POA: Diagnosis not present

## 2024-08-18 DIAGNOSIS — M25551 Pain in right hip: Secondary | ICD-10-CM | POA: Diagnosis not present

## 2024-08-18 DIAGNOSIS — M1611 Unilateral primary osteoarthritis, right hip: Secondary | ICD-10-CM | POA: Diagnosis not present

## 2024-08-18 DIAGNOSIS — M810 Age-related osteoporosis without current pathological fracture: Secondary | ICD-10-CM | POA: Diagnosis not present

## 2024-08-18 DIAGNOSIS — G8929 Other chronic pain: Secondary | ICD-10-CM

## 2024-08-18 DIAGNOSIS — R269 Unspecified abnormalities of gait and mobility: Secondary | ICD-10-CM | POA: Diagnosis not present

## 2024-08-18 DIAGNOSIS — M545 Low back pain, unspecified: Secondary | ICD-10-CM

## 2024-08-18 NOTE — Progress Notes (Signed)
 DATE OF VISIT: 08/18/2024        Brenda Ortiz DOB: [birth year] MRN: [id number]  CC: Follow-up right hip pain and low back pain  History of present Illness: Brenda Ortiz is a 83 y.o. female who presents for a follow-up visit. Last seen by me 07/05/2024 and underwent ultrasound-guided right hip injection for known hip OA Feeling 50+% better after the injection Has continued to have some discomfort in the right low back, but thinks the injection was helpful She does continue to do her physical therapy twice a week Has been using a rolling walker for added support and stability She notes that she has been having increasing issues with energy and fatigue - Has been sleeping more than normal over the last 1 to 2 months - Feels that her mood has been somewhat down - Seen by PCP 06/20/2024 was started on Lexapro 5 mg p.o. daily.  She thinks it has been somewhat helpful - Does not have any upcoming follow-up with PCP for several months She does have history of underlying hypothyroid, has been taking her Synthroid  as prescribed.  Last TSH in February was normal. Last labs with us  February 2025 were normal She thinks she may have had labs at her visit with PCP 06/20/24, but not certain  Medications:  Outpatient Encounter Medications as of 08/18/2024  Medication Sig   calcium -vitamin D  (OSCAL) 250-125 MG-UNIT per tablet Take 1 tablet by mouth at bedtime.    escitalopram (LEXAPRO) 5 MG tablet Take 5 mg by mouth daily.   fexofenadine (ALLEGRA) 180 MG tablet Take 180 mg by mouth daily as needed for allergies or rhinitis.   gabapentin  (NEURONTIN ) 100 MG capsule Take 1 capsule (100 mg total) by mouth at bedtime for 3 days. Then take 2 capsules (200 mg) at bedtime for 3 days. Then take 3 capsules (300 mg) at bedtime and stay at that dose.   Multiple Vitamin (MULTIVITAMIN WITH MINERALS) TABS tablet Take 1 tablet by mouth at bedtime.    MYRBETRIQ  50 MG TB24 tablet Take 50 mg by mouth daily.   PREMPRO  0.625-2.5 MG per tablet Take 1 tablet by mouth at bedtime.    ROMOSOZUMAB -AQQG  Inject 210 mg into the skin every 30 (thirty) days. Receives monthly injection at [facility]   SYNTHROID  75 MCG tablet Take 75 mcg by mouth daily before breakfast.    No facility-administered encounter medications on file as of 08/18/2024.    Allergies: has no known allergies.  Physical Examination: Vitals: BP (!) 138/58   Ht 5' 6 (1.676 m)   Wt 150 lb (68 kg)   BMI 24.21 kg/m  GENERAL:  Brenda Ortiz is a 83 y.o. female appearing their stated age, alert and oriented x 3, in no apparent distress.  MSK: Lumbar spine without any gross deformity.  No midline tenderness.  Mild right-sided paraspinal tenderness at the SI joint.  Good range of motion without pain. Right hip with decreased internal and external rotation without any pain today.  Left hip with good range of motion without pain.  Lower extremity strength 4/5 throughout.  Walking with the assistance of a rolling walker Neurovascular intact distally  Assessment & Plan  1. Primary osteoarthritis of right hip 2. Chronic right-sided low back pain without sciatica Chronic right-sided low back pain and associated right hip osteoarthritis, greatly improved status post intra-articular hip injection last visit.  Still some ongoing pain  Plan: - Should continue with PT as she is  doing - Could consider repeat injection in the future if needed - Can follow-up as needed  3. Age-related osteoporosis without current pathological fracture Should continue with her osteoporosis treatment with Evenity  as she is doing  Plan: - Should keep next appointment as scheduled 09/06/2024 for Evenity  No. 8 injection - Continue calcium  and vitamin D   4. Decreased energy 5. Fatigue, unspecified type Complaints of increasing fatigue and low energy over the last 1 to 2 months.  Uncertain etiology  Plan: - Reviewed visit notes with PCP from  06/20/24, appear to be routine visit - Last labs from February 2025 were within range, she thinks she may have had labs with last PCP visit, but they are not available for review in epic today - There may be a mood component, as she was started on Lexapro 5 mg daily at last visit with her PCP - Recommend that she contact PCP office to schedule follow-up within the next 1 to 2 weeks to further discuss.  She may require some updated labs/blood work or other testing. - She will continue her current treatment regimen with other medications as prescribed by PCP in the interim   Patient expressed understanding & agreement with above.  Encounter Diagnoses  Name Primary?   Primary osteoarthritis of right hip Yes   Chronic right-sided low back pain without sciatica    Age-related osteoporosis without current pathological fracture    Decreased energy    Fatigue, unspecified type     No orders of the defined types were placed in this encounter.

## 2024-09-06 ENCOUNTER — Ambulatory Visit: Admitting: Family Medicine

## 2024-09-06 VITALS — Ht 66.0 in

## 2024-09-06 DIAGNOSIS — M810 Age-related osteoporosis without current pathological fracture: Secondary | ICD-10-CM

## 2024-09-06 MED ORDER — ROMOSOZUMAB-AQQG 105 MG/1.17ML ~~LOC~~ SOSY
210.0000 mg | PREFILLED_SYRINGE | Freq: Once | SUBCUTANEOUS | Status: AC
  Administered 2024-09-06: 210 mg via SUBCUTANEOUS

## 2024-09-06 NOTE — Progress Notes (Unsigned)
Patient is here for evenity injection #8. Patient received bilateral arm [place] evenity injections today. She tolerated injections well. She will return in 1 month for her next evenity injection.

## 2024-09-15 DIAGNOSIS — M25551 Pain in right hip: Secondary | ICD-10-CM | POA: Diagnosis not present

## 2024-09-15 DIAGNOSIS — M545 Low back pain, unspecified: Secondary | ICD-10-CM | POA: Diagnosis not present

## 2024-09-15 DIAGNOSIS — R269 Unspecified abnormalities of gait and mobility: Secondary | ICD-10-CM | POA: Diagnosis not present

## 2024-09-19 DIAGNOSIS — M545 Low back pain, unspecified: Secondary | ICD-10-CM | POA: Diagnosis not present

## 2024-09-19 DIAGNOSIS — M25551 Pain in right hip: Secondary | ICD-10-CM | POA: Diagnosis not present

## 2024-09-19 DIAGNOSIS — R269 Unspecified abnormalities of gait and mobility: Secondary | ICD-10-CM | POA: Diagnosis not present

## 2024-09-22 DIAGNOSIS — M25551 Pain in right hip: Secondary | ICD-10-CM | POA: Diagnosis not present

## 2024-09-22 DIAGNOSIS — R269 Unspecified abnormalities of gait and mobility: Secondary | ICD-10-CM | POA: Diagnosis not present

## 2024-09-22 DIAGNOSIS — M545 Low back pain, unspecified: Secondary | ICD-10-CM | POA: Diagnosis not present

## 2024-09-26 DIAGNOSIS — M25551 Pain in right hip: Secondary | ICD-10-CM | POA: Diagnosis not present

## 2024-09-26 DIAGNOSIS — R269 Unspecified abnormalities of gait and mobility: Secondary | ICD-10-CM | POA: Diagnosis not present

## 2024-09-26 DIAGNOSIS — M545 Low back pain, unspecified: Secondary | ICD-10-CM | POA: Diagnosis not present

## 2024-09-30 DIAGNOSIS — M545 Low back pain, unspecified: Secondary | ICD-10-CM | POA: Diagnosis not present

## 2024-09-30 DIAGNOSIS — R269 Unspecified abnormalities of gait and mobility: Secondary | ICD-10-CM | POA: Diagnosis not present

## 2024-09-30 DIAGNOSIS — M25551 Pain in right hip: Secondary | ICD-10-CM | POA: Diagnosis not present

## 2024-10-10 DIAGNOSIS — M533 Sacrococcygeal disorders, not elsewhere classified: Secondary | ICD-10-CM | POA: Diagnosis not present

## 2024-10-11 ENCOUNTER — Ambulatory Visit: Admitting: Family Medicine

## 2024-10-11 ENCOUNTER — Encounter: Payer: Self-pay | Admitting: Family Medicine

## 2024-10-11 VITALS — BP 150/70 | Ht 66.0 in | Wt 150.0 lb

## 2024-10-11 DIAGNOSIS — M810 Age-related osteoporosis without current pathological fracture: Secondary | ICD-10-CM

## 2024-10-11 DIAGNOSIS — R5383 Other fatigue: Secondary | ICD-10-CM

## 2024-10-11 DIAGNOSIS — M545 Low back pain, unspecified: Secondary | ICD-10-CM | POA: Diagnosis not present

## 2024-10-11 DIAGNOSIS — G8929 Other chronic pain: Secondary | ICD-10-CM

## 2024-10-11 DIAGNOSIS — M1611 Unilateral primary osteoarthritis, right hip: Secondary | ICD-10-CM | POA: Diagnosis not present

## 2024-10-11 MED ORDER — ROMOSOZUMAB-AQQG 105 MG/1.17ML ~~LOC~~ SOSY
210.0000 mg | PREFILLED_SYRINGE | Freq: Once | SUBCUTANEOUS | Status: AC
  Administered 2024-10-11: 210 mg via SUBCUTANEOUS

## 2024-10-11 NOTE — Progress Notes (Signed)
 DATE OF VISIT: 10/11/2024        BIVIANA Ortiz DOB: 04-Sep-1941 MRN: [id number]  Discussed the use of AI scribe software for clinical note transcription with the patient, who gave verbal consent to proceed.  History of Present Illness Brenda Ortiz is an 83 year old female with osteoporosis and chronic low back pain who presents for follow-up and her Evenity  injection  with sacroiliac pain. She was referred by her cousin, a retired marine scientist, for an opinion at an osteoporosis center.  RT-sided Sacroiliac pain - Persistent pain localized to the sacroiliac region - Pain is centralized and has been present for an extended period - Significantly interferes with daily activities - Previous back injections have not provided relief] - Seen by interventional radiologist with [facility] yesterday at the recommendation of her cousin who is a retired marine scientist.  Will be scheduled for a image guided SI joint injection, still working this out with them  Osteoporosis management - Currently undergoing treatment with Evenity  - Due for ninth dose of Evenity  today - Three doses remain to complete the treatment course  Fatigue - Significant fatigue requiring return to sleep shortly after waking - Fatigue is atypical given her usual activity level - Lexapro 5 mg prescribed by her primary care provider for fatigue, though she is uncertain of the exact medication name  Mobility impairment - Uses a walker for ambulation - Vannie is inconvenient and sometimes intrusive when others offer assistance - Appreciates the kindness from others when help is offered    Medications:  Outpatient Encounter Medications as of 10/11/2024  Medication Sig   calcium -vitamin D  (OSCAL) 250-125 MG-UNIT per tablet Take 1 tablet by mouth at bedtime.    escitalopram (LEXAPRO) 5 MG tablet Take 5 mg by mouth daily.   fexofenadine (ALLEGRA) 180 MG tablet Take 180 mg by mouth daily as needed for allergies or  rhinitis.   gabapentin  (NEURONTIN ) 100 MG capsule Take 1 capsule (100 mg total) by mouth at bedtime for 3 days. Then take 2 capsules (200 mg) at bedtime for 3 days. Then take 3 capsules (300 mg) at bedtime and stay at that dose.   Multiple Vitamin (MULTIVITAMIN WITH MINERALS) TABS tablet Take 1 tablet by mouth at bedtime.    MYRBETRIQ  50 MG TB24 tablet Take 50 mg by mouth daily.   PREMPRO 0.625-2.5 MG per tablet Take 1 tablet by mouth at bedtime.    ROMOSOZUMAB -AQQG [place] Inject 210 mg into the skin every 30 (thirty) days. Receives monthly injection at [facility]   SYNTHROID  75 MCG tablet Take 75 mcg by mouth daily before breakfast.    No facility-administered encounter medications on file as of 10/11/2024.    Allergies: has no known allergies.  Physical Examination: Vitals: BP (!) 150/70   Ht 5' 6 (1.676 m)   Wt 150 lb (68 kg)   BMI 24.21 kg/m  GENERAL:  Brenda Ortiz is a 83 y.o. female appearing their stated age, alert and oriented x 3, in no apparent distress.  MSK: Lumbar spine without gross deformity.  Has mild tenderness palpation along the right paraspinal muscles in the right SI joint.  Ambulating with the assistance of a walker.  Normal gross lower extremity strength Neurovascular intact distally Assessment & Plan Chronic right-sided low back pain and associated right hip osteoarthritis, now with persistent sided sacroiliac joint pain.  - Notes from IR visit with Dr. Philippe Cava today reviewed from the visit today - I do recommend  she proceed with injection under fluoroscopy or CT guidance with them to see if this can help her SI joint pain - Monitor pain relief duration -She will update me after the procedure  Age-related osteoporosis without current pathological fracture Ongoing Evenity  treatment to enhance bone density. Ninth dose to be administered today. - Plan follow-up DEXA scan in March 2026 after completing course of Evenity  to  assess bone density. - Continue Evenity  treatment with remaining doses.  Fatigue, unspecified type Ongoing fatigue and low energy for several months, seen by PCP recently thought to be mood related - Continue Lexapro as prescribed by PCP - Continue to follow-up with PCP as scheduled - Hopefully improved pain relief with trial of SI joint injection will help her mood and fatigue as well     Patient expressed understanding & agreement with above.  Encounter Diagnoses  Name Primary?   Age related osteoporosis, unspecified pathological fracture presence Yes   Chronic right-sided low back pain without sciatica    Primary osteoarthritis of right hip    Fatigue, unspecified type     No orders of the defined types were placed in this encounter.    Contains text generated by [employer].

## 2024-10-11 NOTE — Progress Notes (Signed)
 Patient is here for evenity injection #9. Patient received bilateral arm [place] evenity injections today. She tolerated injections well. She will return in 1 month for her next evenity injection.

## 2024-10-17 DIAGNOSIS — R269 Unspecified abnormalities of gait and mobility: Secondary | ICD-10-CM | POA: Diagnosis not present

## 2024-10-17 DIAGNOSIS — M545 Low back pain, unspecified: Secondary | ICD-10-CM | POA: Diagnosis not present

## 2024-10-17 DIAGNOSIS — M25551 Pain in right hip: Secondary | ICD-10-CM | POA: Diagnosis not present

## 2024-10-26 DIAGNOSIS — M25551 Pain in right hip: Secondary | ICD-10-CM | POA: Diagnosis not present

## 2024-10-26 DIAGNOSIS — R269 Unspecified abnormalities of gait and mobility: Secondary | ICD-10-CM | POA: Diagnosis not present

## 2024-10-26 DIAGNOSIS — M545 Low back pain, unspecified: Secondary | ICD-10-CM | POA: Diagnosis not present

## 2024-11-04 NOTE — Telephone Encounter (Signed)
 2026 Evenity  benefits due to re-verify on [employer].

## 2024-11-15 ENCOUNTER — Ambulatory Visit: Admitting: Family Medicine

## 2024-11-15 DIAGNOSIS — M810 Age-related osteoporosis without current pathological fracture: Secondary | ICD-10-CM | POA: Diagnosis not present

## 2024-11-15 MED ORDER — ROMOSOZUMAB-AQQG 105 MG/1.17ML ~~LOC~~ SOSY
210.0000 mg | PREFILLED_SYRINGE | Freq: Once | SUBCUTANEOUS | Status: AC
  Administered 2024-11-15: 210 mg via SUBCUTANEOUS

## 2024-11-15 NOTE — Progress Notes (Signed)
 Patient is here for evenity injection #10. Patient received bilateral arm [place] evenity injections today. She tolerated injections well. She will return in 1 month for her next evenity injection

## 2024-11-23 NOTE — Telephone Encounter (Addendum)
 Patient is ready for scheduling on or after:12/19/24 BUY AND BILL  Patient has # 2 remaining Evenity  injections.   Out-of-pocket cost due at time of visit: $0Evenity  and administration will be covered at 100%. No deductible, coinsurance or out of pocket max applies.  Primary: [employer]  Evenity  co-insurance: 20% (approximately $507) Admin fee co-insurance: 20% (approximately $25)   Deductible: n/a  Prior Auth: Approved Ref #: [id number] Valid: 11/17/24 - 11/16/25 Clinicals faxed to [phone]   ** This summary of benefits is an estimation of the patient's out-of-pocket cost. Exact cost may vary based on individual plan coverage.

## 2024-12-05 ENCOUNTER — Ambulatory Visit: Admitting: Family Medicine

## 2024-12-05 ENCOUNTER — Encounter: Payer: Self-pay | Admitting: Family Medicine

## 2024-12-05 VITALS — Ht 66.0 in | Wt 150.0 lb

## 2024-12-05 DIAGNOSIS — M545 Low back pain, unspecified: Secondary | ICD-10-CM | POA: Diagnosis not present

## 2024-12-05 DIAGNOSIS — M810 Age-related osteoporosis without current pathological fracture: Secondary | ICD-10-CM | POA: Diagnosis not present

## 2024-12-05 DIAGNOSIS — M1611 Unilateral primary osteoarthritis, right hip: Secondary | ICD-10-CM

## 2024-12-05 DIAGNOSIS — G8929 Other chronic pain: Secondary | ICD-10-CM

## 2024-12-05 NOTE — Progress Notes (Signed)
 DATE OF VISIT: 12/05/2024        Brenda Ortiz DOB: August 12, 1941 MRN: [id number]  Discussed the use of AI scribe software for clinical note transcription with the patient, who gave verbal consent to proceed.  History of Present Illness Brenda Ortiz is an 84 year old female with osteoporosis, prior lumbar compression fracture status post kyphoplasty, and right hip osteoarthritis who presents for follow-up of chronic right-sided low back and hip pain. Here with her husband Elenore) today  Chronic Low Back and Right Hip Pain: - Persistent, progressively worsening right-sided low back and right hip pain - Pain is excruciating upon standing and frequently disrupts sleep - Discomfort is present throughout the day with minimal relief - Concern regarding persistence and escalation of symptoms - CT-guided right sacroiliac joint injection performed on 11/01/2024 with uncertain benefit (performed by Dr Deidra at [facility]) - Previous RT hip ultrasound-guided injections provided partial relief of hip pain but no improvement in low back pain (last was 07/05/24 by me) - Does not take anything regularly for pain.  Very intermittent use of Advil or Tylenol  for pain, but rarely uses - Gabapentin  taken at bedtime, limited to 100 mg due to nausea and grogginess with higher doses  Gait and Balance Impairment: - Several recent falls, including one while getting out of bed and another while leaving a grocery store within the past 1-2 weeks - Able to recover independently without requiring medical attention - Uses a walker for ambulation - Shuffling gait with short stride, attributed by family to pain and balance difficulties - Attends physical therapy for balance but continues to have significant impairment despite daily exercises - Focused on maintaining current level of function and preventing further decline  Osteoporosis and Fracture Risk: - Ongoing monthly Evenity  injections for  osteoporosis, with two doses remaining - Osteoporosis regimen confirmed as appropriate by osteoporosis clinic through [facility].  She was seen there 11/22/2024 and they recommended she continue to follow with us  for ongoing treatment. - History of lumbar compression fracture status post kyphoplasty - Concern that recent increase in pain may be due to another compression fracture    Medications:  Outpatient Encounter Medications as of 12/05/2024  Medication Sig   calcium -vitamin D  (OSCAL) 250-125 MG-UNIT per tablet Take 1 tablet by mouth at bedtime.    escitalopram (LEXAPRO) 5 MG tablet Take 5 mg by mouth daily.   fexofenadine (ALLEGRA) 180 MG tablet Take 180 mg by mouth daily as needed for allergies or rhinitis.   gabapentin  (NEURONTIN ) 100 MG capsule Take 1 capsule (100 mg total) by mouth at bedtime for 3 days. Then take 2 capsules (200 mg) at bedtime for 3 days. Then take 3 capsules (300 mg) at bedtime and stay at that dose.   Multiple Vitamin (MULTIVITAMIN WITH MINERALS) TABS tablet Take 1 tablet by mouth at bedtime.    MYRBETRIQ  50 MG TB24 tablet Take 50 mg by mouth daily.   PREMPRO 0.625-2.5 MG per tablet Take 1 tablet by mouth at bedtime.    ROMOSOZUMAB -AQQG [place] Inject 210 mg into the skin every 30 (thirty) days. Receives monthly injection at [facility]   SYNTHROID  75 MCG tablet Take 75 mcg by mouth daily before breakfast.    No facility-administered encounter medications on file as of 12/05/2024.    Allergies: has no known allergies.  Physical Examination: Vitals: Ht 5' 6 (1.676 m)   Wt 150 lb (68 kg)   BMI 24.21 kg/m  GENERAL:  Brenda Ortiz is a 84 y.o. female appearing their stated age, alert and oriented x 3, in no apparent distress.  MSK:  L-spine: No gross deformity.  No midline tenderness.  Mild right-sided paraspinal tenderness at the lumbosacral junction and over the SI joint.  Decreased range of motion with pain.  Negative straight leg raise  bilaterally.  Lower extremity strength 4/5 bilaterally. Hips: Bilateral hips with good range of motion without pain.  No tenderness over the anterior hip or the greater trochanters.  Hip strength 4/5 bilaterally. Ambulating with the assistance of a walker Is slow with rising from seated position, needs 1 assist to get onto the exam room table NEURO: sensation intact to light touch, DTR 2/4 Achilles and patella bilaterally VASC: no edema  Assessment & Plan Chronic right-sided low back pain without sciatica and associated right hip osteoarthritis, now with worsening pain Chronic low back pain acutely worsening, causing nocturnal symptoms and discomfort with standing. Concern for new vertebral injury due to recent falls and prior lumbar compression fracture. Previous sacroiliac joint injection ineffective. Under evaluation by pain management and spine specialists. Physical therapy focused on balance and mobility. - Ordered STAT lumbar spine x-ray and pelvis x-ray to assess for new compression fracture or vertebral changes. - Recommended acetaminophen  1000 mg up to three to four times daily as needed for pain. - Advised continuation of current gabapentin  100mg  at bedtime, avoiding dose escalation due to side effects - Instructed to follow up with spine specialist (Dr Darlis with [facility]) to discuss further procedural options, including possible candidacy for Intracept procedure.  Review of visit notes with his PA from 11/03/24 state they may have additional treatment options.  They will call them to schedule a follow-up. - Encouraged continuation of physical therapy for balance and mobility.  Primary osteoarthritis of right hip Right hip osteoarthritis with prior injection providing partial relief; current symptoms dominated by low back pain. - will obtain updated pelvis xray to further evaluate, but suspect back is likely contributing to her symptoms the most  Age-related  osteoporosis without current pathological fracture Osteoporosis managed with monthly romosozumab  injections. Recent falls but no new fractures. Bone density reassessment planned post-treatment. - Continued monthly romosozumab  injections; two doses remaining. - Plan updated bone density scan approximately one month after completion of Evenity  course. - Encouraged discussion of balance concerns with primary care provider at upcoming physical.     Patient expressed understanding & agreement with above.  Encounter Diagnoses  Name Primary?   Chronic right-sided low back pain without sciatica Yes   Primary osteoarthritis of right hip    Age-related osteoporosis without current pathological fracture     Orders Placed This Encounter  Procedures   DG Lumbar Spine 2-3 Views   DG Pelvis 1-2 Views     Contains text generated by Abridge.

## 2024-12-05 NOTE — Patient Instructions (Addendum)
" °  VISIT SUMMARY: Brenda Ortiz, an 84 year old female with a history of osteoporosis, lumbar compression fracture, and right hip osteoarthritis, visited for follow-up on chronic right-sided low back and hip pain. She reported progressively worsening pain that disrupts her sleep and daily activities. She also mentioned recent falls and ongoing balance issues despite physical therapy. Her osteoporosis is being managed with monthly injections, and there is concern about a potential new compression fracture.  YOUR PLAN: -CHRONIC RIGHT-SIDED LOW BACK PAIN WITHOUT SCIATICA: Chronic low back pain can be due to various reasons, including previous injuries or degenerative changes in the spine. You are experiencing worsening pain that affects your sleep and daily activities.  - We have ordered a lumbar spine x-ray to check for any new fractures or changes.  - You should take acetaminophen  (Tylenol ) extra-strength 500mg  take 1-2 tabs by mouth up to three to four times daily as needed for pain  - continue your current dose of gabapentin  at bedtime.  - Please follow up with your spine specialist (Dr Darlis) to discuss further treatment options, including the possibility of an Intracept procedure or other treatments as they mentioned in their last office visit note.  - Continue with physical therapy to help with balance and mobility.  -PRIMARY OSTEOARTHRITIS OF RIGHT HIP: Osteoarthritis is a condition where the cartilage in the joints wears down over time, causing pain and stiffness. Your right hip osteoarthritis has been partially relieved by previous injections, but your current symptoms are mainly due to low back pain.  -AGE-RELATED OSTEOPOROSIS WITHOUT CURRENT PATHOLOGICAL FRACTURE: Osteoporosis is a condition where bones become weak and brittle, increasing the risk of fractures. You are receiving monthly romosozumab  injections to manage your osteoporosis, and you have two doses remaining. We will plan an  updated bone density scan about one month after you complete the Evenity  course. Please discuss any balance concerns with your primary care provider at your upcoming physical.  INSTRUCTIONS: - We have ordered a lumbar spine x-ray to check for any new fractures or changes.  - You should take acetaminophen  (Tylenol ) extra-strength 500mg  take 1-2 tabs by mouth up to three to four times daily as needed for pain  - continue your current dose of gabapentin  at bedtime.  - Please follow up with your spine specialist (Dr Darlis) to discuss further treatment options, including the possibility of an Intracept procedure or other treatments as they mentioned in their last office visit note.  - Continue with physical therapy to help with balance and mobility. - Be sure to discuss your balance concerns with your primary care provider at your upcoming physical.  - We will plan an updated bone density scan about one month after you complete the Evenity  course.   Contains text generated by Abridge.   "

## 2024-12-06 ENCOUNTER — Ambulatory Visit: Payer: Self-pay | Admitting: Family Medicine

## 2024-12-06 ENCOUNTER — Ambulatory Visit
Admission: RE | Admit: 2024-12-06 | Discharge: 2024-12-06 | Disposition: A | Source: Ambulatory Visit | Attending: Family Medicine | Admitting: Family Medicine

## 2024-12-06 DIAGNOSIS — M1611 Unilateral primary osteoarthritis, right hip: Secondary | ICD-10-CM

## 2024-12-06 DIAGNOSIS — G8929 Other chronic pain: Secondary | ICD-10-CM

## 2024-12-06 DIAGNOSIS — M810 Age-related osteoporosis without current pathological fracture: Secondary | ICD-10-CM

## 2024-12-06 NOTE — Progress Notes (Signed)
 X-rays reviewed.  Message sent to staff to call to review results.

## 2024-12-06 NOTE — Progress Notes (Signed)
 Lumbar spine x-rays and pelvis x-rays from 12/06/2024 reviewed.  Has stable old L4 compression fracture and signs of prior L1 surgery.  No other changes noted.  Her pelvis x-ray showed stable osteoarthritis changes in her left hip replacement also remains stable.  ----------------------------------------- Du[place]aine- Please call patient.  Let her know that her x-rays show no new changes or abnormalities.  She has stable old compression fracture at L4 and prior area of her surgery looks good.  Based on these findings, our evaluation yesterday, review of visit notes with spine specialist, I recommend that she contact Dr. Sunnie office for follow-up evaluation per visit notes with them on 11/03/2024.  She should continue to follow-up with us  for her Evenity  treatments, otherwise can follow-up with me as needed. Thanks, [employer]

## 2024-12-20 ENCOUNTER — Ambulatory Visit: Admitting: Family Medicine

## 2024-12-22 ENCOUNTER — Ambulatory Visit: Admitting: Family Medicine

## 2024-12-27 ENCOUNTER — Ambulatory Visit: Admitting: Family Medicine
